# Patient Record
Sex: Male | Born: 1945
Health system: Southern US, Community
[De-identification: ages and names within clinical notes are randomized; demographics above are authoritative.]

## PROBLEM LIST (undated history)

## (undated) DIAGNOSIS — R918 Other nonspecific abnormal finding of lung field: Secondary | ICD-10-CM

## (undated) DIAGNOSIS — F0150 Vascular dementia without behavioral disturbance: Secondary | ICD-10-CM

## (undated) DIAGNOSIS — F3164 Bipolar disorder, current episode mixed, severe, with psychotic features: Secondary | ICD-10-CM

## (undated) DIAGNOSIS — R4189 Other symptoms and signs involving cognitive functions and awareness: Secondary | ICD-10-CM

## (undated) DIAGNOSIS — G20A1 Parkinson's disease without dyskinesia, without mention of fluctuations: Secondary | ICD-10-CM

## (undated) DIAGNOSIS — I10 Essential (primary) hypertension: Secondary | ICD-10-CM

## (undated) DIAGNOSIS — F419 Anxiety disorder, unspecified: Secondary | ICD-10-CM

## (undated) DIAGNOSIS — L570 Actinic keratosis: Secondary | ICD-10-CM

## (undated) DIAGNOSIS — F329 Major depressive disorder, single episode, unspecified: Secondary | ICD-10-CM

## (undated) DIAGNOSIS — J309 Allergic rhinitis, unspecified: Secondary | ICD-10-CM

## (undated) DIAGNOSIS — Z77098 Contact with and (suspected) exposure to other hazardous, chiefly nonmedicinal, chemicals: Secondary | ICD-10-CM

## (undated) DIAGNOSIS — G969 Disorder of central nervous system, unspecified: Secondary | ICD-10-CM

## (undated) DIAGNOSIS — K591 Functional diarrhea: Secondary | ICD-10-CM

## (undated) DIAGNOSIS — G2581 Restless legs syndrome: Secondary | ICD-10-CM

## (undated) DIAGNOSIS — R300 Dysuria: Secondary | ICD-10-CM

## (undated) DIAGNOSIS — G4700 Insomnia, unspecified: Secondary | ICD-10-CM

## (undated) DIAGNOSIS — Z7729 Contact with and (suspected ) exposure to other hazardous substances: Secondary | ICD-10-CM

## (undated) DIAGNOSIS — N400 Enlarged prostate without lower urinary tract symptoms: Secondary | ICD-10-CM

## (undated) DIAGNOSIS — G4730 Sleep apnea, unspecified: Secondary | ICD-10-CM

## (undated) DIAGNOSIS — E785 Hyperlipidemia, unspecified: Secondary | ICD-10-CM

## (undated) DIAGNOSIS — G2571 Drug induced akathisia: Secondary | ICD-10-CM

## (undated) DIAGNOSIS — F32A Depression, unspecified: Secondary | ICD-10-CM

## (undated) HISTORY — PX: TONSILLECTOMY AND ADENOIDECTOMY: SUR1326

## (undated) HISTORY — PX: OTHER SURGICAL HISTORY: SHX169

## (undated) HISTORY — PX: CATARACT EXTRACTION: SUR2

---

## 2011-03-25 ENCOUNTER — Emergency Department (HOSPITAL_COMMUNITY): Payer: Medicare Other

## 2011-03-25 ENCOUNTER — Observation Stay (HOSPITAL_COMMUNITY)
Admission: EM | Admit: 2011-03-25 | Discharge: 2011-03-27 | Disposition: A | Discharge status: Home / Self Care | Payer: Medicare Other | Attending: Internal Medicine | Admitting: Internal Medicine

## 2011-03-25 ENCOUNTER — Encounter: Payer: Self-pay | Admitting: Emergency Medicine

## 2011-03-25 DIAGNOSIS — J4489 Other specified chronic obstructive pulmonary disease: Secondary | ICD-10-CM | POA: Insufficient documentation

## 2011-03-25 DIAGNOSIS — R55 Syncope and collapse: Principal | ICD-10-CM | POA: Diagnosis present

## 2011-03-25 DIAGNOSIS — E785 Hyperlipidemia, unspecified: Secondary | ICD-10-CM | POA: Insufficient documentation

## 2011-03-25 DIAGNOSIS — I10 Essential (primary) hypertension: Secondary | ICD-10-CM | POA: Diagnosis present

## 2011-03-25 DIAGNOSIS — E876 Hypokalemia: Secondary | ICD-10-CM | POA: Diagnosis present

## 2011-03-25 DIAGNOSIS — M5430 Sciatica, unspecified side: Secondary | ICD-10-CM

## 2011-03-25 DIAGNOSIS — J45909 Unspecified asthma, uncomplicated: Secondary | ICD-10-CM | POA: Diagnosis present

## 2011-03-25 DIAGNOSIS — R0602 Shortness of breath: Secondary | ICD-10-CM | POA: Diagnosis present

## 2011-03-25 DIAGNOSIS — J449 Chronic obstructive pulmonary disease, unspecified: Secondary | ICD-10-CM | POA: Insufficient documentation

## 2011-03-25 DIAGNOSIS — R51 Headache: Secondary | ICD-10-CM | POA: Insufficient documentation

## 2011-03-25 DIAGNOSIS — F411 Generalized anxiety disorder: Secondary | ICD-10-CM | POA: Insufficient documentation

## 2011-03-25 DIAGNOSIS — M545 Low back pain, unspecified: Secondary | ICD-10-CM | POA: Insufficient documentation

## 2011-03-25 HISTORY — DX: Hyperlipidemia, unspecified: E78.5

## 2011-03-25 HISTORY — DX: Anxiety disorder, unspecified: F41.9

## 2011-03-25 HISTORY — DX: Essential (primary) hypertension: I10

## 2011-03-25 LAB — CARDIAC PANEL(CRET KIN+CKTOT+MB+TROPI)
CK, MB: 5.9 ng/mL — ABNORMAL HIGH (ref 0.3–4.0)
Relative Index: 2.5 (ref 0.0–2.5)
Relative Index: 2.5 (ref 0.0–2.5)
Total CK: 251 U/L — ABNORMAL HIGH (ref 7–232)
Troponin I: <0.3 ng/mL (ref <0.30)

## 2011-03-25 LAB — DIFFERENTIAL
Eosinophils Absolute: 0.3 10*3/uL (ref 0.0–0.7)
Lymphocytes Relative: 21 % (ref 12–46)
Lymphs Abs: 1.3 10*3/uL (ref 0.7–4.0)
Monocytes Relative: 10 % (ref 3–12)
Neutrophils Relative %: 64 % (ref 43–77)

## 2011-03-25 LAB — CBC
Hemoglobin: 13 g/dL (ref 13.0–17.0)
MCH: 32.2 pg (ref 26.0–34.0)
MCV: 91.3 fL (ref 78.0–100.0)
RBC: 4.04 MIL/uL — ABNORMAL LOW (ref 4.22–5.81)
WBC: 6.2 10*3/uL (ref 4.0–10.5)

## 2011-03-25 LAB — COMPREHENSIVE METABOLIC PANEL
ALT: 20 U/L (ref 0–53)
Alkaline Phosphatase: 62 U/L (ref 39–117)
BUN: 15 mg/dL (ref 6–23)
CO2: 28 mEq/L (ref 19–32)
GFR calc Af Amer: >90 mL/min (ref >90)
GFR calc non Af Amer: 90 mL/min — ABNORMAL LOW (ref >90)
Glucose, Bld: 100 mg/dL — ABNORMAL HIGH (ref 70–99)
Potassium: 3.4 mEq/L — ABNORMAL LOW (ref 3.5–5.1)
Sodium: 137 mEq/L (ref 135–145)
Total Bilirubin: 0.2 mg/dL — ABNORMAL LOW (ref 0.3–1.2)
Total Protein: 6.2 g/dL (ref 6.0–8.3)

## 2011-03-25 LAB — URINALYSIS, ROUTINE W REFLEX MICROSCOPIC
Bilirubin Urine: NEGATIVE
Glucose, UA: NEGATIVE mg/dL
Hgb urine dipstick: NEGATIVE
Specific Gravity, Urine: 1.016 (ref 1.005–1.030)
Urobilinogen, UA: 1 mg/dL (ref 0.0–1.0)
pH: 6.5 (ref 5.0–8.0)

## 2011-03-25 LAB — D-DIMER, QUANTITATIVE: D-Dimer, Quant: 0.26 ug/mL-FEU (ref 0.00–0.48)

## 2011-03-25 LAB — SEDIMENTATION RATE: Sed Rate: 5 mm/hr (ref 0–16)

## 2011-03-25 LAB — POCT I-STAT TROPONIN I: Troponin i, poc: 0 ng/mL (ref 0.00–0.08)

## 2011-03-25 LAB — MAGNESIUM: Magnesium: 2.1 mg/dL (ref 1.5–2.5)

## 2011-03-25 LAB — CALCIUM: Calcium: 9.1 mg/dL (ref 8.4–10.5)

## 2011-03-25 MED ORDER — SODIUM CHLORIDE 0.9 % IV BOLUS (SEPSIS)
1000.0000 mL | Freq: Once | INTRAVENOUS | Status: AC
  Administered 2011-03-25: 1000 mL via INTRAVENOUS

## 2011-03-25 MED ORDER — ALBUTEROL SULFATE (5 MG/ML) 0.5% IN NEBU: 2.5000 mg | INHALATION_SOLUTION | Freq: Four times a day (QID) | RESPIRATORY_TRACT | Status: DC

## 2011-03-25 MED ORDER — ENOXAPARIN SODIUM 40 MG/0.4ML ~~LOC~~ SOLN
40.0000 mg | SUBCUTANEOUS | Status: DC
  Administered 2011-03-25 – 2011-03-26 (×2): 40 mg via SUBCUTANEOUS
  Filled 2011-03-25 (×3): qty 0.4

## 2011-03-25 MED ORDER — ALBUTEROL SULFATE (5 MG/ML) 0.5% IN NEBU: 2.5000 mg | INHALATION_SOLUTION | Freq: Four times a day (QID) | RESPIRATORY_TRACT | Status: DC | PRN

## 2011-03-25 NOTE — ED Notes (Signed)
Per [facility], pt from [facility] for multiple near syncopal episodes over period of 10 days; last one was yesterday.  States has episodes strange feeling in chest ("cessation of heartbeat") and feels faint - lasting 10-12 seconds.  Also has sob with same episodes.

## 2011-03-25 NOTE — ED Notes (Signed)
Lab called with critical CKMB 6.4  MD notified

## 2011-03-25 NOTE — ED Provider Notes (Signed)
History     CSN: [id number] Arrival date & time: 03/25/2011  6:14 PM   First MD Initiated Contact with Patient 03/25/11 1815      Chief Complaint  Patient presents with  . Near Syncope  . Shortness of Breath    (Consider location/radiation/quality/duration/timing/severity/associated sxs/prior treatment) HPI Comments: Patient presents from urgent care for evaluation of her single episodes. He's had 2 episodes over the course of the past 10 days last one last night while sitting watching a movie. He describes a sensation of a strange feeling in his chest feel like his heart is stopping for a few seconds and feeling lightheaded and almost passing out. There is no associated chest pain there is mild shortness of breath these episodes. He denies any diaphoresis, nausea, vomiting, blurry vision, clammy hands. There is no program for the symptoms they come on without warning. The other episode occurred 10 days ago while he was driving. He states he did have a syncopal episode about 4 years ago and was discharged from the emergency room. He follows at the [place] in [place] has a history of hypertension, hyperlipidemia anxiety.  Patient did not actually pass out or hit his head. The episodes last for a few seconds and then he is back to baseline.  The history is provided by the patient, the EMS personnel and the spouse.    Past Medical History  Diagnosis Date  . Hypertension   . Hyperlipemia   . Anxiety     No past surgical history on file.  No family history on file.  History  Substance Use Topics  . Smoking status: Not on file  . Smokeless tobacco: Not on file  . Alcohol Use:       Review of Systems  Constitutional: Positive for fatigue. Negative for fever, activity change and appetite change.  HENT: Negative for congestion and rhinorrhea.   Eyes: Negative for visual disturbance.  Respiratory: Positive for shortness of breath. Negative for chest tightness.   Cardiovascular:  Positive for palpitations. Negative for chest pain.  Gastrointestinal: Negative for nausea and abdominal pain.  Genitourinary: Negative for dysuria and hematuria.  Musculoskeletal: Negative for back pain.  Neurological: Positive for dizziness, syncope and light-headedness. Negative for numbness and headaches.    Allergies  Review of patient's allergies indicates no known allergies.  Home Medications   No current outpatient prescriptions on file.  BP 126/66  Pulse 72  Temp(Src) 98 F (36.7 C) (Oral)  Resp 16  Ht 5\' 10"  (1.778 m)  Wt 157 lb (71.215 kg)  BMI 22.53 kg/m2  SpO2 97%  Physical Exam  Constitutional: He is oriented to person, place, and time. He appears well-developed and well-nourished. No distress.  HENT:  Head: Normocephalic and atraumatic.  Mouth/Throat: Oropharynx is clear and moist. No oropharyngeal exudate.  Eyes: Conjunctivae are normal. Pupils are equal, round, and reactive to light.  Neck: Normal range of motion.  Cardiovascular: Normal rate, regular rhythm and normal heart sounds.   No murmur heard. Pulmonary/Chest: Effort normal and breath sounds normal. No respiratory distress.  Abdominal: Soft. There is no tenderness. There is no rebound and no guarding.  Musculoskeletal: Normal range of motion. He exhibits no edema and no tenderness.  Neurological: He is alert and oriented to person, place, and time. No cranial nerve deficit.  Skin: Skin is warm.    ED Course  Procedures (including critical care time)  Labs Reviewed  CBC - Abnormal; Notable for the following:    RBC 4.04 (*)  HCT 36.9 (*)    All other components within normal limits  COMPREHENSIVE METABOLIC PANEL - Abnormal; Notable for the following:    Potassium 3.4 (*)    Glucose, Bld 100 (*)    Total Bilirubin 0.2 (*)    GFR calc non Af Amer 90 (*)    All other components within normal limits  CARDIAC PANEL(CRET KIN+CKTOT+MB+TROPI) - Abnormal; Notable for the following:    Total CK  251 (*)    CK, MB 6.4 (*)    All other components within normal limits  CARDIAC PANEL(CRET KIN+CKTOT+MB+TROPI) - Abnormal; Notable for the following:    Total CK 234 (*)    CK, MB 5.9 (*)    All other components within normal limits  DIFFERENTIAL  D-DIMER, QUANTITATIVE  URINALYSIS, ROUTINE W REFLEX MICROSCOPIC  POCT I-STAT TROPONIN I  CALCIUM  MAGNESIUM  D-DIMER, QUANTITATIVE  SEDIMENTATION RATE  I-STAT TROPONIN I  CARDIAC PANEL(CRET KIN+CKTOT+MB+TROPI)  CARDIAC PANEL(CRET KIN+CKTOT+MB+TROPI)  TSH  BASIC METABOLIC PANEL   Dg Chest 2 View  03/25/2011  *RADIOLOGY REPORT*  Clinical Data: Syncope an irregular heart beat.  Shortness of breath.  CHEST - 2 VIEW  Comparison: None.  Findings: Slight flattening of the hemidiaphragms.  The lungs are clear.  No airspace disease effusion, or pneumothorax.  The heart, mediastinal, and hilar contours are within normal limits.  IMPRESSION: Mild hyperinflation. This could be related to asthma or COPD. Otherwise, no acute findings.  Original Report Authenticated By: Britta Mccreedy, M.D.   Ct Head Wo Contrast  03/25/2011  *RADIOLOGY REPORT*  Clinical Data:  Near-syncope  CT HEAD WITHOUT CONTRAST  Technique:  Contiguous axial images were obtained from the base of the skull through the vertex without contrast  Comparison:  None.  Findings:  The brain has a normal appearance without evidence for hemorrhage, acute infarction, hydrocephalus, or mass lesion. There is mild age related atrophy.  There is no extra axial fluid collection.  The skull is normal.  There is mild chronic ethmoid paranasal sinus disease.  No mastoid fluid is seen although chronic mastoiditis is suspected on the right.  IMPRESSION:       Unremarkable CT head without contrast.  No acute intracranial findings.  Original Report Authenticated By: Elsie Stain, M.D.     1. Near syncope       MDM  Near syncopal episodes with position of heart skipping beat without prodrome symptoms.  Patient is in no acute distress at this time with stable vitals. His description of the symptoms is concerning for cardiogenic syncope.  We'll initiate workup with labs, EKG, orthostatic vitals.    Date: 03/25/2011  Rate: 67  Rhythm: normal sinus rhythm  QRS Axis: normal  Intervals: normal  ST/T Wave abnormalities: normal  Conduction Disutrbances:none  Narrative Interpretation:   Old EKG Reviewed: none available   First set of cardiac enzymes negative, d-dimer negative. Patient has remained stable without arrhythmias.  Will need admission to evaluate for cardiac syncope with echocardiogram, EEG and likely event monitor.    Glynn Octave, MD 03/25/11 [phone]

## 2011-03-25 NOTE — H&P (Signed)
Matthew Patrick MRN: [id number] DOB/AGE: [birth year] 65 y.o. Primary Care Physician:No primary provider on file. Admit date: 03/25/2011  Primary care provider [facility], he is also trying to establish with [facility], Dr. Vassie Loll, but has not been seen by them. Chief Complaint: Syncope HPI: 65 year-old male with a history of asthma, hypertension, who presents to the ER, with 2 separate episodes of syncope and near-syncope. The syncopal episode was 10 days ago, when the patient was at a stoplight, and he passed out. The near-syncopal episode was yesterday, when the patient was in a movie theater. He describes them "As if his heart has stopped for 10-15 seconds," during which he has difficulty catching her breath. He does not have any associated chest pain, but has experienced shortness of breath. He has been short of breath, on and off for the last several months. He also states that he had a TIA-like episode for which she was hospitalized recently. He denies any orthopnea, paroxysmal nocturnal dyspnea, dependent edema. He denies any recent episodes of fever chills or rigors, he denies any history of vertigo.  His syncopal episode is preceded with lightheadedness. He denies been started on any new medications. Denies any history of seizure disorder. Denies any recent history of travel, but did travel to [place] a month ago. He states that he has had some stressors in his life, over the course of the last few months. He has had some global headache, and pain behind his left eye,  For the last several months.  Past Medical History  Diagnosis Date  . Hypertension   . Hyperlipemia   . Anxiety    chronic low back pain Asthma History of TIA   No past surgical history on file.  Prior to Admission medications   Medication Sig Start Date End Date Taking? Authorizing Provider  aspirin 81 MG chewable tablet Chew 81 mg by mouth daily.     Yes Historical Provider, MD  cetirizine (ZYRTEC) 10 MG tablet Take 10 mg by  mouth daily.     Yes Historical Provider, MD  hydrochlorothiazide (HYDRODIURIL) 12.5 MG tablet Take 12.5 mg by mouth daily.     Yes Historical Provider, MD  ibuprofen (ADVIL,MOTRIN) 600 MG tablet Take 600 mg by mouth every 6 (six) hours as needed. For headache    Yes Historical Provider, MD  lisinopril-hydrochlorothiazide (PRINZIDE,ZESTORETIC) 10-12.5 MG per tablet Take 1 tablet by mouth daily.     Yes Historical Provider, MD  LORazepam (ATIVAN) 0.5 MG tablet Take 0.5 mg by mouth every 8 (eight) hours. For anxiety    Yes Historical Provider, MD  mirtazapine (REMERON) 7.5 MG tablet Take 7.5 mg by mouth at bedtime. Unknown dose per pt   Yes Historical Provider, MD  rosuvastatin (CRESTOR) 5 MG tablet Take 5 mg by mouth daily. Unknown dose per pt    Yes Historical Provider, MD  sertraline (ZOLOFT) 25 MG tablet Take 25 mg by mouth daily. Unknown dose per pt    Yes Historical Provider, MD    Allergies: No Known Allergies  No family history on file.  Social History:  He smoked up until the age of 65. He drinks alcohol on a regular basis, one to 2 glasses of wine per night.    ROS: A complete 14 point review of systems was done, as documented in history of present illness.  PHYSICAL EXAM: Blood pressure 141/76, pulse 70, temperature 98.3 F (36.8 C), temperature source Oral, resp. rate 16, SpO2 98.00%.  No results found for this  or any previous visit (from the past 240 hour(s)).  Head: Normocephalic and atraumatic.  Right Ear: External ear normal.  Left Ear: External ear normal.  Nose: Nose normal.  Oropharynx clear. Mucous membranes dry.  Eyes: EOM are normal. Pupils are equal, round, and reactive to light.  Neck: Normal range of motion. Neck supple.  Cardiovascular: Normal rate, regular rhythm and normal heart sounds.  No murmur heard.  Pulmonary/Chest: Effort normal and breath sounds normal. No respiratory distress. He has no wheezes. He exhibits no tenderness.  Abdominal: Soft. Bowel  sounds are normal.  . He exhibits no mass. There is no tenderness. There is no rebound and no guarding.  Genitourinary: Rectal exam shows no tenderness and anal tone normal.   Musculoskeletal: Normal range of motion. He exhibits no edema and no tenderness.  Lymphadenopathy:  He has no cervical adenopathy.  Neurological: He is alert and oriented to person, place, and time. No cranial nerve deficit. Coordination normal.  Skin: Skin is warm and dry. No rash noted.   Results for orders placed during the hospital encounter of 03/25/11 (from the past 48 hour(s))  CBC     Status: Abnormal   Collection Time   03/25/11  6:30 PM      Component Value Range Comment   WBC 6.2  4.0 - 10.5 (K/uL)    RBC 4.04 (*) 4.22 - 5.81 (MIL/uL)    Hemoglobin 13.0  13.0 - 17.0 (g/dL)    HCT 16.1 (*) 09.6 - 52.0 (%)    MCV 91.3  78.0 - 100.0 (fL)    MCH 32.2  26.0 - 34.0 (pg)    MCHC 35.2  30.0 - 36.0 (g/dL)    RDW 04.5  40.9 - 81.1 (%)    Platelets 206  150 - 400 (K/uL)   DIFFERENTIAL     Status: Normal   Collection Time   03/25/11  6:30 PM      Component Value Range Comment   Neutrophils Relative 64  43 - 77 (%)    Neutro Abs 4.0  1.7 - 7.7 (K/uL)    Lymphocytes Relative 21  12 - 46 (%)    Lymphs Abs 1.3  0.7 - 4.0 (K/uL)    Monocytes Relative 10  3 - 12 (%)    Monocytes Absolute 0.6  0.1 - 1.0 (K/uL)    Eosinophils Relative 5  0 - 5 (%)    Eosinophils Absolute 0.3  0.0 - 0.7 (K/uL)    Basophils Relative 1  0 - 1 (%)    Basophils Absolute 0.0  0.0 - 0.1 (K/uL)   COMPREHENSIVE METABOLIC PANEL     Status: Abnormal   Collection Time   03/25/11  6:30 PM      Component Value Range Comment   Sodium 137  135 - 145 (mEq/L)    Potassium 3.4 (*) 3.5 - 5.1 (mEq/L)    Chloride 102  96 - 112 (mEq/L)    CO2 28  19 - 32 (mEq/L)    Glucose, Bld 100 (*) 70 - 99 (mg/dL)    BUN 15  6 - 23 (mg/dL)    Creatinine, Ser 9.14  0.50 - 1.35 (mg/dL)    Calcium 9.0  8.4 - 10.5 (mg/dL)    Total Protein 6.2  6.0 - 8.3 (g/dL)     Albumin 3.7  3.5 - 5.2 (g/dL)    AST 19  0 - 37 (U/L)    ALT 20  0 - 53 (U/L)  Alkaline Phosphatase 62  39 - 117 (U/L)    Total Bilirubin 0.2 (*) 0.3 - 1.2 (mg/dL)    GFR calc non Af Amer 90 (*) >90 (mL/min)    GFR calc Af Amer >90  >90 (mL/min)   CARDIAC PANEL(CRET KIN+CKTOT+MB+TROPI)     Status: Abnormal   Collection Time   03/25/11  6:30 PM      Component Value Range Comment   Total CK 251 (*) 7 - 232 (U/L)    CK, MB 6.4 (*) 0.3 - 4.0 (ng/mL)    Troponin I <0.30  <0.30 (ng/mL)    Relative Index 2.5  0.0 - 2.5    D-DIMER, QUANTITATIVE     Status: Normal   Collection Time   03/25/11  6:30 PM      Component Value Range Comment   D-Dimer, Quant 0.34  0.00 - 0.48 (ug/mL-FEU)   POCT I-STAT TROPONIN I     Status: Normal   Collection Time   03/25/11  6:59 PM      Component Value Range Comment   Troponin i, poc 0.00  0.00 - 0.08 (ng/mL)    Comment 3            URINALYSIS, ROUTINE W REFLEX MICROSCOPIC     Status: Normal   Collection Time   03/25/11  7:33 PM      Component Value Range Comment   Color, Urine YELLOW  YELLOW     APPearance CLEAR  CLEAR     Specific Gravity, Urine 1.016  1.005 - 1.030     pH 6.5  5.0 - 8.0     Glucose, UA NEGATIVE  NEGATIVE (mg/dL)    Hgb urine dipstick NEGATIVE  NEGATIVE     Bilirubin Urine NEGATIVE  NEGATIVE     Ketones, ur NEGATIVE  NEGATIVE (mg/dL)    Protein, ur NEGATIVE  NEGATIVE (mg/dL)    Urobilinogen, UA 1.0  0.0 - 1.0 (mg/dL)    Nitrite NEGATIVE  NEGATIVE     Leukocytes, UA NEGATIVE  NEGATIVE  MICROSCOPIC NOT DONE ON URINES WITH NEGATIVE PROTEIN, BLOOD, LEUKOCYTES, NITRITE, OR GLUCOSE <1000 mg/dL.    Ct Head Wo Contrast  03/25/2011   IMPRESSION:       Unremarkable CT head without contrast.  No acute intracranial findings.  Original Report Authenticated By: Elsie Stain, M.D.    Impression:     #1 Syncope the patient will be admitted to telemetry, his symptoms are concerning for cardiac etiology. I doubt that he's had a TIA. He will be  evaluated by cardiology during this admission, possibly would need an event monitor, if no arrhythmias are noted. We will also do a 2-D echo carotid Doppler and an EEG.  #2 Shortness of breath and check a d-dimer to rule out a PE in the setting of a syncopal episode  #3 Hypertension today with his lisinopril and HCTZ   #4 Hypokalemia likely secondary to HCTZ of a replete  #5 Asthma Will use when necessary nebulizers.  #6 Sciatica currently stable  #7 headache we'll check an ESR. Evaluate for temporal arteritis. The CT of the head without contrast is negative.        Jaylinn Hellenbrand 03/25/2011, 9:13 PM

## 2011-03-26 ENCOUNTER — Encounter (HOSPITAL_COMMUNITY): Payer: Self-pay | Admitting: *Deleted

## 2011-03-26 DIAGNOSIS — R55 Syncope and collapse: Secondary | ICD-10-CM

## 2011-03-26 DIAGNOSIS — I6350 Cerebral infarction due to unspecified occlusion or stenosis of unspecified cerebral artery: Secondary | ICD-10-CM

## 2011-03-26 LAB — BASIC METABOLIC PANEL
BUN: 13 mg/dL (ref 6–23)
Chloride: 104 mEq/L (ref 96–112)
Creatinine, Ser: 0.88 mg/dL (ref 0.50–1.35)
GFR calc Af Amer: >90 mL/min (ref >90)
GFR calc non Af Amer: 88 mL/min — ABNORMAL LOW (ref >90)

## 2011-03-26 LAB — LIPID PANEL
Cholesterol: 124 mg/dL (ref 0–200)
HDL: 45 mg/dL (ref >39)
LDL Cholesterol: 43 mg/dL (ref 0–99)
LDL Cholesterol: 46 mg/dL (ref 0–99)
Total CHOL/HDL Ratio: 2.9 RATIO
Triglycerides: 181 mg/dL — ABNORMAL HIGH (ref <150)

## 2011-03-26 LAB — CARDIAC PANEL(CRET KIN+CKTOT+MB+TROPI)
CK, MB: 5.6 ng/mL — ABNORMAL HIGH (ref 0.3–4.0)
Troponin I: <0.3 ng/mL (ref <0.30)
Troponin I: <0.3 ng/mL (ref <0.30)

## 2011-03-26 LAB — HEMOGLOBIN A1C: Hgb A1c MFr Bld: 5.7 % — ABNORMAL HIGH (ref <5.7)

## 2011-03-26 LAB — TSH: TSH: 1.071 u[IU]/mL (ref 0.350–4.500)

## 2011-03-26 MED ORDER — HYDROCHLOROTHIAZIDE 25 MG PO TABS: 12.5000 mg | ORAL_TABLET | Freq: Every day | ORAL | Status: DC

## 2011-03-26 MED ORDER — LISINOPRIL 10 MG PO TABS
10.0000 mg | ORAL_TABLET | Freq: Every day | ORAL | Status: DC
  Administered 2011-03-26 – 2011-03-27 (×2): 10 mg via ORAL
  Filled 2011-03-26 (×2): qty 1

## 2011-03-26 MED ORDER — POTASSIUM CHLORIDE CRYS ER 20 MEQ PO TBCR
20.0000 meq | EXTENDED_RELEASE_TABLET | Freq: Two times a day (BID) | ORAL | Status: AC
  Administered 2011-03-26 (×2): 20 meq via ORAL
  Filled 2011-03-26 (×2): qty 1

## 2011-03-26 MED ORDER — ONDANSETRON HCL 4 MG/2ML IJ SOLN: 4.0000 mg | Freq: Four times a day (QID) | INTRAMUSCULAR | Status: DC | PRN

## 2011-03-26 MED ORDER — ENOXAPARIN SODIUM 40 MG/0.4ML ~~LOC~~ SOLN: 40.0000 mg | Freq: Every day | SUBCUTANEOUS | Status: DC

## 2011-03-26 MED ORDER — MIRTAZAPINE 7.5 MG PO TABS
7.5000 mg | ORAL_TABLET | Freq: Every day | ORAL | Status: DC
  Administered 2011-03-26: 7.5 mg via ORAL
  Filled 2011-03-26 (×3): qty 1

## 2011-03-26 MED ORDER — LORATADINE 10 MG PO TABS
10.0000 mg | ORAL_TABLET | Freq: Every day | ORAL | Status: DC
  Administered 2011-03-26 – 2011-03-27 (×2): 10 mg via ORAL
  Filled 2011-03-26 (×2): qty 1

## 2011-03-26 MED ORDER — ASPIRIN 81 MG PO CHEW
81.0000 mg | CHEWABLE_TABLET | Freq: Every day | ORAL | Status: DC
  Administered 2011-03-26 – 2011-03-27 (×2): 81 mg via ORAL
  Filled 2011-03-26 (×2): qty 1

## 2011-03-26 MED ORDER — HYDROCHLOROTHIAZIDE 25 MG PO TABS
25.0000 mg | ORAL_TABLET | Freq: Every day | ORAL | Status: DC
  Administered 2011-03-26 – 2011-03-27 (×2): 25 mg via ORAL
  Filled 2011-03-26 (×2): qty 1

## 2011-03-26 MED ORDER — LORAZEPAM 0.5 MG PO TABS
0.5000 mg | ORAL_TABLET | Freq: Three times a day (TID) | ORAL | Status: DC
  Administered 2011-03-26 – 2011-03-27 (×5): 0.5 mg via ORAL
  Filled 2011-03-26 (×5): qty 1

## 2011-03-26 MED ORDER — ACETAMINOPHEN 650 MG RE SUPP: 650.0000 mg | RECTAL | Status: DC | PRN

## 2011-03-26 MED ORDER — LISINOPRIL-HYDROCHLOROTHIAZIDE 10-12.5 MG PO TABS: 1.0000 | ORAL_TABLET | Freq: Every day | ORAL | Status: DC

## 2011-03-26 MED ORDER — SODIUM CHLORIDE 0.9 % IV SOLN
INTRAVENOUS | Status: DC
  Administered 2011-03-26: 03:00:00 via INTRAVENOUS

## 2011-03-26 MED ORDER — SENNOSIDES-DOCUSATE SODIUM 8.6-50 MG PO TABS
1.0000 | ORAL_TABLET | Freq: Every evening | ORAL | Status: DC | PRN
  Filled 2011-03-26: qty 1

## 2011-03-26 MED ORDER — ENOXAPARIN SODIUM 40 MG/0.4ML ~~LOC~~ SOLN: 40.0000 mg | SUBCUTANEOUS | Status: DC

## 2011-03-26 MED ORDER — HYDROCHLOROTHIAZIDE 12.5 MG PO CAPS
12.5000 mg | ORAL_CAPSULE | Freq: Every day | ORAL | Status: DC
  Filled 2011-03-26: qty 1

## 2011-03-26 MED ORDER — SERTRALINE HCL 25 MG PO TABS
25.0000 mg | ORAL_TABLET | Freq: Every day | ORAL | Status: DC
  Administered 2011-03-26 – 2011-03-27 (×2): 25 mg via ORAL
  Filled 2011-03-26 (×2): qty 1

## 2011-03-26 MED ORDER — ROSUVASTATIN CALCIUM 5 MG PO TABS
5.0000 mg | ORAL_TABLET | Freq: Every day | ORAL | Status: DC
  Administered 2011-03-26 – 2011-03-27 (×2): 5 mg via ORAL
  Filled 2011-03-26 (×2): qty 1

## 2011-03-26 MED ORDER — ACETAMINOPHEN 325 MG PO TABS: 650.0000 mg | ORAL_TABLET | ORAL | Status: DC | PRN
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
## 2011-03-26 NOTE — Progress Notes (Signed)
Subjective: Feeling better today.  No specific concerns.  Objective: Vital signs in last 24 hours: Filed Vitals:   03/26/11 0200 03/26/11 0400 03/26/11 0600 03/26/11 1000  BP: 104/57 99/58 104/66 111/65  Pulse: 69 66 62 63  Temp:   97.6 F (36.4 C) 97.6 F (36.4 C)  TempSrc:   Oral Oral  Resp:   16 18  Height:      Weight:      SpO2:   96% 96%   Weight change:   Intake/Output Summary (Last 24 hours) at 03/26/11 1257 Last data filed at 03/26/11 0830  Gross per 24 hour  Intake    390 ml  Output    600 ml  Net   -210 ml   Physical Exam: General: Awake, Oriented, No acute distress. HEENT: EOMI. Neck: Supple CV: S1 and S2 Lungs: Clear to ascultation bilaterally Abdomen: Soft, Nontender, Nondistended, +bowel sounds. Ext: Good pulses. Trace edema.   Lab Results:  [facility] 03/26/11 0500 03/25/11 2104 03/25/11 1830  NA 135 -- 137  K 3.7 -- 3.4*  CL 104 -- 102  CO2 25 -- 28  GLUCOSE 83 -- 100*  BUN 13 -- 15  CREATININE 0.88 -- 0.84  CALCIUM 8.7 9.1 --  MG -- 2.1 --  PHOS -- -- --    Basename 03/25/11 1830  AST 19  ALT 20  ALKPHOS 62  BILITOT 0.2*  PROT 6.2  ALBUMIN 3.7   No results found for this basename: LIPASE:2,AMYLASE:2 in the last 72 hours  Basename 03/25/11 1830  WBC 6.2  NEUTROABS 4.0  HGB 13.0  HCT 36.9*  MCV 91.3  PLT 206    Basename 03/26/11 0530 03/25/11 2328 03/25/11 2057  CKTOTAL 208 226 234*  CKMB 5.5* 5.6* 5.9*  CKMBINDEX -- -- --  TROPONINI <0.30 <0.30 <0.30   No results found for this basename: POCBNP:3 in the last 72 hours  Basename 03/25/11 2058 03/25/11 1830  DDIMER 0.26 0.34    Basename 03/26/11 0500  HGBA1C 5.7*    Basename 03/26/11 0500  CHOL 124126  HDL 4544  LDLCALC 4346  TRIG 181*178*  CHOLHDL 2.82.9  LDLDIRECT --    Basename 03/25/11 2104  TSH 1.071  T4TOTAL --  T3FREE --  THYROIDAB --   No results found for this basename: VITAMINB12:2,FOLATE:2,FERRITIN:2,TIBC:2,IRON:2,RETICCTPCT:2 in the last  72 hours  Micro Results: No results found for this or any previous visit (from the past 240 hour(s)).  Studies/Results: Dg Chest 2 View  03/25/2011  *RADIOLOGY REPORT*  Clinical Data: Syncope an irregular heart beat.  Shortness of breath.  CHEST - 2 VIEW  Comparison: None.  Findings: Slight flattening of the hemidiaphragms.  The lungs are clear.  No airspace disease effusion, or pneumothorax.  The heart, mediastinal, and hilar contours are within normal limits.  IMPRESSION: Mild hyperinflation. This could be related to asthma or COPD. Otherwise, no acute findings.  Original Report Authenticated By: Britta Mccreedy, M.D.   Ct Head Wo Contrast  03/25/2011  *RADIOLOGY REPORT*  Clinical Data:  Near-syncope  CT HEAD WITHOUT CONTRAST  Technique:  Contiguous axial images were obtained from the base of the skull through the vertex without contrast  Comparison:  None.  Findings:  The brain has a normal appearance without evidence for hemorrhage, acute infarction, hydrocephalus, or mass lesion. There is mild age related atrophy.  There is no extra axial fluid collection.  The skull is normal.  There is mild chronic ethmoid paranasal sinus disease.  No mastoid fluid  is seen although chronic mastoiditis is suspected on the right.  IMPRESSION:       Unremarkable CT head without contrast.  No acute intracranial findings.  Original Report Authenticated By: Elsie Stain, M.D.    Medications: I have reviewed the patient's current medications. Scheduled Meds:   . aspirin  81 mg Oral Daily  . enoxaparin  40 mg Subcutaneous Q24H  . hydrochlorothiazide  25 mg Oral Daily  . lisinopril  10 mg Oral Daily  . loratadine  10 mg Oral Daily  . LORazepam  0.5 mg Oral Q8H  . mirtazapine  7.5 mg Oral QHS  . potassium chloride  20 mEq Oral BID  . rosuvastatin  5 mg Oral Daily  . sertraline  25 mg Oral Daily  . sodium chloride  1,000 mL Intravenous Once  . DISCONTD: albuterol  2.5 mg Nebulization Q6H  . DISCONTD:  enoxaparin  40 mg Subcutaneous Q24H  . DISCONTD: enoxaparin (LOVENOX) injection  40 mg Subcutaneous Daily  . DISCONTD: hydrochlorothiazide  12.5 mg Oral Daily  . DISCONTD: hydrochlorothiazide  12.5 mg Oral Daily  . DISCONTD: lisinopril-hydrochlorothiazide  1 tablet Oral Daily   Continuous Infusions:   . DISCONTD: sodium chloride 75 mL/hr at 03/26/11 0300   PRN Meds:.acetaminophen, acetaminophen, albuterol, ondansetron (ZOFRAN) IV, senna-docusate  Assessment/Plan: 1. Syncope.  Etiology unclear.  Patient had a head CT with results as indicated above.  D-dimer normal, indicating low likely hood for thromboembolic event.  2-D echocardiogram ordered and pending.  Bilateral carotid Dopplers showed no significant ICA stenosis.  Cardiology will perhaps consult electrophysiology for further rhythm evaluation.  EEG canceled.  Patient had orthostatics checked and was not orthostatic, patient may have received fluids in the ER.  2.  Shortness of breath.  Resolved.  D-dimer normal.  3.  Hypertension.  Stable continue hydrochlorothiazide and lisinopril.  4.  Hypokalemia.  Resolved with replacement.  5.  History of asthma.  Stable.  6.  History of sciatica.  Stable.  7.  Headache.  Resolved.  ESR normal.  Head CT negative.  8.  Prophylaxis.  Lovenox for DVT prophylaxis.  9.  Disposition.  Pending 2-D echocardiogram and cardiac evaluation.   LOS: 1 day  Indya Oliveria A, MD 03/26/2011, 12:57 PM
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
## 2011-03-26 NOTE — Progress Notes (Signed)
Bilateral carotid artery duplex completed at 09:00.  Preliminary report is no evidence of significant ICA stenosis. Smiley Houseman 03/26/2011, 9:25 AM

## 2011-03-27 ENCOUNTER — Ambulatory Visit (HOSPITAL_COMMUNITY): Payer: Medicare Other

## 2011-03-27 DIAGNOSIS — R55 Syncope and collapse: Secondary | ICD-10-CM

## 2011-03-27 HISTORY — PX: OTHER SURGICAL HISTORY: SHX169
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
## 2011-03-27 NOTE — Progress Notes (Signed)
I discharged Mr. Matthew Patrick. I gave him discharge instructions on current medications, and new medications for administration. I also gave him information on his follow-up appointment and consult for cardiac monitor.  I gave him information on community resources, as well as stroke protocol. I removed his intravenous catheter. Discharge was successful.

## 2011-03-27 NOTE — Discharge Summary (Signed)
Discharge Summary  Handy Mcloud MR#: [id number]  DOB:1946/02/08  Date of Admission: 03/25/2011 Date of Discharge: 03/27/2011  Patient's PCP: Reports it is the [place].  Attending Physician:Yariana Hoaglund A  Consults: Dr. Ladona Ridgel - [facility]  Discharge Diagnoses: Principal Problem:  *Syncope Active Problems:  Hypertension  Hypokalemia  Asthma  Sciatica  SOB (shortness of breath)  Brief Admitting History and Physical 65 year old male with history of asthma, hypertension, syncope and near syncope who presented on December 1 of 2012 with syncope.  Discharge Medications Current Discharge Medication List    CONTINUE these medications which have NOT CHANGED   Details  aspirin 81 MG chewable tablet Chew 81 mg by mouth daily.      cetirizine (ZYRTEC) 10 MG tablet Take 10 mg by mouth daily.      hydrochlorothiazide (HYDRODIURIL) 12.5 MG tablet Take 12.5 mg by mouth daily.      ibuprofen (ADVIL,MOTRIN) 600 MG tablet Take 600 mg by mouth every 6 (six) hours as needed. For headache     lisinopril-hydrochlorothiazide (PRINZIDE,ZESTORETIC) 10-12.5 MG per tablet Take 1 tablet by mouth daily.      LORazepam (ATIVAN) 0.5 MG tablet Take 0.5 mg by mouth every 8 (eight) hours. For anxiety     mirtazapine (REMERON) 7.5 MG tablet Take 7.5 mg by mouth at bedtime. Unknown dose per pt    rosuvastatin (CRESTOR) 5 MG tablet Take 5 mg by mouth daily. Unknown dose per pt     sertraline (ZOLOFT) 25 MG tablet Take 25 mg by mouth daily. Unknown dose per pt         Hospital Course: 1. Syncope. Etiology unclear. Patient had a head CT with results as indicated below. D-dimer normal, indicating low likely hood for thromboembolic event. 2-D echocardiogram as indicated below. Bilateral carotid Dopplers showed no significant ICA stenosis. Cardiology recommended event monitor as outpatient. EEG canceled patient's symptoms were thought to be cardiac by history rather than neurologic. Patient had orthostatics checked  and was not orthostatic.   2. Shortness of breath. Resolved. D-dimer normal, indicating low likelihood for thromboembolic event.   3. Hypertension. Stable continue hydrochlorothiazide and lisinopril.   4. Hypokalemia. Resolved with replacement.   5. History of asthma. Stable.   6. History of sciatica. Stable.   7. Headache. Resolved. ESR normal, it was checked to determine the patient had temporal arteritis. Head CT negative.   Day of Discharge BP 131/99  Pulse 108  Temp(Src) 97.6 F (36.4 C) (Oral)  Resp 16  Ht 5\' 10"  (1.778 m)  Wt 71.215 kg (157 lb)  BMI 22.53 kg/m2  SpO2 99%  Results for orders placed during the hospital encounter of 03/25/11 (from the past 48 hour(s))  CBC     Status: Abnormal   Collection Time   03/25/11  6:30 PM      Component Value Range Comment   WBC 6.2  4.0 - 10.5 (K/uL)    RBC 4.04 (*) 4.22 - 5.81 (MIL/uL)    Hemoglobin 13.0  13.0 - 17.0 (g/dL)    HCT 47.8 (*) 29.5 - 52.0 (%)    MCV 91.3  78.0 - 100.0 (fL)    MCH 32.2  26.0 - 34.0 (pg)    MCHC 35.2  30.0 - 36.0 (g/dL)    RDW 62.1  30.8 - 65.7 (%)    Platelets 206  150 - 400 (K/uL)   DIFFERENTIAL     Status: Normal   Collection Time   03/25/11  6:30 PM  Component Value Range Comment   Neutrophils Relative 64  43 - 77 (%)    Neutro Abs 4.0  1.7 - 7.7 (K/uL)    Lymphocytes Relative 21  12 - 46 (%)    Lymphs Abs 1.3  0.7 - 4.0 (K/uL)    Monocytes Relative 10  3 - 12 (%)    Monocytes Absolute 0.6  0.1 - 1.0 (K/uL)    Eosinophils Relative 5  0 - 5 (%)    Eosinophils Absolute 0.3  0.0 - 0.7 (K/uL)    Basophils Relative 1  0 - 1 (%)    Basophils Absolute 0.0  0.0 - 0.1 (K/uL)   COMPREHENSIVE METABOLIC PANEL     Status: Abnormal   Collection Time   03/25/11  6:30 PM      Component Value Range Comment   Sodium 137  135 - 145 (mEq/L)    Potassium 3.4 (*) 3.5 - 5.1 (mEq/L)    Chloride 102  96 - 112 (mEq/L)    CO2 28  19 - 32 (mEq/L)    Glucose, Bld 100 (*) 70 - 99 (mg/dL)    BUN 15  6 -  23 (mg/dL)    Creatinine, Ser 1.61  0.50 - 1.35 (mg/dL)    Calcium 9.0  8.4 - 10.5 (mg/dL)    Total Protein 6.2  6.0 - 8.3 (g/dL)    Albumin 3.7  3.5 - 5.2 (g/dL)    AST 19  0 - 37 (U/L)    ALT 20  0 - 53 (U/L)    Alkaline Phosphatase 62  39 - 117 (U/L)    Total Bilirubin 0.2 (*) 0.3 - 1.2 (mg/dL)    GFR calc non Af Amer 90 (*) >90 (mL/min)    GFR calc Af Amer >90  >90 (mL/min)   CARDIAC PANEL(CRET KIN+CKTOT+MB+TROPI)     Status: Abnormal   Collection Time   03/25/11  6:30 PM      Component Value Range Comment   Total CK 251 (*) 7 - 232 (U/L)    CK, MB 6.4 (*) 0.3 - 4.0 (ng/mL)    Troponin I <0.30  <0.30 (ng/mL)    Relative Index 2.5  0.0 - 2.5    D-DIMER, QUANTITATIVE     Status: Normal   Collection Time   03/25/11  6:30 PM      Component Value Range Comment   D-Dimer, Quant 0.34  0.00 - 0.48 (ug/mL-FEU)   POCT I-STAT TROPONIN I     Status: Normal   Collection Time   03/25/11  6:59 PM      Component Value Range Comment   Troponin i, poc 0.00  0.00 - 0.08 (ng/mL)    Comment 3            URINALYSIS, ROUTINE W REFLEX MICROSCOPIC     Status: Normal   Collection Time   03/25/11  7:33 PM      Component Value Range Comment   Color, Urine YELLOW  YELLOW     APPearance CLEAR  CLEAR     Specific Gravity, Urine 1.016  1.005 - 1.030     pH 6.5  5.0 - 8.0     Glucose, UA NEGATIVE  NEGATIVE (mg/dL)    Hgb urine dipstick NEGATIVE  NEGATIVE     Bilirubin Urine NEGATIVE  NEGATIVE     Ketones, ur NEGATIVE  NEGATIVE (mg/dL)    Protein, ur NEGATIVE  NEGATIVE (mg/dL)    Urobilinogen, UA 1.0  0.0 - 1.0 (mg/dL)    Nitrite NEGATIVE  NEGATIVE     Leukocytes, UA NEGATIVE  NEGATIVE  MICROSCOPIC NOT DONE ON URINES WITH NEGATIVE PROTEIN, BLOOD, LEUKOCYTES, NITRITE, OR GLUCOSE <1000 mg/dL.  CARDIAC PANEL(CRET KIN+CKTOT+MB+TROPI)     Status: Abnormal   Collection Time   03/25/11  8:57 PM      Component Value Range Comment   Total CK 234 (*) 7 - 232 (U/L)    CK, MB 5.9 (*) 0.3 - 4.0 (ng/mL)     Troponin I <0.30  <0.30 (ng/mL)    Relative Index 2.5  0.0 - 2.5    D-DIMER, QUANTITATIVE     Status: Normal   Collection Time   03/25/11  8:58 PM      Component Value Range Comment   D-Dimer, Quant 0.26  0.00 - 0.48 (ug/mL-FEU)   SEDIMENTATION RATE     Status: Normal   Collection Time   03/25/11  8:58 PM      Component Value Range Comment   Sed Rate 5  0 - 16 (mm/hr)   TSH     Status: Normal   Collection Time   03/25/11  9:04 PM      Component Value Range Comment   TSH 1.071  0.350 - 4.500 (uIU/mL)   CALCIUM     Status: Normal   Collection Time   03/25/11  9:04 PM      Component Value Range Comment   Calcium 9.1  8.4 - 10.5 (mg/dL)   MAGNESIUM     Status: Normal   Collection Time   03/25/11  9:04 PM      Component Value Range Comment   Magnesium 2.1  1.5 - 2.5 (mg/dL)   CARDIAC PANEL(CRET KIN+CKTOT+MB+TROPI)     Status: Abnormal   Collection Time   03/25/11 11:28 PM      Component Value Range Comment   Total CK 226  7 - 232 (U/L)    CK, MB 5.6 (*) 0.3 - 4.0 (ng/mL)    Troponin I <0.30  <0.30 (ng/mL)    Relative Index 2.5  0.0 - 2.5    BASIC METABOLIC PANEL     Status: Abnormal   Collection Time   03/26/11  5:00 AM      Component Value Range Comment   Sodium 135  135 - 145 (mEq/L)    Potassium 3.7  3.5 - 5.1 (mEq/L)    Chloride 104  96 - 112 (mEq/L)    CO2 25  19 - 32 (mEq/L)    Glucose, Bld 83  70 - 99 (mg/dL)    BUN 13  6 - 23 (mg/dL)    Creatinine, Ser 4.78  0.50 - 1.35 (mg/dL)    Calcium 8.7  8.4 - 10.5 (mg/dL)    GFR calc non Af Amer 88 (*) >90 (mL/min)    GFR calc Af Amer >90  >90 (mL/min)   HEMOGLOBIN A1C     Status: Abnormal   Collection Time   03/26/11  5:00 AM      Component Value Range Comment   Hemoglobin A1C 5.7 (*) <5.7 (%)    Mean Plasma Glucose 117 (*) <117 (mg/dL)   LIPID PANEL     Status: Abnormal   Collection Time   03/26/11  5:00 AM      Component Value Range Comment   Cholesterol 124  0 - 200 (mg/dL)    Triglycerides 295 (*) <150 (mg/dL)    HDL  45  >  39 (mg/dL)    Total CHOL/HDL Ratio 2.8      VLDL 36  0 - 40 (mg/dL)    LDL Cholesterol 43  0 - 99 (mg/dL)   LIPID PANEL     Status: Abnormal   Collection Time   03/26/11  5:00 AM      Component Value Range Comment   Cholesterol 126  0 - 200 (mg/dL)    Triglycerides 161 (*) <150 (mg/dL)    HDL 44  >09 (mg/dL)    Total CHOL/HDL Ratio 2.9      VLDL 36  0 - 40 (mg/dL)    LDL Cholesterol 46  0 - 99 (mg/dL)   CARDIAC PANEL(CRET KIN+CKTOT+MB+TROPI)     Status: Abnormal   Collection Time   03/26/11  5:30 AM      Component Value Range Comment   Total CK 208  7 - 232 (U/L)    CK, MB 5.5 (*) 0.3 - 4.0 (ng/mL)    Troponin I <0.30  <0.30 (ng/mL)    Relative Index 2.6 (*) 0.0 - 2.5      Dg Chest 2 View  03/25/2011  *RADIOLOGY REPORT*  Clinical Data: Syncope an irregular heart beat.  Shortness of breath.  CHEST - 2 VIEW  Comparison: None.  Findings: Slight flattening of the hemidiaphragms.  The lungs are clear.  No airspace disease effusion, or pneumothorax.  The heart, mediastinal, and hilar contours are within normal limits.  IMPRESSION: Mild hyperinflation. This could be related to asthma or COPD. Otherwise, no acute findings.  Original Report Authenticated By: Britta Mccreedy, M.D.   Ct Head Wo Contrast  03/25/2011  *RADIOLOGY REPORT*  Clinical Data:  Near-syncope  CT HEAD WITHOUT CONTRAST  Technique:  Contiguous axial images were obtained from the base of the skull through the vertex without contrast  Comparison:  None.  Findings:  The brain has a normal appearance without evidence for hemorrhage, acute infarction, hydrocephalus, or mass lesion. There is mild age related atrophy.  There is no extra axial fluid collection.  The skull is normal.  There is mild chronic ethmoid paranasal sinus disease.  No mastoid fluid is seen although chronic mastoiditis is suspected on the right.  IMPRESSION:       Unremarkable CT head without contrast.  No acute intracranial findings.  Original Report Authenticated  By: Elsie Stain, M.D.   2-D echocardiogram on 03/27/2011  Study Conclusions - Left ventricle: The cavity size was normal. Wall thickness was increased in a pattern of mild LVH. There was mild concentric hypertrophy. Systolic function was normal. The estimated ejection fraction was in the range of 55% to 60%. Wall motion was normal; there were no regional wall motion abnormalities. Doppler parameters are consistent with abnormal left ventricular relaxation (grade 1 diastolic dysfunction). - Aortic valve: Trivial regurgitation.  Disposition: Home, with the event monitor to be arranged by [facility] cardiology.  Diet: Heart healthy diet  Activity: Resume as tolerated   Follow-up Appts:  Discharge Orders    Future Appointments: Provider: Department: Dept Phone: Center:   03/28/2011 9:15 AM [facility]  None     Future Orders Please Complete By Expires   Diet - low sodium heart healthy      Increase activity slowly      Discharge instructions      Comments:   Followup with Dr. Ladona Ridgel (cardiology) in 2-3 week. Followup with [facility]ology in 1-2 days for an event monitor. Followup with PCP in 1 week.  TESTS THAT NEED FOLLOW-UP None  Time spent on discharge, talking to the patient, and coordinating care: 25 mins.   Signed: Cristal Ford, MD 03/27/2011, 5:11 PM

## 2011-03-27 NOTE — Progress Notes (Signed)
Physical Therapy Evaluation Patient Details Name: Matthew Patrick MRN: [id number] DOB: [birth year] Today's Date: 03/27/2011  Problem List:  Patient Active Problem List  Diagnoses  . Syncope  . Hypertension  . Hypokalemia  . Asthma  . Sciatica  . SOB (shortness of breath)    Past Medical History:  Past Medical History  Diagnosis Date  . Hypertension   . Hyperlipemia   . Anxiety    Past Surgical History:  Past Surgical History  Procedure Date  . Neck skin tag biopsy     PT Assessment/Plan/Recommendation PT Assessment Clinical Impression Statement: Pt is I with mobility and does not have a skilled PT need. Pt is safe to d/c home when medically stable and will not need any follow-up therapy or equipment for d/c home.  PT Recommendation/Assessment: Patent does not need any further PT services No Skilled PT: All education completed;Patient is independent with all acitivity/mobility PT Recommendation Follow Up Recommendations: None Equipment Recommended: None recommended by PT PT Goals     PT Evaluation Precautions/Restrictions    Prior Functioning  Home Living Lives With: Significant other Type of Home: House Home Layout: Two level Alternate Level Stairs-Rails: Right Alternate Level Stairs-Number of Steps: 12 Home Access: Stairs to enter Entrance Stairs-Rails: None Entrance Stairs-Number of Steps: 2 Home Adaptive Equipment: None Prior Function Level of Independence: Independent with homemaking with ambulation Driving: Yes Vocation: Retired Financial risk analyst Arousal/Alertness: Awake/alert Overall Cognitive Status: Appears within functional limits for tasks assessed Orientation Level: Oriented X4 Sensation/Coordination Sensation Light Touch: Appears Intact Coordination Gross Motor Movements are Fluid and Coordinated: Yes Extremity Assessment RLE Assessment RLE Assessment: Within Functional Limits LLE Assessment LLE Assessment: Within Functional  Limits Mobility (including Balance) Bed Mobility Bed Mobility: Yes Supine to Sit: 7: Independent Transfers Transfers: Yes Sit to Stand: 7: Independent Stand to Sit: 7: Independent Ambulation/Gait Ambulation/Gait: Yes Ambulation/Gait Assistance: 7: Independent Ambulation Distance (Feet): 300 Feet Assistive device: None Gait Pattern: Within Functional Limits Gait velocity: normal Stairs: No  Posture/Postural Control Posture/Postural Control: No significant limitations Balance Balance Assessed: Yes Dynamic Standing Balance Dynamic Standing - Balance Support: No upper extremity supported Dynamic Standing - Level of Assistance: 7: Independent Dynamic Standing - Balance Activities: Forward lean/weight shifting;Reaching for objects;Lateral lean/weight shifting;Reaching across midline;Other (comment) (picking up object off floor) Exercise    End of Session PT - End of Session Activity Tolerance: Patient tolerated treatment well Patient left: in bed;with call bell in reach Nurse Communication: Mobility status for ambulation;Other (comment) (pt is I with mobility, safe to walk alone on unit) General Behavior During Session: [facility] for tasks performed Cognition: [facility] for tasks performed  Greggory Stallion 03/27/2011, 8:23 AM

## 2011-03-27 NOTE — Progress Notes (Signed)
*  PRELIMINARY RESULTS* Echocardiogram 2D Echocardiogram has been performed.  Sherren Kerns Renee 03/27/2011, 11:54 AM

## 2011-03-27 NOTE — Progress Notes (Signed)
Subjective: Denies any pain.  No specific complaints.  Wondering when he can be discharged.  Objective: Vital signs in last 24 hours: Filed Vitals:   03/26/11 2100 03/27/11 0500 03/27/11 1400 03/27/11 1520  BP: 123/68 128/74 99/60 131/99  Pulse: 60 61 77 108  Temp: 98 F (36.7 C) 97.4 F (36.3 C) 97.9 F (36.6 C) 97.6 F (36.4 C)  TempSrc: Oral Oral    Resp: 18 18 16 16   Height:      Weight:      SpO2: 96% 96% 94% 99%   Weight change:   Intake/Output Summary (Last 24 hours) at 03/27/11 1711 Last data filed at 03/27/11 1300  Gross per 24 hour  Intake    720 ml  Output      0 ml  Net    720 ml   Physical Exam: General: Awake, Oriented, No acute distress. HEENT: EOMI. Neck: Supple CV: S1 and S2 Lungs: Clear to ascultation bilaterally Abdomen: Soft, Nontender, Nondistended, +bowel sounds. Ext: Good pulses. Trace edema.  Lab Results:  [facility] 03/26/11 0500 03/25/11 2104 03/25/11 1830  NA 135 -- 137  K 3.7 -- 3.4*  CL 104 -- 102  CO2 25 -- 28  GLUCOSE 83 -- 100*  BUN 13 -- 15  CREATININE 0.88 -- 0.84  CALCIUM 8.7 9.1 --  MG -- 2.1 --  PHOS -- -- --    Basename 03/25/11 1830  AST 19  ALT 20  ALKPHOS 62  BILITOT 0.2*  PROT 6.2  ALBUMIN 3.7   No results found for this basename: LIPASE:2,AMYLASE:2 in the last 72 hours  Basename 03/25/11 1830  WBC 6.2  NEUTROABS 4.0  HGB 13.0  HCT 36.9*  MCV 91.3  PLT 206    Basename 03/26/11 0530 03/25/11 2328 03/25/11 2057  CKTOTAL 208 226 234*  CKMB 5.5* 5.6* 5.9*  CKMBINDEX -- -- --  TROPONINI <0.30 <0.30 <0.30   No results found for this basename: POCBNP:3 in the last 72 hours  Basename 03/25/11 2058 03/25/11 1830  DDIMER 0.26 0.34    Basename 03/26/11 0500  HGBA1C 5.7*    Basename 03/26/11 0500  CHOL 124126  HDL 4544  LDLCALC 4346  TRIG 181*178*  CHOLHDL 2.82.9  LDLDIRECT --    Basename 03/25/11 2104  TSH 1.071  T4TOTAL --  T3FREE --  THYROIDAB --   No results found for this  basename: VITAMINB12:2,FOLATE:2,FERRITIN:2,TIBC:2,IRON:2,RETICCTPCT:2 in the last 72 hours  Micro Results: No results found for this or any previous visit (from the past 240 hour(s)).  Studies/Results: Dg Chest 2 View  03/25/2011  *RADIOLOGY REPORT*  Clinical Data: Syncope an irregular heart beat.  Shortness of breath.  CHEST - 2 VIEW  Comparison: None.  Findings: Slight flattening of the hemidiaphragms.  The lungs are clear.  No airspace disease effusion, or pneumothorax.  The heart, mediastinal, and hilar contours are within normal limits.  IMPRESSION: Mild hyperinflation. This could be related to asthma or COPD. Otherwise, no acute findings.  Original Report Authenticated By: Britta Mccreedy, M.D.   Ct Head Wo Contrast  03/25/2011  *RADIOLOGY REPORT*  Clinical Data:  Near-syncope  CT HEAD WITHOUT CONTRAST  Technique:  Contiguous axial images were obtained from the base of the skull through the vertex without contrast  Comparison:  None.  Findings:  The brain has a normal appearance without evidence for hemorrhage, acute infarction, hydrocephalus, or mass lesion. There is mild age related atrophy.  There is no extra axial fluid collection.  The skull  is normal.  There is mild chronic ethmoid paranasal sinus disease.  No mastoid fluid is seen although chronic mastoiditis is suspected on the right.  IMPRESSION:       Unremarkable CT head without contrast.  No acute intracranial findings.  Original Report Authenticated By: Elsie Stain, M.D.   2-D echocardiogram on 03/27/2011 Study Conclusions - Left ventricle: The cavity size was normal. Wall thickness was increased in a pattern of mild LVH. There was mild concentric hypertrophy. Systolic function was normal. The estimated ejection fraction was in the range of 55% to 60%. Wall motion was normal; there were no regional wall motion abnormalities. Doppler parameters are consistent with abnormal left ventricular relaxation (grade 1 diastolic  dysfunction). - Aortic valve: Trivial regurgitation.  Medications: I have reviewed the patient's current medications. Scheduled Meds:   . aspirin  81 mg Oral Daily  . enoxaparin  40 mg Subcutaneous Q24H  . hydrochlorothiazide  25 mg Oral Daily  . lisinopril  10 mg Oral Daily  . loratadine  10 mg Oral Daily  . LORazepam  0.5 mg Oral Q8H  . mirtazapine  7.5 mg Oral QHS  . rosuvastatin  5 mg Oral Daily  . sertraline  25 mg Oral Daily   Continuous Infusions:  PRN Meds:.acetaminophen, acetaminophen, albuterol, ondansetron (ZOFRAN) IV, senna-docusate  Assessment/Plan: 1. Syncope. Etiology unclear. Patient had a head CT with results as indicated above. D-dimer normal, indicating low likely hood for thromboembolic event. 2-D echocardiogram as indicated. Bilateral carotid Dopplers showed no significant ICA stenosis. Cardiology recommended event monitor as outpatient. EEG canceled. Patient had orthostatics checked and was not orthostatic, patient may have received fluids in the ER.   2. Shortness of breath. Resolved. D-dimer normal.   3. Hypertension. Stable continue hydrochlorothiazide and lisinopril.   4. Hypokalemia. Resolved with replacement.   5. History of asthma. Stable.   6. History of sciatica. Stable.   7. Headache. Resolved. ESR normal. Head CT negative.   8. Prophylaxis. Lovenox for DVT prophylaxis.   9. Disposition.  Discharge patient home today.   LOS: 2 days  Tylik Treese A, MD 03/27/2011, 5:11 PM

## 2011-03-28 ENCOUNTER — Ambulatory Visit (HOSPITAL_COMMUNITY): Payer: Medicare Other

## 2011-03-28 ENCOUNTER — Telehealth: Payer: Self-pay

## 2011-03-28 ENCOUNTER — Telehealth: Payer: Self-pay | Admitting: Internal Medicine

## 2011-03-28 ENCOUNTER — Encounter: Payer: Self-pay | Admitting: *Deleted

## 2011-03-28 DIAGNOSIS — R55 Syncope and collapse: Secondary | ICD-10-CM

## 2011-03-28 NOTE — Progress Notes (Signed)
Retro ur ins review. 

## 2011-03-28 NOTE — Telephone Encounter (Signed)
Will schedule patient for an event monitor and follow up in 3 weeks  Melissa to call patient and schedule

## 2011-03-28 NOTE — Telephone Encounter (Signed)
New Msg: Pt recently d/c from hospital 12/3 and pt was told last night by Dr. Ladona Ridgel to call and come over today and get a heart monitor to wear for one month to recorded when heart stops. Pt also was told in d/c instructions that he needed to see Dr. Ladona Ridgel in 2-3 weeks however Dr. Ladona Ridgel doesn't have any available appts in 2-3 weeks. Please return pt call to discuss further.

## 2011-03-29 ENCOUNTER — Ambulatory Visit (HOSPITAL_COMMUNITY): Payer: Medicare Other

## 2011-03-30 ENCOUNTER — Encounter (INDEPENDENT_AMBULATORY_CARE_PROVIDER_SITE_OTHER): Payer: Medicare Other

## 2011-03-30 DIAGNOSIS — R55 Syncope and collapse: Secondary | ICD-10-CM

## 2011-03-30 NOTE — Telephone Encounter (Signed)
Patient has appt for 03/30/2011

## 2011-04-06 ENCOUNTER — Encounter: Payer: Self-pay | Admitting: Internal Medicine

## 2011-04-06 ENCOUNTER — Ambulatory Visit (INDEPENDENT_AMBULATORY_CARE_PROVIDER_SITE_OTHER): Payer: Medicare Other | Admitting: Internal Medicine

## 2011-04-06 DIAGNOSIS — R55 Syncope and collapse: Secondary | ICD-10-CM

## 2011-04-06 DIAGNOSIS — I10 Essential (primary) hypertension: Secondary | ICD-10-CM

## 2011-04-06 NOTE — Assessment & Plan Note (Signed)
The etiology is unclear. He will continue his cardiac monitor. If no arrhythmia is demonstrated, a period of watchful waiting will ensue.

## 2011-04-06 NOTE — Patient Instructions (Signed)
Your physician recommends that you schedule a follow-up appointment as needed  

## 2011-04-06 NOTE — Progress Notes (Signed)
HPI Matthew Patrick returns today for followup. He is a 65 year old man who I initially saw several weeks ago in consultation for syncope. The patient has a history of hypertension and dyslipidemia. He notes that he has been under increasing stress going through divorce proceedings. The patient was admitted to the hospital after a syncopal episode. There is no obvious etiology. He is currently wearing a cardiac monitor. He has had no recurrent symptoms. He denies chest pain, shortness of breath, or peripheral edema. No Known Allergies   Current Outpatient Prescriptions  Medication Sig Dispense Refill  . aspirin 81 MG chewable tablet Chew 81 mg by mouth daily.        . cetirizine (ZYRTEC) 10 MG tablet Take 10 mg by mouth daily.        . hydrochlorothiazide (HYDRODIURIL) 12.5 MG tablet Take 12.5 mg by mouth daily.        Marland Kitchen ibuprofen (ADVIL,MOTRIN) 600 MG tablet Take 600 mg by mouth every 6 (six) hours as needed. For headache       . lisinopril-hydrochlorothiazide (PRINZIDE,ZESTORETIC) 10-12.5 MG per tablet Take 1 tablet by mouth daily.        Marland Kitchen LORazepam (ATIVAN) 0.5 MG tablet Take 0.5 mg by mouth every 8 (eight) hours. For anxiety       . mirtazapine (REMERON) 7.5 MG tablet Take 7.5 mg by mouth at bedtime. Unknown dose per pt      . rosuvastatin (CRESTOR) 5 MG tablet Take 5 mg by mouth daily. Unknown dose per pt       . sertraline (ZOLOFT) 25 MG tablet Take 25 mg by mouth daily. Unknown dose per pt          Past Medical History  Diagnosis Date  . Hypertension   . Hyperlipemia   . Anxiety   . Asthma   . History of TIAs     ROS:   All systems reviewed and negative except as noted in the HPI.   Past Surgical History  Procedure Date  . Neck skin tag biopsy   . 2d echocardiogram 03/27/11     History reviewed. No pertinent family history.   History   Social History  . Marital Status: Significant Other    Spouse Name: N/A    Number of Children: N/A  . Years of Education: N/A    Occupational History  . Not on file.   Social History Main Topics  . Smoking status: Former Games developer  . Smokeless tobacco: Not on file  . Alcohol Use: Not on file     2 glasses of wine per night  . Drug Use: Not on file  . Sexually Active: Not on file   Other Topics Concern  . Not on file   Social History Narrative  . No narrative on file     BP 110/62  Pulse 85  Ht 5\' 10"  (1.778 m)  Wt 72.938 kg (160 lb 12.8 oz)  BMI 23.07 kg/m2  SpO2 98%  Physical Exam:  Well appearing middle-aged man, NAD HEENT: Unremarkable Neck:  No JVD, no thyromegally Lymphatics:  No adenopathy Back:  No CVA tenderness Lungs:  Clear with no wheezes, rales, or rhonchi. HEART:  Regular rate rhythm, no murmurs, no rubs, no clicks Abd:  soft, positive bowel sounds, no organomegally, no rebound, no guarding Ext:  2 plus pulses, no edema, no cyanosis, no clubbing Skin:  No rashes no nodules Neuro:  CN II through XII intact, motor grossly intact  DEVICE  Normal device  function.  See PaceArt for details.   Assess/Plan:

## 2011-04-06 NOTE — Assessment & Plan Note (Signed)
His blood pressure is very well controlled. If he has any orthostatic symptoms, and we would plan to discontinue his blood pressure medications.

## 2013-04-14 ENCOUNTER — Encounter (INDEPENDENT_AMBULATORY_CARE_PROVIDER_SITE_OTHER): Payer: Self-pay

## 2013-04-14 ENCOUNTER — Ambulatory Visit (INDEPENDENT_AMBULATORY_CARE_PROVIDER_SITE_OTHER): Payer: Medicare Other | Admitting: Internal Medicine

## 2013-04-14 ENCOUNTER — Encounter: Payer: Self-pay | Admitting: Internal Medicine

## 2013-04-14 ENCOUNTER — Other Ambulatory Visit: Payer: Medicare Other

## 2013-04-14 VITALS — BP 120/78 | HR 81 | Ht 70.0 in | Wt 165.2 lb

## 2013-04-14 DIAGNOSIS — J449 Chronic obstructive pulmonary disease, unspecified: Secondary | ICD-10-CM

## 2013-04-14 DIAGNOSIS — R059 Cough, unspecified: Secondary | ICD-10-CM

## 2013-04-14 DIAGNOSIS — R0602 Shortness of breath: Secondary | ICD-10-CM

## 2013-04-14 DIAGNOSIS — R55 Syncope and collapse: Secondary | ICD-10-CM

## 2013-04-14 DIAGNOSIS — R05 Cough: Secondary | ICD-10-CM

## 2013-04-14 DIAGNOSIS — J4489 Other specified chronic obstructive pulmonary disease: Secondary | ICD-10-CM

## 2013-04-14 NOTE — Progress Notes (Signed)
Subjective:    Patient ID: Matthew Patrick, male    DOB: July 05, 1945, 67 y.o.   MRN: [id number] PCP Hoyle Sauer, MD   HPI  Chief Complaint  Patient presents with  . Pulmonary Consult    Pt c/o having a dry cough and SOB since 02-24-13. Pt states he has been on 3 rounds of abx and prednisone since 02-24-13.      REferred for dyspnea and cough  Dyspnea: Insidious onset x 6 to 8 years. Worse past few months. Moderate in severity. Mostly exertional. Relieved by rest. Activitis likes flight or two of stairs or bending bring it on. A year ago same activities noticed it only to a mild extent. No associated chest pain or pnd or edema or weight loss. Walking desaturatin test: 185 feet x 3 laps:PEak HR 98% and lowest pulse ox 92%  Cough: there is associatd cough. STarted few months ago when dyspnea got worse.  Mostly dry but can cough out some mucus. Cough worse when he lies down. Associated clearing of throat +.    Above has resulted in several rounds of zpak and even prednisone but no respnse. Uses asmamnex, nasonex but feels this makes it worse. Also tried spiriva but made things worse. He is puzzled tjhat all Rx make symptoms worse  Dyspnea and Cough releavant hx  BP  - was on ace inhibitors but quit it long time ago (?Years ago)  Sinus  - does have sinus drainage. On nasal steroids; not helping  GI  - has occ GERD. On PPI but not helping  Lungs  -  reports that he quit smoking about 46 years ago. His smoking use included Cigarettes. He has a 5 pack-year smoking history. He does not have any smokeless tobacco history on file.   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 Official report 03/18/13 - LL atlectasis. CXR in our ssystem 03/25/11 - HYPERINFLATED   - spiroemtry today shows obstructive lung disease - fev1 1.8L/51%, RAtio 58      Review of Systems  Constitutional:  Negative for fever and unexpected weight change.  HENT: Positive for congestion, sore throat and trouble swallowing. Negative for dental problem, ear pain, nosebleeds, postnasal drip, rhinorrhea, sinus pressure and sneezing.   Eyes: Negative for redness and itching.  Respiratory: Positive for cough and shortness of breath. Negative for chest tightness and wheezing.   Cardiovascular: Negative for palpitations and leg swelling.  Gastrointestinal: Negative for nausea and vomiting.  Genitourinary: Negative for dysuria.  Musculoskeletal: Negative for joint swelling.  Skin: Negative for rash.  Neurological: Negative for headaches.  Hematological: Does not bruise/bleed easily.  Psychiatric/Behavioral: Negative for dysphoric mood. The patient is nervous/anxious.        Objective:   Physical Exam  Nursing note and vitals reviewed. Constitutional: He is oriented to person, place, and time. He appears well-developed and well-nourished. No distress.  Clears throat  HENT:  Head: Normocephalic and atraumatic.  Right Ear: External ear normal.  Left Ear: External ear normal.  Mouth/Throat: Oropharynx is clear and moist. No oropharyngeal exudate.  Eyes: Conjunctivae and EOM are normal. Pupils are equal, round, and reactive to light. Right eye exhibits no discharge. Left eye exhibits no discharge. No scleral icterus.  Neck: Normal range of motion. Neck supple. No JVD present. No tracheal deviation present. No thyromegaly present.  Cardiovascular: Normal rate, regular rhythm and  intact distal pulses.  Exam reveals no gallop and no friction rub.   No murmur heard. Pulmonary/Chest: Effort normal and breath sounds normal. No respiratory distress. He has no wheezes. He has no rales. He exhibits no tenderness.  Quiet air entry +  Abdominal: Soft. Bowel sounds are normal. He exhibits no distension and no mass. There is no tenderness. There is no rebound and no guarding.  Musculoskeletal: Normal range of  motion. He exhibits no edema and no tenderness.  Lymphadenopathy:    He has no cervical adenopathy.  Neurological: He is alert and oriented to person, place, and time. He has normal reflexes. No cranial nerve deficit. Coordination normal.  Skin: Skin is warm and dry. No rash noted. He is not diaphoretic. No erythema. No pallor.  Psychiatric: He has a normal mood and affect. His behavior is normal. Judgment and thought content normal.          Assessment & Plan:

## 2013-04-14 NOTE — Patient Instructions (Signed)
Unclear cause of obstructive lung disease Do full PFT test at any location - today or tomorrow Do alpha 1 blood test [facility] us as soon as test is complete so I can review test result with you My CMA will get records from Hoyle Sauer, MD office

## 2013-04-15 ENCOUNTER — Ambulatory Visit (HOSPITAL_COMMUNITY)
Admission: RE | Admit: 2013-04-15 | Discharge: 2013-04-15 | Disposition: A | Discharge status: Home / Self Care | Payer: Medicare Other | Source: Ambulatory Visit | Attending: Internal Medicine | Admitting: Internal Medicine

## 2013-04-15 ENCOUNTER — Telehealth: Payer: Self-pay | Admitting: Internal Medicine

## 2013-04-15 DIAGNOSIS — J449 Chronic obstructive pulmonary disease, unspecified: Secondary | ICD-10-CM | POA: Insufficient documentation

## 2013-04-15 DIAGNOSIS — J4489 Other specified chronic obstructive pulmonary disease: Secondary | ICD-10-CM | POA: Insufficient documentation

## 2013-04-15 DIAGNOSIS — R0600 Dyspnea, unspecified: Secondary | ICD-10-CM

## 2013-04-15 DIAGNOSIS — Z87891 Personal history of nicotine dependence: Secondary | ICD-10-CM | POA: Insufficient documentation

## 2013-04-15 LAB — PULMONARY FUNCTION TEST
DLCO unc: 19.59 ml/min/mmHg
FEF2575-%Change-Post: 65 %
FEV1-%Change-Post: 17 %
FEV1-%Pred-Post: 75 %
FEV1-%Pred-Pre: 64 %
FEV1-Post: 2.52 L
FEV1FVC-%Change-Post: 2 %
FEV6-%Change-Post: 15 %
FEV6-%Pred-Pre: 81 %
FEV6-Pre: 3.46 L
FEV6FVC-%Pred-Post: 104 %
FEV6FVC-%Pred-Pre: 103 %
FVC-%Pred-Post: 90 %
FVC-Pre: 3.56 L
Post FEV1/FVC ratio: 62 %
Post FEV6/FVC ratio: 98 %
Pre FEV6/FVC Ratio: 97 %
RV % pred: 130 %
RV: 3.14 L
TLC % pred: 97 %
TLC: 6.84 L

## 2013-04-15 MED ORDER — ALBUTEROL SULFATE (5 MG/ML) 0.5% IN NEBU
2.5000 mg | INHALATION_SOLUTION | Freq: Once | RESPIRATORY_TRACT | Status: AC
  Administered 2013-04-15: 2.5 mg via RESPIRATORY_TRACT

## 2013-04-15 NOTE — Telephone Encounter (Signed)
  PFT shows obstructive lung disesase iwh bronchdolilator response - as in asthma or emphysema just like spiro yesterday but not as bad. Not sure why he is not responding to steroids and inhalers that DR Avva tried.   Please have him do CT scan chest wo contrast as follows. YOu have to order as  [facility] CT chest and in instuctions write ILD protocol without contrast on ILD protocol. Only  Dr Leanna Battles or Dr. Trudie Reed to read. Done only at [facility]. Indication: is dyspnea, rule out ILD   Also set up ONO   REturn to see me early Jan 2015 after above  Dr. Kalman Shan, M.D., [facility].C.P Pulmonary and Critical Care Medicine Staff Physician [facility] [place] Pager: [phone], If no answer or between  15:00h - 7:00h: call [phone]  04/15/2013 5:11 PM

## 2013-04-15 NOTE — Telephone Encounter (Signed)
MR, please advise on PFT done today at [facility]  Thanks!

## 2013-04-15 NOTE — Telephone Encounter (Signed)
Spoke with pt and notified of results per Dr. Marchelle Gearing. Pt verbalized understanding and denied any questions. Orders were sent to [facility] for CT Chest and for ONO

## 2013-04-16 LAB — ALPHA-1-ANTITRYPSIN: A-1 Antitrypsin, Ser: 142 mg/dL (ref 90–200)

## 2013-04-17 DIAGNOSIS — R05 Cough: Secondary | ICD-10-CM | POA: Insufficient documentation

## 2013-04-17 DIAGNOSIS — J449 Chronic obstructive pulmonary disease, unspecified: Secondary | ICD-10-CM | POA: Insufficient documentation

## 2013-04-17 NOTE — Assessment & Plan Note (Signed)
He seems to clear his throat a lot and this suggests he might have multifactorial cough. Wil evluate this after we sort out his obstructive lung diesase

## 2013-04-17 NOTE — Assessment & Plan Note (Signed)
Unclear cause of obstructive lung disease Unvclear why conventional MDI and prednisone not helping him  PLAN Do full PFT test at any location - today or tomorrow Do alpha 1 blood test [facility] us as soon as test is complete so I can review test result with you My CMA will get records from Hoyle Sauer, MD office

## 2013-04-22 ENCOUNTER — Ambulatory Visit (INDEPENDENT_AMBULATORY_CARE_PROVIDER_SITE_OTHER)
Admission: RE | Admit: 2013-04-22 | Discharge: 2013-04-22 | Disposition: A | Discharge status: Home / Self Care | Payer: Medicare Other | Source: Ambulatory Visit | Attending: Internal Medicine | Admitting: Internal Medicine

## 2013-04-22 DIAGNOSIS — R0609 Other forms of dyspnea: Secondary | ICD-10-CM

## 2013-04-22 DIAGNOSIS — R0600 Dyspnea, unspecified: Secondary | ICD-10-CM

## 2013-04-22 DIAGNOSIS — R0989 Other specified symptoms and signs involving the circulatory and respiratory systems: Secondary | ICD-10-CM

## 2013-04-25 ENCOUNTER — Telehealth: Payer: Self-pay | Admitting: Internal Medicine

## 2013-04-25 DIAGNOSIS — G4734 Idiopathic sleep related nonobstructive alveolar hypoventilation: Secondary | ICD-10-CM

## 2013-04-25 NOTE — Telephone Encounter (Signed)
Spoke with pt  He is calling for ct chest results  Please advise thanks

## 2013-04-28 ENCOUNTER — Telehealth: Payer: Self-pay | Admitting: Internal Medicine

## 2013-04-28 NOTE — Telephone Encounter (Signed)
Spoke with pt. He has a new insurance carrier now. Now with [employer]. He will bring updated insurance card information at that time. Nothing further needed

## 2013-04-28 NOTE — Telephone Encounter (Signed)
CT 04/22/13  does not show any lung cancer or ILD. He does have some scarring lung base proabably related to pripor infection perhaps and can explain loss of lung function. Righter upper lobe nodule 78mm . Otherwise clear lung fields. BEst I explain all this in context when I see him 05/08/13  ONO shows 3.5h of desat at noight < 88%. Will benefit from 2L O2 at night   Dr. Brand Males, M.D., [facility].C.P Pulmonary and Critical Care Medicine Staff Physician [place] [facility] Pager: [phone], If no answer or between  15:00h - 7:00h: call [phone]  04/28/2013 9:12 AM

## 2013-04-28 NOTE — Telephone Encounter (Signed)
Pt is aware of ONO results. Advised MR will go over CT at [place]. Nothing further needed

## 2013-05-08 ENCOUNTER — Encounter: Payer: Self-pay | Admitting: Internal Medicine

## 2013-05-08 ENCOUNTER — Ambulatory Visit (INDEPENDENT_AMBULATORY_CARE_PROVIDER_SITE_OTHER): Payer: Medicare Other | Admitting: Internal Medicine

## 2013-05-08 VITALS — BP 136/84 | HR 72 | Ht 70.0 in | Wt 167.6 lb

## 2013-05-08 DIAGNOSIS — R053 Chronic cough: Secondary | ICD-10-CM

## 2013-05-08 DIAGNOSIS — G4734 Idiopathic sleep related nonobstructive alveolar hypoventilation: Secondary | ICD-10-CM

## 2013-05-08 DIAGNOSIS — R911 Solitary pulmonary nodule: Secondary | ICD-10-CM

## 2013-05-08 DIAGNOSIS — J449 Chronic obstructive pulmonary disease, unspecified: Secondary | ICD-10-CM

## 2013-05-08 DIAGNOSIS — R059 Cough, unspecified: Secondary | ICD-10-CM

## 2013-05-08 DIAGNOSIS — R0902 Hypoxemia: Secondary | ICD-10-CM

## 2013-05-08 DIAGNOSIS — R05 Cough: Secondary | ICD-10-CM

## 2013-05-08 MED ORDER — GABAPENTIN 300 MG PO CAPS: ORAL_CAPSULE | ORAL | Status: DC

## 2013-05-08 NOTE — Patient Instructions (Addendum)
#  obstructive lung disease and dyspnea - try ANORO and Pulmicort - refer pulmonary rehab - if unimproved cconsider cardiac eval   #Overnight hypoxemia  - continue o2 at night with hypertonic saline spray   - at some point we can look at repeat sleep eval in our office #Chronic cough  - Take gabapentin 300mg  once daily x 3 days, then 300mg  twice daily x 3 days, then 300mg  three times daily to continue. If this makes you too sleepy or drowsy call us and we will cut your medication dosing down - refer speech therapy with Mr Garald Balding  -  take generic fluticasone inhaler 2 squirts each nostril daily - hypertonic saline nasal spray s squirts each nostril at night - continue prilosec (Dr Collene Mares will consider endoscopy later on depending on response to above)  #76mmRUL Llung nodule Dec 2014  - repeat CT chest wo contrast Jan 2016  #FOllowup  6 weeks  cough score and copd cat score at followup Spirometry at followup

## 2013-05-08 NOTE — Progress Notes (Signed)
Subjective:    Patient ID: Matthew Patrick, male    DOB: 30-Dec-1945, 68 y.o.   MRN: [id number]  HPI  PCP Matthew Ringer, MD   IOV 05/08/2013   Chief Complaint  Patient presents with  . Pulmonary Consult    Pt c/o having a dry cough and SOB since 02-24-13. Pt states he has been on 3 rounds of abx and prednisone since 02-24-13.      REferred for dyspnea and cough  Dyspnea: Insidious onset x 6 to 8 years. Worse past few months. Moderate in severity. Mostly exertional. Relieved by rest. Activitis likes flight or two of stairs or bending bring it on. A year ago same activities noticed it only to a mild extent. No associated chest pain or pnd or edema or weight loss. Walking desaturatin test: 185 feet x 3 laps:PEak HR 98% and lowest pulse ox 92%  Cough: there is associatd cough. STarted few months ago when dyspnea got worse.  Mostly dry but can cough out some mucus. Cough worse when he lies down. Associated clearing of throat +.    Above has resulted in several rounds of zpak and even prednisone but no respnse. Uses asmamnex, nasonex but feels this makes it worse. Also tried spiriva but made things worse. He is puzzled tjhat all Rx make symptoms worse  Dyspnea and Cough releavant hx  BP  - was on ace inhibitors but quit it long time ago (?Years ago)  Sinus  - does have sinus drainage. On nasal steroids; not helping  GI  - has occ GERD. On PPI but not helping  Lungs  -  reports that he quit smoking about 46 years ago. His smoking use included Cigarettes. He has a 5 pack-year smoking history. He does not have any smokeless tobacco history on file.   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 Official report 03/18/13 - LL atlectasis. CXR in our ssystem 03/25/11 - HYPERINFLATED   - spiroemtry today shows obstructive lung disease - fev1 1.8L/51%, RAtio 58  Cards  - normal echo 2012  and doppler carotid at time of syncope  OV 05/08/2013  Here for result review  PFT 04/15/14: PFT shows obstructive lung disesase iwh bronchdolilator response - as in asthma or emphysema just like spiro yesterday but not as bad. Not sure why he is not responding to steroids and inhalers that DR Avva tried.   ONO s12/29/14:  hows 3.5h of desat at noight < 88%. Started on 2L o2 which he says helps with quality of life   CT chest 04/22/13  -  IMPRESSION:  1. The appearance of the lung parenchyma is not suggestive of an  underlying interstitial lung disease at this time.  2. However, there is extensive volume loss throughout the lower  aspects of the lungs bilaterally with what appears to be at  multifocal areas of mild scarring, presumably related to prior  episodes of infection/inflammation.  3. 4 mm right upper lobe nodule is highly nonspecific. If the  patient is at high risk for bronchogenic carcinoma, follow-up chest  CT at 1year is recommended. If the patient is at low risk, no  follow-up is needed. This recommendation follows the consensus  statement: Guidelines for Management of Small Pulmonary Nodules  Detected on CT Scans: A Statement from the [place] as  published in Radiology 2005; [id number]:395-400.  Electronically Signed  By: Vinnie Langton M.D.  On: 04/22/2013 16:57   sAw GI Dr Collene Mares 05/11/13 - strted on PPI. She wants to hold off on scope till resp issues are optimized  Since starting noctureal o2 and ppi:  he explaiend that nocturana gasps are imprved =with nocurnal o2 and quality of sleep better. He did have bloating. He wondered about UGI scope.  Patient continues to clear his throat a lot. HE feels he might have OSA but apparently tests few years ago ruled this out; wife does not believe test to be accurate.   HE has sinus drainge and not on regular Rx.   Review of Systems  Constitutional: Negative for fever and unexpected weight change.  HENT: Negative for  congestion, dental problem, ear pain, nosebleeds, postnasal drip, rhinorrhea, sinus pressure, sneezing, sore throat and trouble swallowing.   Eyes: Negative for redness and itching.  Respiratory: Positive for cough and shortness of breath. Negative for chest tightness and wheezing.   Cardiovascular: Negative for palpitations and leg swelling.  Gastrointestinal: Negative for nausea and vomiting.  Genitourinary: Negative for dysuria.  Musculoskeletal: Negative for joint swelling.  Skin: Negative for rash.  Neurological: Negative for headaches.  Hematological: Does not bruise/bleed easily.  Psychiatric/Behavioral: Negative for dysphoric mood. The patient is not nervous/anxious.        Objective:   Physical Exam    Filed Vitals:   05/08/13 1622  BP: 136/84  Pulse: 72  Height: 5\' 10"  (5.701 m)  Weight: 167 lb 9.6 oz (76.023 kg)  SpO2: 96%   Discussion onlyh visit    Assessment & Plan:  #obstructive lung disease and dyspnea - try ANORO and Pulmicort - refer pulmonary rehab - if unimproved cconsider cardiac eval   #Overnight hypoxemia  - continue o2 at night with hypertonic saline spray   - at some point we can look at repeat sleep eval in our office #Chronic cough  - Take gabapentin 300mg  once daily x 3 days, then 300mg  twice daily x 3 days, then 300mg  three times daily to continue. If this makes you too sleepy or drowsy call us and we will cut your medication dosing down - refer speech therapy with Mr Garald Balding  -  take generic fluticasone inhaler 2 squirts each nostril daily - hypertonic saline nasal spray s squirts each nostril at night - continue prilosec (Dr Collene Mares will consider endoscopy later on depending on response to above)  #80mmRUL Llung nodule Dec 2014  - repeat CT chest wo contrast Jan 2016  #FOllowup  6 weeks  cough score and copd cat score at followup Spirometry at followup   > 50% of this > 25 min visit spent in face to face counseling (15 min visit  converted to 25 min)

## 2013-05-11 DIAGNOSIS — R911 Solitary pulmonary nodule: Secondary | ICD-10-CM | POA: Insufficient documentation

## 2013-05-11 DIAGNOSIS — G4734 Idiopathic sleep related nonobstructive alveolar hypoventilation: Secondary | ICD-10-CM | POA: Insufficient documentation

## 2013-05-11 NOTE — Assessment & Plan Note (Signed)
#  Overnight hypoxemia  - continue o2 at night with hypertonic saline spray due to mild epistaxis   - at some point we can look at repeat sleep eval in our office

## 2013-05-11 NOTE — Assessment & Plan Note (Signed)
#  obstructive lung disease and dyspnea - try ANORO and Pulmicort - refer pulmonary rehab - if unimproved cconsider cardiac eval #FOllowup  6 weeks  cough score and copd cat score at followup Spirometry at followup

## 2013-05-11 NOTE — Assessment & Plan Note (Signed)
#  65mmRUL Llung nodule Dec 2014  - repeat CT chest wo contrast Jan 2016

## 2013-05-11 NOTE — Assessment & Plan Note (Signed)
#  Chronic cough  - Take gabapentin 300mg  once daily x 3 days, then 300mg  twice daily x 3 days, then 300mg  three times daily to continue. If this makes you too sleepy or drowsy call us and we will cut your medication dosing down - refer speech therapy with Mr Garald Balding  -  take generic fluticasone inhaler 2 squirts each nostril daily - hypertonic saline nasal spray s squirts each nostril at night - continue prilosec (Dr Collene Mares will consider endoscopy later on depending on response to above)    #FOllowup  6 weeks  cough score and copd cat score at followup Spirometry at followup

## 2013-05-13 ENCOUNTER — Encounter: Payer: Self-pay | Admitting: Internal Medicine

## 2013-05-14 ENCOUNTER — Ambulatory Visit: Payer: Medicare Other

## 2013-05-19 ENCOUNTER — Encounter: Payer: Self-pay | Admitting: Internal Medicine

## 2013-05-19 DIAGNOSIS — J449 Chronic obstructive pulmonary disease, unspecified: Secondary | ICD-10-CM

## 2013-05-19 NOTE — Telephone Encounter (Signed)
Pt is using oxygen at night and is c/o having nasal congestion and bloody nose. He is using vaseline and saline spray. He is asking for an order be placed to add humidity to oxygen. Order placed. [place], CMA

## 2013-05-21 ENCOUNTER — Ambulatory Visit: Payer: Medicare Other

## 2013-05-23 ENCOUNTER — Ambulatory Visit: Payer: Medicare Other | Attending: Internal Medicine

## 2013-05-23 DIAGNOSIS — IMO0001 Reserved for inherently not codable concepts without codable children: Secondary | ICD-10-CM | POA: Insufficient documentation

## 2013-05-23 DIAGNOSIS — R498 Other voice and resonance disorders: Secondary | ICD-10-CM | POA: Insufficient documentation

## 2013-06-03 ENCOUNTER — Ambulatory Visit: Payer: Medicare Other | Admitting: Speech Pathology

## 2013-06-05 ENCOUNTER — Ambulatory Visit: Payer: Medicare Other | Admitting: Speech Pathology

## 2013-06-10 ENCOUNTER — Encounter: Payer: Medicare Other | Admitting: Speech Pathology

## 2013-06-12 ENCOUNTER — Encounter: Payer: Medicare Other | Admitting: Speech Pathology

## 2013-06-17 ENCOUNTER — Encounter: Payer: Medicare Other | Admitting: Speech Pathology

## 2013-06-19 ENCOUNTER — Ambulatory Visit: Payer: Medicare Other | Admitting: Internal Medicine

## 2013-07-09 ENCOUNTER — Encounter: Payer: Self-pay | Admitting: Internal Medicine

## 2013-07-09 ENCOUNTER — Ambulatory Visit (INDEPENDENT_AMBULATORY_CARE_PROVIDER_SITE_OTHER): Payer: Medicare Other | Admitting: Internal Medicine

## 2013-07-09 VITALS — BP 132/82 | HR 75 | Ht 70.0 in | Wt 165.6 lb

## 2013-07-09 DIAGNOSIS — R911 Solitary pulmonary nodule: Secondary | ICD-10-CM

## 2013-07-09 DIAGNOSIS — R05 Cough: Secondary | ICD-10-CM

## 2013-07-09 DIAGNOSIS — G4734 Idiopathic sleep related nonobstructive alveolar hypoventilation: Secondary | ICD-10-CM

## 2013-07-09 DIAGNOSIS — R053 Chronic cough: Secondary | ICD-10-CM

## 2013-07-09 DIAGNOSIS — R0902 Hypoxemia: Secondary | ICD-10-CM

## 2013-07-09 DIAGNOSIS — R059 Cough, unspecified: Secondary | ICD-10-CM

## 2013-07-09 DIAGNOSIS — J449 Chronic obstructive pulmonary disease, unspecified: Secondary | ICD-10-CM

## 2013-07-09 NOTE — Patient Instructions (Addendum)
#  obstructive lung disease and dyspnea -monitor clinically for now without inhaler therapy (as per your wish) - if still a problem, can consider cardiac eval as discussed last visit  #Overnight hypoxemia and Restless Leg - dc oxygen at night due to lack of help - refer sleep doc in our office   #Chronic cough - glad resolved  - REduce gabapentin 300mg  twice dialy (further reductions after seeing sleep doc because this medications seems to be helping your restless leg syndrome) - continue  fluticasone inhaler 2 squirts each nostril daily - hypertonic saline nasal spray s squirts each nostril at night - continue prilosec (Dr Collene Mares will consider endoscopy later on depending on response to above)  #48mmRUL Llung nodule Dec 2014  - repeat CT chest wo contrast Jan 2016 (ordered last visit)  #FOllowup 3 months  copd cat score at followup Spirometry at followup

## 2013-07-09 NOTE — Progress Notes (Signed)
Subjective:    Patient ID: Matthew Patrick, male    DOB: Oct 20, 1945, 68 y.o.   MRN: [id number]  HPI  PCP Tivis Ringer, MD   IOV 05/08/2013   Chief Complaint  Patient presents with  . Pulmonary Consult    Pt c/o having a dry cough and SOB since 02-24-13. Pt states he has been on 3 rounds of abx and prednisone since 02-24-13.      REferred for dyspnea and cough  Dyspnea: Insidious onset x 6 to 8 years. Worse past few months. Moderate in severity. Mostly exertional. Relieved by rest. Activitis likes flight or two of stairs or bending bring it on. A year ago same activities noticed it only to a mild extent. No associated chest pain or pnd or edema or weight loss. Walking desaturatin test: 185 feet x 3 laps:PEak HR 98% and lowest pulse ox 92%  Cough: there is associatd cough. STarted few months ago when dyspnea got worse.  Mostly dry but can cough out some mucus. Cough worse when he lies down. Associated clearing of throat +.    Above has resulted in several rounds of zpak and even prednisone but no respnse. Uses asmamnex, nasonex but feels this makes it worse. Also tried spiriva but made things worse. He is puzzled tjhat all Rx make symptoms worse  Dyspnea and Cough releavant hx  BP  - was on ace inhibitors but quit it long time ago (?Years ago)  Sinus  - does have sinus drainage. On nasal steroids; not helping  GI  - has occ GERD. On PPI but not helping  Lungs  -  reports that he quit smoking about 46 years ago. His smoking use included Cigarettes. He has a 5 pack-year smoking history. He does not have any smokeless tobacco history on file.   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 Official report 03/18/13 - LL atlectasis. CXR in our ssystem 03/25/11 - HYPERINFLATED   - spiroemtry today shows obstructive lung disease - fev1 1.8L/51%, RAtio 58  Cards  - normal echo 2012  and doppler carotid at time of syncope  OV 05/08/2013  Here for result review  PFT 04/15/14: PFT shows obstructive lung disesase iwh bronchdolilator response - as in asthma or emphysema just like spiro yesterday but not as bad. Not sure why he is not responding to steroids and inhalers that DR Avva tried.   ONO s12/29/14:  hows 3.5h of desat at noight < 88%. Started on 2L o2 which he says helps with quality of life   CT chest 04/22/13  -  IMPRESSION:  1. The appearance of the lung parenchyma is not suggestive of an  underlying interstitial lung disease at this time.  2. However, there is extensive volume loss throughout the lower  aspects of the lungs bilaterally with what appears to be at  multifocal areas of mild scarring, presumably related to prior  episodes of infection/inflammation.  3. 4 mm right upper lobe nodule is highly nonspecific. If the  patient is at high risk for bronchogenic carcinoma, follow-up chest  CT at 1year is recommended. If the patient is at low risk, no  follow-up is needed. This recommendation follows the consensus  statement: Guidelines for Management of Small Pulmonary Nodules  Detected on CT Scans: A Statement from the [place] as  published in Radiology 2005; 237:395-400.  Electronically Signed  By: Vinnie Langton M.D.  On: 04/22/2013 16:57   sAw GI Dr Collene Mares 05/11/13 - strted on PPI. She wants to hold off on scope till resp issues are optimized  Since starting noctureal o2 and ppi:  he explaiend that nocturana gasps are imprved =with nocurnal o2 and quality of sleep better. He did have bloating. He wondered about UGI scope.  Patient continues to clear his throat a lot. HE feels he might have OSA but apparently tests few years ago ruled this out; wife does not believe test to be accurate.   HE has sinus drainge and not on regular Rx.   REC #obstructive lung disease and dyspnea - try ANORO and Pulmicort - refer pulmonary rehab - if  unimproved cconsider cardiac eval   #Overnight hypoxemia  - continue o2 at night with hypertonic saline spray   - at some point we can look at repeat sleep eval in our office  #Chronic cough  - Take gabapentin 300mg  once daily x 3 days, then 300mg  twice daily x 3 days, then 300mg  three times daily to continue. If this makes you too sleepy or drowsy call us and we will cut your medication dosing down - refer speech therapy with Mr Garald Balding  -  take generic fluticasone inhaler 2 squirts each nostril daily - hypertonic saline nasal spray s squirts each nostril at night - continue prilosec (Dr Collene Mares will consider endoscopy later on depending on response to above)  #57mmRUL Llung nodule Dec 2014  - repeat CT chest wo contrast Jan 2016  #FOllowup  6 weeks  cough score and copd cat score at followup Spirometry at followup   OV 07/09/2013  Chief Complaint  Patient presents with  . Follow-up    Pt states cough is completely gone. Pt does not feel like oxygen at bedtime is heping. he states it causes hime to have nasal congestion and causes him to breathe from his mouth. pt is interestred in a CPAP.     FU chronic cough + Obstructive lung disease that has no subjective improvement with MDI  His cough is completely resolved. He attributes this to Flonase and nasal saline wash. Is not using his Zyrtec anymore. He did not feel any improvement with inhalers for the long and therefore quit using it. He is using his acid reflux treatment. He continues with Neurontin for chronic cough which also has helped. Coincidently the Neurontin resolved as restless leg syndrome which is severe. Wife and he do feel that he needs a sleep reevaluation because of restless leg syndrome and witnessed apneic spells at home which prevent him from using his oxygen. He does not want to use oxygen for overnight hypoxemia because he does not feel subjective help with this as well.   CAT COPD Symptom & Quality of Life  Score (GSK trademark) 0 is no burden. 5 is highest burden 07/09/2013   Never Cough -> Cough all the time 0  No phlegm in chest -> Chest is full of phlegm 0  No chest tightness -> Chest feels very tight 2  No dyspnea for 1 flight stairs/hill -> Very dyspneic for 1 flight of stairs 1  No limitations for ADL at home -> Very limited with ADL at home 0  Confident leaving home -> Not at all confident leaving home 0  Sleep soundly -> Do not sleep soundly because of lung condition 4  Lots of Energy -> No energy at all 0  TOTAL Score (max 40)  7  Review of Systems  Constitutional: Negative for fever and unexpected weight change.  HENT: Negative for congestion, dental problem, ear pain, nosebleeds, postnasal drip, rhinorrhea, sinus pressure, sneezing, sore throat and trouble swallowing.   Eyes: Negative for redness and itching.  Respiratory: Negative for cough, chest tightness, shortness of breath and wheezing.   Cardiovascular: Negative for palpitations and leg swelling.  Gastrointestinal: Negative for nausea and vomiting.  Genitourinary: Negative for dysuria.  Musculoskeletal: Negative for joint swelling.  Skin: Negative for rash.  Neurological: Negative for headaches.  Hematological: Does not bruise/bleed easily.  Psychiatric/Behavioral: Negative for dysphoric mood. The patient is not nervous/anxious.        Objective:   Physical Exam  Nursing note and vitals reviewed. Constitutional: He is oriented to person, place, and time. He appears well-developed and well-nourished. No distress.  HENT:  Head: Normocephalic and atraumatic.  Right Ear: External ear normal.  Left Ear: External ear normal.  Mouth/Throat: Oropharynx is clear and moist. No oropharyngeal exudate.  Improved sinus dreainage  Eyes: Conjunctivae and EOM are normal. Pupils are equal, round, and reactive to light. Right eye exhibits no discharge. Left eye exhibits no discharge. No scleral icterus.  Neck: Normal  range of motion. Neck supple. No JVD present. No tracheal deviation present. No thyromegaly present.  Cardiovascular: Normal rate, regular rhythm and intact distal pulses.  Exam reveals no gallop and no friction rub.   No murmur heard. Pulmonary/Chest: Effort normal and breath sounds normal. No respiratory distress. He has no wheezes. He has no rales. He exhibits no tenderness.  Abdominal: Soft. Bowel sounds are normal. He exhibits no distension and no mass. There is no tenderness. There is no rebound and no guarding.  Musculoskeletal: Normal range of motion. He exhibits no edema and no tenderness.  Lymphadenopathy:    He has no cervical adenopathy.  Neurological: He is alert and oriented to person, place, and time. He has normal reflexes. No cranial nerve deficit. Coordination normal.  Skin: Skin is warm and dry. No rash noted. He is not diaphoretic. No erythema. No pallor.  Psychiatric: He has a normal mood and affect. His behavior is normal. Judgment and thought content normal.          Assessment & Plan:

## 2013-07-10 NOTE — Assessment & Plan Note (Signed)
#  Overnight hypoxemia and Restless Leg - dc oxygen at night due to lack of help - refer sleep doc in our office

## 2013-07-10 NOTE — Assessment & Plan Note (Signed)
#  48mmRUL Llung nodule Dec 2014  - repeat CT chest wo contrast Jan 2016 (ordered last visit)

## 2013-07-10 NOTE — Assessment & Plan Note (Signed)
 #  Chronic cough - glad resolved  - REduce gabapentin 300mg  twice dialy (further reductions after seeing sleep doc because this medications seems to be helping your restless leg syndrome) - continue  fluticasone inhaler 2 squirts each nostril daily - hypertonic saline nasal spray s squirts each nostril at night - continue prilosec (Dr Collene Mares will consider endoscopy later on depending on response to above) #FOllowup 3 months  copd cat score at followup Spirometry at followup

## 2013-07-10 NOTE — Assessment & Plan Note (Signed)
#  obstructive lung disease and dyspnea -monitor clinically for now without inhaler therapy (as per your wish) - if still a problem, can consider cardiac eval as discussed last visit

## 2013-07-11 ENCOUNTER — Ambulatory Visit (INDEPENDENT_AMBULATORY_CARE_PROVIDER_SITE_OTHER): Payer: Medicare Other | Admitting: Pulmonary Disease

## 2013-07-11 ENCOUNTER — Encounter: Payer: Self-pay | Admitting: Pulmonary Disease

## 2013-07-11 VITALS — BP 130/78 | HR 79 | Temp 97.5°F | Ht 70.0 in | Wt 165.6 lb

## 2013-07-11 DIAGNOSIS — G4733 Obstructive sleep apnea (adult) (pediatric): Secondary | ICD-10-CM

## 2013-07-11 NOTE — Progress Notes (Signed)
Subjective:    Patient ID: Matthew Patrick, male    DOB: 05/28/1945, 68 y.o.   MRN: [id number]  HPI  68 year old referred for evaluation of obstructive sleep apnea. He underwent a polysomnogram in 11/2011 when he presented with excessive daytime somnolence, snoring and witnessed apneas and in inability to keep his legs still. Weight was 157 pounds.Total sleep time was 379 minutes, with 33% supine sleep and 19% REM sleep. AHI was 4.1 per hour with RDI of 8.9 events per hour, supine AHI was 12 events per hour with the lowest desaturation of 79%. PLM index was 59 events per hour with PLM arousal index of 8.5 events per hour. His leg movements have improved substantially with Neurontin. He reports deviated septum and mouth breathing, he uses Flonase daily. Wife has witnessed apneas even on his side. Snoring and apneas are worse when he consumes wine close to bedtime. He takes Cymbalta and Ativan twice daily for anxiety and seroquel at bedtime. Nocturnal oximetry in 03/2013 showed desaturations for about 3 hours the lowest of about 77%.  Epworth sleepiness score is 11/24 Bedtime is around 11 PM, sleep latency is about 10 minutes, he sleeps on his side with one pillow on an elevated bed due to his wife's issues, has 3-4 awakenings including 1 bathroom visit and is out of bed by 8 AM with dryness of mouth and tiredness. He has gained 8 pounds since the sleep study.  Past Medical History  Diagnosis Date  . Hypertension   . Hyperlipemia   . Anxiety   . Asthma     Past Surgical History  Procedure Laterality Date  . Neck skin tag biopsy    . 2d echocardiogram  03/27/11  . Tonsillectomy and adenoidectomy    . Cataract extraction      No Known Allergies  History   Social History  . Marital Status: Significant Other    Spouse Name: N/A    Number of Children: N/A  . Years of Education: N/A   Occupational History  . retired    Social History Main Topics  . Smoking status: Former Smoker -- 1.00  packs/day for 5 years    Types: Cigarettes    Quit date: 04/25/1967  . Smokeless tobacco: Not on file  . Alcohol Use: 4.2 oz/week    7 Glasses of wine per week     Comment: 2-4 glasses of wine per night  . Drug Use: No  . Sexual Activity: Not on file   Other Topics Concern  . Not on file   Social History Narrative  . No narrative on file    Family History  Problem Relation Age of Onset  . Emphysema Mother   . Allergies Mother   . Heart disease Father        Review of Systems  Constitutional: Negative for fever and unexpected weight change.  HENT: Negative for congestion, dental problem, ear pain, nosebleeds, postnasal drip, rhinorrhea, sinus pressure, sneezing, sore throat and trouble swallowing.   Eyes: Negative for redness and itching.  Respiratory: Positive for shortness of breath. Negative for cough, chest tightness and wheezing.   Cardiovascular: Negative for palpitations and leg swelling.  Gastrointestinal: Negative for nausea and vomiting.  Genitourinary: Negative for dysuria.  Musculoskeletal: Negative for joint swelling.  Skin: Negative for rash.  Neurological: Negative for headaches.  Hematological: Does not bruise/bleed easily.  Psychiatric/Behavioral: Negative for dysphoric mood. The patient is not nervous/anxious.        Objective:   Physical  Exam  Gen. Pleasant, well-nourished, in no distress, normal affect ENT - no lesions, no post nasal drip, class 2 a irway Neck: No JVD, no thyromegaly, no carotid bruits Lungs: no use of accessory muscles, no dullness to percussion, clear without rales or rhonchi  Cardiovascular: Rhythm regular, heart sounds  normal, no murmurs or gallops, no peripheral edema Abdomen: soft and non-tender, no hepatosplenomegaly, BS normal. Musculoskeletal: No deformities, no cyanosis or clubbing Neuro:  alert, non focal        Assessment & Plan:

## 2013-07-11 NOTE — Assessment & Plan Note (Signed)
Home sleep study We will set up CPAP study after this Given severe hypoxia, would prefer CPAP titration in lab  Given excessive daytime somnolence, narrow pharyngeal exam, witnessed apneas & loud snoring, obstructive sleep apnea is very likely & an overnight polysomnogram will be scheduled as a home study. The pathophysiology of obstructive sleep apnea , it's cardiovascular consequences & modes of treatment including CPAP were discused with the patient in detail & they evidenced understanding.

## 2013-07-11 NOTE — Patient Instructions (Signed)
Home sleep study We will set up CPAP study after this

## 2013-08-22 ENCOUNTER — Other Ambulatory Visit: Payer: Self-pay | Admitting: Internal Medicine

## 2013-08-26 ENCOUNTER — Telehealth: Payer: Self-pay | Admitting: Internal Medicine

## 2013-08-26 DIAGNOSIS — G4733 Obstructive sleep apnea (adult) (pediatric): Secondary | ICD-10-CM

## 2013-08-26 DIAGNOSIS — J449 Chronic obstructive pulmonary disease, unspecified: Secondary | ICD-10-CM

## 2013-08-26 NOTE — Telephone Encounter (Signed)
Per 3.18.15 ov w/ MR: Patient Instructions     #obstructive lung disease and dyspnea  -monitor clinically for now without inhaler therapy (as per your wish)  - if still a problem, can consider cardiac eval as discussed last visit  #Overnight hypoxemia and Restless Leg  - dc oxygen at night due to lack of help  - refer sleep doc in our office  #Chronic cough  - glad resolved  - REduce gabapentin 300mg  twice dialy (further reductions after seeing sleep doc because this medications seems to be helping your restless leg syndrome)  - continue fluticasone inhaler 2 squirts each nostril daily  - hypertonic saline nasal spray s squirts each nostril at night  - continue prilosec (Dr Collene Mares will consider endoscopy later on depending on response to above)  #17mmRUL Llung nodule Dec 2014  - repeat CT chest wo contrast Jan 2016 (ordered last visit)  #FOllowup  3 months  copd cat score at followup  Spirometry at followup   Per pt's chart, it does not appear that the order was sent to [facility] to d/c pt's home oxygen.  Called [facility] and spoke with Anitra who verified that an order was never received.  Order sent to [facility] to d/c his O2 at bedtime as stated by MR above with note that this was to have been done at the 3.18.15 ov.  Called spoke with patient, apologized for the inconvenience.  Pt was very understanding.  Pt is aware that the order has been sent to [facility] with a note that this was originally to have been ordered at the 3.18.15 ov.  Advised pt that if he does not hear from [facility] by the end of the week, to please call the office so that we may follow up.  Pt verbalized his understanding.    Nothing further needed at this time; will sign off.

## 2013-08-27 ENCOUNTER — Telehealth: Payer: Self-pay | Admitting: Pulmonary Disease

## 2013-08-27 DIAGNOSIS — G4710 Hypersomnia, unspecified: Secondary | ICD-10-CM

## 2013-08-27 DIAGNOSIS — G4733 Obstructive sleep apnea (adult) (pediatric): Secondary | ICD-10-CM

## 2013-08-27 DIAGNOSIS — G4730 Sleep apnea, unspecified: Secondary | ICD-10-CM

## 2013-08-27 NOTE — Telephone Encounter (Signed)
HST showed OSA  -stopped breathing 10/h Proceed with CPAP titration study, if agreeable

## 2013-08-28 ENCOUNTER — Encounter: Payer: Self-pay | Admitting: Pulmonary Disease

## 2013-08-28 NOTE — Telephone Encounter (Signed)
I spoke with patient about results and he verbalized understanding and had no questions Order placed 

## 2013-09-17 ENCOUNTER — Ambulatory Visit (HOSPITAL_BASED_OUTPATIENT_CLINIC_OR_DEPARTMENT_OTHER): Payer: Medicare Other | Attending: Pulmonary Disease | Admitting: Radiology

## 2013-09-17 VITALS — Ht 70.0 in | Wt 162.0 lb

## 2013-09-17 DIAGNOSIS — G4733 Obstructive sleep apnea (adult) (pediatric): Secondary | ICD-10-CM

## 2013-09-17 DIAGNOSIS — G4761 Periodic limb movement disorder: Secondary | ICD-10-CM | POA: Insufficient documentation

## 2013-09-29 ENCOUNTER — Telehealth: Payer: Self-pay | Admitting: Pulmonary Disease

## 2013-09-29 DIAGNOSIS — J449 Chronic obstructive pulmonary disease, unspecified: Secondary | ICD-10-CM

## 2013-09-29 DIAGNOSIS — G4730 Sleep apnea, unspecified: Secondary | ICD-10-CM

## 2013-09-29 DIAGNOSIS — G4710 Hypersomnia, unspecified: Secondary | ICD-10-CM

## 2013-09-29 NOTE — Telephone Encounter (Signed)
CPAP 12 cm, med FF mask, download in 23month Rx sent

## 2013-09-29 NOTE — Sleep Study (Signed)
[place]  NAME: Matthew Patrick  DATE OF BIRTH: 1946-02-06  MEDICAL RECORD NUMBER [id number]  LOCATION: [facility]  PHYSICIAN: Rigoberto Noel MD DATE OF STUDY: 09/17/13   SLEEP STUDY TYPE: CPAP titration study               REFERRING PHYSICIAN: Rigoberto Noel, MD  INDICATION FOR STUDY: 68 year old referred for evaluation of obstructive sleep apnea.  He underwent a polysomnogram in 11/2011 when he presented with excessive daytime somnolence, snoring and witnessed apneas and in inability to keep his legs still. Weight was 157 pounds.Total sleep time was 379 minutes, with 33% supine sleep and 19% REM sleep. AHI was 4.1 per hour with RDI of 8.9 events per hour, supine AHI was 12 events per hour with the lowest desaturation of 79%. PLM index was 59 events per hour with PLM arousal index of 8.5 events per hour. HST 08/27/2013 showed AHI 10/h  At the time of this study ,they weighed 165 pounds with a height of  5 ft 10 inches and the BMI of 24, neck size of 15 inches. Epworth sleepiness score was 12   This CPAP titration polysomnogram was performed with a sleep technologist in attendance. EEG, EOG,EMG and respiratory parameters recorded. Sleep stages, arousals, limb movements and respiratory data was scored according to criteria laid out by the American Academy of sleep medicine.  SLEEP ARCHITECTURE: Lights out was at 2142 PM and lights on was at 506 AM. Total sleep time was 317 minutes with sleep period time of 38 minutes and sleep efficiency of 71 .Sleep latency was 56 minutes with latency to REM sleep of 337 minutes and wake after sleep onset of 72 minutes.  Sleep stages as a percentage of total sleep time was N1 -5 %,N2- 94.5 % and REM sleep 0.6 % ( 2 minutes) . The longest period of REM sleep was around 4 AM.   AROUSAL DATA : There were 139 arousals with an arousal index of 26 events per hour. Of these 34 were spontaneous, and 26 were associated with  respiratory events and 77 were associated periodic limb movements  RESPIRATORY DATA: CPAP was initiated at 6 centimeters and titrated to a final level of 12 centimeters due to respiratory events and snoring, especially during supine sleep. At the final level of 12 centimeters for 24 minutes, there were 0 obstructive apneas, 0 central apneas, 0 mixed apneas and 2 hypopneas with apnea -hypopnea index of 4.9 events per hour.  He went seem to emerge during supine sleep. Titration was optimal.  MOVEMENT/PARASOMNIA: There were 340 PLMS with a PLM index of 64 events per hour. The PLM arousal index was 14 events per hour.  OXYGEN DATA: The lowest desaturation was 86 % during non-REM sleep and the desaturation index was 7 per hour. The saturations stayed below 88% for 1.8 minutes.  CARDIAC DATA: The low heart rate was 50 beats per minute. The high heart rate recorded was an artifact. No arrhythmias were noted   IMPRESSION :  1. mild obstructive sleep apnea with hypopneas causing sleep fragmentation and mild oxygen desaturation. 2. This was corrected by CPAP of 12 centimeters with a medium fullface mask. Titration was optimal. 3. No evidence of cardiac arrhythmias or behavioral disturbance during sleep. 4. Periodic limb movements were noted and were associated with arousals.  RECOMMENDATION:    1. The treatment options for this degree of sleep disordered breathing includes weight loss and/or CPAP therapy. CPAP can  be initiated at 12 centimeters with a medium fullface mask and compliance monitored at this level. 2. Patient should be cautioned against driving when sleepy 3. They should be asked to avoid medications with sedative side effects 4. consider evaluation and treatment for periodic limb movements during sleep  Rigoberto Noel  MD Diplomate, American Board of Sleep Medicine  ELECTRONICALLY SIGNED ON:  09/29/2013  [facility] PH: [phone]   FX: [phone]  [phone] [place]

## 2013-09-30 NOTE — Telephone Encounter (Signed)
Order was sent to [facility] 09/29/13. lmomtcb x1 for pt Will also need OV in 1 month

## 2013-09-30 NOTE — Telephone Encounter (Signed)
I spoke with patient about results and he verbalized understanding and had no questions appt scheduled 11/12/13

## 2013-09-30 NOTE — Telephone Encounter (Signed)
[phone] calling back

## 2013-10-06 ENCOUNTER — Telehealth: Payer: Self-pay | Admitting: Pulmonary Disease

## 2013-10-06 NOTE — Telephone Encounter (Signed)
LMOM x 1 

## 2013-10-07 NOTE — Telephone Encounter (Signed)
Spoke with Iris at [facility] and she states that they were waiting on an authorization from [facility] and they just heard back from them 30 minutes ago with this.  They are going to call pt to get this set up.  Spoke with pt and advised they would be calling him to get CPAP set up.

## 2013-10-07 NOTE — Telephone Encounter (Signed)
Called [facility] to see what is going on [phone] i had to leave message with answering service. Called pt to make aware we are working on this Will await [facility] call back

## 2013-10-07 NOTE — Telephone Encounter (Signed)
Please call [phone]

## 2013-10-13 ENCOUNTER — Encounter (HOSPITAL_BASED_OUTPATIENT_CLINIC_OR_DEPARTMENT_OTHER): Payer: Medicare Other

## 2013-11-12 ENCOUNTER — Ambulatory Visit (INDEPENDENT_AMBULATORY_CARE_PROVIDER_SITE_OTHER): Payer: Medicare Other | Admitting: Pulmonary Disease

## 2013-11-12 ENCOUNTER — Encounter: Payer: Self-pay | Admitting: Pulmonary Disease

## 2013-11-12 VITALS — BP 128/70 | HR 78 | Temp 97.8°F | Ht 70.0 in | Wt 165.4 lb

## 2013-11-12 DIAGNOSIS — G4733 Obstructive sleep apnea (adult) (pediatric): Secondary | ICD-10-CM

## 2013-11-12 NOTE — Assessment & Plan Note (Addendum)
2 month download & revisit Check iron levels- iron/TIBC given restless legs Will persist with current level of CPAP since he has significant symptomatic improvement even though events persist Weight loss encouraged, compliance with goal of at least 4-6 hrs every night is the expectation. Advised against medications with sedative side effects Cautioned against driving when sleepy - understanding that sleepiness will vary on a day to day basis

## 2013-11-12 NOTE — Patient Instructions (Signed)
2 month download & revisit Check iron levels- iron/TIBC

## 2013-11-12 NOTE — Progress Notes (Signed)
   Subjective:    Patient ID: Matthew Patrick, male    DOB: 1945-08-25, 68 y.o.   MRN: [id number]  HPI 68 year old for FUof obstructive sleep apnea.  He underwent a polysomnogram in 11/2011 when he presented with excessive daytime somnolence, snoring and witnessed apneas and in inability to keep his legs still. Weight was 157 pounds.Total sleep time was 379 minutes, with 33% supine sleep and 19% REM sleep. AHI was 4.1 per hour with RDI of 8.9 events per hour, supine AHI was 12 events per hour with the lowest desaturation of 79%. PLM index was 59 events per hour with PLM arousal index of 8.5 events per hour.  He takes Cymbalta and Ativan twice daily for anxiety and seroquel at bedtime. Nocturnal oximetry in 03/2013 showed desaturations for about 3 hours the lowest of about 77%.   HST showed OSA -stopped breathing 10/h CPAP 12 cm, med FF mask, download in 28month PLMs persisted although he reports His leg movements have improved substantially with Neurontin.  Chief Complaint  Patient presents with  . Follow-up    download recved. Pt reports mask is very uncomfortable and leaks at times. Pt wears CPAP nightly. Pt has tried severla masks. currently has FF.    1 mnth download >> on 12 cm, leak ++, AHI 18/h, excellent usage     Review of Systems neg for any significant sore throat, dysphagia, itching, sneezing, nasal congestion or excess/ purulent secretions, fever, chills, sweats, unintended wt loss, pleuritic or exertional cp, hempoptysis, orthopnea pnd or change in chronic leg swelling. Also denies presyncope, palpitations, heartburn, abdominal pain, nausea, vomiting, diarrhea or change in bowel or urinary habits, dysuria,hematuria, rash, arthralgias, visual complaints, headache, numbness weakness or ataxia.     Objective:   Physical Exam   Gen. Pleasant, well-nourished, in no distress ENT - no lesions, no post nasal drip Neck: No JVD, no thyromegaly, no carotid bruits Lungs: no use of  accessory muscles, no dullness to percussion, clear without rales or rhonchi  Cardiovascular: Rhythm regular, heart sounds  normal, no murmurs or gallops, no peripheral edema Musculoskeletal: No deformities, no cyanosis or clubbing         Assessment & Plan:

## 2013-12-15 ENCOUNTER — Encounter: Payer: Self-pay | Admitting: Pulmonary Disease

## 2013-12-15 DIAGNOSIS — G4733 Obstructive sleep apnea (adult) (pediatric): Secondary | ICD-10-CM

## 2013-12-15 NOTE — Telephone Encounter (Signed)
RA, Please advise on patients questions/concerns about his CPAP mask. He did provide a link to one he found on-line. Thanks.

## 2013-12-16 NOTE — Telephone Encounter (Signed)
Per pt send an order. Order placed. [place] Bing, CMA

## 2013-12-16 NOTE — Telephone Encounter (Signed)
OK for him to try this mask Send Rx if needed -he can order online. If that does not work, can set up mask fitting sessiona t our sleep lab

## 2013-12-16 NOTE — Addendum Note (Signed)
Addended by: Lilli Few on: 12/16/2013 05:22 PM   Modules accepted: Orders

## 2014-01-01 ENCOUNTER — Other Ambulatory Visit: Payer: Self-pay | Admitting: Internal Medicine

## 2014-01-13 ENCOUNTER — Ambulatory Visit (INDEPENDENT_AMBULATORY_CARE_PROVIDER_SITE_OTHER): Payer: Medicare Other | Admitting: Pulmonary Disease

## 2014-01-13 ENCOUNTER — Encounter: Payer: Self-pay | Admitting: Pulmonary Disease

## 2014-01-13 VITALS — BP 120/80 | HR 78 | Temp 98.0°F | Ht 70.0 in | Wt 169.0 lb

## 2014-01-13 DIAGNOSIS — G4733 Obstructive sleep apnea (adult) (pediatric): Secondary | ICD-10-CM

## 2014-01-13 NOTE — Patient Instructions (Addendum)
Call sleep lab [phone] for mask fitting appt - You can even try a nasal mask with chin strap CPAP pressure will be increased & download rechecked in 1 month

## 2014-01-13 NOTE — Progress Notes (Signed)
   Subjective:    Patient ID: Matthew Patrick, male    DOB: 02-04-1946, 68 y.o.   MRN: [id number]  HPI  68 year old for FUof obstructive sleep apnea.  He underwent a polysomnogram in 11/2011 when he presented with excessive daytime somnolence, snoring and witnessed apneas and in inability to keep his legs still. Weight was 157 pounds.Total sleep time was 379 minutes, with 33% supine sleep and 19% REM sleep. AHI was 4.1 per hour with RDI of 8.9 events per hour, supine AHI was 12 events per hour with the lowest desaturation of 79%. PLM index was 59 events per hour with PLM arousal index of 8.5 events per hour.  He takes Cymbalta and Ativan twice daily for anxiety and seroquel at bedtime. Nocturnal oximetry in 03/2013 showed desaturations for about 3 hours the lowest of about 77%.  HST showed OSA -stopped breathing 10/h  CPAP 12 cm, med FF mask, download in 31month  PLMs persisted although he reports His leg movements have improved substantially with Neurontin.   1 mnth download >> on 12 cm, leak ++, AHI 18/h, excellent usage   Chief Complaint  Patient presents with  . Follow-up    Wears CPAP nightly. Pt reports that he is not tolerating the mask very well, would like a different mask. Pressure seems to be okay at this time. Pt states that when he uses humidifier on CPAP he notices having increased congestion. Pt would like to know if this is normal and if machine can be used without humidiifer.   Continues to have leak with FF mask - note beard, this wakes him up sometimes Overall doing well, less sleepiness ,PLMs have improved on neurontin Wonders if humidity is causing nasal drainage  Download 68 month 12/2012  -residual AHI 20/h on 12 cm, large leak +, good usage     Review of Systems neg for any significant sore throat, dysphagia, itching, sneezing, nasal congestion or excess/ purulent secretions, fever, chills, sweats, unintended wt loss, pleuritic or exertional cp, hempoptysis, orthopnea pnd  or change in chronic leg swelling. Also denies presyncope, palpitations, heartburn, abdominal pain, nausea, vomiting, diarrhea or change in bowel or urinary habits, dysuria,hematuria, rash, arthralgias, visual complaints, headache, numbness weakness or ataxia.      Objective:   Physical Exam  Gen. Pleasant, well-nourished, in no distress ENT - no lesions, no post nasal drip Neck: No JVD, no thyromegaly, no carotid bruits Lungs: no use of accessory muscles, no dullness to percussion, clear without rales or rhonchi  Cardiovascular: Rhythm regular, heart sounds  normal, no murmurs or gallops, no peripheral edema Musculoskeletal: No deformities, no cyanosis or clubbing        Assessment & Plan:

## 2014-01-13 NOTE — Assessment & Plan Note (Signed)
Call sleep lab [phone] for mask fitting appt - You can even try a nasal mask with chin strap Unclear why still has residuals  - CPAP pressure will be increased to auto 10-15 & download rechecked in 1 month OK to leave out humidifier  Weight loss encouraged, compliance with goal of at least 4-6 hrs every night is the expectation. Advised against medications with sedative side effects Cautioned against driving when sleepy - understanding that sleepiness will vary on a day to day basis

## 2014-01-21 ENCOUNTER — Ambulatory Visit (HOSPITAL_BASED_OUTPATIENT_CLINIC_OR_DEPARTMENT_OTHER): Payer: Medicare Other | Attending: Pulmonary Disease | Admitting: Radiology

## 2014-01-21 DIAGNOSIS — G4733 Obstructive sleep apnea (adult) (pediatric): Secondary | ICD-10-CM

## 2014-01-21 DIAGNOSIS — Z9989 Dependence on other enabling machines and devices: Principal | ICD-10-CM

## 2014-03-24 ENCOUNTER — Telehealth: Payer: Self-pay | Admitting: Pulmonary Disease

## 2014-03-24 NOTE — Telephone Encounter (Signed)
Patient notified.  Patient states that he is doing much better using the nasal cannula instead of the mask.  No complaints.  Nothing further needed.

## 2014-03-24 NOTE — Telephone Encounter (Signed)
On auto 10-15cm Average for 12 cm Good usage on currently settings.

## 2014-04-06 ENCOUNTER — Other Ambulatory Visit: Payer: Self-pay | Admitting: Internal Medicine

## 2014-04-11 ENCOUNTER — Other Ambulatory Visit: Payer: Self-pay | Admitting: Internal Medicine

## 2014-05-11 ENCOUNTER — Inpatient Hospital Stay: Admission: RE | Admit: 2014-05-11 | Payer: Medicare Other | Source: Ambulatory Visit

## 2014-05-13 ENCOUNTER — Ambulatory Visit (INDEPENDENT_AMBULATORY_CARE_PROVIDER_SITE_OTHER)
Admission: RE | Admit: 2014-05-13 | Discharge: 2014-05-13 | Disposition: A | Discharge status: Home / Self Care | Payer: Medicare Other | Source: Ambulatory Visit | Attending: Internal Medicine | Admitting: Internal Medicine

## 2014-05-13 DIAGNOSIS — R911 Solitary pulmonary nodule: Secondary | ICD-10-CM

## 2014-05-22 ENCOUNTER — Encounter: Payer: Self-pay | Admitting: Internal Medicine

## 2014-05-22 ENCOUNTER — Ambulatory Visit (INDEPENDENT_AMBULATORY_CARE_PROVIDER_SITE_OTHER): Payer: Medicare Other | Admitting: Internal Medicine

## 2014-05-22 ENCOUNTER — Telehealth: Payer: Self-pay | Admitting: Internal Medicine

## 2014-05-22 VITALS — BP 154/82 | HR 77 | Ht 70.0 in | Wt 173.0 lb

## 2014-05-22 DIAGNOSIS — J449 Chronic obstructive pulmonary disease, unspecified: Secondary | ICD-10-CM

## 2014-05-22 DIAGNOSIS — IMO0001 Reserved for inherently not codable concepts without codable children: Secondary | ICD-10-CM | POA: Insufficient documentation

## 2014-05-22 DIAGNOSIS — R053 Chronic cough: Secondary | ICD-10-CM

## 2014-05-22 DIAGNOSIS — R05 Cough: Secondary | ICD-10-CM

## 2014-05-22 DIAGNOSIS — R911 Solitary pulmonary nodule: Secondary | ICD-10-CM | POA: Insufficient documentation

## 2014-05-22 MED ORDER — TIOTROPIUM BROMIDE MONOHYDRATE 18 MCG IN CAPS: 18.0000 ug | ORAL_CAPSULE | Freq: Every day | RESPIRATORY_TRACT | Status: DC

## 2014-05-22 MED ORDER — ALBUTEROL SULFATE HFA 108 (90 BASE) MCG/ACT IN AERS: 2.0000 | INHALATION_SPRAY | Freq: Four times a day (QID) | RESPIRATORY_TRACT | Status: DC | PRN

## 2014-05-22 NOTE — Addendum Note (Signed)
Addended by: Maurice March on: 05/22/2014 11:57 AM   Modules accepted: Orders

## 2014-05-22 NOTE — Telephone Encounter (Signed)
Let Matthew Patrick know that Dr Elsworth Soho advised to stay on neurontin for now. THEre are other alternatives for restless legs and he can talk to this about Dr Elsworth Soho during followup

## 2014-05-22 NOTE — Progress Notes (Signed)
Subjective:    Patient ID: Matthew Patrick, male    DOB: April 25, 1945, 69 y.o.   MRN: [id number]  HPI   PROB LIST  - obstructive lung disease nos with low dlco (paint fumes + remote smoking)  - never had subjective improvementt iwht MDI - chronic multifactorial cough  - sinus  - gerd: PPI  0- irritable larynx - started gabapentin early 2015/late 2014 with good rsult, s/P: speech Rx - Lung nodule with hx of remote smoking  -  40mm nodule RUL - no change dec 2014 through Jan 2016 - no fu for this . OSA with restless legs  - sees [place]. STarted cpap 2015 mid  - resltess legs improved with gabapentin for cough   OV 05/22/2014  Chief Complaint  Patient presents with  . Follow-up    Pt stated his breathing is much improved since starting on CPAP. Pt denies cough, SOB and CP/tightness.     FU for all of above issues  1. Obstructive lung disease: Given lack of subjective improvement with maintenance bronchodilators and steroids. He has stopped using this completely. He only uses albuterol as needed. Every few weeks he does have some chest tightness for which she uses albuterol as rescue. He does not feel the need to take any further inhalers on a scheduled basis. Since I last saw him in March 2015 he has had a diagnosis of sleep apnea made and he is on CPAP which she feels better significant impact in his symptoms and he attributes most of the improvement due to diagnosis and management of sleep apnea. Spirometry today shows continued obstruction at moderate severity he did FEV1 2.3 L/65%, FVC 3.3 L/73%, ratio 69 (  Similar to dec 2014). Of note, did have a cold and took him long time to recover per wife  2. Lung nodule 4 mm right upper lobe: 1 you CT scan of the chest done January 2060 shows this is stable but patient has new  5 mm pulmonary nodule in the inferior aspect of the lingula - Jan  2016    3. Chronic cough: This continues to be in remission with the help of gabapentin and Flonase and  acid reflux treatment. He has now been on gabapentin for for at least a year. We discussed stopping this gradually which he is open to but his wife does not want him to stop his gabapentin ago she worries that his restless leg will come back and rt he will start kicking her in  bed  Smoking:  reports that he quit smoking about 47 years ago. His smoking use included Cigarettes. He has a 5 pack-year smoking history. He does not have any smokeless tobacco history on file.   Past, Family, Social reviewed: no change since last visit  Review of Systems  Constitutional: Negative for fever and unexpected weight change.  HENT: Negative for congestion, dental problem, ear pain, nosebleeds, postnasal drip, rhinorrhea, sinus pressure, sneezing, sore throat and trouble swallowing.   Eyes: Negative for redness and itching.  Respiratory: Negative for cough, chest tightness, shortness of breath and wheezing.   Cardiovascular: Positive for palpitations. Negative for leg swelling.  Gastrointestinal: Negative for nausea and vomiting.  Genitourinary: Negative for dysuria.  Musculoskeletal: Negative for joint swelling.  Skin: Negative for rash.  Neurological: Negative for headaches.  Hematological: Does not bruise/bleed easily.  Psychiatric/Behavioral: Negative for dysphoric mood. The patient is not nervous/anxious.     Current outpatient prescriptions:  .  albuterol (PROVENTIL HFA;VENTOLIN HFA)  108 (90 BASE) MCG/ACT inhaler, Inhale 2 puffs into the lungs every 6 (six) hours as needed for wheezing or shortness of breath., Disp: , Rfl:  .  aspirin 81 MG chewable tablet, Chew 81 mg by mouth daily.  , Disp: , Rfl:  .  DULoxetine (CYMBALTA) 60 MG capsule, Take 60 mg by mouth daily., Disp: , Rfl:  .  fluticasone (FLONASE) 50 MCG/ACT nasal spray, Place 2 sprays into both nostrils daily., Disp: , Rfl:  .  gabapentin (NEURONTIN) 300 MG capsule, TAKE ONE CAPSULE BY MOUTH TWICE A DAY, Disp: 60 capsule, Rfl: 2 .   ibuprofen (ADVIL,MOTRIN) 600 MG tablet, Take 600 mg by mouth every 6 (six) hours as needed. For headache , Disp: , Rfl:  .  LORazepam (ATIVAN) 1 MG tablet, Take 1 mg by mouth 2 (two) times daily. , Disp: , Rfl:  .  mirtazapine (REMERON) 15 MG tablet, Take 22 mg by mouth at bedtime., Disp: , Rfl:  .  QUEtiapine (SEROQUEL) 25 MG tablet, Take 25 mg by mouth at bedtime., Disp: , Rfl:       Objective:   Physical Exam  Constitutional: He is oriented to person, place, and time. He appears well-developed and well-nourished. No distress.  HENT:  Head: Normocephalic and atraumatic.  Right Ear: External ear normal.  Left Ear: External ear normal.  Mouth/Throat: Oropharynx is clear and moist. No oropharyngeal exudate.  Mild post nasal drainge +  Eyes: Conjunctivae and EOM are normal. Pupils are equal, round, and reactive to light. Right eye exhibits no discharge. Left eye exhibits no discharge. No scleral icterus.  Neck: Normal range of motion. Neck supple. No JVD present. No tracheal deviation present. No thyromegaly present.  Cardiovascular: Normal rate, regular rhythm and intact distal pulses.  Exam reveals no gallop and no friction rub.   No murmur heard. Pulmonary/Chest: Effort normal and breath sounds normal. No respiratory distress. He has no wheezes. He has no rales. He exhibits no tenderness.  Abdominal: Soft. Bowel sounds are normal. He exhibits no distension and no mass. There is no tenderness. There is no rebound and no guarding.  Musculoskeletal: Normal range of motion. He exhibits no edema or tenderness.  Lymphadenopathy:    He has no cervical adenopathy.  Neurological: He is alert and oriented to person, place, and time. He has normal reflexes. No cranial nerve deficit. Coordination normal.  Skin: Skin is warm and dry. No rash noted. He is not diaphoretic. No erythema. No pallor.  Psychiatric: He has a normal mood and affect. His behavior is normal. Judgment and thought content normal.    Nursing note and vitals reviewed.   Filed Vitals:   05/22/14 1104  BP: 154/82  Pulse: 77  Height: 5\' 10"  (1.778 m)  Weight: 173 lb (78.472 kg)  SpO2: 98%           ICD-9-CM ICD-10-CM   1. Chronic cough 786.2 R05   2. Chronic obstructive pulmonary disease, unspecified COPD, unspecified chronic bronchitis type 496 J44.9 Spirometry with Graph  3. Lung nodule < 6cm on CT 793.11 R91.1    Assessment & Plan:    #obstructive lung disease - stable disease - even if inhalers do not make you feel better you definitely need maintenance inhaler for downside protection  - Please start spiriva 1 puff daily - take sample, script and show technique   #Chronic cough - glad resolved  -  continue  fluticasone inhaler 2 squirts each nostril daily -  saline nasal spray  s squirts each nostril at night - will d.w Dr Elsworth Soho about gradually stopping gabapentin  #58mmRUL Llung nodule Dec 2014  #new 41mm Lingula nodule - left side - JAn 2016  - repeat CT chest wo contrast in 8 months (might have prior order)   #FOllowup 8 months after ct chest Spirometry at followup   Dr. Brand Males, M.D., [facility].C.P Pulmonary and Critical Care Medicine Staff Physician [place] [facility] Pager: [phone], If no answer or between  15:00h - 7:00h: call [phone]  05/22/2014 11:49 AM

## 2014-05-22 NOTE — Telephone Encounter (Signed)
He did have significant leg movements during sleep - consistent with his diagnosis of restless legs. Neurontin does help this- in fact, there is a long-acting formulation called horizant I would leave him on this Of course there are other medications, such as Requip

## 2014-05-22 NOTE — Patient Instructions (Addendum)
#  obstructive lung disease - stable disease - even if inhalers do not make you feel better you definitely need maintenance inhaler for downside protection  - Please start spiriva 1 puff daily - take sample, script and show technique   #Chronic cough - glad resolved  -  continue  fluticasone inhaler 2 squirts each nostril daily -  saline nasal spray s squirts each nostril at night - will d.w Dr Elsworth Soho about gradually stopping gabapentin  #14mmRUL Llung nodule Dec 2014  #new 58mm Lingula nodule - left side - JAn 2016  - repeat CT chest wo contrast in 8 months (might have prior order)   #FOllowup 8 months after ct chest Spirometry at followup

## 2014-05-22 NOTE — Telephone Encounter (Signed)
Matthew Patrick  I started gabapentin >  1 year ago for chronic cough with good result. Time to taper off but wife says it helped his restless legs even before cpap was started and she does not want him to stop it. What is your take?  THanks  Dr. Brand Males, M.D., [facility].C.P Pulmonary and Critical Care Medicine Staff Physician [place] Pager: [phone], If no answer or between  15:00h - 7:00h: call [phone]  05/22/2014 11:25 AM

## 2014-05-22 NOTE — Addendum Note (Signed)
Addended by: Maurice March on: 05/22/2014 11:55 AM   Modules accepted: Orders

## 2014-05-25 NOTE — Telephone Encounter (Signed)
Called and spoke to pt. Informed pt of the recs per RA. Pt verbalized understanding and denied any further questions or concerns at this time.   

## 2014-07-08 ENCOUNTER — Other Ambulatory Visit: Payer: Self-pay | Admitting: Internal Medicine

## 2014-08-01 ENCOUNTER — Other Ambulatory Visit: Payer: Self-pay | Admitting: Internal Medicine

## 2014-09-07 ENCOUNTER — Telehealth: Payer: Self-pay | Admitting: Pulmonary Disease

## 2014-09-07 DIAGNOSIS — G4733 Obstructive sleep apnea (adult) (pediatric): Secondary | ICD-10-CM

## 2014-09-07 NOTE — Telephone Encounter (Signed)
LMTCB x 1 

## 2014-09-08 NOTE — Telephone Encounter (Signed)
Called and spoke to pt. Pt requesting CPAP supplies (nasal pillow and tubing). Order placed. Pt has OV recall for 12/2014. Pt verbalized understanding and denied any further questions or concerns at this time.

## 2014-10-03 ENCOUNTER — Other Ambulatory Visit: Payer: Self-pay | Admitting: Internal Medicine

## 2014-12-31 ENCOUNTER — Ambulatory Visit (INDEPENDENT_AMBULATORY_CARE_PROVIDER_SITE_OTHER)
Admission: RE | Admit: 2014-12-31 | Discharge: 2014-12-31 | Disposition: A | Discharge status: Home / Self Care | Payer: Medicare Other | Source: Ambulatory Visit | Attending: Internal Medicine | Admitting: Internal Medicine

## 2014-12-31 DIAGNOSIS — R911 Solitary pulmonary nodule: Secondary | ICD-10-CM

## 2014-12-31 DIAGNOSIS — IMO0001 Reserved for inherently not codable concepts without codable children: Secondary | ICD-10-CM

## 2014-12-31 DIAGNOSIS — J449 Chronic obstructive pulmonary disease, unspecified: Secondary | ICD-10-CM | POA: Diagnosis not present

## 2015-01-19 ENCOUNTER — Encounter: Payer: Self-pay | Admitting: Internal Medicine

## 2015-01-19 ENCOUNTER — Ambulatory Visit (INDEPENDENT_AMBULATORY_CARE_PROVIDER_SITE_OTHER): Payer: Medicare Other | Admitting: Internal Medicine

## 2015-01-19 VITALS — BP 158/64 | HR 87 | Temp 98.1°F | Ht 70.0 in | Wt 172.0 lb

## 2015-01-19 DIAGNOSIS — R05 Cough: Secondary | ICD-10-CM

## 2015-01-19 DIAGNOSIS — G4733 Obstructive sleep apnea (adult) (pediatric): Secondary | ICD-10-CM | POA: Diagnosis not present

## 2015-01-19 DIAGNOSIS — J449 Chronic obstructive pulmonary disease, unspecified: Secondary | ICD-10-CM

## 2015-01-19 DIAGNOSIS — Z23 Encounter for immunization: Secondary | ICD-10-CM

## 2015-01-19 DIAGNOSIS — R059 Cough, unspecified: Secondary | ICD-10-CM | POA: Insufficient documentation

## 2015-01-19 DIAGNOSIS — R911 Solitary pulmonary nodule: Secondary | ICD-10-CM

## 2015-01-19 DIAGNOSIS — G2581 Restless legs syndrome: Secondary | ICD-10-CM

## 2015-01-19 DIAGNOSIS — IMO0001 Reserved for inherently not codable concepts without codable children: Secondary | ICD-10-CM

## 2015-01-19 MED ORDER — GABAPENTIN 100 MG PO CAPS: 200.0000 mg | ORAL_CAPSULE | Freq: Every day | ORAL | Status: DC

## 2015-01-19 NOTE — Patient Instructions (Addendum)
ICD-9-CM ICD-10-CM   1. Chronic obstructive pulmonary disease, unspecified COPD, unspecified chronic bronchitis type 496 J44.9   2. Cough 786.2 R05 Spirometry with graph  3. OSA (obstructive sleep apnea) 327.23 G47.33   4. Restless leg 333.94 G25.81   5. Lung nodule < 6cm on CT 793.11 R91.1     #obst lung disease  - Lung function is stable/slightly improved - Continue Spiriva daily - Flu shot today 01/19/2015  #Cough From the standpoint he did not need gabapentin anymore because this is resolved  #Obstructive sleep apnea with restless leg - Cut down her gabapentin to 200 mg once daily at night; take new prescription from office assistant - Recommend he make an appointment with Dr. Elsworth Soho - Continue CPAP  #Lung nodule(s)  - Repeat CT scan of the chest without contrast in 1 year  #Follow-up - CT scan of the chest in 1 year - Follow-up to see me in one year

## 2015-01-19 NOTE — Progress Notes (Signed)
Subjective:    Patient ID: Matthew Patrick, male    DOB: 17-Dec-1945, 69 y.o.   MRN: [id number]  HPI   PROB LIST  - obstructive lung disease nos with low dlco (paint fumes + remote smoking)  - never had subjective improvementt iwht MDI - chronic multifactorial cough  - sinus  - gerd: PPI  0- irritable larynx - started gabapentin early 2015/late 2014 with good rsult, s/P: speech Rx - Lung nodule with hx of remote smoking  -  6mm nodule RUL - no change dec 2014 through Jan 2016 - no fu for this . OSA with restless legs  - sees Victoria. STarted cpap 2015 mid  - resltess legs improved with gabapentin for cough  - smoking hx  -  reports that he quit smoking about 47 years ago. His smoking use included Cigarettes. He has a 5 pack-year smoking history. He does not have any smokeless tobacco history on file.    OV 05/22/2014  Chief Complaint  Patient presents with  . Follow-up    Pt stated his breathing is much improved since starting on CPAP. Pt denies cough, SOB and CP/tightness.     FU for all of above issues  1. Obstructive lung disease: Given lack of subjective improvement with maintenance bronchodilators and steroids. He has stopped using this completely. He only uses albuterol as needed. Every few weeks he does have some chest tightness for which she uses albuterol as rescue. He does not feel the need to take any further inhalers on a scheduled basis. Since I last saw him in March 2015 he has had a diagnosis of sleep apnea made and he is on CPAP which she feels better significant impact in his symptoms and he attributes most of the improvement due to diagnosis and management of sleep apnea. Spirometry today shows continued obstruction at moderate severity he did FEV1 2.3 L/65%, FVC 3.3 L/73%, ratio 69 (  Similar to dec 2014). Of note, did have a cold and took him long time to recover per wife  2. Lung nodule 4 mm right upper lobe: 1 you CT scan of the chest done January 2060 shows this is  stable but patient has new  5 mm pulmonary nodule in the inferior aspect of the lingula - Jan  2016    3. Chronic cough: This continues to be in remission with the help of gabapentin and Flonase and acid reflux treatment. He has now been on gabapentin for for at least a year. We discussed stopping this gradually which he is open to but his wife does not want him to stop his gabapentin ago she worries that his restless leg will come back and rt he will start kicking her in  bed  Smoking:  reports that he quit smoking about 47 years ago. His smoking use included Cigarettes. He has a 5 pack-year smoking history. He does not have any smokeless tobacco history on file.    OV 01/19/2015  Chief Complaint  Patient presents with  . Follow-up    Had CT scan done 2 wks. ago.Feeling better with coughing since on CPAP.Doing well with CPAP.Occass Sob when lying down,nasal congestion when lays down.PND,no sorethroat.    Follow-up  # Obstructive lung disease/like a COPD given exposure history: Overall stable. He is on Spiriva daily. He and his wife [name] that ever since this started using CPAP for sleep apnea overall quality of life, dyspnea, energy levels and restless legs all have improved. Today spirometry shows  FEV1 70%. There are no new issues no emergency room visits no exacerbations  #Cough this is no longer an issue but he still takes the gabapentin for his restless legs which is helping. He is taking 300 mg once daily at night for 2 years almost. Wife does not want him to stop this because they feel restless legs will be worse and he will start kicking her.  #Sleep apnea: This is associated with restless legs. He has not seen Dr. Elsworth Soho in over a year. He is taking gabapentin for restless legs. Wonder if he could go down on gabapentin  #Lung nodule  - Personally visualized the CT image. It is below. He is upset that I did not call him with the CT result from a few weeks ago even though today's  visit is to review these results. In the future he wants me to call him about the CT results even before the visit   MPRESSION: 1. Lingular 5 mm pulmonary nodule, stable for 7 months. A final follow-up chest CT is advised in 12 months. This recommendation follows the consensus statement: Guidelines for Management of Small Pulmonary Nodules Detected on CT Scans: A Statement from the [employer] as published in Radiology 2005;237:395-400. 2. Five additional scattered pulmonary nodules, largest 5 mm in the left lower lobe, for which 20 month stability has been demonstrated, in keeping with benign nodules for which no further follow-up is required. 3. Stable subsegmental bibasilar lung scarring.   Electronically Signed  By: Ilona Sorrel M.D.  On: 12/31/2014 13:52   Current outpatient prescriptions:  .  albuterol (PROVENTIL HFA;VENTOLIN HFA) 108 (90 BASE) MCG/ACT inhaler, Inhale 2 puffs into the lungs every 6 (six) hours as needed for wheezing or shortness of breath., Disp: 1 Inhaler, Rfl: 5 .  ARIPiprazole (ABILIFY) 2 MG tablet, Take 2 mg by mouth daily., Disp: , Rfl:  .  aspirin 81 MG tablet, Take 81 mg by mouth daily., Disp: , Rfl:  .  DULoxetine (CYMBALTA) 60 MG capsule, Take 60 mg by mouth daily., Disp: , Rfl:  .  fluticasone (FLONASE) 50 MCG/ACT nasal spray, Place 2 sprays into both nostrils daily., Disp: , Rfl:  .  gabapentin (NEURONTIN) 300 MG capsule, TAKE ONE CAPSULE BY MOUTH TWICE A DAY, Disp: 60 capsule, Rfl: 1 .  ibuprofen (ADVIL,MOTRIN) 600 MG tablet, Take 600 mg by mouth every 6 (six) hours as needed. For headache , Disp: , Rfl:  .  LORazepam (ATIVAN) 1 MG tablet, Take 1 mg by mouth 2 (two) times daily. , Disp: , Rfl:  .  mirtazapine (REMERON) 15 MG tablet, Take 15 mg by mouth at bedtime. , Disp: , Rfl:  .  phenylephrine (SUDAFED PE) 10 MG TABS tablet, Take 10 mg by mouth every 4 (four) hours as needed., Disp: , Rfl:  .  tiotropium (SPIRIVA) 18 MCG inhalation  capsule, Place 1 capsule (18 mcg total) into inhaler and inhale daily., Disp: 30 capsule, Rfl: 6  Immunization History  Administered Date(s) Administered  . Influenza Split 02/22/2013, 01/22/2014  . Pneumococcal Polysaccharide-23 04/24/2010     Review of Systems     per history of present illness  Objective:   Physical Exam  Constitutional: He is oriented to person, place, and time. He appears well-developed and well-nourished. No distress.  HENT:  Head: Normocephalic and atraumatic.  Right Ear: External ear normal.  Left Ear: External ear normal.  Mouth/Throat: Oropharynx is clear and moist. No oropharyngeal exudate.  Eyes: Conjunctivae and EOM  are normal. Pupils are equal, round, and reactive to light. Right eye exhibits no discharge. Left eye exhibits no discharge. No scleral icterus.  Neck: Normal range of motion. Neck supple. No JVD present. No tracheal deviation present. No thyromegaly present.  Cardiovascular: Normal rate, regular rhythm and intact distal pulses.  Exam reveals no gallop and no friction rub.   No murmur heard. Pulmonary/Chest: Effort normal and breath sounds normal. No respiratory distress. He has no wheezes. He has no rales. He exhibits no tenderness.  Abdominal: Soft. Bowel sounds are normal. He exhibits no distension and no mass. There is no tenderness. There is no rebound and no guarding.  Musculoskeletal: Normal range of motion. He exhibits no edema or tenderness.  Lymphadenopathy:    He has no cervical adenopathy.  Neurological: He is alert and oriented to person, place, and time. He has normal reflexes. No cranial nerve deficit. Coordination normal.  Skin: Skin is warm and dry. No rash noted. He is not diaphoretic. No erythema. No pallor.  Psychiatric: He has a normal mood and affect. His behavior is normal. Judgment and thought content normal.  Nursing note and vitals reviewed.   Filed Vitals:   01/19/15 1020  BP: 158/64  Pulse: 87  Temp: 98.1 F  (36.7 C)  TempSrc: Oral  Height: 5\' 10"  (1.778 m)  Weight: 172 lb (78.019 kg)  SpO2: 94%          Assessment & Plan:     ICD-9-CM ICD-10-CM   1. Lung nodule < 6cm on CT 793.11 R91.1 CT Chest Wo Contrast  2. Cough 786.2 R05 Spirometry with graph     CT Chest Wo Contrast  3. OSA (obstructive sleep apnea) 327.23 G47.33 CT Chest Wo Contrast  4. Restless leg 333.94 G25.81 CT Chest Wo Contrast  5. Chronic obstructive pulmonary disease, unspecified COPD, unspecified chronic bronchitis type 496 J44.9 CT Chest Wo Contrast    #obst lung disease  - Lung function is stable/slightly improved - Continue Spiriva daily - Flu shot today 01/19/2015  #Cough From the standpoint he did not need gabapentin anymore because this is resolved  #Obstructive sleep apnea with restless leg - Cut down her gabapentin to 200 mg once daily at night; take new prescription from office assistant - Recommend he make an appointment with Dr. Elsworth Soho - Continue CPAP  #Lung nodule(s)  - Repeat CT scan of the chest without contrast in 1 year  #Follow-up - CT scan of the chest in 1 year - Follow-up to see me in one year   Dr. Brand Males, M.D., [facility].C.P Pulmonary and Critical Care Medicine Staff Physician [place] [facility] Pager: [phone], If no answer or between  15:00h - 7:00h: call [phone]  01/19/2015 11:00 AM

## 2015-01-19 NOTE — Addendum Note (Signed)
Addended by: Parke Poisson E on: 01/19/2015 11:14 AM   Modules accepted: Orders

## 2015-05-23 ENCOUNTER — Other Ambulatory Visit: Payer: Self-pay | Admitting: Internal Medicine

## 2015-06-21 ENCOUNTER — Encounter: Payer: Self-pay | Admitting: Pulmonary Disease

## 2015-06-21 ENCOUNTER — Ambulatory Visit (INDEPENDENT_AMBULATORY_CARE_PROVIDER_SITE_OTHER): Payer: Medicare Other | Admitting: Pulmonary Disease

## 2015-06-21 VITALS — BP 144/82 | HR 76 | Ht 70.0 in | Wt 177.0 lb

## 2015-06-21 DIAGNOSIS — G4733 Obstructive sleep apnea (adult) (pediatric): Secondary | ICD-10-CM | POA: Diagnosis not present

## 2015-06-21 DIAGNOSIS — J449 Chronic obstructive pulmonary disease, unspecified: Secondary | ICD-10-CM | POA: Diagnosis not present

## 2015-06-21 NOTE — Assessment & Plan Note (Signed)
Ct spiriva  

## 2015-06-21 NOTE — Patient Instructions (Signed)
You are on autoCPAP settings 10-15 CPAP supplies will be renewed x  1year

## 2015-06-21 NOTE — Addendum Note (Signed)
Addended by: Mathis Dad on: 06/21/2015 04:58 PM   Modules accepted: Orders

## 2015-06-21 NOTE — Progress Notes (Signed)
   Subjective:    Patient ID: Matthew Patrick, male    DOB: May 19, 1945, 70 y.o.   MRN: [id number]  HPI  70 year old for FU of obstructive sleep apnea.   11/2011 PSG >>wt 157 pounds.Total sleep time was 379 minutes, with 33% supine sleep and 19% REM sleep. AHI was 4.1 per hour with RDI of 8.9 events per hour, supine AHI was 12 events per hour with the lowest desaturation of 79%. PLM index was 59 events per hour with PLM arousal index of 8.5 events per hour.   He takes Cymbalta and Ativan twice daily for anxiety and seroquel at bedtime.   Nocturnal oximetry in 03/2013 showed desaturations for about 3 hours the lowest of about 77%.  HST showed OSA -stopped breathing 10/h  CPAP 12 cm, med FF mask, PLMs persisted   1 mnth download >> on 12 cm, leak ++, AHI 18/h, excellent usage   06/21/2015  Chief Complaint  Patient presents with  . Follow-up    pt. states he wears CPAP 8 hr. everynight. pressure is good. supplies needed (hose, mask, tubing & nose pillows) DME:[facility]   Annual FU His leg movements have improved substantially with Neurontin. Neds new supplies  Mask ok, pr ok, no dryness Continues to have leak with FF mask - ? beard   Download 1 month  05/2015 >> no reisduals, leak ++, on auto 10-15, avg 11 cm  Review of Systems neg for any significant sore throat, dysphagia, itching, sneezing, nasal congestion or excess/ purulent secretions, fever, chills, sweats, unintended wt loss, pleuritic or exertional cp, hempoptysis, orthopnea pnd or change in chronic leg swelling. Also denies presyncope, palpitations, heartburn, abdominal pain, nausea, vomiting, diarrhea or change in bowel or urinary habits, dysuria,hematuria, rash, arthralgias, visual complaints, headache, numbness weakness or ataxia.     Objective:   Physical Exam  Gen. Pleasant, well-nourished, in no distress ENT - no lesions, no post nasal drip Neck: No JVD, no thyromegaly, no carotid bruits Lungs: no use of accessory muscles, no  dullness to percussion, clear without rales or rhonchi  Cardiovascular: Rhythm regular, heart sounds  normal, no murmurs or gallops, no peripheral edema Musculoskeletal: No deformities, no cyanosis or clubbing        Assessment & Plan:

## 2015-06-21 NOTE — Assessment & Plan Note (Signed)
Ct auto 10-15 Renew supplies x 1 yr  Weight loss encouraged, compliance with goal of at least 4-6 hrs every night is the expectation. Advised against medications with sedative side effects Cautioned against driving when sleepy - understanding that sleepiness will vary on a day to day basis

## 2015-06-22 DIAGNOSIS — M5136 Other intervertebral disc degeneration, lumbar region: Secondary | ICD-10-CM | POA: Diagnosis not present

## 2015-06-22 DIAGNOSIS — M5416 Radiculopathy, lumbar region: Secondary | ICD-10-CM | POA: Diagnosis not present

## 2015-06-22 DIAGNOSIS — M4726 Other spondylosis with radiculopathy, lumbar region: Secondary | ICD-10-CM | POA: Diagnosis not present

## 2015-06-22 DIAGNOSIS — M4806 Spinal stenosis, lumbar region: Secondary | ICD-10-CM | POA: Diagnosis not present

## 2015-06-22 DIAGNOSIS — Z6825 Body mass index (BMI) 25.0-25.9, adult: Secondary | ICD-10-CM | POA: Diagnosis not present

## 2015-06-24 DIAGNOSIS — I10 Essential (primary) hypertension: Secondary | ICD-10-CM | POA: Diagnosis not present

## 2015-06-24 DIAGNOSIS — Z125 Encounter for screening for malignant neoplasm of prostate: Secondary | ICD-10-CM | POA: Diagnosis not present

## 2015-06-24 DIAGNOSIS — E784 Other hyperlipidemia: Secondary | ICD-10-CM | POA: Diagnosis not present

## 2015-07-01 DIAGNOSIS — K219 Gastro-esophageal reflux disease without esophagitis: Secondary | ICD-10-CM | POA: Diagnosis not present

## 2015-07-01 DIAGNOSIS — Z1389 Encounter for screening for other disorder: Secondary | ICD-10-CM | POA: Diagnosis not present

## 2015-07-01 DIAGNOSIS — G5601 Carpal tunnel syndrome, right upper limb: Secondary | ICD-10-CM | POA: Diagnosis not present

## 2015-07-01 DIAGNOSIS — J449 Chronic obstructive pulmonary disease, unspecified: Secondary | ICD-10-CM | POA: Diagnosis not present

## 2015-07-01 DIAGNOSIS — J302 Other seasonal allergic rhinitis: Secondary | ICD-10-CM | POA: Diagnosis not present

## 2015-07-01 DIAGNOSIS — Z0000 Encounter for general adult medical examination without abnormal findings: Secondary | ICD-10-CM | POA: Diagnosis not present

## 2015-07-01 DIAGNOSIS — M4806 Spinal stenosis, lumbar region: Secondary | ICD-10-CM | POA: Diagnosis not present

## 2015-07-01 DIAGNOSIS — Z6825 Body mass index (BMI) 25.0-25.9, adult: Secondary | ICD-10-CM | POA: Diagnosis not present

## 2015-07-01 DIAGNOSIS — G4733 Obstructive sleep apnea (adult) (pediatric): Secondary | ICD-10-CM | POA: Diagnosis not present

## 2015-07-01 DIAGNOSIS — K589 Irritable bowel syndrome without diarrhea: Secondary | ICD-10-CM | POA: Diagnosis not present

## 2015-07-01 DIAGNOSIS — G2581 Restless legs syndrome: Secondary | ICD-10-CM | POA: Diagnosis not present

## 2015-07-01 DIAGNOSIS — E784 Other hyperlipidemia: Secondary | ICD-10-CM | POA: Diagnosis not present

## 2015-07-14 ENCOUNTER — Encounter: Payer: Self-pay | Admitting: Pulmonary Disease

## 2015-07-16 ENCOUNTER — Telehealth: Payer: Self-pay | Admitting: Pulmonary Disease

## 2015-07-16 NOTE — Telephone Encounter (Signed)
Per pt's chart, order was indeed sent to [facility] at the last visit on 2.27.17 Called [facility] - they rolled their phones at [facility] spoke with pt, let him know of the above and he voiced his understanding We will call [facility] again on Monday

## 2015-07-20 NOTE — Telephone Encounter (Signed)
Called [facility] and spoke with Jeani Hawking.  She said that her Resp team has tried to contact patient, but has been unsuccessful.  Confirmed correct phone number for patient. She said that they will try to call him back.  Nothing further needed.

## 2015-07-24 ENCOUNTER — Other Ambulatory Visit: Payer: Self-pay | Admitting: Internal Medicine

## 2015-08-02 ENCOUNTER — Other Ambulatory Visit: Payer: Self-pay | Admitting: Internal Medicine

## 2015-08-02 DIAGNOSIS — R05 Cough: Secondary | ICD-10-CM | POA: Diagnosis not present

## 2015-08-02 DIAGNOSIS — K219 Gastro-esophageal reflux disease without esophagitis: Secondary | ICD-10-CM | POA: Diagnosis not present

## 2015-08-02 DIAGNOSIS — F329 Major depressive disorder, single episode, unspecified: Secondary | ICD-10-CM | POA: Diagnosis not present

## 2015-08-02 DIAGNOSIS — Z6825 Body mass index (BMI) 25.0-25.9, adult: Secondary | ICD-10-CM | POA: Diagnosis not present

## 2015-08-02 DIAGNOSIS — F419 Anxiety disorder, unspecified: Secondary | ICD-10-CM | POA: Diagnosis not present

## 2015-08-02 DIAGNOSIS — J449 Chronic obstructive pulmonary disease, unspecified: Secondary | ICD-10-CM | POA: Diagnosis not present

## 2015-08-12 ENCOUNTER — Telehealth: Payer: Self-pay | Admitting: Pulmonary Disease

## 2015-08-12 NOTE — Telephone Encounter (Signed)
Called spoke with pt. He states that he has contacted [facility] several times and has been unable to get any CPAP supplies since his visit on 06/21/15. I explained to him that I would contact [facility] to discuss the situation and would return his call once it was resolved. He voiced understanding and had no further questions.  Called spoke with [place] at [facility]. She states that she sees the order that was placed on 06/21/15 for the renewal of CPAP supplies x 1 year and is unsure why he has not received his supplies. She states she will send a message to the local store and have someone return our call. Will await call back.

## 2015-08-13 NOTE — Telephone Encounter (Signed)
Matthew Patrick - have you received a CMN for this patient? Please advise.

## 2015-08-13 NOTE — Telephone Encounter (Signed)
I just called and spoke with Jeani Hawking at [facility] and she stated they have not sent out a CMN for this patient I explained to her that the patient has not gotten any CPAP supplies since the order was places 06/15/2015. She asked me to call the patient and ask him to give her a call. As soon as I hung up the phone I called the patient and explained what Jeani Hawking told me and gave him her number. I also ask if he didn't get supplies this time to please call back and we could go over her head to get his supplies

## 2015-08-16 ENCOUNTER — Ambulatory Visit (INDEPENDENT_AMBULATORY_CARE_PROVIDER_SITE_OTHER): Payer: Medicare Other | Admitting: Licensed Clinical Social Worker

## 2015-08-16 DIAGNOSIS — F418 Other specified anxiety disorders: Secondary | ICD-10-CM

## 2015-09-07 ENCOUNTER — Ambulatory Visit (INDEPENDENT_AMBULATORY_CARE_PROVIDER_SITE_OTHER): Payer: Medicare Other | Admitting: Licensed Clinical Social Worker

## 2015-09-07 DIAGNOSIS — F418 Other specified anxiety disorders: Secondary | ICD-10-CM | POA: Diagnosis not present

## 2015-09-12 DIAGNOSIS — F341 Dysthymic disorder: Secondary | ICD-10-CM | POA: Diagnosis not present

## 2015-09-14 ENCOUNTER — Ambulatory Visit (HOSPITAL_COMMUNITY)
Admission: RE | Admit: 2015-09-14 | Discharge: 2015-09-14 | Disposition: A | Discharge status: Home / Self Care | Payer: Medicare Other | Attending: Psychiatry | Admitting: Psychiatry

## 2015-09-14 NOTE — BH Assessment (Signed)
Assessment Note  Matthew Patrick is an 70 y.o. male, Caucasian, Married who presents to Matthew Patrick as a walk-in with wife present during assessment. Patient primary concern is depression and anxiety. Patient states that he has been having worsening symptoms of depression and anxiety with inability to enjoy or pursue leisure activities. Per wife, she is concerned with pt. Due to recent change of routine/ patterns which pt. States was due to anxiety increase and depression/ restlessness. Patient states he has been sleeping up to 6 hours per night. patient was recently prescribed Seroquel and Lamictal by psychiatrist and has been taking it for less than x 1 week. Patient is a [place] Veteran who resides with his wife.   Patient denies current or past hx. Of SI/HI. patient denies current or past hx. Of AVH. Patient denies being seen for inpatient psychiatric treatment. Patient is currently seen Patrick via psychiatrist and therapist for medication management and bipolar depression with anxiety. Patent denies current or past hx. Of substance abuse.  Patient is dressed in layered clothing,  and is alert and oriented x4. Patient speech was within normal limits and motor behavior appeared normal. Patient thought process is coherent. Patient does not appear to be responding to internal stimuli. Patient was cooperative throughout the assessment and states that he is not agreeable to inpatient psychiatric treatment.    Diagnosis: 296.53 [F31.4] Bipolar I Disorder, most recent episode depressed , severe  Past Medical History:  Past Medical History  Diagnosis Date  . Hypertension   . Hyperlipemia   . Anxiety   . Asthma     Past Surgical History  Procedure Laterality Date  . Neck skin tag biopsy    . 2d echocardiogram  03/27/11  . Tonsillectomy and adenoidectomy    . Cataract extraction      Family History:  Family History  Problem Relation Age of Onset  . Emphysema Mother   . Allergies Mother   . Heart  disease Father     Social History:  reports that he quit smoking about 48 years ago. His smoking use included Cigarettes. He has a 5 pack-year smoking history. He does not have any smokeless tobacco history on file. He reports that he drinks about 4.2 oz of alcohol per week. He reports that he does not use illicit drugs.  Additional Social History:  Alcohol / Drug Use Pain Medications: Matthew Patrick Prescriptions: Matthew Patrick Over the Counter: Matthew Patrick History of alcohol / drug use?: No history of alcohol / drug abuse Longest period of sobriety (when/how long): Matthew Patrick  CIWA:   COWS:    Allergies: No Known Allergies  Home Medications:  (Not in a Patrick admission)  OB/GYN Status:  No LMP for male patient.  General Assessment Data Location of Assessment: Matthew Patrick Assessment Services TTS Assessment: In system Is this a Tele or Face-to-Face Assessment?: Face-to-Face Is this an Initial Assessment or a Re-assessment for this encounter?: Initial Assessment Marital status: Married Matthew Patrick name:  (Matthew Patrick) Is patient pregnant?: No Pregnancy Status: No Living Arrangements: Spouse/significant other Can pt return to current living arrangement?: Yes Admission Status: Voluntary Is patient capable of signing voluntary admission?: Yes Referral Source: Self/Family/Friend Insurance type:  (Medicare)  Medical Screening Exam (Matthew Patrick) Medical Exam completed: No Reason for MSE not completed:  (pt refused)  Crisis Patrick Plan Living Arrangements: Spouse/significant other Name of Psychiatrist:  (Matthew Patrick) Name of Therapist:  Caprice Kluver [facility])  Education Status Is patient currently in school?: No Current Grade: Matthew Patrick Highest  grade of school patient has completed: Matthew Patrick Name of school: Matthew Patrick Contact person:  Matthew Patrick)  Risk to self with the past 6 months Suicidal Ideation: No Suicidal Intent: No Is patient at risk for suicide?: No (pt verbal contract for safety, wife feels safe with pt.  home) Suicidal Plan?: No Has patient had any suicidal plan within the past 6 months prior to admission? : No Access to Means: No What has been your use of drugs/alcohol within the last 12 months?: Matthew Patrick Previous Attempts/Gestures: No How many times?: 0 Other Self Harm Risks: n Triggers for Past Attempts: None known Intentional Self Injurious Behavior: None Family Suicide History: No Recent stressful life event(s):  (loss of interest in leisure activities) Persecutory voices/beliefs?: No Depression: Yes Depression Symptoms: Despondent, Insomnia, Tearfulness, Isolating, Fatigue, Guilt, Loss of interest in usual pleasures, Feeling worthless/self pity Substance abuse history and/or treatment for substance abuse?: No Suicide prevention information given to non-admitted patients: Not applicable  Risk to Others within the past 6 months Homicidal Ideation: No Does patient have any lifetime risk of violence toward others beyond the six months prior to admission? : No Thoughts of Harm to Others: No Current Homicidal Intent: No Current Homicidal Plan: No Access to Homicidal Means: No Identified Victim: Matthew Patrick History of harm to others?: No Assessment of Violence: None Noted Violent Behavior Description: Matthew Patrick Does patient have access to weapons?: No Criminal Charges Pending?: No Does patient have a court date: No Is patient on probation?: No  Psychosis Hallucinations: None noted Delusions: None noted  Mental Status Report Appearance/Hygiene: Layered clothes Eye Contact: Poor Motor Activity: Unremarkable Speech: Soft, Slow Level of Consciousness: Alert Mood: Depressed Affect: Apprehensive, Depressed, Frightened Anxiety Level: Moderate Thought Processes: Flight of Ideas Judgement: Partial Orientation: Person, Place, Time, Situation, Appropriate for developmental age Obsessive Compulsive Thoughts/Behaviors: Severe  Cognitive Functioning Concentration: Decreased Memory: Recent Intact,  Remote Intact IQ: Average Insight: Fair Impulse Control: Poor Appetite: Fair Weight Loss: 10 Weight Gain: 0 Sleep: Decreased Total Hours of Sleep: 5 Vegetative Symptoms: None  ADLScreening [facility] Assessment Services) Patient's cognitive ability adequate to safely complete daily activities?: Yes Patient able to express need for assistance with ADLs?: Yes Independently performs ADLs?: Yes (appropriate for developmental age)  Prior Inpatient Therapy Prior Inpatient Therapy: No Prior Therapy Dates: Matthew Patrick Prior Therapy Facilty/Provider(s): Matthew Patrick Reason for Treatment: Matthew Patrick  Prior Patrick Therapy Prior Patrick Therapy: Yes Prior Therapy Dates: current Prior Therapy Facilty/Provider(s): [facility]lu[facility] Reason for Treatment: depression, anxiety Does patient have an ACCT team?: No Does patient have Intensive In-House Services?  : No Does patient have [facility] services? : No Does patient have P4CC services?: No  ADL Screening (condition at time of admission) Patient's cognitive ability adequate to safely complete daily activities?: Yes Is the patient deaf or have difficulty hearing?: No Does the patient have difficulty seeing, even when wearing glasses/contacts?: No Does the patient have difficulty concentrating, remembering, or making decisions?: No Patient able to express need for assistance with ADLs?: Yes Does the patient have difficulty dressing or bathing?: No Independently performs ADLs?: Yes (appropriate for developmental age) Does the patient have difficulty walking or climbing stairs?: No Weakness of Legs: None Weakness of Arms/Hands: None  Home Assistive Devices/Equipment Home Assistive Devices/Equipment: None    Abuse/Neglect Assessment (Assessment to be complete while patient is alone) Physical Abuse: Denies Verbal Abuse: Denies Sexual Abuse: Denies Exploitation of patient/patient's resources: Denies Self-Neglect: Denies Values / Beliefs Cultural Requests  During Hospitalization: None Spiritual Requests During Hospitalization: None  Advance Directives (For Healthcare) Does patient have an advance directive?: No Would patient like information on creating an advanced directive?: No - patient declined information    Additional Information 1:1 In Past 12 Months?: No CIRT Risk: No Does patient have medical clearance?: No (pt not wish to be seen for medical)     Disposition: Per Ludger Nutting, NP does not meet criteria for inpatient.   Recommended follow up with current providers, follow up with intensive Patrick program.  Disposition Initial Assessment Completed for this Encounter: Yes Disposition of Patient: Patrick treatment Type of Patrick treatment: Adult, Psych Intensive Patrick (recommended, pt. acknowledges will try intensive Patrick.)  On Site Evaluation by:   Reviewed with Physician:    Kristeen Mans 09/14/2015 2:37 PM

## 2015-09-17 ENCOUNTER — Encounter: Payer: Self-pay | Admitting: Emergency Medicine

## 2015-09-17 ENCOUNTER — Emergency Department (EMERGENCY_DEPARTMENT_HOSPITAL)
Admission: EM | Admit: 2015-09-17 | Discharge: 2015-09-17 | Disposition: A | Discharge status: Other Acute Inpatient Hospital | Payer: Medicare Other | Source: Home / Self Care | Attending: Emergency Medicine | Admitting: Emergency Medicine

## 2015-09-17 ENCOUNTER — Inpatient Hospital Stay
Admission: RE | Admit: 2015-09-17 | Discharge: 2015-09-20 | DRG: 885 | Disposition: A | Discharge status: Home / Self Care | Payer: Medicare Other | Source: Intra-hospital | Attending: Internal Medicine | Admitting: Internal Medicine

## 2015-09-17 ENCOUNTER — Emergency Department: Payer: Medicare Other

## 2015-09-17 ENCOUNTER — Ambulatory Visit (INDEPENDENT_AMBULATORY_CARE_PROVIDER_SITE_OTHER): Payer: Medicare Other | Admitting: Licensed Clinical Social Worker

## 2015-09-17 DIAGNOSIS — J449 Chronic obstructive pulmonary disease, unspecified: Secondary | ICD-10-CM

## 2015-09-17 DIAGNOSIS — Z87891 Personal history of nicotine dependence: Secondary | ICD-10-CM

## 2015-09-17 DIAGNOSIS — R200 Anesthesia of skin: Secondary | ICD-10-CM

## 2015-09-17 DIAGNOSIS — Z01810 Encounter for preprocedural cardiovascular examination: Secondary | ICD-10-CM | POA: Diagnosis not present

## 2015-09-17 DIAGNOSIS — G4733 Obstructive sleep apnea (adult) (pediatric): Secondary | ICD-10-CM | POA: Diagnosis present

## 2015-09-17 DIAGNOSIS — F32A Depression, unspecified: Secondary | ICD-10-CM

## 2015-09-17 DIAGNOSIS — F333 Major depressive disorder, recurrent, severe with psychotic symptoms: Secondary | ICD-10-CM | POA: Diagnosis not present

## 2015-09-17 DIAGNOSIS — E871 Hypo-osmolality and hyponatremia: Secondary | ICD-10-CM | POA: Diagnosis present

## 2015-09-17 DIAGNOSIS — Z825 Family history of asthma and other chronic lower respiratory diseases: Secondary | ICD-10-CM

## 2015-09-17 DIAGNOSIS — Z7982 Long term (current) use of aspirin: Secondary | ICD-10-CM

## 2015-09-17 DIAGNOSIS — R45851 Suicidal ideations: Secondary | ICD-10-CM

## 2015-09-17 DIAGNOSIS — E785 Hyperlipidemia, unspecified: Secondary | ICD-10-CM | POA: Diagnosis present

## 2015-09-17 DIAGNOSIS — F419 Anxiety disorder, unspecified: Secondary | ICD-10-CM | POA: Diagnosis present

## 2015-09-17 DIAGNOSIS — E222 Syndrome of inappropriate secretion of antidiuretic hormone: Secondary | ICD-10-CM | POA: Diagnosis not present

## 2015-09-17 DIAGNOSIS — I10 Essential (primary) hypertension: Secondary | ICD-10-CM

## 2015-09-17 DIAGNOSIS — F339 Major depressive disorder, recurrent, unspecified: Secondary | ICD-10-CM | POA: Diagnosis not present

## 2015-09-17 DIAGNOSIS — F332 Major depressive disorder, recurrent severe without psychotic features: Secondary | ICD-10-CM

## 2015-09-17 DIAGNOSIS — J45909 Unspecified asthma, uncomplicated: Secondary | ICD-10-CM | POA: Diagnosis present

## 2015-09-17 DIAGNOSIS — F329 Major depressive disorder, single episode, unspecified: Secondary | ICD-10-CM

## 2015-09-17 DIAGNOSIS — Z79899 Other long term (current) drug therapy: Secondary | ICD-10-CM

## 2015-09-17 DIAGNOSIS — F411 Generalized anxiety disorder: Secondary | ICD-10-CM | POA: Diagnosis not present

## 2015-09-17 DIAGNOSIS — Z8249 Family history of ischemic heart disease and other diseases of the circulatory system: Secondary | ICD-10-CM | POA: Diagnosis not present

## 2015-09-17 DIAGNOSIS — J90 Pleural effusion, not elsewhere classified: Secondary | ICD-10-CM | POA: Diagnosis not present

## 2015-09-17 DIAGNOSIS — R42 Dizziness and giddiness: Secondary | ICD-10-CM | POA: Diagnosis not present

## 2015-09-17 DIAGNOSIS — K5900 Constipation, unspecified: Secondary | ICD-10-CM | POA: Diagnosis present

## 2015-09-17 DIAGNOSIS — D649 Anemia, unspecified: Secondary | ICD-10-CM | POA: Diagnosis not present

## 2015-09-17 LAB — CBC
HEMATOCRIT: 38.5 % — AB (ref 40.0–52.0)
HEMOGLOBIN: 13.7 g/dL (ref 13.0–18.0)
MCH: 32.1 pg (ref 26.0–34.0)
MCHC: 35.7 g/dL (ref 32.0–36.0)
MCV: 90 fL (ref 80.0–100.0)
Platelets: 264 10*3/uL (ref 150–440)
RBC: 4.28 MIL/uL — AB (ref 4.40–5.90)
RDW: 11.8 % (ref 11.5–14.5)
WBC: 10.4 10*3/uL (ref 3.8–10.6)

## 2015-09-17 LAB — COMPREHENSIVE METABOLIC PANEL
ALBUMIN: 4.3 g/dL (ref 3.5–5.0)
ALK PHOS: 71 U/L (ref 38–126)
ALT: 25 U/L (ref 17–63)
AST: 26 U/L (ref 15–41)
Anion gap: 8 (ref 5–15)
BUN: 9 mg/dL (ref 6–20)
CHLORIDE: 93 mmol/L — AB (ref 101–111)
CO2: 25 mmol/L (ref 22–32)
CREATININE: 0.92 mg/dL (ref 0.61–1.24)
Calcium: 9.4 mg/dL (ref 8.9–10.3)
GFR calc non Af Amer: >60 mL/min (ref >60)
GLUCOSE: 101 mg/dL — AB (ref 65–99)
Potassium: 4.1 mmol/L (ref 3.5–5.1)
SODIUM: 126 mmol/L — AB (ref 135–145)
Total Bilirubin: 0.5 mg/dL (ref 0.3–1.2)
Total Protein: 6.9 g/dL (ref 6.5–8.1)

## 2015-09-17 LAB — URINE DRUG SCREEN, QUALITATIVE (ARMC ONLY)
Amphetamines, Ur Screen: NOT DETECTED
Barbiturates, Ur Screen: NOT DETECTED
Benzodiazepine, Ur Scrn: POSITIVE — AB
COCAINE METABOLITE, UR ~~LOC~~: NOT DETECTED
Cannabinoid 50 Ng, Ur ~~LOC~~: NOT DETECTED
MDMA (ECSTASY) UR SCREEN: NOT DETECTED
METHADONE SCREEN, URINE: NOT DETECTED
OPIATE, UR SCREEN: NOT DETECTED
Phencyclidine (PCP) Ur S: NOT DETECTED
Tricyclic, Ur Screen: NOT DETECTED

## 2015-09-17 LAB — SALICYLATE LEVEL

## 2015-09-17 LAB — ACETAMINOPHEN LEVEL

## 2015-09-17 LAB — ETHANOL: Alcohol, Ethyl (B): <5 mg/dL (ref <5)

## 2015-09-17 MED ORDER — LORAZEPAM 0.5 MG PO TABS
0.5000 mg | ORAL_TABLET | Freq: Four times a day (QID) | ORAL | Status: DC
  Administered 2015-09-18 – 2015-09-20 (×9): 0.5 mg via ORAL
  Filled 2015-09-17 (×9): qty 1

## 2015-09-17 MED ORDER — LORAZEPAM 0.5 MG PO TABS
ORAL_TABLET | ORAL | Status: AC
  Filled 2015-09-17: qty 1

## 2015-09-17 MED ORDER — TIOTROPIUM BROMIDE MONOHYDRATE 18 MCG IN CAPS
18.0000 ug | ORAL_CAPSULE | Freq: Every day | RESPIRATORY_TRACT | Status: DC
  Administered 2015-09-19 – 2015-09-20 (×2): 18 ug via RESPIRATORY_TRACT
  Filled 2015-09-17: qty 5

## 2015-09-17 MED ORDER — ALBUTEROL SULFATE HFA 108 (90 BASE) MCG/ACT IN AERS
2.0000 | INHALATION_SPRAY | RESPIRATORY_TRACT | Status: DC | PRN
  Filled 2015-09-17: qty 6.7

## 2015-09-17 MED ORDER — LORAZEPAM 0.5 MG PO TABS
0.5000 mg | ORAL_TABLET | Freq: Four times a day (QID) | ORAL | Status: DC
  Administered 2015-09-17: 0.5 mg via ORAL

## 2015-09-17 MED ORDER — FLUVOXAMINE MALEATE 50 MG PO TABS
50.0000 mg | ORAL_TABLET | Freq: Two times a day (BID) | ORAL | Status: DC
  Administered 2015-09-18 – 2015-09-20 (×5): 50 mg via ORAL
  Filled 2015-09-17 (×5): qty 1

## 2015-09-17 MED ORDER — TIOTROPIUM BROMIDE MONOHYDRATE 18 MCG IN CAPS
18.0000 ug | ORAL_CAPSULE | Freq: Every day | RESPIRATORY_TRACT | Status: DC
  Filled 2015-09-17: qty 5

## 2015-09-17 MED ORDER — QUETIAPINE FUMARATE 25 MG PO TABS: 100.0000 mg | ORAL_TABLET | Freq: Every day | ORAL | Status: DC

## 2015-09-17 MED ORDER — ALUM & MAG HYDROXIDE-SIMETH 200-200-20 MG/5ML PO SUSP: 30.0000 mL | ORAL | Status: DC | PRN

## 2015-09-17 MED ORDER — QUETIAPINE FUMARATE 100 MG PO TABS: 100.0000 mg | ORAL_TABLET | Freq: Every day | ORAL | Status: DC

## 2015-09-17 MED ORDER — ACETAMINOPHEN 325 MG PO TABS: 650.0000 mg | ORAL_TABLET | Freq: Four times a day (QID) | ORAL | Status: DC | PRN

## 2015-09-17 MED ORDER — FLUVOXAMINE MALEATE 50 MG PO TABS
50.0000 mg | ORAL_TABLET | Freq: Two times a day (BID) | ORAL | Status: DC
  Filled 2015-09-17: qty 1

## 2015-09-17 MED ORDER — MAGNESIUM HYDROXIDE 400 MG/5ML PO SUSP
30.0000 mL | Freq: Every day | ORAL | Status: DC | PRN
  Administered 2015-09-18 – 2015-09-19 (×5): 30 mL via ORAL
  Filled 2015-09-17 (×4): qty 30

## 2015-09-17 MED ORDER — ALBUTEROL SULFATE (2.5 MG/3ML) 0.083% IN NEBU: 3.0000 mL | INHALATION_SOLUTION | RESPIRATORY_TRACT | Status: DC | PRN

## 2015-09-17 NOTE — ED Notes (Signed)
Pt. Refused to eat snack tray because it was a [place] Sandwich. Pt. Verbalized "I don't like [place] sandwiches, what does it look like? I just ate not long ago".  I explained to the patient that this would be his only meal until breakfast and only water would be provided. Pt verbalized "Thats fine, Im just ready to go downstairs".

## 2015-09-17 NOTE — ED Provider Notes (Addendum)
[facility] Emergency Department Provider Note   ____________________________________________  Time seen: Approximately 3:20 PM  I have reviewed the triage vital signs and the nursing notes.   HISTORY  Chief Complaint Depression   HPI Matthew Patrick is a 70 y.o. male with a history of hypertension and anxiety who is presenting to the emergency department today because of increased depression. He was seen by his psychiatrist today [facility] and sent to the emergency department see Dr. Weber Cooks for possible ECT treatment. He says that he has had increased fatigue over the past several weeks and says that he just doesn't want to wake up in the mornings. He says that he has not attempted to kill himself over the past and has no specific plan. However, he says that if the opportunity presented itself male act on the impulse to kill himself. He says that he has particularly depressed because of the death of his mother this past 09-11-2022 as well as "a leak in his townhouse."   Past Medical History  Diagnosis Date  . Hypertension   . Hyperlipemia   . Anxiety   . Asthma     Patient Active Problem List   Diagnosis Date Noted  . Cough 01/19/2015  . Restless leg 01/19/2015  . COLD (chronic obstructive lung disease) ([place]) 05/22/2014  . Lung nodule < 6cm on CT 05/22/2014  . OSA (obstructive sleep apnea) 07/11/2013  . Lung nodule 05/11/2013  . Nocturnal hypoxemia 05/11/2013  . Obstructive lung disease ([place]) 04/17/2013  . Chronic cough 04/17/2013  . Syncope 03/25/2011  . Hypertension 03/25/2011  . Hypokalemia 03/25/2011  . Asthma 03/25/2011  . Sciatica 03/25/2011  . SOB (shortness of breath) 03/25/2011    Past Surgical History  Procedure Laterality Date  . Neck skin tag biopsy    . 2d echocardiogram  03/27/11  . Tonsillectomy and adenoidectomy    . Cataract extraction      Current Outpatient Rx  Name  Route  Sig  Dispense  Refill  . ARIPiprazole (ABILIFY)  2 MG tablet   Oral   Take 2 mg by mouth daily.         Marland Kitchen aspirin 81 MG tablet   Oral   Take 81 mg by mouth daily.         . DULoxetine (CYMBALTA) 60 MG capsule   Oral   Take 60 mg by mouth daily.         . fluticasone (FLONASE) 50 MCG/ACT nasal spray   Each Nare   Place 2 sprays into both nostrils daily.         Marland Kitchen gabapentin (NEURONTIN) 100 MG capsule      TAKE 2 CAPSULES AT BEDTIME   60 capsule   5   . ibuprofen (ADVIL,MOTRIN) 600 MG tablet   Oral   Take 600 mg by mouth every 6 (six) hours as needed. For headache          . LORazepam (ATIVAN) 1 MG tablet   Oral   Take 1 mg by mouth 2 (two) times daily.          . mirtazapine (REMERON) 15 MG tablet   Oral   Take 15 mg by mouth at bedtime.          . phenylephrine (SUDAFED PE) 10 MG TABS tablet   Oral   Take 10 mg by mouth every 4 (four) hours as needed.         Marland Kitchen PROAIR HFA 108 (90  Base) MCG/ACT inhaler      INHALE 2 PUFFS INTO THE LUNGS EVERY 6 HOURS AS NEEDED FOR WHEEZING OR SHORTNESS OF BREATH   8.5 Inhaler   3   . SPIRIVA HANDIHALER 18 MCG inhalation capsule      PLACE 1 CAPSULE (18 MCG TOTAL) INTO INHALER AND INHALE DAILY.   30 capsule   11     Allergies Review of patient's allergies indicates no known allergies.  Family History  Problem Relation Age of Onset  . Emphysema Mother   . Allergies Mother   . Heart disease Father     Social History Social History  Substance Use Topics  . Smoking status: Former Smoker -- 1.00 packs/day for 5 years    Types: Cigarettes    Quit date: 04/25/1967  . Smokeless tobacco: None  . Alcohol Use: 4.2 oz/week    7 Glasses of wine per week     Comment: 2-4 glasses of wine per night    Review of Systems Constitutional: No fever/chills Eyes: No visual changes. ENT: No sore throat. Cardiovascular: Denies chest pain. Respiratory: Denies shortness of breath. Gastrointestinal: No abdominal pain.  No nausea, no vomiting.  No diarrhea.     Genitourinary: Negative for dysuria. Musculoskeletal: Negative for back pain. Skin: Negative for rash. Neurological: Negative for headaches, focal weakness or numbness.  10-point ROS otherwise negative.  ____________________________________________   PHYSICAL EXAM:  VITAL SIGNS: ED Triage Vitals  Enc Vitals Group     BP 09/17/15 1424 115/66 mmHg     Pulse Rate 09/17/15 1424 77     Resp 09/17/15 1424 18     Temp 09/17/15 1424 98 F (36.7 C)     Temp Source 09/17/15 1424 Oral     SpO2 09/17/15 1424 96 %     Weight 09/17/15 1424 160 lb (72.576 kg)     Height 09/17/15 1424 5\' 10"  (1.778 m)     Head Cir --      Peak Flow --      Pain Score --      Pain Loc --      Pain Edu? --      Excl. in [place]? --     Constitutional: Alert and oriented. Well appearing and in no acute distress. Eyes: Conjunctivae are normal. PERRL. EOMI. Head: Atraumatic. Nose: No congestion/rhinnorhea. Mouth/Throat: Mucous membranes are moist.   Neck: No stridor.   Cardiovascular: Normal rate, regular rhythm. Grossly normal heart sounds. Respiratory: Normal respiratory effort.  No retractions. Lungs CTAB. Gastrointestinal: Soft and nontender. No distention.  Musculoskeletal: No lower extremity tenderness nor edema.  No joint effusions. Neurologic:  Normal speech and language. No gross focal neurologic deficits are appreciated. No gait instability. Skin:  Skin is warm, dry and intact. No rash noted. Psychiatric: Depressed mood with slow speech.  ____________________________________________   LABS (all labs ordered are listed, but only abnormal results are displayed)  Labs Reviewed  COMPREHENSIVE METABOLIC PANEL - Abnormal; Notable for the following:    Sodium 126 (*)    Chloride 93 (*)    Glucose, Bld 101 (*)    All other components within normal limits  ACETAMINOPHEN LEVEL - Abnormal; Notable for the following:    Acetaminophen (Tylenol), Serum <10 (*)    All other components within normal  limits  CBC - Abnormal; Notable for the following:    RBC 4.28 (*)    HCT 38.5 (*)    All other components within normal limits  ETHANOL  SALICYLATE  LEVEL  URINE DRUG SCREEN, QUALITATIVE ([facility] ONLY)   ____________________________________________  EKG   ____________________________________________  RADIOLOGY   ____________________________________________   PROCEDURES  ____________________________________________   INITIAL IMPRESSION / ASSESSMENT AND PLAN / ED COURSE  Pertinent labs & imaging results that were available during my care of the patient were reviewed by me and considered in my medical decision making (see chart for details).  Patient with concerning story for severe depression including thoughts of suicide. Also with a highly depressed mood. We will involuntarily commit the patient. He is aware of this and that Dr. Weber Cooks will be consult to see him before he can leave the emergency department. He understands the plan and is willing to comply. ____________________________________________   FINAL CLINICAL IMPRESSION(S) / ED DIAGNOSES  Depression. Suicidal thoughts.    NEW MEDICATIONS STARTED DURING THIS VISIT:  New Prescriptions   No medications on file     Note:  This document was prepared using Dragon voice recognition software and may include unintentional dictation errors.    Orbie Pyo, MD 09/17/15 1537  Patient sodium found to be 126. Patient says that he does not drink in excess amount of water. Says that he drinks about 4 medium sized glasses of water per day.  Orbie Pyo, MD 09/17/15 [phone]

## 2015-09-17 NOTE — ED Notes (Signed)
[facility] checked patient's home CPAP machine and patient able to use personal CPAP machine while in the hospital.

## 2015-09-17 NOTE — ED Notes (Signed)
Day shift tech asked night shift tech to preform 1830 EKG at 1915.EKG was given to ER Dr.Shavietz. RN notified of EKG completion

## 2015-09-17 NOTE — BH Assessment (Signed)
Assessment Note  Matthew Patrick is an 70 y.o. male who presents to the ER after being referred by his psychiatrist. Patient states his depression and worsened over the course or two months. "I'm so depressed that I can do nothing. I can't function. 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 "I was very active and creative. Now I can't even find the strength to watch a movie. I don't have the motivation to do anything."  Per ER notes, the patient's outpatient psychiatrist, Dr. Toy Care, told the patient to come to the ER and asked for Dr. Weber Cooks and request ECT.  Patient denies the use of illegal drugs. He was drinking two to three glasses of wine. He stop drinking three weeks ago, due to his recent medication changes.  He is having SI with no plans. He is scared because the thoughts are increasing.  Diagnosis: Depression  Past Medical History:  Past Medical History  Diagnosis Date  . Hypertension   . Hyperlipemia   . Anxiety   . Asthma     Past Surgical History  Procedure Laterality Date  . Neck skin tag biopsy    . 2d echocardiogram  03/27/11  . Tonsillectomy and adenoidectomy    . Cataract extraction      Family History:  Family History  Problem Relation Age of Onset  . Emphysema Mother   . Allergies Mother   . Heart disease Father     Social History:  reports that he quit smoking about 48 years ago. His smoking use included Cigarettes. He has a 5 pack-year smoking history. He does not have any smokeless tobacco history on file. He reports that he drinks about 4.2 oz of alcohol per week. He reports that he does not use illicit drugs.  Additional Social History:  Alcohol / Drug Use Pain Medications: See PTA Prescriptions: See PTA Over the Counter: See PTA History of alcohol / drug use?: Yes Longest period of sobriety (when/how long): 3 weeks Negative Consequences of Use: Personal relationships (Reports of none) Withdrawal  Symptoms:  (Reports of none) Substance #1 Name of Substance 1: Alcohol (Red Wine) 1 - Age of First Use: 16 1 - Amount (size/oz): "Two to three glasses of red wine, with my dinner" 1 - Frequency: Daily 1 - Duration: "Oh, for years" 1 - Last Use / Amount: "Three weeks ago (08/2015)"  CIWA: CIWA-Ar BP: 115/66 mmHg Pulse Rate: 77 COWS:    Allergies: No Known Allergies  Home Medications:  (Not in a hospital admission)  OB/GYN Status:  No LMP for male patient.  General Assessment Data Location of Assessment: [facility] ED TTS Assessment: In system Is this a Tele or Face-to-Face Assessment?: Face-to-Face Is this an Initial Assessment or a Re-assessment for this encounter?: Initial Assessment Marital status: Married [place] name: n/a Is patient pregnant?: No Pregnancy Status: No Living Arrangements: Spouse/significant other Can pt return to current living arrangement?: Yes Admission Status: Voluntary Is patient capable of signing voluntary admission?: Yes Referral Source: Self/Family/Friend Insurance type: Medicare  Medical Screening Exam ([place]) Medical Exam completed: Yes  Crisis Care Plan Living Arrangements: Spouse/significant other Legal Guardian: Other: (None ) Name of Psychiatrist: Dr. Layla Barter Name of Therapist: San Jetty  Education Status Is patient currently in school?: No Current Grade: n/a Highest grade of school patient has completed: BA Degree Name of school: n/a Contact person: n/a  Risk to self with the past 6 months Suicidal Ideation: Yes-Currently  Present Has patient been a risk to self within the past 6 months prior to admission? : No Suicidal Intent: No Has patient had any suicidal intent within the past 6 months prior to admission? : No Is patient at risk for suicide?: No Suicidal Plan?: No Has patient had any suicidal plan within the past 6 months prior to admission? : No Access to Means: No What has been your use of drugs/alcohol within  the last 12 months?: Wine Previous Attempts/Gestures: No How many times?: 0 Other Self Harm Risks: Reports of none Triggers for Past Attempts: None known Intentional Self Injurious Behavior: None Family Suicide History: No Recent stressful life event(s): Other (Comment), Loss (Comment), Financial Problems (Mother passed 08/2015) Persecutory voices/beliefs?: No Depression: Yes Depression Symptoms: Feeling angry/irritable, Feeling worthless/self pity, Loss of interest in usual pleasures, Guilt, Fatigue, Isolating, Tearfulness Substance abuse history and/or treatment for substance abuse?: No Suicide prevention information given to non-admitted patients: Not applicable  Risk to Others within the past 6 months Homicidal Ideation: No Does patient have any lifetime risk of violence toward others beyond the six months prior to admission? : No Thoughts of Harm to Others: No Current Homicidal Intent: No Current Homicidal Plan: No Access to Homicidal Means: No Identified Victim: Reports of none History of harm to others?: No Assessment of Violence: None Noted Violent Behavior Description: Reports of none Does patient have access to weapons?: No Criminal Charges Pending?: No Does patient have a court date: No Is patient on probation?: No  Psychosis Hallucinations: None noted Delusions: None noted  Mental Status Report Appearance/Hygiene: Unremarkable, In scrubs, In hospital gown Eye Contact: Fair Motor Activity: Freedom of movement, Unremarkable Speech: Logical/coherent Level of Consciousness: Alert Mood: Depressed, Sad, Pleasant Affect: Appropriate to circumstance, Sad, Depressed Anxiety Level: Minimal Thought Processes: Coherent, Relevant Judgement: Unimpaired Orientation: Person, Place, Time, Situation, Appropriate for developmental age Obsessive Compulsive Thoughts/Behaviors: Minimal  Cognitive Functioning Concentration: Normal Memory: Recent Intact, Remote Intact IQ:  Average Insight: Fair Impulse Control: Poor Appetite: Fair Weight Loss: 10 (Within 6 weeks) Weight Gain: 0 Sleep: No Change Total Hours of Sleep: 6 ("On a good night") Vegetative Symptoms: None  ADLScreening [facility] Assessment Services) Patient's cognitive ability adequate to safely complete daily activities?: Yes Patient able to express need for assistance with ADLs?: Yes Independently performs ADLs?: Yes (appropriate for developmental age)  Prior Inpatient Therapy Prior Inpatient Therapy: No Prior Therapy Dates: Reports of none Prior Therapy Facilty/Provider(s): Reports of none Reason for Treatment: Reports of none  Prior Outpatient Therapy Prior Outpatient Therapy: Yes Prior Therapy Dates: current Prior Therapy Facilty/Provider(s): [facility] Caprice Kluver Reason for Treatment: depression, anxiety Does patient have an ACCT team?: No Does patient have Intensive In-House Services?  : No Does patient have [facility] services? : No Does patient have P4CC services?: No  ADL Screening (condition at time of admission) Patient's cognitive ability adequate to safely complete daily activities?: Yes Is the patient deaf or have difficulty hearing?: No Does the patient have difficulty seeing, even when wearing glasses/contacts?: No Does the patient have difficulty concentrating, remembering, or making decisions?: No Patient able to express need for assistance with ADLs?: Yes Does the patient have difficulty dressing or bathing?: No Independently performs ADLs?: Yes (appropriate for developmental age) Does the patient have difficulty walking or climbing stairs?: No Weakness of Legs: None Weakness of Arms/Hands: None  Home Assistive Devices/Equipment Home Assistive Devices/Equipment: None  Therapy Consults (therapy consults require a physician order) PT Evaluation Needed: No OT Evalulation Needed: No SLP  Evaluation Needed: No Abuse/Neglect Assessment (Assessment to be complete while  patient is alone) Physical Abuse: Denies Verbal Abuse: Yes, past (Comment) (Previous Technical brewer) Sexual Abuse: Denies Exploitation of patient/patient's resources: Denies Self-Neglect: Denies Values / Beliefs Cultural Requests During Hospitalization: None Spiritual Requests During Hospitalization: None Consults Spiritual Care Consult Needed: No Social Work Consult Needed: No Regulatory affairs officer (For Healthcare) Does patient have an advance directive?: No Would patient like information on creating an advanced directive?: No - patient declined information    Additional Information 1:1 In Past 12 Months?: No CIRT Risk: No Elopement Risk: No Does patient have medical clearance?: Yes  Child/Adolescent Assessment Running Away Risk: Denies (Patient is an adult)  Disposition:  Disposition Initial Assessment Completed for this Encounter: Yes Disposition of Patient: Other dispositions (ER ordered Psych Consult)  On Site Evaluation by:   Reviewed with Physician:    Gunnar Fusi MS, LCAS, [place], [place], CCSI Therapeutic Triage Specialist 09/17/2015 5:55 PM

## 2015-09-17 NOTE — Consult Note (Addendum)
[facility] Consult   Reason for Consult:  Consult for this 70 year old man with a history of depression who came to the emergency room specifically at the advice of his doctor to seek ECT Referring Physician:  Archie Balboa Patient Identification: Matthew Patrick MRN:  [id number] Principal Diagnosis: Severe recurrent major depression without psychotic features [facility]) Diagnosis:   Patient Active Problem List   Diagnosis Date Noted  . Severe recurrent major depression without psychotic features ([place]) [F33.2] 09/17/2015  . Suicidal ideation [R45.851] 09/17/2015  . Cough [R05] 01/19/2015  . Restless leg [G25.81] 01/19/2015  . COLD (chronic obstructive lung disease) ([place]) [J44.9] 05/22/2014  . Lung nodule < 6cm on CT [R91.1] 05/22/2014  . OSA (obstructive sleep apnea) [G47.33] 07/11/2013  . Lung nodule [R91.1] 05/11/2013  . Nocturnal hypoxemia [G47.34] 05/11/2013  . Obstructive lung disease ([place]) [J44.9] 04/17/2013  . Chronic cough [R05] 04/17/2013  . Syncope [R55] 03/25/2011  . Hypertension [I10] 03/25/2011  . Hypokalemia [E87.6] 03/25/2011  . Asthma [J45.909] 03/25/2011  . Sciatica [M54.30] 03/25/2011  . SOB (shortness of breath) [R06.02] 03/25/2011    Total Time spent with patient: 1 hour  Subjective:   Matthew Patrick is a 70 y.o. male patient admitted with "I am so depressed I can hardly stand it".  HPI:  Patient interviewed. Chart reviewed. Vitals and labs reviewed. Case discussed with TTS and ER physician. This 70 year old gentleman came voluntarily to the emergency room at the suggestion of his outpatient psychiatrist, Dr. Toy Care. Patient reports he has been severely depressed with this episode going on for several weeks. He does not recall a specific trigger that set it off but has multiple stresses in his life. Worries constantly about finances and about what he should do with himself. Obsesses quite a bit. Patient says he is not functioning well. He can't find the energy to do  anything during the day. He sleeps poorly at night despite medication. Appetite has been poor. Patient has had suicidal thoughts with feelings of hopelessness. He has not made any suicide attempts but reports that the thoughts of gotten to the point of making his wife and himself afraid for his safety. He has spoken to his outpatient psychiatrist and she has changed his medicine several times but is now suggesting to him that he consider ECT. His complaints of apparently gotten so insistent she recommended he come to the emergency room with the idea that that could get him ECT more quickly. He has asthma but no more specific or worse acute medical problems. Patient denies that he is abusing any drugs or alcohol currently. He says he is compliant with his current medicines which are lamotrigine 25 mg a day, Seroquel at an unknown dose at night and lorazepam 1 mg 4 times a day graft social history: Patient  Social history: Patient is married lives with his wife. He is a retired Research officer, [employer]. Sounds like at his baseline he has a pretty active social life but currently has little or no activity.  Medical history: History of high blood pressure history of recurrent low potassium, history of asthma, sleep apnea  Substance abuse history: Patient reports that he drinks relatively little. Denies a history of alcohol abuse. Denies a history of abuse of other drugs.  Past Psychiatric History: Patient reports he has had depressive problems essentially all of his life. He's been seeing his current psychiatrist for years. The only past medicines he can remember were trial of Prozac and Cymbalta. He thinks the highest Prozac dose he  was ever on was 40 mg a day. According to the medicine reconciliation looks like he was on Abilify at some point but he doesn't remember it. No history of suicide attempts. No prior psychiatric hospitalizations. No reported manic symptoms or history of psychosis.  Risk to Self: Suicidal  Ideation: Yes-Currently Present Suicidal Intent: No Is patient at risk for suicide?: No Suicidal Plan?: No Access to Means: No What has been your use of drugs/alcohol within the last 12 months?: Wine How many times?: 0 Other Self Harm Risks: Reports of none Triggers for Past Attempts: None known Intentional Self Injurious Behavior: None Risk to Others: Homicidal Ideation: No Thoughts of Harm to Others: No Current Homicidal Intent: No Current Homicidal Plan: No Access to Homicidal Means: No Identified Victim: Reports of none History of harm to others?: No Assessment of Violence: None Noted Violent Behavior Description: Reports of none Does patient have access to weapons?: No Criminal Charges Pending?: No Does patient have a court date: No Prior Inpatient Therapy: Prior Inpatient Therapy: No Prior Therapy Dates: Reports of none Prior Therapy Facilty/Provider(s): Reports of none Reason for Treatment: Reports of none Prior Outpatient Therapy: Prior Outpatient Therapy: Yes Prior Therapy Dates: current Prior Therapy Facilty/Provider(s): [facility] Caprice Kluver Reason for Treatment: depression, anxiety Does patient have an ACCT team?: No Does patient have Intensive In-House Services?  : No Does patient have [facility] services? : No Does patient have P4CC services?: No  Past Medical History:  Past Medical History  Diagnosis Date  . Hypertension   . Hyperlipemia   . Anxiety   . Asthma     Past Surgical History  Procedure Laterality Date  . Neck skin tag biopsy    . 2d echocardiogram  03/27/11  . Tonsillectomy and adenoidectomy    . Cataract extraction     Family History:  Family History  Problem Relation Age of Onset  . Emphysema Mother   . Allergies Mother   . Heart disease Father    Family Psychiatric  History: Patient thinks that it's possible that his father may have had mood instability but there is nothing any more specific than that. Social History:  History   Alcohol Use  . 4.2 oz/week  . 7 Glasses of wine per week    Comment: 2-4 glasses of wine per night     History  Drug Use No    Social History   Social History  . Marital Status: Significant Other    Spouse Name: N/A  . Number of Children: N/A  . Years of Education: N/A   Occupational History  . retired    Social History Main Topics  . Smoking status: Former Smoker -- 1.00 packs/day for 5 years    Types: Cigarettes    Quit date: 04/25/1967  . Smokeless tobacco: None  . Alcohol Use: 4.2 oz/week    7 Glasses of wine per week     Comment: 2-4 glasses of wine per night  . Drug Use: No  . Sexual Activity: Not Asked   Other Topics Concern  . None   Social History Narrative   Additional Social History:    Allergies:  No Known Allergies  Labs:  Results for orders placed or performed during the hospital encounter of 09/17/15 (from the past 48 hour(s))  Urine Drug Screen, Qualitative     Status: Abnormal   Collection Time: 09/17/15  2:43 PM  Result Value Ref Range   Tricyclic, Ur Screen NONE DETECTED NONE DETECTED  Amphetamines, Ur Screen NONE DETECTED NONE DETECTED   MDMA (Ecstasy)Ur Screen NONE DETECTED NONE DETECTED   Cocaine Metabolite,Ur [place] NONE DETECTED NONE DETECTED   Opiate, Ur Screen NONE DETECTED NONE DETECTED   Phencyclidine (PCP) Ur S NONE DETECTED NONE DETECTED   Cannabinoid 50 Ng, Ur [place] NONE DETECTED NONE DETECTED   Barbiturates, Ur Screen NONE DETECTED NONE DETECTED   Benzodiazepine, Ur Scrn POSITIVE (A) NONE DETECTED   Methadone Scn, Ur NONE DETECTED NONE DETECTED    Comment: (NOTE) 619  Tricyclics, urine               Cutoff 1000 ng/mL 200  Amphetamines, urine             Cutoff 1000 ng/mL 300  MDMA (Ecstasy), urine           Cutoff 500 ng/mL 400  Cocaine Metabolite, urine       Cutoff 300 ng/mL 500  Opiate, urine                   Cutoff 300 ng/mL 600  Phencyclidine (PCP), urine      Cutoff 25 ng/mL 700  Cannabinoid, urine              Cutoff 50  ng/mL 800  Barbiturates, urine             Cutoff 200 ng/mL 900  Benzodiazepine, urine           Cutoff 200 ng/mL 1000 Methadone, urine                Cutoff 300 ng/mL 1100 1200 The urine drug screen provides only a preliminary, unconfirmed 1300 analytical test result and should not be used for non-medical 1400 purposes. Clinical consideration and professional judgment should 1500 be applied to any positive drug screen result due to possible 1600 interfering substances. A more specific alternate chemical method 1700 must be used in order to obtain a confirmed analytical result.  1800 Gas chromato graphy / mass spectrometry (GC/MS) is the preferred 1900 confirmatory method.   Comprehensive metabolic panel     Status: Abnormal   Collection Time: 09/17/15  2:45 PM  Result Value Ref Range   Sodium 126 (L) 135 - 145 mmol/L   Potassium 4.1 3.5 - 5.1 mmol/L   Chloride 93 (L) 101 - 111 mmol/L   CO2 25 22 - 32 mmol/L   Glucose, Bld 101 (H) 65 - 99 mg/dL   BUN 9 6 - 20 mg/dL   Creatinine, Ser 0.92 0.61 - 1.24 mg/dL   Calcium 9.4 8.9 - 10.3 mg/dL   Total Protein 6.9 6.5 - 8.1 g/dL   Albumin 4.3 3.5 - 5.0 g/dL   AST 26 15 - 41 U/L   ALT 25 17 - 63 U/L   Alkaline Phosphatase 71 38 - 126 U/L   Total Bilirubin 0.5 0.3 - 1.2 mg/dL   GFR calc non Af Amer >60 >60 mL/min   GFR calc Af Amer >60 >60 mL/min    Comment: (NOTE) The eGFR has been calculated using the CKD EPI equation. This calculation has not been validated in all clinical situations. eGFR's persistently <60 mL/min signify possible Chronic Kidney Disease.    Anion gap 8 5 - 15  Ethanol     Status: None   Collection Time: 09/17/15  2:45 PM  Result Value Ref Range   Alcohol, Ethyl (B) <5 <5 mg/dL    Comment:        LOWEST  DETECTABLE LIMIT FOR SERUM ALCOHOL IS 5 mg/dL FOR MEDICAL PURPOSES ONLY   Salicylate level     Status: None   Collection Time: 09/17/15  2:45 PM  Result Value Ref Range   Salicylate Lvl <3.4 2.8 - 30.0  mg/dL  Acetaminophen level     Status: Abnormal   Collection Time: 09/17/15  2:45 PM  Result Value Ref Range   Acetaminophen (Tylenol), Serum <10 (L) 10 - 30 ug/mL    Comment:        THERAPEUTIC CONCENTRATIONS VARY SIGNIFICANTLY. A RANGE OF 10-30 ug/mL MAY BE AN EFFECTIVE CONCENTRATION FOR MANY PATIENTS. HOWEVER, SOME ARE BEST TREATED AT CONCENTRATIONS OUTSIDE THIS RANGE. ACETAMINOPHEN CONCENTRATIONS >150 ug/mL AT 4 HOURS AFTER INGESTION AND >50 ug/mL AT 12 HOURS AFTER INGESTION ARE OFTEN ASSOCIATED WITH TOXIC REACTIONS.   cbc     Status: Abnormal   Collection Time: 09/17/15  2:45 PM  Result Value Ref Range   WBC 10.4 3.8 - 10.6 K/uL   RBC 4.28 (L) 4.40 - 5.90 MIL/uL   Hemoglobin 13.7 13.0 - 18.0 g/dL   HCT 38.5 (L) 40.0 - 52.0 %   MCV 90.0 80.0 - 100.0 fL   MCH 32.1 26.0 - 34.0 pg   MCHC 35.7 32.0 - 36.0 g/dL   RDW 11.8 11.5 - 14.5 %   Platelets 264 150 - 440 K/uL    Current Facility-Administered Medications  Medication Dose Route Frequency Provider Last Rate Last Dose  . albuterol (PROVENTIL HFA;VENTOLIN HFA) 108 (90 Base) MCG/ACT inhaler 2 puff  2 puff Inhalation Q4H PRN Gonzella Lex, MD      . fluvoxaMINE (LUVOX) tablet 50 mg  50 mg Oral BID Gonzella Lex, MD      . LORazepam (ATIVAN) tablet 0.5 mg  0.5 mg Oral QID Gonzella Lex, MD      . QUEtiapine (SEROQUEL) tablet 100 mg  100 mg Oral QHS Gonzella Lex, MD      . tiotropium (SPIRIVA) inhalation capsule 18 mcg  18 mcg Inhalation Daily Gonzella Lex, MD       Current Outpatient Prescriptions  Medication Sig Dispense Refill  . ARIPiprazole (ABILIFY) 2 MG tablet Take 2 mg by mouth daily.    Marland Kitchen aspirin 81 MG tablet Take 81 mg by mouth daily.    . DULoxetine (CYMBALTA) 60 MG capsule Take 60 mg by mouth daily.    . fluticasone (FLONASE) 50 MCG/ACT nasal spray Place 2 sprays into both nostrils daily.    Marland Kitchen gabapentin (NEURONTIN) 100 MG capsule TAKE 2 CAPSULES AT BEDTIME 60 capsule 5  . ibuprofen (ADVIL,MOTRIN) 600  MG tablet Take 600 mg by mouth every 6 (six) hours as needed. For headache     . LORazepam (ATIVAN) 1 MG tablet Take 1 mg by mouth 2 (two) times daily.     . mirtazapine (REMERON) 15 MG tablet Take 15 mg by mouth at bedtime.     . phenylephrine (SUDAFED PE) 10 MG TABS tablet Take 10 mg by mouth every 4 (four) hours as needed.    Marland Kitchen PROAIR HFA 108 (90 Base) MCG/ACT inhaler INHALE 2 PUFFS INTO THE LUNGS EVERY 6 HOURS AS NEEDED FOR WHEEZING OR SHORTNESS OF BREATH 8.5 Inhaler 3  . SPIRIVA HANDIHALER 18 MCG inhalation capsule PLACE 1 CAPSULE (18 MCG TOTAL) INTO INHALER AND INHALE DAILY. 30 capsule 11    Musculoskeletal: Strength & Muscle Tone: within normal limits Gait & Station: normal Patient leans: N/A  Psychiatric Specialty Exam:  Physical Exam  Nursing note and vitals reviewed. Constitutional: He appears well-developed and well-nourished.  HENT:  Head: Normocephalic and atraumatic.  Eyes: Conjunctivae are normal. Pupils are equal, round, and reactive to light.  Neck: Normal range of motion.  Cardiovascular: Normal rate and normal heart sounds.   Respiratory: Effort normal. No respiratory distress.  GI: Soft.  Musculoskeletal: Normal range of motion.  Neurological: He is alert.  Skin: Skin is warm and dry.  Psychiatric: His mood appears anxious. His speech is delayed. He is agitated. He expresses impulsivity. He exhibits a depressed mood. He expresses suicidal ideation. He exhibits abnormal recent memory.    Review of Systems  Constitutional: Negative.   HENT: Negative.   Eyes: Negative.   Respiratory: Negative.   Cardiovascular: Negative.   Gastrointestinal: Negative.   Musculoskeletal: Negative.   Skin: Negative.   Neurological: Negative.   Psychiatric/Behavioral: Positive for depression, suicidal ideas and memory loss. Negative for hallucinations and substance abuse. The patient is nervous/anxious and has insomnia.     Blood pressure 115/66, pulse 77, temperature 98 F (36.7  C), temperature source Oral, resp. rate 18, height 5' 10" (1.778 m), weight 72.576 kg (160 lb), SpO2 96 %.Body mass index is 22.96 kg/(m^2).  General Appearance: Well Groomed  Eye Contact:  Fair  Speech:  Normal Rate  Volume:  Increased  Mood:  Anxious and Depressed  Affect:  Demonstratively anxious  Thought Process:  Goal Directed  Orientation:  Full (Time, Place, and Person)  Thought Content:  Rumination  Suicidal Thoughts:  Yes.  with intent/plan  Homicidal Thoughts:  No  Memory:  Immediate;   Good Recent;   Poor Remote;   Fair  Judgement:  Impaired  Insight:  Fair  Psychomotor Activity:  Decreased  Concentration:  Concentration: Poor  Recall:  [employer] of Knowledge:  Fair  Language:  Fair  Akathisia:  No  Handed:  Right  AIMS (if indicated):     Assets:  Communication Skills Desire for Improvement Financial Resources/Insurance Housing Resilience Social Support  ADL's:  Intact  Cognition:  Impaired,  Mild  Sleep:        Treatment Plan Summary: Daily contact with patient to assess and evaluate symptoms and progress in treatment, Medication management and Plan 70 year old man with recurrent major depression. Symptoms of been worsening recently and he appears to be in quite a bit of distress. He also emphasizes his anxiety and describes several times how he ruminates and obsesses on things. Patient was distressed enough to come into the emergency room in the first place. He talks about having worsening worries about hurting himself although he doesn't describe a specific plan. I think the safest thing to do especially now that he was put under IVC is to admit him to the hospital. I spent some time describing ECT with him and the patient is tentatively agreeable to treatment. I will go ahead and make sure we have an EKG and a chest x-ray ordered in addition to the usual labs. I have made it clear to him that the next ECT session will not be until next Wednesday. That means the  next time I will get to talk to him is Tuesday. In the interim I suggest making some changes to his medicine. Lamotrigine is contraindicated for ECT so we will discontinue that. I'm going to start Luvox 50 mg twice a day given the degree of obsessing he's having. Considered a nighttime Seroquel. I'm cutting his lorazepam dose in half. I explained  to him that that is also contraindicated with ECT and we will have to try to taper it off. Continue inhalers. Engage in groups and activities on the unit.   Patient also has a low sodium. I'm not sure what that's about. Not so low that he needs hospitalization for but we will recheck it in the morning. Disposition: Recommend psychiatric Inpatient admission when medically cleared. Supportive therapy provided about ongoing stressors.  Alethia Berthold, MD 09/17/2015 6:20 PM

## 2015-09-17 NOTE — ED Notes (Signed)
Pt referred by psychiatrists from [facility] to see Dr.Clapac, for possible ECT treatment , pt admits to being severely depressed, and not feeling safe to be at home. Pt has no specific plan to harm himself " I do not know what I would do or use"

## 2015-09-17 NOTE — ED Notes (Signed)
Patient states "I just want to go to sleep and not wake up"  "I feel so hopeless"  Reports being depressed for the past several months and that symptoms just seem to be worsening with medication.  Referred to [facility] today by Psychiatrist in [place] for possible ETC.

## 2015-09-18 DIAGNOSIS — F332 Major depressive disorder, recurrent severe without psychotic features: Secondary | ICD-10-CM

## 2015-09-18 LAB — LIPID PANEL
Cholesterol: 173 mg/dL (ref 0–200)
HDL: 58 mg/dL (ref >40)
LDL Cholesterol: 87 mg/dL (ref 0–99)
Total CHOL/HDL Ratio: 3 RATIO
Triglycerides: 140 mg/dL (ref <150)
VLDL: 28 mg/dL (ref 0–40)

## 2015-09-18 LAB — TSH: TSH: 1.019 u[IU]/mL (ref 0.350–4.500)

## 2015-09-18 LAB — BASIC METABOLIC PANEL
ANION GAP: 8 (ref 5–15)
BUN: 9 mg/dL (ref 6–20)
CHLORIDE: 96 mmol/L — AB (ref 101–111)
CO2: 23 mmol/L (ref 22–32)
Calcium: 9.4 mg/dL (ref 8.9–10.3)
Creatinine, Ser: 0.77 mg/dL (ref 0.61–1.24)
GFR calc non Af Amer: >60 mL/min (ref >60)
GLUCOSE: 100 mg/dL — AB (ref 65–99)
Potassium: 4 mmol/L (ref 3.5–5.1)
Sodium: 127 mmol/L — ABNORMAL LOW (ref 135–145)

## 2015-09-18 LAB — HEMOGLOBIN A1C: HEMOGLOBIN A1C: 5.7 % (ref 4.0–6.0)

## 2015-09-18 MED ORDER — POLYETHYLENE GLYCOL 3350 17 G PO PACK
17.0000 g | PACK | Freq: Two times a day (BID) | ORAL | Status: DC
  Administered 2015-09-18 – 2015-09-20 (×5): 17 g via ORAL
  Filled 2015-09-18 (×4): qty 1

## 2015-09-18 MED ORDER — ENSURE ENLIVE PO LIQD
237.0000 mL | Freq: Two times a day (BID) | ORAL | Status: DC
  Administered 2015-09-18 – 2015-09-20 (×5): 237 mL via ORAL

## 2015-09-18 MED ORDER — HALOPERIDOL LACTATE 5 MG/ML IJ SOLN: 5.0000 mg | Freq: Three times a day (TID) | INTRAMUSCULAR | Status: DC | PRN

## 2015-09-18 MED ORDER — QUETIAPINE FUMARATE 100 MG PO TABS
100.0000 mg | ORAL_TABLET | Freq: Once | ORAL | Status: AC
  Administered 2015-09-18: 100 mg via ORAL
  Filled 2015-09-18: qty 1

## 2015-09-18 MED ORDER — QUETIAPINE FUMARATE 25 MG PO TABS
150.0000 mg | ORAL_TABLET | Freq: Every day | ORAL | Status: DC
  Administered 2015-09-18 – 2015-09-19 (×2): 150 mg via ORAL
  Filled 2015-09-18 (×2): qty 2

## 2015-09-18 MED ORDER — BISACODYL 10 MG RE SUPP
10.0000 mg | Freq: Every day | RECTAL | Status: DC | PRN
  Filled 2015-09-18: qty 1

## 2015-09-18 MED ORDER — HALOPERIDOL 2 MG PO TABS
4.0000 mg | ORAL_TABLET | Freq: Three times a day (TID) | ORAL | Status: DC | PRN
  Administered 2015-09-19: 4 mg via ORAL
  Filled 2015-09-18: qty 2

## 2015-09-18 MED ORDER — LORAZEPAM 0.5 MG PO TABS
0.5000 mg | ORAL_TABLET | Freq: Once | ORAL | Status: AC
  Administered 2015-09-18: 0.5 mg via ORAL
  Filled 2015-09-18: qty 1

## 2015-09-18 MED ORDER — MIRTAZAPINE 15 MG PO TABS
7.5000 mg | ORAL_TABLET | Freq: Every day | ORAL | Status: DC
Start: 2015-09-18 — End: 2015-09-19
  Administered 2015-09-18: 7.5 mg via ORAL
  Filled 2015-09-18: qty 2

## 2015-09-18 NOTE — Progress Notes (Signed)
Patient with depressed affect, slightly anxious rt recent admission to hospital. No SI/HI/AVH at this time. Patient states he "does not belong here and would like to discharge". Orient patient to admission process and evaluation. Wife phones unit and speaks with Probation officer requesting overview of current plan of care. Patient c/o constipation, lactose intolerance and trouble sleeping at night  Patient verbalizes needs well and writer speaks with MD rt patient requests. No SI/HI at this time. Safety maintained. Wife may visit this evening.

## 2015-09-18 NOTE — Tx Team (Signed)
Initial Interdisciplinary Treatment Plan   PATIENT STRESSORS: Financial difficulties Health problems   PATIENT STRENGTHS: General fund of knowledge Motivation for treatment/growth Supportive family/friends   PROBLEM LIST: Problem List/Patient Goals Date to be addressed Date deferred Reason deferred Estimated date of resolution  Depression  09/17/15           Anxiety  09/17/15                                          DISCHARGE CRITERIA:  Improved stabilization in mood, thinking, and/or behavior Motivation to continue treatment in a less acute level of care Reduction of life-threatening or endangering symptoms to within safe limits  PRELIMINARY DISCHARGE PLAN: Outpatient therapy Participate in family therapy  PATIENT/FAMIILY INVOLVEMENT: This treatment plan has been presented to and reviewed with the patient, Sujal, [place] patient and family have been given the opportunity to ask questions and make suggestions.  Haly Feher Abisola Monti Jilek 09/18/2015, 4:36 AM

## 2015-09-18 NOTE — Progress Notes (Signed)
Admission Note:  70 yr male who presents IVC in distress for the treatment of Anxiety and Depression. Pt affect is  flat and depressed. Pt was anxious, restless, and worried throughout the admission process. Pt denies SI and contracts for safety upon admission. Pt denies AVH . Pt experienced SI/ anhedonia  towards himself in past weeks. Patient stated "some of his stressors are financial issues, health issues and obessession with himself" Pt has Past medical Hx of Asthma,  Major Depression, HTN, COLD, sleep apnea, hypokalemia and syncope. Encouragement and reassurance given to patient.    Food and fluids offered, and fluids accepted. Pt was taken to his room, 15 minutes checks maintained, will continue to closely monitor.

## 2015-09-18 NOTE — [facility] Group Notes (Signed)
[place] LCSW Group Therapy  09/18/2015 2:04 PM  Type of Therapy:  Group Therapy  Participation Level:  Minimal  Participation Quality:  Attentive  Affect:  Flat  Cognitive:  Alert  Insight:  Limited  Engagement in Therapy:  Limited  Modes of Intervention:  Discussion, Education, Socialization and Support  Summary of Progress/Problems: Todays topic: Grudges  Patients will be encouraged to discuss their thoughts, feelings, and behaviors as to why one holds on to grudges and reasons why people have grudges. Patients will process the impact of grudges on their daily lives and identify thoughts and feelings related to holding grudges. Patients will identify feelings and thoughts related to what life would look like without grudges. Pt attended group and stayed the entire time. Pt sat quietly and listened to other group members share.   [place] MSW, [place]  09/18/2015, 2:04 PM

## 2015-09-18 NOTE — Progress Notes (Signed)
Pt placed on home CPAP. CPAP plugged into red outlet. Clinical has already checked and labeled machine.

## 2015-09-18 NOTE — Plan of Care (Signed)
Problem: Education: Goal: Emotional status will improve Outcome: Progressing With support and encouragement , patient able to participate in plan of care.

## 2015-09-18 NOTE — H&P (Signed)
Psychiatric Admission Assessment Adult  Patient Identification: Matthew Patrick MRN:  [id number] Date of Evaluation:  09/18/2015 Chief Complaint:  Depression Principal Diagnosis: Severe recurrent major depression without psychotic features [facility]) Diagnosis:   Patient Active Problem List   Diagnosis Date Noted  . Severe recurrent major depression without psychotic features ([place]) [F33.2] 09/17/2015  . Suicidal ideation [R45.851] 09/17/2015  . Cough [R05] 01/19/2015  . Restless leg [G25.81] 01/19/2015  . COLD (chronic obstructive lung disease) ([place]) [J44.9] 05/22/2014  . Lung nodule < 6cm on CT [R91.1] 05/22/2014  . OSA (obstructive sleep apnea) [G47.33] 07/11/2013  . Lung nodule [R91.1] 05/11/2013  . Nocturnal hypoxemia [G47.34] 05/11/2013  . Obstructive lung disease ([place]) [J44.9] 04/17/2013  . Chronic cough [R05] 04/17/2013  . Syncope [R55] 03/25/2011  . Hypertension [I10] 03/25/2011  . Hypokalemia [E87.6] 03/25/2011  . Asthma [J45.909] 03/25/2011  . Sciatica [M54.30] 03/25/2011  . SOB (shortness of breath) [R06.02] 03/25/2011   History of Present Illness:  70 year old man with a history of depression who went to the emergency room to be treated with ECT.  Please see consult note for more information. Patient's chart, labs, vitals, reviewed. Today, patient is very upset and states that he does not need to be here. He spent most of the interview perseverating on not needing to be in the hospital and "feeling like I am in jail." The information that he provided was limited due to his perseveration.  He states that he has been depressed "for a long while." He is unable to focus while at home. He enjoys making jewelry with his wife but has not been able to do that lately. Has difficulty concentrating and considerable anxiety. Patient perseverated on being cold in his room, wanting to drink more water, and not being "stuck in the hospital for four days to get ECT." He says that his is "miserable  here" and something has to be done. Per consult note, patient is IVC'd.  Depressed. Unhappy to be here.  Can't function. I'm absolutely unable to read interact. Can't do crafts. We used to make jewelry but wife got to the point that she couldn't cope with the his depression  He was concerned about the doses of his Ativan and Seroquel being decreased and it was explained that this is done to prepare him for ECT. Towards the end of the exam the patient complained again that his medication doses were lower than at home and seemed to have no recollection of our discussion previously regarding this. He states that he is losing weight because he is not eating. He also is constipated but does not want a laxative that will hurt his stomach.   Prior to ending the interview the patient states that he "can't do this and this is more than I can handle." Patient encouraged to be patient with the process and use coping skills to manage his anxiety while preparing for ECT.  He would not answer questions regarding SI/HI/AVH but alluded to feeling hopeless and then steered the conversation back to needing to go home.    Per Consult note: Patient interviewed. Chart reviewed. Vitals and labs reviewed. Case discussed with TTS and ER physician. This 70 year old gentleman came voluntarily to the emergency room at the suggestion of his outpatient psychiatrist, Dr. Toy Care. Patient reports he has been severely depressed with this episode going on for several weeks. He does not recall a specific trigger that set it off but has multiple stresses in his life. Worries constantly about finances and about  what he should do with himself. Obsesses quite a bit. Patient says he is not functioning well. He can't find the energy to do anything during the day. He sleeps poorly at night despite medication. Appetite has been poor. Patient has had suicidal thoughts with feelings of hopelessness. He has not made any suicide attempts but reports that the  thoughts of gotten to the point of making his wife and himself afraid for his safety. He has spoken to his outpatient psychiatrist and she has changed his medicine several times but is now suggesting to him that he consider ECT. His complaints of apparently gotten so insistent she recommended he come to the emergency room with the idea that that could get him ECT more quickly. He has asthma but no more specific or worse acute medical problems. Patient denies that he is abusing any drugs or alcohol currently. He says he is compliant with his current medicines which are lamotrigine 25 mg a day, Seroquel at an unknown dose at night and lorazepam 1 mg 4 times a day graft   Associated Signs/Symptoms: Depression Symptoms:  depressed mood, insomnia, psychomotor agitation, difficulty concentrating, hopelessness, impaired memory, anxiety, weight loss, decreased appetite, (Hypo) Manic Symptoms:  Distractibility, Irritable Mood, Anxiety Symptoms:  Excessive Worry, Panic Symptoms, Psychotic Symptoms:  none PTSD Symptoms: NA Total Time spent with patient: 1 hour  Past Psychiatric History: depression -per consult note: Patient reports he has had depressive problems essentially all of his life. He's been seeing his current psychiatrist for years. The only past medicines he can remember were trial of Prozac and Cymbalta. He thinks the highest Prozac dose he was ever on was 40 mg a day. According to the medicine reconciliation looks like he was on Abilify at some point but he doesn't remember it. No history of suicide attempts. No prior psychiatric hospitalizations. No reported manic symptoms or history of psychosis.  Is the patient at risk to self? No.  Has the patient been a risk to self in the past 6 months? Yes.    Has the patient been a risk to self within the distant past? No.  Is the patient a risk to others? No.  Has the patient been a risk to others in the past 6 months? No.  Has the patient been  a risk to others within the distant past? No.   Prior Inpatient Therapy:   Prior Outpatient Therapy:    Alcohol Screening: 1. How often do you have a drink containing alcohol?: Monthly or less 2. How many drinks containing alcohol do you have on a typical day when you are drinking?: 1 or 2 3. How often do you have six or more drinks on one occasion?: Never Preliminary Score: 0 4. How often during the last year have you found that you were not able to stop drinking once you had started?: Never 5. How often during the last year have you failed to do what was normally expected from you becasue of drinking?: Never 6. How often during the last year have you needed a first drink in the morning to get yourself going after a heavy drinking session?: Never 7. How often during the last year have you had a feeling of guilt of remorse after drinking?: Never 8. How often during the last year have you been unable to remember what happened the night before because you had been drinking?: Never 9. Have you or someone else been injured as a result of your drinking?: No 10. Has a  relative or friend or a doctor or another health worker been concerned about your drinking or suggested you cut down?: No Alcohol Use Disorder Identification Test Final Score (AUDIT): 1 Brief Intervention: AUDIT score less than 7 or less-screening does not suggest unhealthy drinking-brief intervention not indicated Substance Abuse History in the last 12 months:  No. Consequences of Substance Abuse: Negative/none Previous Psychotropic Medications: seroquel/lamictal Psychological Evaluations: Yes  Past Medical History:  Past Medical History  Diagnosis Date  . Hypertension   . Hyperlipemia   . Anxiety   . Asthma     Past Surgical History  Procedure Laterality Date  . Neck skin tag biopsy    . 2d echocardiogram  03/27/11  . Tonsillectomy and adenoidectomy    . Cataract extraction     Family History:  Family History   Problem Relation Age of Onset  . Emphysema Mother   . Allergies Mother   . Heart disease Father    Family Psychiatric  History: denies. Possible mood issue with father Tobacco Screening: @FLOW ([phone])::1)@ Social History:  History  Alcohol Use  . 4.2 oz/week  . 7 Glasses of wine per week    Comment: 2-4 glasses of wine per night     History  Drug Use No    Additional Social History:             Social History Main Topics  . Smoking status: Former Smoker -- 1.00 packs/day for 5 years    Types: Cigarettes    Quit date: 04/25/1967  . Smokeless tobacco: None  . Alcohol Use: 4.2 oz/week    7 Glasses of wine per week     Comment: 2-4 glasses of wine per night  . Drug Use: No  . Sexual Activity: Not Asked                       Allergies:  No Known Allergies Lab Results:  Results for orders placed or performed during the hospital encounter of 09/17/15 (from the past 48 hour(s))  Lipid panel, fasting     Status: None   Collection Time: 09/17/15  2:45 PM  Result Value Ref Range   Cholesterol 173 0 - 200 mg/dL   Triglycerides 140 <150 mg/dL   HDL 58 >40 mg/dL   Total CHOL/HDL Ratio 3.0 RATIO   VLDL 28 0 - 40 mg/dL   LDL Cholesterol 87 0 - 99 mg/dL    Comment:        Total Cholesterol/HDL:CHD Risk Coronary Heart Disease Risk Table                     Men   Women  1/2 Average Risk   3.4   3.3  Average Risk       5.0   4.4  2 X Average Risk   9.6   7.1  3 X Average Risk  23.4   11.0        Use the calculated Patient Ratio above and the CHD Risk Table to determine the patient's CHD Risk.        ATP III CLASSIFICATION (LDL):  <100     mg/dL   Optimal  100-129  mg/dL   Near or Above                    Optimal  130-159  mg/dL   Borderline  160-189  mg/dL   High  >190     mg/dL  Very High   TSH     Status: None   Collection Time: 09/17/15  2:45 PM  Result Value Ref Range   TSH 1.019 0.350 - 4.500 uIU/mL  Basic  metabolic panel     Status: Abnormal   Collection Time: 09/18/15  7:26 AM  Result Value Ref Range   Sodium 127 (L) 135 - 145 mmol/L   Potassium 4.0 3.5 - 5.1 mmol/L   Chloride 96 (L) 101 - 111 mmol/L   CO2 23 22 - 32 mmol/L   Glucose, Bld 100 (H) 65 - 99 mg/dL   BUN 9 6 - 20 mg/dL   Creatinine, Ser 0.77 0.61 - 1.24 mg/dL   Calcium 9.4 8.9 - 10.3 mg/dL   GFR calc non Af Amer >60 >60 mL/min   GFR calc Af Amer >60 >60 mL/min    Comment: (NOTE) The eGFR has been calculated using the CKD EPI equation. This calculation has not been validated in all clinical situations. eGFR's persistently <60 mL/min signify possible Chronic Kidney Disease.    Anion gap 8 5 - 15    Blood Alcohol level:  Lab Results  Component Value Date   ETH <5 80/06/4915    Metabolic Disorder Labs:  Lab Results  Component Value Date   HGBA1C 5.7* 03/26/2011   MPG 117* 03/26/2011   No results found for: PROLACTIN Lab Results  Component Value Date   CHOL 173 09/17/2015   TRIG 140 09/17/2015   HDL 58 09/17/2015   CHOLHDL 3.0 09/17/2015   VLDL 28 09/17/2015   LDLCALC 87 09/17/2015   LDLCALC 43 03/26/2011   LDLCALC 46 03/26/2011    Current Medications: Current Facility-Administered Medications  Medication Dose Route Frequency Provider Last Rate Last Dose  . acetaminophen (TYLENOL) tablet 650 mg  650 mg Oral Q6H PRN Gonzella Lex, MD      . albuterol (PROVENTIL) (2.5 MG/3ML) 0.083% nebulizer solution 3 mL  3 mL Inhalation Q4H PRN Gonzella Lex, MD      . alum & mag hydroxide-simeth (MAALOX/MYLANTA) 200-200-20 MG/5ML suspension 30 mL  30 mL Oral Q4H PRN Gonzella Lex, MD      . bisacodyl (DULCOLAX) suppository 10 mg  10 mg Rectal Daily PRN Joanann Mies L Terrin Imparato, MD      . feeding supplement (ENSURE ENLIVE) (ENSURE ENLIVE) liquid 237 mL  237 mL Oral BID BM Gonzella Lex, MD   237 mL at 09/18/15 1400  . fluvoxaMINE (LUVOX) tablet 50 mg  50 mg Oral BID Gonzella Lex, MD   50 mg at 09/18/15 0912  . LORazepam  (ATIVAN) tablet 0.5 mg  0.5 mg Oral QID Gonzella Lex, MD   0.5 mg at 09/18/15 1307  . magnesium hydroxide (MILK OF MAGNESIA) suspension 30 mL  30 mL Oral Daily PRN Gonzella Lex, MD   30 mL at 09/18/15 0914  . mirtazapine (REMERON) tablet 7.5 mg  7.5 mg Oral QHS Cyd Hostler L Taydem Cavagnaro, MD      . polyethylene glycol (MIRALAX / GLYCOLAX) packet 17 g  17 g Oral BID Damarkus Balis L Yvonna Brun, MD      . QUEtiapine (SEROQUEL) tablet 100 mg  100 mg Oral QHS Gonzella Lex, MD      . Derrill Memo ON 09/19/2015] tiotropium (SPIRIVA) inhalation capsule 18 mcg  18 mcg Inhalation Daily Gonzella Lex, MD       PTA Medications: Prescriptions prior to admission  Medication Sig Dispense Refill Last Dose  . ARIPiprazole (ABILIFY)  2 MG tablet Take 2 mg by mouth daily.   09/17/2015 at Unknown time  . aspirin 81 MG tablet Take 81 mg by mouth daily.   09/17/2015 at Unknown time  . DULoxetine (CYMBALTA) 60 MG capsule Take 60 mg by mouth daily.   Past Week at Unknown time  . fluticasone (FLONASE) 50 MCG/ACT nasal spray Place 2 sprays into both nostrils daily.   Past Month at Unknown time  . gabapentin (NEURONTIN) 100 MG capsule TAKE 2 CAPSULES AT BEDTIME 60 capsule 5 09/17/2015 at Unknown time  . ibuprofen (ADVIL,MOTRIN) 600 MG tablet Take 600 mg by mouth every 6 (six) hours as needed. For headache    Past Month at Unknown time  . LORazepam (ATIVAN) 1 MG tablet Take 1 mg by mouth 2 (two) times daily.    09/17/2015 at Unknown time  . mirtazapine (REMERON) 15 MG tablet Take 15 mg by mouth at bedtime.    09/17/2015 at Unknown time  . SPIRIVA HANDIHALER 18 MCG inhalation capsule PLACE 1 CAPSULE (18 MCG TOTAL) INTO INHALER AND INHALE DAILY. 30 capsule 11 Past Month at Unknown time  . phenylephrine (SUDAFED PE) 10 MG TABS tablet Take 10 mg by mouth every 4 (four) hours as needed.   Taking  . PROAIR HFA 108 (90 Base) MCG/ACT inhaler INHALE 2 PUFFS INTO THE LUNGS EVERY 6 HOURS AS NEEDED FOR WHEEZING OR SHORTNESS OF BREATH 8.5 Inhaler 3      Musculoskeletal: Strength & Muscle Tone: within normal limits Gait & Station: normal Patient leans: N/A  Psychiatric Specialty Exam: Physical Exam Physical Exam  Nursing note and vitals reviewed. Concur with previous PE  Psychiatric: His mood appears anxious. His speech is delayed. He is agitated. He expresses impulsivity. He exhibits a depressed mood. He expresses suicidal ideation. He exhibits abnormal recent memory.   ROS Review of Systems  Constitutional: Negative.  HENT: Negative.  Eyes: Negative.  Respiratory: Negative.  Cardiovascular: Negative.  Gastrointestinal: Negative.  Musculoskeletal: Negative.  Skin: Negative.  Neurological: Negative.  Psychiatric/Behavioral: Positive for depression, vague suicidal ideas and memory loss. Negative for hallucinations and substance abuse. The patient is nervous/anxious and has insomnia.   Blood pressure 123/93, pulse 78, temperature 97.9 F (36.6 C), temperature source Oral, resp. rate 18, height 5' 10"  (1.778 m), weight 71.215 kg (157 lb), SpO2 98 %.Body mass index is 22.53 kg/(m^2).  General Appearance: Neat  Eye Contact:  Fair  Speech:  Normal Rate  Volume:  Increased  Mood:  Anxious, Depressed and Irritable  Affect:  Depressed and Labile  Thought Process:  Goal Directed  Orientation:  Full (Time, Place, and Person)  Thought Content:  Obsessions and Rumination  Suicidal Thoughts:  Yes.  without intent/plan  Homicidal Thoughts:  No  Memory:  Immediate;   Fair Recent;   Poor Remote;   Fair  Judgement:  Impaired  Insight:  Lacking  Psychomotor Activity:  Restlessness  Concentration:  Concentration: Poor  Recall:  Poor  Fund of Knowledge:  Fair  Language:  Fair  Akathisia:  No  Handed:  Right  AIMS (if indicated):     Assets:  Desire for Improvement Financial Resources/Insurance Housing Resilience Social Support  ADL's:  Intact  Cognition:  Impaired,  Mild  Sleep:  Number of Hours: 71     70 year  old male with depression and suicidal intent with no clear plan/intent present for ECT. Patient is aware that he will not get ECT until Wednesday. In the meanwhile he is having  his medications adjusted to prepare. He complained of poor sleep, approx 3 hours, poor appetite, anhedonia, hopelessness, and other depressive symptoms. He has had hyponatremia which will be monitored. Patient prescribed Seroquel and Luvox to address sleep and anxiety along with decreased dose of Lorazepam in preparation for ECT. He is not happy to be admitted at this time,. He was IVC'd but claims that he is here voluntarily but needs to leave immediately. Patient has very poor insight into his disorder and perseverates on the reasons why he does not want to be here. Will likely require frequent redirection.  Treatment Plan Summary: Daily contact with patient to assess and evaluate symptoms and progress in treatment and Medication management PLAN:  Observation Level/Precautions:  Elop[place] Continuous Observation 15 minute checks  Suicide precautions  Laboratory:  as ordered, NA was low and rechecked. Will continue to monitor and consult medicine if it remains low.  EKG CKR - in preparation for ECT   Psychotherapy:  Per milieu, groups  Medications:  Luvox 50 mg PO BID Lorazepam decreased (cut in half yesterday) Seroquel will 150 mg PO  QHS Bowel Regimen- milk of Mag, miralax, Dulcolax    Consultations:    Discharge Concerns:    Estimated LOS:  Other:     I certify that inpatient services furnished can reasonably be expected to improve the patient's condition.    Jim Like, MD 5/27/20172:13 PM

## 2015-09-18 NOTE — Plan of Care (Signed)
Problem: Coping: Goal: Ability to verbalize feelings will improve Outcome: Progressing Patient able to verbalize concerns to staff.

## 2015-09-18 NOTE — Progress Notes (Signed)
NUTRITION ASSESSMENT  Pt identified as at risk on the Malnutrition Screen Tool  INTERVENTION: 1. Monitor intake and cater to pt preferences  2. Ensure enlive has been ordered BID for additional 350 kcals and 20 gm of protein each  NUTRITION DIAGNOSIS: Unintentional weight loss related to sub-optimal intake as evidenced by pt report.   Goal: Pt to meet >/= 90% of their estimated nutrition needs.  Monitor:  PO intake  Assessment:    70 y.o. male admitted with major depression and considering ECT treatments  Past Medical History  Diagnosis Date  . Hypertension   . Hyperlipemia   . Anxiety   . Asthma    Ate 100% of breakfast and lunch today.   Height: Ht Readings from Last 1 Encounters:  09/17/15 5\' 10"  (1.778 m)    Weight: 11% wt loss in the last 3 months Wt Readings from Last 1 Encounters:  09/17/15 157 lb (71.215 kg)    Weight Hx: Wt Readings from Last 10 Encounters:  09/17/15 157 lb (71.215 kg)  09/17/15 160 lb (72.576 kg)  06/21/15 177 lb (80.287 kg)  01/19/15 172 lb (78.019 kg)  05/22/14 173 lb (78.472 kg)  01/13/14 169 lb (76.658 kg)  11/12/13 165 lb 6.4 oz (75.025 kg)  09/17/13 162 lb (73.483 kg)  07/11/13 165 lb 9.6 oz (75.116 kg)  07/09/13 165 lb 9.6 oz (75.116 kg)    BMI:  Body mass index is 22.53 kg/(m^2).   Estimated Nutritional Needs: Kcal: 25-30 kcal/kg Protein: > 1 gram protein/kg Fluid: 1 ml/kcal  Diet Order: Diet regular Room service appropriate?: Yes; Fluid consistency:: Thin Pt is also offered choice of unit snacks.  Pt is eating as desired.   Lab results and medications reviewed.   Macaulay Reicher B. Zenia Resides, [place], [place] (pager) Weekend/On-Call pager [phone])

## 2015-09-18 NOTE — [facility] Suicide Risk Assessment (Signed)
[facility] Admission Suicide Risk Assessment   Nursing information obtained from:  Patient Demographic factors:  Age 70 or older, Male, Caucasian, Low socioeconomic status, Access to firearms Current Mental Status:  NA Loss Factors:  Decline in physical health, Financial problems / change in socioeconomic status Historical Factors:  NA Risk Reduction Factors:  Religious beliefs about death, Positive social support  Total Time spent with patient: 1 hour Principal Problem: Severe recurrent major depression without psychotic features ([place]) Diagnosis:   Patient Active Problem List   Diagnosis Date Noted  . Severe recurrent major depression without psychotic features ([place]) [F33.2] 09/17/2015  . Suicidal ideation [R45.851] 09/17/2015  . Cough [R05] 01/19/2015  . Restless leg [G25.81] 01/19/2015  . COLD (chronic obstructive lung disease) ([place]) [J44.9] 05/22/2014  . Lung nodule < 6cm on CT [R91.1] 05/22/2014  . OSA (obstructive sleep apnea) [G47.33] 07/11/2013  . Lung nodule [R91.1] 05/11/2013  . Nocturnal hypoxemia [G47.34] 05/11/2013  . Obstructive lung disease ([place]) [J44.9] 04/17/2013  . Chronic cough [R05] 04/17/2013  . Syncope [R55] 03/25/2011  . Hypertension [I10] 03/25/2011  . Hypokalemia [E87.6] 03/25/2011  . Asthma [J45.909] 03/25/2011  . Sciatica [M54.30] 03/25/2011  . SOB (shortness of breath) [R06.02] 03/25/2011   Subjective Data: patient does not want to be admitted. Unable to process information when questions are answered. Asking same question repeatedly regarding being able to leave. Admits to feeling overwhelmed and depressed.   Continued Clinical Symptoms:  Alcohol Use Disorder Identification Test Final Score (AUDIT): 1 The "Alcohol Use Disorders Identification Test", Guidelines for Use in Primary Care, Second Edition.  World Pharmacologist [facility]). Score between 0-7:  no or low risk or alcohol related problems. Score between 8-15:  moderate risk of alcohol related  problems. Score between 16-19:  high risk of alcohol related problems. Score 20 or above:  warrants further diagnostic evaluation for alcohol dependence and treatment.   CLINICAL FACTORS:   Severe Anxiety and/or Agitation Depression:   Hopelessness Dysthymia   Musculoskeletal: Strength & Muscle Tone: within normal limits Gait & Station: normal Patient leans: N/A  Psychiatric Specialty Exam: Physical Exam per HP  ROS per HP Constitutional: Negative.  HENT: Negative.  Eyes: Negative.  Respiratory: Negative.  Cardiovascular: Negative.  Gastrointestinal: Negative.  Musculoskeletal: Negative.  Skin: Negative.  Neurological: Negative.  Psychiatric/Behavioral: Positive for depression, vague suicidal ideas and memory loss. Negative for hallucinations and substance abuse. The patient is nervous/anxious and has insomnia.   Blood pressure 123/93, pulse 78, temperature 97.9 F (36.6 C), temperature source Oral, resp. rate 18, height 5\' 10"  (1.778 m), weight 71.215 kg (157 lb), SpO2 98 %.Body mass index is 22.53 kg/(m^2).  General Appearance: Neat  Eye Contact:  Fair  Speech:  Normal Rate  Volume:  Increased  Mood:  Anxious, Depressed and Irritable  Affect:  Depressed and Labile  Thought Process:  Goal Directed  Orientation:  Full (Time, Place, and Person)  Thought Content:  Obsessions and Rumination  Suicidal Thoughts:  Yes.  without intent/plan  Homicidal Thoughts:  No  Memory:  Immediate;   Fair Recent;   Poor Remote;   Fair  Judgement:  Impaired  Insight:  Lacking and Shallow  Psychomotor Activity:  Restlessness  Concentration:  Concentration: Poor  Recall:  Poor  Fund of Knowledge:  Fair  Language:  Fair  Akathisia:  No  Handed:  Right  AIMS (if indicated):     Assets:  Desire for Improvement Financial Resources/Insurance Housing Resilience Social Support  ADL's:  Intact  Cognition:  Impaired,  Mild  Sleep:  Number of Hours: 3      COGNITIVE  FEATURES THAT CONTRIBUTE TO RISK:  Closed-mindedness, Polarized thinking and Thought constriction (tunnel vision)    SUICIDE RISK:   Moderate:  Frequent suicidal ideation with limited intensity, and duration, some specificity in terms of plans, no associated intent, good self-control, limited dysphoria/symptomatology, some risk factors present, and identifiable protective factors, including available and accessible social support.  PLAN OF CARE: Admit to IP BH. Monitor. Treat  I certify that inpatient services furnished can reasonably be expected to improve the patient's condition.   Jim Like, MD 09/18/2015, 10:18 PM

## 2015-09-18 NOTE — Progress Notes (Signed)
Patient ID: Matthew Patrick, male   DOB: 1946-03-05, 70 y.o.   MRN: [id number] Per State regulations 482.30 this chart was reviewed for medical necessity with respect to the patient's admission/duration of stay.    Next review date: 09/21/15  Debarah Crape, BSN, RN-BC  Case Manager

## 2015-09-19 DIAGNOSIS — F333 Major depressive disorder, recurrent, severe with psychotic symptoms: Secondary | ICD-10-CM

## 2015-09-19 DIAGNOSIS — F411 Generalized anxiety disorder: Secondary | ICD-10-CM

## 2015-09-19 LAB — BASIC METABOLIC PANEL
ANION GAP: 8 (ref 5–15)
BUN: 14 mg/dL (ref 6–20)
CALCIUM: 9.5 mg/dL (ref 8.9–10.3)
CO2: 27 mmol/L (ref 22–32)
CREATININE: 0.86 mg/dL (ref 0.61–1.24)
Chloride: 89 mmol/L — ABNORMAL LOW (ref 101–111)
GLUCOSE: 111 mg/dL — AB (ref 65–99)
Potassium: 4.3 mmol/L (ref 3.5–5.1)
Sodium: 124 mmol/L — ABNORMAL LOW (ref 135–145)

## 2015-09-19 LAB — PROLACTIN: PROLACTIN: 18.6 ng/mL — AB (ref 4.0–15.2)

## 2015-09-19 MED ORDER — HALOPERIDOL LACTATE 5 MG/ML IJ SOLN: 5.0000 mg | Freq: Three times a day (TID) | INTRAMUSCULAR | Status: DC | PRN

## 2015-09-19 MED ORDER — HALOPERIDOL 2 MG PO TABS: 2.0000 mg | ORAL_TABLET | Freq: Three times a day (TID) | ORAL | Status: DC | PRN

## 2015-09-19 MED ORDER — ZOLPIDEM TARTRATE 5 MG PO TABS
5.0000 mg | ORAL_TABLET | Freq: Every evening | ORAL | Status: DC | PRN
  Administered 2015-09-19: 5 mg via ORAL
  Filled 2015-09-19: qty 1

## 2015-09-19 NOTE — BHH Group Notes (Signed)
[facility] Group Notes:  (Nursing/MHT/Case Management/Adjunct)  Date:  09/19/2015  Time:  1:21 AM  Type of Therapy:  Group Therapy  Participation Level:  Minimal  Participation Quality:  Resistant  Affect:  Depressed and Flat  Cognitive:  Alert  Insight:  Limited  Engagement in Group:  Resistant  Modes of Intervention:  n/a  Summary of Progress/Problems:  Matthew Patrick 09/19/2015, 1:21 AM

## 2015-09-19 NOTE — Progress Notes (Addendum)
[facility] MD Progress Note  09/19/2015 8:48 AM Mattson Dayal  MRN:  [id number]   This is a follow up of Brenda Cowher on 09/19/2015    Subjective:   Mr. Ritson states that he is not doing well today. Wilburn Mylar was a "very long day." Patient continues to be very anxious with catastrophic thinking, hopelessness and poor insight. He seems to have some small decrease in rumination but remains negative about his prognosis. He went outside with groups, ate meals "but I forced the food down", and visited with his wife yesterday. He feels that his fatigue today is 8/10. He endorses some paranoia surrounding people intending to hurt him or coming in his room while he is sleeping. Time was spent discussing CBT techniques and ways to manage his anxiety while awaiting ECT.   Sleep was poor, appetite poor  Per nursing: D: Patient is alert and oriented on the unit this shift. Patient attended and actively participated in groups today. Patient denies suicidal ideation, homicidal ideation, auditory or visual hallucinations at the present time.  A: Scheduled medications are administered to patient as per MD orders. Emotional support and encouragement are provided. Patient is maintained on q.15 minute safety checks. Patient is informed to notify staff with questions or concerns. R: No adverse medication reactions are noted. Patient is cooperative with medication administration and treatment plan today. Patient is Anxious and cooperative on the unit at this time. Patient interacts well with others on the unit this shift. Patient contracts for safety at this time. Patient remains safe at this time.    Principal Problem: Severe recurrent major depression with psychotic features [facility]) Diagnosis:   Patient Active Problem List   Diagnosis Date Noted  . Severe recurrent major depression without psychotic features ([place]) [F33.2] 09/17/2015  . Suicidal ideation [R45.851] 09/17/2015  . Cough [R05] 01/19/2015  . Restless leg  [G25.81] 01/19/2015  . COLD (chronic obstructive lung disease) ([place]) [J44.9] 05/22/2014  . Lung nodule < 6cm on CT [R91.1] 05/22/2014  . OSA (obstructive sleep apnea) [G47.33] 07/11/2013  . Lung nodule [R91.1] 05/11/2013  . Nocturnal hypoxemia [G47.34] 05/11/2013  . Obstructive lung disease ([place]) [J44.9] 04/17/2013  . Chronic cough [R05] 04/17/2013  . Syncope [R55] 03/25/2011  . Hypertension [I10] 03/25/2011  . Hypokalemia [E87.6] 03/25/2011  . Asthma [J45.909] 03/25/2011  . Sciatica [M54.30] 03/25/2011  . SOB (shortness of breath) [R06.02] 03/25/2011   Total Time spent with patient: 45 minutes  Past Psychiatric History: Patient reports he has had depressive problems essentially all of his life. He's been seeing his current psychiatrist for years. The only past medicines he can remember were trial of Prozac and Cymbalta. He thinks the highest Prozac dose he was ever on was 40 mg a day. According to the medicine reconciliation looks like he was on Abilify at some point but he doesn't remember it. No history of suicide attempts. No prior psychiatric hospitalizations. No reported manic symptoms or history of psychosis.  Past Medical History:  Past Medical History  Diagnosis Date  . Hypertension   . Hyperlipemia   . Anxiety   . Asthma     Past Surgical History  Procedure Laterality Date  . Neck skin tag biopsy    . 2d echocardiogram  03/27/11  . Tonsillectomy and adenoidectomy    . Cataract extraction     Family History:  Family History  Problem Relation Age of Onset  . Emphysema Mother   . Allergies Mother   . Heart disease Father  Family Psychiatric  History: denies. possible mood issue with father.  Social History:  History  Alcohol Use  . 4.2 oz/week  . 7 Glasses of wine per week    Comment: 2-4 glasses of wine per night     History  Drug Use No    Social History   Social History  . Marital Status: Significant Other    Spouse Name: N/A  . Number of Children:  N/A  . Years of Education: N/A   Occupational History  . retired    Social History Main Topics  . Smoking status: Former Smoker -- 1.00 packs/day for 5 years    Types: Cigarettes    Quit date: 04/25/1967  . Smokeless tobacco: None  . Alcohol Use: 4.2 oz/week    7 Glasses of wine per week     Comment: 2-4 glasses of wine per night  . Drug Use: No  . Sexual Activity: No   Other Topics Concern  . None   Social History Narrative   Additional Social History:                         Sleep: Poor  Appetite:  Poor  Current Medications: Current Facility-Administered Medications  Medication Dose Route Frequency Provider Last Rate Last Dose  . acetaminophen (TYLENOL) tablet 650 mg  650 mg Oral Q6H PRN Gonzella Lex, MD      . albuterol (PROVENTIL) (2.5 MG/3ML) 0.083% nebulizer solution 3 mL  3 mL Inhalation Q4H PRN Gonzella Lex, MD      . alum & mag hydroxide-simeth (MAALOX/MYLANTA) 200-200-20 MG/5ML suspension 30 mL  30 mL Oral Q4H PRN Gonzella Lex, MD      . bisacodyl (DULCOLAX) suppository 10 mg  10 mg Rectal Daily PRN  L , MD      . feeding supplement (ENSURE ENLIVE) (ENSURE ENLIVE) liquid 237 mL  237 mL Oral BID BM Gonzella Lex, MD   237 mL at 09/18/15 1400  . fluvoxaMINE (LUVOX) tablet 50 mg  50 mg Oral BID Gonzella Lex, MD   50 mg at 09/18/15 2117  . haloperidol (HALDOL) tablet 4 mg  4 mg Oral Q8H PRN Jim Like, MD   4 mg at 09/19/15 5366   Or  . haloperidol lactate (HALDOL) injection 5 mg  5 mg Intramuscular Q8H PRN  L , MD      . LORazepam (ATIVAN) tablet 0.5 mg  0.5 mg Oral QID Gonzella Lex, MD   0.5 mg at 09/18/15 2117  . magnesium hydroxide (MILK OF MAGNESIA) suspension 30 mL  30 mL Oral Daily PRN Gonzella Lex, MD   30 mL at 09/19/15 0639  . mirtazapine (REMERON) tablet 7.5 mg  7.5 mg Oral QHS  L , MD   7.5 mg at 09/18/15 2118  . polyethylene glycol (MIRALAX / GLYCOLAX) packet 17 g  17 g Oral BID Jim Like,  MD   17 g at 09/18/15 2115  . QUEtiapine (SEROQUEL) tablet 150 mg  150 mg Oral QHS Jim Like, MD   150 mg at 09/18/15 2119  . tiotropium (SPIRIVA) inhalation capsule 18 mcg  18 mcg Inhalation Daily Gonzella Lex, MD   18 mcg at 09/19/15 4403    Lab Results:  Results for orders placed or performed during the hospital encounter of 09/17/15 (from the past 48 hour(s))  Hemoglobin A1c     Status: None   Collection  Time: 09/17/15  2:45 PM  Result Value Ref Range   Hgb A1c MFr Bld 5.7 4.0 - 6.0 %  Lipid panel, fasting     Status: None   Collection Time: 09/17/15  2:45 PM  Result Value Ref Range   Cholesterol 173 0 - 200 mg/dL   Triglycerides 140 <150 mg/dL   HDL 58 >40 mg/dL   Total CHOL/HDL Ratio 3.0 RATIO   VLDL 28 0 - 40 mg/dL   LDL Cholesterol 87 0 - 99 mg/dL    Comment:        Total Cholesterol/HDL:CHD Risk Coronary Heart Disease Risk Table                     Men   Women  1/2 Average Risk   3.4   3.3  Average Risk       5.0   4.4  2 X Average Risk   9.6   7.1  3 X Average Risk  23.4   11.0        Use the calculated Patient Ratio above and the CHD Risk Table to determine the patient's CHD Risk.        ATP III CLASSIFICATION (LDL):  <100     mg/dL   Optimal  100-129  mg/dL   Near or Above                    Optimal  130-159  mg/dL   Borderline  160-189  mg/dL   High  >190     mg/dL   Very High   Prolactin     Status: Abnormal   Collection Time: 09/17/15  2:45 PM  Result Value Ref Range   Prolactin 18.6 (H) 4.0 - 15.2 ng/mL    Comment: (NOTE) Performed At: [facility] [place], [place] [id number] Lindon Romp MD [id number]   TSH     Status: None   Collection Time: 09/17/15  2:45 PM  Result Value Ref Range   TSH 1.019 0.350 - 4.500 uIU/mL  Basic metabolic panel     Status: Abnormal   Collection Time: 09/18/15  7:26 AM  Result Value Ref Range   Sodium 127 (L) 135 - 145 mmol/L   Potassium 4.0 3.5 - 5.1 mmol/L   Chloride 96 (L)  101 - 111 mmol/L   CO2 23 22 - 32 mmol/L   Glucose, Bld 100 (H) 65 - 99 mg/dL   BUN 9 6 - 20 mg/dL   Creatinine, Ser 0.77 0.61 - 1.24 mg/dL   Calcium 9.4 8.9 - 10.3 mg/dL   GFR calc non Af Amer >60 >60 mL/min   GFR calc Af Amer >60 >60 mL/min    Comment: (NOTE) The eGFR has been calculated using the CKD EPI equation. This calculation has not been validated in all clinical situations. eGFR's persistently <60 mL/min signify possible Chronic Kidney Disease.    Anion gap 8 5 - 15    Blood Alcohol level:  Lab Results  Component Value Date   ETH <5 09/17/2015    Physical Findings: AIMS: Facial and Oral Movements Muscles of Facial Expression: None, normal Lips and Perioral Area: None, normal Jaw: None, normal Tongue: None, normal,Extremity Movements Upper (arms, wrists, hands, fingers): None, normal Lower (legs, knees, ankles, toes): None, normal, Trunk Movements Neck, shoulders, hips: None, normal, Overall Severity Severity of abnormal movements (highest score from questions above): None, normal Incapacitation due to abnormal movements: None, normal Patient's  awareness of abnormal movements (rate only patient's report): No Awareness, Dental Status Current problems with teeth and/or dentures?: No Does patient usually wear dentures?: No  CIWA:  CIWA-Ar Total: 3 COWS:  COWS Total Score: 1  Musculoskeletal: Strength & Muscle Tone: within normal limits Gait & Station: normal Patient leans: N/A  Psychiatric Specialty Exam: Physical Exam  ROS  Blood pressure 161/80, pulse 79, temperature 97.9 F (36.6 C), temperature source Oral, resp. rate 18, height 5' 10" (1.778 m), weight 71.215 kg (157 lb), SpO2 98 %.Body mass index is 22.53 kg/(m^2).  General Appearance: Neat  Eye Contact:  Fair  Speech:  Normal Rate  Volume:  Normal  Mood:  Anxious, Depressed, Dysphoric, Hopeless, Irritable and Worthless  Affect:  Depressed, Labile and Tearful  Thought Process:  Goal Directed   Orientation:  Full (Time, Place, and Person)  Thought Content:  Obsessions, Paranoid Ideation and Rumination  Suicidal Thoughts:  Yes.  without intent/plan  Homicidal Thoughts:  No  Memory:  Immediate;   Fair Recent;   Fair Remote;   Fair  Judgement:  Impaired  Insight:  Shallow  Psychomotor Activity:  Restlessness  Concentration:  Concentration: Fair  Recall:  Poor  Fund of Knowledge:  Fair  Language:  Fair  Akathisia:  No  Handed:  Right  AIMS (if indicated):     Assets:  Desire for Improvement Financial Resources/Insurance Housing Intimacy Leisure Time Physical Health Resilience  ADL's:  Intact  Cognition:  Impaired,  Mild  Sleep:  Number of Hours: 3      Treatment Plan Summary: Daily contact with patient to assess and evaluate symptoms and progress in treatment and Medication management   70 year old male with depression and suicidal intent with no clear plan/intent present for ECT. Patient is aware that he will not get ECT until Wednesday. In the meanwhile he is having his medications adjusted to prepare. He complained of poor sleep, approx 3 hours, poor appetite, anhedonia, hopelessness, and other depressive symptoms. He has had hyponatremia which will be monitored. Patient prescribed Seroquel and Luvox to address sleep and anxiety along with decreased dose of Lorazepam in preparation for ECT. He is not happy to be admitted at this time.  Patient has very poor insight into his disorder and perseverates on the reasons why he does not want to be here.    PLAN:  Observation Level/Precautions: Elopement Continuous Observation 15 minute checks  Suicide precautions  Laboratory: as ordered, NA was low and rechecked. Will continue to monitor and consult medicine if it remains low.  - consulted medicine 5/28 -reorderd BMP 5/28 EKG CKR - in preparation for ECT   Psychotherapy: Per milieu, groups  Medications: Luvox 50 mg PO BID Lorazepam decreased (cut in half  yesterday) Seroquel will be increased 150 mg PO QHS Ambien 5 mg QHS prn for sleep - 2 doses available Bowel Regimen- milk of Mag, miralax, Dulcolax   Consultations:   Discharge Concerns:   Estimated LOS:5-7  Other:           Jim Like, MD 09/19/2015, 8:48 AM

## 2015-09-19 NOTE — BHH Counselor (Signed)
Adult Comprehensive Assessment  Patient ID: Matthew Patrick, male   DOB: 29-Mar-1946, 70 y.o.   MRN: [id number]  Information Source: Information source: Patient  Current Stressors:  Educational / Learning stressors: None reported  Employment / Job issues: Retired  Family Relationships: Pt reports strained family relationships due to his depression  Museum/gallery curator / Lack of resources (include bankruptcy): Pt reports increased debt.  Housing / Lack of housing: None reported  Physical health (include injuries & life threatening diseases): None reported  Social relationships: None reported  Substance abuse: Denies use  Bereavement / Loss: Pt's mother died recently.   Living/Environment/Situation:  Living Arrangements: Spouse/significant other Living conditions (as described by patient or guardian): Pt lives with wife.  How long has patient lived in current situation?: 4 years What is atmosphere in current home: Supportive, Loving  Family History:  Marital status: Married Number of Years Married: 4 What types of issues is patient dealing with in the relationship?: "My negativity"  Are you sexually active?: Yes What is your sexual orientation?: Heterosexual  Has your sexual activity been affected by drugs, alcohol, medication, or emotional stress?: NA Does patient have children?: Yes How many children?: 2 How is patient's relationship with their children?: 2 daughters; strained due to his depression.   Childhood History:  By whom was/is the patient raised?: Both parents Description of patient's relationship with caregiver when they were a child: Good relatioship with mother, father was "absent" Patient's description of current relationship with people who raised him/her: Parents have passed away.  How were you disciplined when you got in trouble as a child/adolescent?: NA Did patient suffer any verbal/emotional/physical/sexual abuse as a child?: No Did patient suffer from severe childhood  neglect?: No Has patient ever been sexually abused/assaulted/raped as an adolescent or adult?: No Was the patient ever a victim of a crime or a disaster?: No Witnessed domestic violence?: No Has patient been effected by domestic violence as an adult?: No  Education:  Highest grade of school patient has completed: BA Degree Currently a student?: No Learning disability?: No  Employment/Work Situation:   Employment situation:  (Retired ) Patient's job has been impacted by current illness: No What is the longest time patient has a held a job?: 23 years  Where was the patient employed at that time?: [place] Has patient ever been in the [employer]?: No  Financial Resources:   Museum/gallery curator resources: [employer] (Retirement ) Does patient have a Programmer, applications or guardian?: No  Alcohol/Substance Abuse:   What has been your use of drugs/alcohol within the last 12 months?: None reported  Alcohol/Substance Abuse Treatment Hx: Denies past history Has alcohol/substance abuse ever caused legal problems?: No  Social Support System:   Heritage manager System: Manufacturing engineer System: family, outpatient provider  Type of faith/religion: Christainity  How does patient's faith help to cope with current illness?: Prayer   Leisure/Recreation:   Leisure and Hobbies: "I dont know anymore"   Strengths/Needs:   What things does the patient do well?: "I dont know"  In what areas does patient struggle / problems for patient: depression, anxiety, racing thoughts, excessive worrying.    Discharge Plan:   Does patient have access to transportation?: Yes Will patient be returning to same living situation after discharge?: Yes Currently receiving community mental health services: Yes (From Whom) (Dr Robina Ade, Marya Amsler ) Does patient have financial barriers related to discharge medications?: No  Summary/Recommendations:    Patient is a 70 year old male admitted  with a  diagnosis of Major Depression. Patient presented to the hospital with depression. Patient reports primary triggers for admission were loss of mother and financial stressors. Patient will benefit from crisis stabilization, medication evaluation, group therapy and psycho education in addition to case management for discharge. At discharge, it is recommended that patient remain compliant with established discharge plan and continued treatment.   [place]. MSW, [facility]  09/19/2015

## 2015-09-19 NOTE — Progress Notes (Signed)
D:  Per pt self inventory pt reports sleeping poor, appetite poor, energy level low, ability to pay attention poor, rates depression at a 10 out of 10, hopelessness at a 10 out of 10, anxiety at a 10 out of 10, denies SI/HI/AVH, goal today: "shower, attend sessions", flat depressed and anxious during interaction.     A:  Emotional support provided, Encouraged pt to continue with treatment plan and attend all group activities, q15 min checks maintained for safety.  R:  Pt is receptive, going to groups, pleasant and cooperative with staff and other patients on the unit.

## 2015-09-19 NOTE — BHH Group Notes (Signed)
[place] Group Notes:  (Nursing/MHT/Case Management/Adjunct)  Date:  09/19/2015  Time:  11:34 PM  Type of Therapy:  Group Therapy  Participation Level:  Active  Participation Quality:  Appropriate and Attentive  Affect:  Appropriate  Cognitive:  Alert and Appropriate  Insight:  Good  Engagement in Group:  Engaged  Modes of Intervention:  Discussion  Summary of Progress/Problems:  Matthew Patrick 09/19/2015, 11:34 PM

## 2015-09-19 NOTE — Plan of Care (Signed)
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

## 2015-09-19 NOTE — BHH Suicide Risk Assessment (Signed)
[place] INPATIENT:  Family/Significant Other Suicide Prevention Education  Suicide Prevention Education:  Education Completed;Lynn Rebolledo (wife) has been identified by the patient as the family member/significant other with whom the patient will be residing, and identified as the person(s) who will aid the patient in the event of a mental health crisis (suicidal ideations/suicide attempt).  With written consent from the patient, the family member/significant other has been provided the following suicide prevention education, prior to the and/or following the discharge of the patient.  The suicide prevention education provided includes the following:  Suicide risk factors  Suicide prevention and interventions  National Suicide Hotline telephone number  [facility] assessment telephone number  [facility] Emergency Assistance [place] and/or Residential Mobile Crisis Unit telephone number  Request made of family/significant other to:  Remove weapons (e.g., guns, rifles, knives), all items previously/currently identified as safety concern.    Remove drugs/medications (over-the-counter, prescriptions, illicit drugs), all items previously/currently identified as a safety concern.  The family member/significant other verbalizes understanding of the suicide prevention education information provided.  The family member/significant other agrees to remove the items of safety concern listed above.  [place] MSW, [place]  09/19/2015, 3:06 PM

## 2015-09-19 NOTE — BHH Group Notes (Signed)
[place] LCSW Group Therapy  09/19/2015 2:09 PM  Type of Therapy:  Group Therapy  Participation Level:  Minimal  Participation Quality:  Attentive  Affect:  Flat  Cognitive:  Alert  Insight:  Improving  Engagement in Therapy:  Improving  Modes of Intervention:  Education, Socialization and Support Activity  Summary of Progress/Problems: Pt will identify unhealthy thoughts and how they impact their emotions and behavior. Pt will be encouraged to discuss these thoughts, emotions and behaviors with the group.  Junior attended group and stayed the entire time. He sat quietly and listened to other group members share. He participated in the activity.   [place] MSW, [place]  09/19/2015, 2:09 PM

## 2015-09-19 NOTE — Progress Notes (Signed)
D: Patient is alert and oriented on the unit this shift. Patient attended and actively participated in groups today. Patient denies suicidal ideation, homicidal ideation, auditory or visual hallucinations at the present time.  A: Scheduled medications are administered to patient as per MD orders. Emotional support and encouragement are provided. Patient is maintained on q.15 minute safety checks. Patient is informed to notify staff with questions or concerns. R: No adverse medication reactions are noted. Patient is cooperative with medication administration and treatment plan today. Patient is Anxious and cooperative on the unit at this time. Patient interacts well with others on the unit this shift. Patient contracts for safety at this time. Patient remains safe at this time.

## 2015-09-20 ENCOUNTER — Inpatient Hospital Stay
Admission: AD | Admit: 2015-09-20 | Discharge: 2015-09-22 | DRG: 644 | Disposition: A | Discharge status: Other Acute Inpatient Hospital | Payer: Medicare Other | Source: Ambulatory Visit | Attending: Internal Medicine | Admitting: Internal Medicine

## 2015-09-20 DIAGNOSIS — F411 Generalized anxiety disorder: Secondary | ICD-10-CM | POA: Diagnosis present

## 2015-09-20 DIAGNOSIS — H269 Unspecified cataract: Secondary | ICD-10-CM | POA: Diagnosis present

## 2015-09-20 DIAGNOSIS — K5900 Constipation, unspecified: Secondary | ICD-10-CM | POA: Diagnosis present

## 2015-09-20 DIAGNOSIS — Z7982 Long term (current) use of aspirin: Secondary | ICD-10-CM

## 2015-09-20 DIAGNOSIS — E871 Hypo-osmolality and hyponatremia: Secondary | ICD-10-CM | POA: Diagnosis not present

## 2015-09-20 DIAGNOSIS — E222 Syndrome of inappropriate secretion of antidiuretic hormone: Secondary | ICD-10-CM | POA: Diagnosis present

## 2015-09-20 DIAGNOSIS — F32A Depression, unspecified: Secondary | ICD-10-CM

## 2015-09-20 DIAGNOSIS — I10 Essential (primary) hypertension: Secondary | ICD-10-CM | POA: Diagnosis present

## 2015-09-20 DIAGNOSIS — D649 Anemia, unspecified: Secondary | ICD-10-CM

## 2015-09-20 DIAGNOSIS — F339 Major depressive disorder, recurrent, unspecified: Secondary | ICD-10-CM | POA: Diagnosis present

## 2015-09-20 DIAGNOSIS — Z79899 Other long term (current) drug therapy: Secondary | ICD-10-CM | POA: Diagnosis not present

## 2015-09-20 DIAGNOSIS — R531 Weakness: Secondary | ICD-10-CM

## 2015-09-20 DIAGNOSIS — G4733 Obstructive sleep apnea (adult) (pediatric): Secondary | ICD-10-CM | POA: Diagnosis present

## 2015-09-20 DIAGNOSIS — E785 Hyperlipidemia, unspecified: Secondary | ICD-10-CM | POA: Diagnosis present

## 2015-09-20 DIAGNOSIS — F329 Major depressive disorder, single episode, unspecified: Secondary | ICD-10-CM

## 2015-09-20 DIAGNOSIS — J449 Chronic obstructive pulmonary disease, unspecified: Secondary | ICD-10-CM | POA: Diagnosis present

## 2015-09-20 HISTORY — DX: Depression, unspecified: F32.A

## 2015-09-20 HISTORY — DX: Major depressive disorder, single episode, unspecified: F32.9

## 2015-09-20 LAB — CREATININE, SERUM
Creatinine, Ser: 0.83 mg/dL (ref 0.61–1.24)
Creatinine, Ser: 0.76 mg/dL (ref 0.61–1.24)
GFR calc non Af Amer: >60 mL/min (ref >60)

## 2015-09-20 LAB — CBC
HEMATOCRIT: 38.5 % — AB (ref 40.0–52.0)
HEMATOCRIT: 39 % — AB (ref 40.0–52.0)
HEMOGLOBIN: 13.9 g/dL (ref 13.0–18.0)
Hemoglobin: 13.7 g/dL (ref 13.0–18.0)
MCH: 32.1 pg (ref 26.0–34.0)
MCH: 31.8 pg (ref 26.0–34.0)
MCHC: 35.5 g/dL (ref 32.0–36.0)
MCHC: 35.6 g/dL (ref 32.0–36.0)
MCV: 90.3 fL (ref 80.0–100.0)
MCV: 89.3 fL (ref 80.0–100.0)
Platelets: 272 10*3/uL (ref 150–440)
Platelets: 286 10*3/uL (ref 150–440)
RBC: 4.27 MIL/uL — AB (ref 4.40–5.90)
RBC: 4.36 MIL/uL — ABNORMAL LOW (ref 4.40–5.90)
RDW: 12 % (ref 11.5–14.5)
RDW: 12 % (ref 11.5–14.5)
WBC: 8.7 10*3/uL (ref 3.8–10.6)
WBC: 9.4 10*3/uL (ref 3.8–10.6)

## 2015-09-20 LAB — SODIUM
SODIUM: 123 mmol/L — AB (ref 135–145)
Sodium: 123 mmol/L — ABNORMAL LOW (ref 135–145)

## 2015-09-20 MED ORDER — TIOTROPIUM BROMIDE MONOHYDRATE 18 MCG IN CAPS
18.0000 ug | ORAL_CAPSULE | Freq: Every day | RESPIRATORY_TRACT | Status: DC
  Administered 2015-09-21 – 2015-09-22 (×2): 18 ug via RESPIRATORY_TRACT
  Filled 2015-09-20: qty 5

## 2015-09-20 MED ORDER — ENOXAPARIN SODIUM 40 MG/0.4ML ~~LOC~~ SOLN
40.0000 mg | SUBCUTANEOUS | Status: DC
  Administered 2015-09-20: 40 mg via SUBCUTANEOUS
  Filled 2015-09-20: qty 0.4

## 2015-09-20 MED ORDER — SENNOSIDES-DOCUSATE SODIUM 8.6-50 MG PO TABS: 1.0000 | ORAL_TABLET | Freq: Every evening | ORAL | Status: DC | PRN

## 2015-09-20 MED ORDER — HALOPERIDOL LACTATE 5 MG/ML IJ SOLN
5.0000 mg | Freq: Three times a day (TID) | INTRAMUSCULAR | Status: DC | PRN
  Filled 2015-09-20: qty 1

## 2015-09-20 MED ORDER — FLUVOXAMINE MALEATE 50 MG PO TABS
50.0000 mg | ORAL_TABLET | Freq: Two times a day (BID) | ORAL | Status: DC
  Administered 2015-09-20 – 2015-09-21 (×2): 50 mg via ORAL
  Filled 2015-09-20 (×4): qty 1

## 2015-09-20 MED ORDER — QUETIAPINE FUMARATE 25 MG PO TABS
150.0000 mg | ORAL_TABLET | Freq: Every day | ORAL | Status: DC
  Administered 2015-09-20 – 2015-09-21 (×2): 150 mg via ORAL
  Filled 2015-09-20 (×2): qty 2

## 2015-09-20 MED ORDER — LORAZEPAM 0.5 MG PO TABS: 0.2500 mg | ORAL_TABLET | Freq: Four times a day (QID) | ORAL | Status: DC

## 2015-09-20 MED ORDER — ACETAMINOPHEN 325 MG PO TABS: 650.0000 mg | ORAL_TABLET | Freq: Four times a day (QID) | ORAL | Status: DC | PRN

## 2015-09-20 MED ORDER — POLYETHYLENE GLYCOL 3350 17 G PO PACK: 17.0000 g | PACK | Freq: Every day | ORAL | Status: DC

## 2015-09-20 MED ORDER — ONDANSETRON HCL 4 MG/2ML IJ SOLN: 4.0000 mg | Freq: Four times a day (QID) | INTRAMUSCULAR | Status: DC | PRN

## 2015-09-20 MED ORDER — ACETAMINOPHEN 650 MG RE SUPP: 650.0000 mg | Freq: Four times a day (QID) | RECTAL | Status: DC | PRN

## 2015-09-20 MED ORDER — SODIUM CHLORIDE 0.9 % IV SOLN
INTRAVENOUS | Status: DC
  Administered 2015-09-20 – 2015-09-21 (×2): via INTRAVENOUS

## 2015-09-20 MED ORDER — DOCUSATE SODIUM 100 MG PO CAPS: 100.0000 mg | ORAL_CAPSULE | Freq: Two times a day (BID) | ORAL | Status: DC

## 2015-09-20 MED ORDER — HALOPERIDOL 2 MG PO TABS
2.0000 mg | ORAL_TABLET | Freq: Three times a day (TID) | ORAL | Status: DC | PRN
  Administered 2015-09-21: 2 mg via ORAL
  Filled 2015-09-20 (×3): qty 1

## 2015-09-20 MED ORDER — ONDANSETRON HCL 4 MG PO TABS: 4.0000 mg | ORAL_TABLET | Freq: Four times a day (QID) | ORAL | Status: DC | PRN

## 2015-09-20 MED ORDER — LORAZEPAM 0.5 MG PO TABS
0.2500 mg | ORAL_TABLET | Freq: Four times a day (QID) | ORAL | Status: DC
  Administered 2015-09-20 – 2015-09-22 (×8): 0.25 mg via ORAL
  Filled 2015-09-20 (×9): qty 1

## 2015-09-20 NOTE — Care Management Note (Signed)
Case Management Note  Patient Details  Name: Matthew Patrick MRN: [id number] Date of Birth: 1945/10/18  Subjective/Objective:      70yo Matthew Patrick was admitted for ECT treatments for severe depression. However, he is currently admitted to the medical floor per hyponatremia. After his hyponatremia is corrected, he will return to [facility] to begin ECT. Lives at home with his wife. Was admitted under IVC, uncertain as to whether the IVC has been rescended.  Today has been ambulating around the nurses station accompanied by his 1:1 sitter.  Case management will follow for any discharge needs.              Action/Plan:   Expected Discharge Date:                  Expected Discharge Plan:     In-House Referral:     Discharge planning Services     Post Acute Care Choice:    Choice offered to:     DME Arranged:    DME Agency:     HH Arranged:    [place] Agency:     Status of Service:     Medicare Important Message Given:    Date Medicare IM Given:    Medicare IM give by:    Date Additional Medicare IM Given:    Additional Medicare Important Message give by:     If discussed at [place] of Stay Meetings, dates discussed:    Additional Comments:  Matthew Ground A, RN 09/20/2015, 3:29 PM

## 2015-09-20 NOTE — Progress Notes (Signed)
Spoke with Dr Gloriann Loan (psyc) and confirmed medications pt was on in the behavioral unit.  Spoke with Dr Benjie Karvonen. Ok to order medications

## 2015-09-20 NOTE — H&P (Signed)
[facility] [place] NAME: Matthew Patrick    MR#:  [id number]  DATE OF BIRTH:  Dec 19, 1945  DATE OF ADMISSION:  09/20/2015  PRIMARY CARE PHYSICIAN: Tivis Ringer, MD   REQUESTING/REFERRING PHYSICIAN Dr bell from psychiatry unit  CHIEF COMPLAINT:   Hyponatremia HISTORY OF PRESENT ILLNESS:  Matthew Patrick  is a 70 y.o. male with a known history of Severe depression requiring ECT and anxiety who who has been in the psychiatry unit for the past few days. Sodium level has been decreasing and is 124 today so hospital service was consulted for further evaluation. Shortness severe depression and anxiety and reports that he has been weak over the past 2 days as well as constipated. He has dysphoric mood and decreased by mouth intake. He does not drink a lot of water. He denies dry mouth, weight gain or dry skin.  PAST MEDICAL HISTORY:   Past Medical History  Diagnosis Date  . Hypertension   . Hyperlipemia   . Anxiety   . Asthma     PAST SURGICAL HISTORY:   Past Surgical History  Procedure Laterality Date  . Neck skin tag biopsy    . 2d echocardiogram  03/27/11  . Tonsillectomy and adenoidectomy    . Cataract extraction      SOCIAL HISTORY:   Social History  Substance Use Topics  . Smoking status: Former Smoker -- 1.00 packs/day for 5 years    Types: Cigarettes    Quit date: 04/25/1967  . Smokeless tobacco: Not on file  . Alcohol Use: 4.2 oz/week    7 Glasses of wine per week     Comment: 2-4 glasses of wine per night    FAMILY HISTORY:   Family History  Problem Relation Age of Onset  . Emphysema Mother   . Allergies Mother   . Heart disease Father     DRUG ALLERGIES:  No Known Allergies  REVIEW OF SYSTEMS:   Review of Systems  Constitutional: Negative for fever, chills and malaise/fatigue.  HENT: Negative for ear discharge, ear pain, hearing loss, nosebleeds and sore throat.   Eyes: Negative for blurred vision and pain.   Respiratory: Negative for cough, hemoptysis, shortness of breath and wheezing.   Cardiovascular: Negative for chest pain, palpitations and leg swelling.  Gastrointestinal: Positive for constipation. Negative for nausea, vomiting, abdominal pain, diarrhea and blood in stool.  Genitourinary: Negative for dysuria.  Musculoskeletal: Negative for back pain.  Neurological: Positive for weakness. Negative for dizziness, tremors, speech change, focal weakness, seizures and headaches.  Endo/Heme/Allergies: Does not bruise/bleed easily.  Psychiatric/Behavioral: Positive for depression. Negative for suicidal ideas and hallucinations. The patient is nervous/anxious.     MEDICATIONS AT HOME:   Prior to Admission medications   Medication Sig Start Date End Date Taking? Authorizing Provider  ARIPiprazole (ABILIFY) 2 MG tablet Take 2 mg by mouth daily.   Yes Historical Provider, MD  aspirin 81 MG tablet Take 81 mg by mouth daily.   Yes Historical Provider, MD  DULoxetine (CYMBALTA) 60 MG capsule Take 60 mg by mouth daily.   Yes Historical Provider, MD  fluticasone (FLONASE) 50 MCG/ACT nasal spray Place 2 sprays into both nostrils daily.   Yes Historical Provider, MD  gabapentin (NEURONTIN) 100 MG capsule TAKE 2 CAPSULES AT BEDTIME 07/26/15  Yes Brand Males, MD  ibuprofen (ADVIL,MOTRIN) 600 MG tablet Take 600 mg by mouth every 6 (six) hours as needed. For headache    Yes Historical Provider,  MD  LORazepam (ATIVAN) 1 MG tablet Take 1 mg by mouth 2 (two) times daily.    Yes Historical Provider, MD  mirtazapine (REMERON) 15 MG tablet Take 15 mg by mouth at bedtime.    Yes Historical Provider, MD  SPIRIVA HANDIHALER 18 MCG inhalation capsule PLACE 1 CAPSULE (18 MCG TOTAL) INTO INHALER AND INHALE DAILY. 05/25/15  Yes Brand Males, MD  phenylephrine (SUDAFED PE) 10 MG TABS tablet Take 10 mg by mouth every 4 (four) hours as needed.    Historical Provider, MD  PROAIR HFA 108 (90 Base) MCG/ACT inhaler INHALE 2  PUFFS INTO THE LUNGS EVERY 6 HOURS AS NEEDED FOR WHEEZING OR SHORTNESS OF BREATH 08/02/15   Brand Males, MD      VITAL SIGNS:  There were no vitals taken for this visit.  PHYSICAL EXAMINATION:   Physical Exam  Constitutional: He is oriented to person, place, and time and well-developed, well-nourished, and in no distress. No distress.  HENT:  Head: Normocephalic.  Eyes: No scleral icterus.  Neck: Normal range of motion. Neck supple. No JVD present. No tracheal deviation present.  Cardiovascular: Normal rate, regular rhythm and normal heart sounds.  Exam reveals no gallop and no friction rub.   No murmur heard. Pulmonary/Chest: Effort normal and breath sounds normal. No respiratory distress. He has no wheezes. He has no rales. He exhibits no tenderness.  Abdominal: Soft. Bowel sounds are normal. He exhibits no distension and no mass. There is no tenderness. There is no rebound and no guarding.  Musculoskeletal: Normal range of motion. He exhibits no edema.  Neurological: He is alert and oriented to person, place, and time.  Skin: Skin is warm. No rash noted. No erythema.  Psychiatric: Affect and judgment normal.  Depressed affect      LABORATORY PANEL:   CBC  Recent Labs Lab 09/20/15 1131  WBC 8.7  HGB 13.7  HCT 38.5*  PLT 272   ------------------------------------------------------------------------------------------------------------------  Chemistries   Recent Labs Lab 09/17/15 1445  09/19/15 1620 09/20/15 1131  NA 126*  < > 124* 123*  K 4.1  < > 4.3  --   CL 93*  < > 89*  --   CO2 25  < > 27  --   GLUCOSE 101*  < > 111*  --   BUN 9  < > 14  --   CREATININE 0.92  < > 0.86 0.83  CALCIUM 9.4  < > 9.5  --   AST 26  --   --   --   ALT 25  --   --   --   ALKPHOS 71  --   --   --   BILITOT 0.5  --   --   --   < > = values in this interval not  displayed. ------------------------------------------------------------------------------------------------------------------  Cardiac Enzymes No results for input(s): TROPONINI in the last 168 hours. ------------------------------------------------------------------------------------------------------------------  RADIOLOGY:  No results found.  EKG:     IMPRESSION AND PLAN:   70 year old male admitted initially to behavioral health for depression and possible ECT who has hyponatremia.  1. Severe hyponatremia: Continue sodium checks every 6 hours. Nephrology consultation. Order serum osmolality, TSH, cortisol and uric acid level. Start IV fluids with repeat sodium level within 6 hours.  2. Depression and anxiety: Patient will continue to require psychiatry consultation and sitter at bedside. Continue Luvox and Seroquel 3. OSA: Patient has CPAP machine which he will continue to require.   4. Constipation: We'll provide stool  softeners and order KUB. All the records are reviewed and case discussed with ED provider. Management plans discussed with the patient and he in agreement  CODE STATUS: FULL  CRITICAL CARE TOTAL TIME TAKING CARE OF THIS PATIENT: 50 minutes.   Due to low sodium and  Decreasing  Rin Gorton M.D on 09/20/2015 at 2:47 PM  Between 7am to 6pm - Pager - [phone]  After 6pm go to [email or web address] - password [email or web address] [facility]  Office  [phone]  CC: Primary care physician; Tivis Ringer, MD

## 2015-09-20 NOTE — Progress Notes (Signed)
Per patient request, writer Attempted to contact pt's spouse and notify her of patient being admitted to inpatient on Medical floor, no answer, voicemail left for her to call back.

## 2015-09-20 NOTE — Progress Notes (Signed)
Pt transported to rm 154, report was given to [employer], pt's wife was notified that patient was moved.

## 2015-09-20 NOTE — Progress Notes (Signed)
Recreation Therapy Notes  Date: 05.29.17 Time: 11:00 am Location: Craft Room  Group Topic: Self-expression  Goal Area(s) Addresses:  Patient will identify one color per emotion listed on wheel. Patient will verbalize benefit of using art as a means of self-expression. Patient will verbalize one emotion experienced during session. Patient will be educated on other forms of self-expression.  Behavioral Response: Did not attend  Intervention: Emotion Wheel  Activity: Patients were given an Emotion Wheel worksheet and instructed to pick a color for each emotion listed.   Education: LRT educated patients on other forms of self-expression.  Education Outcome: Patient did not attend group.  Clinical Observations/Feedback: Patient did not attend group.  Leonette Monarch, LRT/CTRS 09/20/2015 11:52 AM

## 2015-09-20 NOTE — Discharge Summary (Signed)
Physician Discharge Summary Note  Patient:  Matthew Patrick is an 70 y.o., male MRN:  [id number] DOB:  1946/03/24 Patient phone:  [phone] (home)  Patient address:   [place] [address],  Total Time spent with patient: 45 minutes  Date of Admission:  09/17/2015 Date of Discharge: 09/20/15   Reason for Admission:  Depression, Anxiety, Admitted for ECT   Principal Problem: Severe recurrent major depression with psychotic features [facility]) Discharge Diagnoses: Patient Active Problem List   Diagnosis Date Noted  . Hyponatremia [E87.1] 09/20/2015  . Severe recurrent major depression with psychotic features ([place]) [F33.3] 09/19/2015  . Severe recurrent major depression without psychotic features ([place]) [F33.2] 09/17/2015  . Suicidal ideation [R45.851] 09/17/2015  . Cough [R05] 01/19/2015  . Restless leg [G25.81] 01/19/2015  . COLD (chronic obstructive lung disease) ([place]) [J44.9] 05/22/2014  . Lung nodule < 6cm on CT [R91.1] 05/22/2014  . OSA (obstructive sleep apnea) [G47.33] 07/11/2013  . Lung nodule [R91.1] 05/11/2013  . Nocturnal hypoxemia [G47.34] 05/11/2013  . Obstructive lung disease ([place]) [J44.9] 04/17/2013  . Chronic cough [R05] 04/17/2013  . Syncope [R55] 03/25/2011  . Hypertension [I10] 03/25/2011  . Hypokalemia [E87.6] 03/25/2011  . Asthma [J45.909] 03/25/2011  . Sciatica [M54.30] 03/25/2011  . SOB (shortness of breath) [R06.02] 03/25/2011    Past Psychiatric History: anxiety and depression  Past Medical History:  Past Medical History  Diagnosis Date  . Hypertension   . Hyperlipemia   . Anxiety   . Asthma   . Depression     Past Surgical History  Procedure Laterality Date  . Neck skin tag biopsy    . 2d echocardiogram  03/27/11  . Tonsillectomy and adenoidectomy    . Cataract extraction     Family History:  Family History  Problem Relation Age of Onset  . Emphysema Mother   . Allergies Mother   . Heart disease Father    Family  Psychiatric  History: denies Social History:  History  Alcohol Use  . 4.2 oz/week  . 7 Glasses of wine per week    Comment: 2-4 glasses of wine per night, none in 3 weeks     History  Drug Use No    Social History   Social History  . Marital Status: Significant Other    Spouse Name: N/A  . Number of Children: N/A  . Years of Education: N/A   Occupational History  . retired    Social History Main Topics  . Smoking status: Former Smoker -- 1.00 packs/day for 5 years    Types: Cigarettes    Quit date: 04/25/1967  . Smokeless tobacco: None  . Alcohol Use: 4.2 oz/week    7 Glasses of wine per week     Comment: 2-4 glasses of wine per night, none in 3 weeks  . Drug Use: No  . Sexual Activity: No   Other Topics Concern  . None   Social History Narrative    Hospital Course:  Patient remained anxious and depressed throughout his hospital stay.    He required frequent reassurance and redirection regarding his medical plan.  Patient continued to have hyponatremia and was transferred to medicine service when his sodium dropped to 124.  The patient denied any physical complaints including but not limited to changes in vision, dizziness, passing out.  He continued to ruminate on his illness and expressed that he was hopeless about his future. Patient seemed to have trouble recalling the medical plan even immediately  after discussing it. He had issues with constipation and was given milk of magnesia and Dulcolax but did not want "anything stronger" because he feared abdominal cramps.  Patient continues to desire to ECT and remains under involuntary commitment while transferred to medicine service.  Would suggest continued suicide sitter and suicide precautions though he denies any plan or intent on committing suicide. Patient is very hopeless and remains at risk.   Physical Findings: AIMS: Facial and Oral Movements Muscles of Facial Expression: None, normal Lips and Perioral Area: None,  normal Jaw: None, normal Tongue: None, normal,Extremity Movements Upper (arms, wrists, hands, fingers): None, normal Lower (legs, knees, ankles, toes): None, normal, Trunk Movements Neck, shoulders, hips: None, normal, Overall Severity Severity of abnormal movements (highest score from questions above): None, normal Incapacitation due to abnormal movements: None, normal Patient's awareness of abnormal movements (rate only patient's report): No Awareness, Dental Status Current problems with teeth and/or dentures?: No Does patient usually wear dentures?: No  CIWA:  CIWA-Ar Total: 3 COWS:  COWS Total Score: 1  Musculoskeletal: Strength & Muscle Tone: within normal limits Gait & Station: normal Patient leans: N/A  Psychiatric Specialty Exam: Physical Exam  ROS  Blood pressure 162/84, pulse 89, temperature 97.8 F (36.6 C), temperature source Oral, resp. rate 18, height 5\' 10"  (1.778 m), weight 71.215 kg (157 lb), SpO2 97 %.Body mass index is 22.53 kg/(m^2).  General Appearance: Casual  Eye Contact:  Fair  Speech:  Clear and Coherent  Volume:  Normal  Mood:  Anxious, Depressed, Dysphoric, Hopeless, Irritable and Worthless  Affect:  Depressed and anxious  Thought Process:  Goal Directed  Orientation:  Full (Time, Place, and Person)  Thought Content:  Obsessions, Paranoid Ideation and Rumination  Suicidal Thoughts:  No  Homicidal Thoughts:  No  Memory:  Immediate;   Fair Recent;   Fair Remote;   Fair  Judgement:  Poor  Insight:  Lacking  Psychomotor Activity:  Restlessness  Concentration:  Concentration: Poor and Attention Span: Poor  Recall:  Poor  Fund of Knowledge:  Fair  Language:  Fair  Akathisia:  No  Handed:    AIMS (if indicated):     Assets:  Financial Resources/Insurance Housing Intimacy Physical Health  ADL's:  Intact  Cognition:  Impaired,  Mild  Sleep:  Number of Hours: 6     Have you used any form of tobacco in the last 30 days? (Cigarettes, Smokeless  Tobacco, Cigars, and/or Pipes): No  Has this patient used any form of tobacco in the last 30 days? (Cigarettes, Smokeless Tobacco, Cigars, and/or Pipes) Yes, N/A  Blood Alcohol level:  Lab Results  Component Value Date   ETH <5 [id number]    Metabolic Disorder Labs:  Lab Results  Component Value Date   HGBA1C 5.7 09/17/2015   MPG 117* 03/26/2011   Lab Results  Component Value Date   PROLACTIN 18.6* 09/17/2015   Lab Results  Component Value Date   CHOL 173 09/17/2015   TRIG 140 09/17/2015   HDL 58 09/17/2015   CHOLHDL 3.0 09/17/2015   VLDL 28 09/17/2015   LDLCALC 87 09/17/2015   LDLCALC 43 03/26/2011   LDLCALC 46 03/26/2011    See Psychiatric Specialty Exam and Suicide Risk Assessment completed by Attending Physician prior to discharge.  Discharge destination:  Other:  Medical Floor  Is patient on multiple antipsychotic therapies at discharge:  No   Has Patient had three or more failed trials of antipsychotic monotherapy by history:  No  Recommended  Plan for Multiple Antipsychotic Therapies: NA     Medication List    STOP taking these medications        ABILIFY 2 MG tablet  Generic drug:  ARIPiprazole     aspirin 81 MG tablet     DULoxetine 60 MG capsule  Commonly known as:  CYMBALTA     fluticasone 50 MCG/ACT nasal spray  Commonly known as:  FLONASE     gabapentin 100 MG capsule  Commonly known as:  NEURONTIN     ibuprofen 600 MG tablet  Commonly known as:  ADVIL,MOTRIN     LORazepam 1 MG tablet  Commonly known as:  ATIVAN     mirtazapine 15 MG tablet  Commonly known as:  REMERON     phenylephrine 10 MG Tabs tablet  Commonly known as:  SUDAFED PE     PROAIR HFA 108 (90 Base) MCG/ACT inhaler  Generic drug:  albuterol     SPIRIVA HANDIHALER 18 MCG inhalation capsule  Generic drug:  tiotropium         Follow-up recommendations:  Activity:  Patient is IVC'd  Other:  ECT  Comments:  Patient should be readmitted to [facility] after being  medically cleared.  Signed: Jim Like, MD 09/20/2015, 5:11 PM

## 2015-09-20 NOTE — Progress Notes (Signed)
Patient states something is wrong with home cpap unit. So we started by adding some water to his humdifier. Once he turned unit on and applied his nasal pillows on, unit showed that there was a air leak. He reapplied pillows for good seal. Mouth was closed however leak was remained. It seemed as if air was noted around humidifier however i could only offer hospital unit as a replacement solution for the patient. I suggested he followup with home health company to have someone come over and check unit. i suggested hospital auto unit while here however he decline since cannot humidifiy and cannot use pillows. Informed RN of findings.

## 2015-09-20 NOTE — Progress Notes (Signed)
[facility] MD Progress Note  09/20/2015 10:28 AM [place] Serpe  MRN:  [id number]   This is a follow up of Rhiley Solem on 09/20/2015    Subjective:   Mr. Sweigert states that he is again not doing well today.  He complains of nausea and desires to have a bowel movement. Patient initially stated that he had not gone at all but then said "well I guess a few squirts." He continues with anxiety and depression 10/10 but denies SI/HI/AVH. He was walking to group at the beginning of our interview but then stated that he was unable to walk and couldn't get out of bed, though he was in fact out of bed at that time. Patient requires a great deal of motivation. He was able to walk to group while discussing his family and life after interview finished.   Sleep was poor, appetite poor  Per nursing:  D: Observed pt in dayroom interacting with peers. Patient alert and oriented x4. Patient denies SI/HI/AVH. Pt affect is anxious, apprehensive and blunted. Pt stated his day was "very, very hard." due to anxiety. Pt rated depression 10/10 and anxiety 10/10. Pt c/o that room was too cold, and thermostat did not seem to be functioning appropriately. Pt was moved to adjacent room. Pt stated multiple times while being moved "this is too hard.Marland KitchenMarland KitchenI can't do this... I don't do well with change." Pt c/o of racing thoughts and difficulty sleeping. Pt needy and anxious. A: Offered active listening and support. Provided therapeutic communication. Administered scheduled medications. Encouraged pt to use positive coping skills for anxiety. Provided support and help to move pt to other room.  R: Pt pleasant and cooperative. Pt medication compliant. Pt became settled in room once move was complete. Will continue Q15 min. checks. Safety maintained.         Principal Problem: Severe recurrent major depression with psychotic features [facility]) Diagnosis:   Patient Active Problem List   Diagnosis Date Noted  . Hyponatremia [E87.1] 09/20/2015  .  Severe recurrent major depression with psychotic features ([place]) [F33.3] 09/19/2015  . Severe recurrent major depression without psychotic features ([place]) [F33.2] 09/17/2015  . Suicidal ideation [R45.851] 09/17/2015  . Cough [R05] 01/19/2015  . Restless leg [G25.81] 01/19/2015  . COLD (chronic obstructive lung disease) ([place]) [J44.9] 05/22/2014  . Lung nodule < 6cm on CT [R91.1] 05/22/2014  . OSA (obstructive sleep apnea) [G47.33] 07/11/2013  . Lung nodule [R91.1] 05/11/2013  . Nocturnal hypoxemia [G47.34] 05/11/2013  . Obstructive lung disease ([place]) [J44.9] 04/17/2013  . Chronic cough [R05] 04/17/2013  . Syncope [R55] 03/25/2011  . Hypertension [I10] 03/25/2011  . Hypokalemia [E87.6] 03/25/2011  . Asthma [J45.909] 03/25/2011  . Sciatica [M54.30] 03/25/2011  . SOB (shortness of breath) [R06.02] 03/25/2011   Total Time spent with patient: 45 minutes  Past Psychiatric History: Patient reports he has had depressive problems essentially all of his life. He's been seeing his current psychiatrist for years. The only past medicines he can remember were trial of Prozac and Cymbalta. He thinks the highest Prozac dose he was ever on was 40 mg a day. According to the medicine reconciliation looks like he was on Abilify at some point but he doesn't remember it. No history of suicide attempts. No prior psychiatric hospitalizations. No reported manic symptoms or history of psychosis.  Past Medical History:  Past Medical History  Diagnosis Date  . Hypertension   . Hyperlipemia   . Anxiety   . Asthma     Past Surgical History  Procedure Laterality Date  . Neck skin tag biopsy    . 2d echocardiogram  03/27/11  . Tonsillectomy and adenoidectomy    . Cataract extraction     Family History:  Family History  Problem Relation Age of Onset  . Emphysema Mother   . Allergies Mother   . Heart disease Father    Family Psychiatric  History: denies. possible mood issue with father.  Social History:   History  Alcohol Use  . 4.2 oz/week  . 7 Glasses of wine per week    Comment: 2-4 glasses of wine per night     History  Drug Use No    Social History   Social History  . Marital Status: Significant Other    Spouse Name: N/A  . Number of Children: N/A  . Years of Education: N/A   Occupational History  . retired    Social History Main Topics  . Smoking status: Former Smoker -- 1.00 packs/day for 5 years    Types: Cigarettes    Quit date: 04/25/1967  . Smokeless tobacco: None  . Alcohol Use: 4.2 oz/week    7 Glasses of wine per week     Comment: 2-4 glasses of wine per night  . Drug Use: No  . Sexual Activity: No   Other Topics Concern  . None   Social History Narrative   Additional Social History:                         Sleep: Poor  Appetite:  Poor  Current Medications: Current Facility-Administered Medications  Medication Dose Route Frequency Provider Last Rate Last Dose  . acetaminophen (TYLENOL) tablet 650 mg  650 mg Oral Q6H PRN Gonzella Lex, MD      . albuterol (PROVENTIL) (2.5 MG/3ML) 0.083% nebulizer solution 3 mL  3 mL Inhalation Q4H PRN Gonzella Lex, MD      . alum & mag hydroxide-simeth (MAALOX/MYLANTA) 200-200-20 MG/5ML suspension 30 mL  30 mL Oral Q4H PRN Gonzella Lex, MD      . bisacodyl (DULCOLAX) suppository 10 mg  10 mg Rectal Daily PRN Lamarkus Nebel L Hydie Langan, MD      . feeding supplement (ENSURE ENLIVE) (ENSURE ENLIVE) liquid 237 mL  237 mL Oral BID BM Gonzella Lex, MD   237 mL at 09/20/15 0957  . fluvoxaMINE (LUVOX) tablet 50 mg  50 mg Oral BID Gonzella Lex, MD   50 mg at 09/20/15 4696  . haloperidol (HALDOL) tablet 2 mg  2 mg Oral Q8H PRN Fynn Adel L Giavana Rooke, MD       Or  . haloperidol lactate (HALDOL) injection 5 mg  5 mg Intramuscular Q8H PRN Neosha Switalski L Raquelle Pietro, MD      . LORazepam (ATIVAN) tablet 0.5 mg  0.5 mg Oral QID Gonzella Lex, MD   0.5 mg at 09/20/15 0909  . magnesium hydroxide (MILK OF MAGNESIA) suspension 30 mL  30 mL Oral  Daily PRN Gonzella Lex, MD   30 mL at 09/19/15 1803  . polyethylene glycol (MIRALAX / GLYCOLAX) packet 17 g  17 g Oral BID Jim Like, MD   17 g at 09/20/15 0909  . QUEtiapine (SEROQUEL) tablet 150 mg  150 mg Oral QHS Jim Like, MD   150 mg at 09/19/15 2147  . tiotropium (SPIRIVA) inhalation capsule 18 mcg  18 mcg Inhalation Daily Gonzella Lex, MD   18 mcg at 09/20/15 [phone]  Lab Results:  Results for orders placed or performed during the hospital encounter of 09/17/15 (from the past 48 hour(s))  Basic metabolic panel     Status: Abnormal   Collection Time: 09/19/15  4:20 PM  Result Value Ref Range   Sodium 124 (L) 135 - 145 mmol/L   Potassium 4.3 3.5 - 5.1 mmol/L   Chloride 89 (L) 101 - 111 mmol/L   CO2 27 22 - 32 mmol/L   Glucose, Bld 111 (H) 65 - 99 mg/dL   BUN 14 6 - 20 mg/dL   Creatinine, Ser 0.86 0.61 - 1.24 mg/dL   Calcium 9.5 8.9 - 10.3 mg/dL   GFR calc non Af Amer >60 >60 mL/min   GFR calc Af Amer >60 >60 mL/min    Comment: (NOTE) The eGFR has been calculated using the CKD EPI equation. This calculation has not been validated in all clinical situations. eGFR's persistently <60 mL/min signify possible Chronic Kidney Disease.    Anion gap 8 5 - 15    Blood Alcohol level:  Lab Results  Component Value Date   ETH <5 09/17/2015    Physical Findings: AIMS: Facial and Oral Movements Muscles of Facial Expression: None, normal Lips and Perioral Area: None, normal Jaw: None, normal Tongue: None, normal,Extremity Movements Upper (arms, wrists, hands, fingers): None, normal Lower (legs, knees, ankles, toes): None, normal, Trunk Movements Neck, shoulders, hips: None, normal, Overall Severity Severity of abnormal movements (highest score from questions above): None, normal Incapacitation due to abnormal movements: None, normal Patient's awareness of abnormal movements (rate only patient's report): No Awareness, Dental Status Current problems with teeth and/or  dentures?: No Does patient usually wear dentures?: No  CIWA:  CIWA-Ar Total: 3 COWS:  COWS Total Score: 1  Musculoskeletal: Strength & Muscle Tone: within normal limits Gait & Station: normal Patient leans: N/A  Psychiatric Specialty Exam: Physical Exam  ROS  Blood pressure 157/73, pulse 86, temperature 98.1 F (36.7 C), temperature source Oral, resp. rate 20, height '5\' 10"'$  (1.778 m), weight 71.215 kg (157 lb), SpO2 98 %.Body mass index is 22.53 kg/(m^2).  General Appearance: Neat  Eye Contact:  Fair  Speech:  Normal Rate and Slow  Volume:  Decreased  Mood:  Anxious, Depressed, Dysphoric, Hopeless, Irritable and Worthless  Affect:  Depressed, Labile and Tearful  Thought Process:  Goal Directed  Orientation:  Full (Time, Place, and Person)  Thought Content:  Obsessions, Paranoid Ideation and Rumination  Suicidal Thoughts:  No  Homicidal Thoughts:  No  Memory:  Immediate;   Fair Recent;   Fair Remote;   Fair  Judgement:  Impaired  Insight:  Shallow  Psychomotor Activity:  Restlessness  Concentration:  Concentration: Fair  Recall:  Poor  Fund of Knowledge:  Fair  Language:  Fair  Akathisia:  No  Handed:  Right  AIMS (if indicated):     Assets:  Desire for Improvement Financial Resources/Insurance Housing Intimacy Leisure Time Physical Health Resilience  ADL's:  Intact  Cognition:  Impaired,  Mild  Sleep:  Number of Hours: 6      Treatment Plan Summary: Daily contact with patient to assess and evaluate symptoms and progress in treatment and Medication management   70 year old male with depression and suicidal intent with no clear plan/intent present for ECT. Patient is aware that he will not get ECT until Wednesday. In the meanwhile he is having his medications adjusted to prepare.   He complained of poor sleep, approx 3 hours, poor appetite,  anhedonia, hopelessness, and other depressive symptoms.   Patient prescribed Seroquel and Luvox to address sleep and anxiety  along with decreased dose of Lorazepam in preparation for ECT. He is not happy to be admitted at this time.  Patient has very poor insight into his disorder and perseverates on the reasons why he does not want to be here.    Patient had hyponatremia upon admission which is now trending down. Hospitalist plans to admit him to the IM service and evaluate him further prior to his returning to [place] for ECT.    PLAN: - discharge to medicine service - continue IVC  -patient will likely need to be readmitted to [facility] after discharge from medicine service    Observation Level/Precautions: Elopement Continuous Observation 15 minute checks  Suicide precautions  Laboratory: as ordered, NA has further decreased. Plan to discharge and admit to medicine service 5/29.  - consulted medicine 5/28  EKG CKR - in preparation for ECT   Psychotherapy: Per milieu, groups  Medications: Luvox 50 mg PO BID Lorazepam decreased (cut in half yesterday) Seroquel will be increased 150 mg PO QHS   Consultations:   Discharge Concerns: Patient still considerably depressed/ anxious.    Estimated LOS:   Other:           Alexandr Yaworski,  Maria Gallicchio L, MD 09/20/2015, 10:28 AM

## 2015-09-20 NOTE — BHH Suicide Risk Assessment (Addendum)
[facility] Discharge Suicide Risk Assessment   Principal Problem: Severe recurrent major depression with psychotic features [facility]) Discharge Diagnoses:  Patient Active Problem List   Diagnosis Date Noted  . Hyponatremia [E87.1] 09/20/2015  . Severe recurrent major depression with psychotic features ([place]) [F33.3] 09/19/2015  . Severe recurrent major depression without psychotic features ([place]) [F33.2] 09/17/2015  . Suicidal ideation [R45.851] 09/17/2015  . Cough [R05] 01/19/2015  . Restless leg [G25.81] 01/19/2015  . COLD (chronic obstructive lung disease) ([place]) [J44.9] 05/22/2014  . Lung nodule < 6cm on CT [R91.1] 05/22/2014  . OSA (obstructive sleep apnea) [G47.33] 07/11/2013  . Lung nodule [R91.1] 05/11/2013  . Nocturnal hypoxemia [G47.34] 05/11/2013  . Obstructive lung disease ([place]) [J44.9] 04/17/2013  . Chronic cough [R05] 04/17/2013  . Syncope [R55] 03/25/2011  . Hypertension [I10] 03/25/2011  . Hypokalemia [E87.6] 03/25/2011  . Asthma [J45.909] 03/25/2011  . Sciatica [M54.30] 03/25/2011  . SOB (shortness of breath) [R06.02] 03/25/2011    Total Time spent with patient: 45 minutes  Musculoskeletal: Strength & Muscle Tone: within normal limits Gait & Station: normal Patient leans: N/A  Psychiatric Specialty Exam: ROS  Blood pressure 157/73, pulse 86, temperature 98.1 F (36.7 C), temperature source Oral, resp. rate 20, height 5\' 10"  (1.778 m), weight 71.215 kg (157 lb), SpO2 98 %.Body mass index is 22.53 kg/(m^2).  General Appearance: Casual  Eye Contact::  Good  Speech:  Clear and Coherent409  Volume:  Normal  Mood:  Anxious, Dysphoric, Hopeless, Irritable and Worthless  Affect:  Restricted and anxious  Thought Process:  Goal Directed  Orientation:  Full (Time, Place, and Person)  Thought Content:  Obsessions, Paranoid Ideation and Rumination  Suicidal Thoughts:  No  Homicidal Thoughts:  No  Memory:  Immediate;   Poor Recent;   Fair Remote;   Fair  Judgement:  Poor   Insight:  Lacking  Psychomotor Activity:  Restlessness  Concentration:  Poor  Recall:  Poor  Fund of Knowledge:Fair  Language: Fair  Akathisia:  No   Handed:    AIMS (if indicated):     Assets:  Desire for Improvement Financial Resources/Insurance Housing Intimacy Resilience  Sleep:  Number of Hours: 6  Cognition: Impaired,  Mild  ADL's:  Intact   Mental Status Per Nursing Assessment::   On Admission:  NA  Demographic Factors:  Male and Caucasian  Loss Factors: Decrease in vocational status  Historical Factors: NA  Risk Reduction Factors:   Sense of responsibility to family, Religious beliefs about death, Living with another person, especially a relative and Positive social support  Continued Clinical Symptoms:  Severe Anxiety and/or Agitation Depression:   Anhedonia Hopelessness Insomnia  Cognitive Features That Contribute To Risk:  Closed-mindedness, Loss of executive function, Polarized thinking and Thought constriction (tunnel vision)    Suicide Risk:  Mild:  Suicidal ideation of limited frequency, intensity, duration, and specificity.  There are no identifiable plans, no associated intent, mild dysphoria and related symptoms, good self-control (both objective and subjective assessment), few other risk factors, and identifiable protective factors, including available and accessible social support.    Plan Of Care/Follow-up recommendations:  Other:  Patient being discharged to medical floor to adress hyponatremia. He will remain IVC'd and likely still require inpatient ECT after hyponatremia is treated.  Patient is very anxious and depressed but denies SI/HI/AVH. Requires frequent redirection and reminders about plan. Patient aware that he is being transferred for treatment of low sodium.   Patient currently taking Luvox 50 mg BID Seroquel 150 mg  QHS Ativan 0.25 mg  QID prn (decreased due to preparation for ECT) Bowel Regimen as needed   Jim Like,  MD 09/20/2015, 12:20 PM

## 2015-09-20 NOTE — Progress Notes (Signed)
D: Pt woke up at around 2am c/o of "crazy thoughts...hallucinating about the CPAP tube." Pt felt that the Lorrin Mais was the cause of it. Pt also c/o of problems with the CPAP and difficulty getting back to sleep. A: Respiratory therapist called and came down to look at CPAP R: Respiratory therapist apprised writer of situation and wrote note. Pt had difficulty falling back to sleep, but eventually did.

## 2015-09-20 NOTE — H&P (Addendum)
[place] [facility] [place] NAME: Matthew Patrick    MR#:  [id number]  DATE OF BIRTH:  [birth year]  DATE OF ADMISSION:  09/17/2015  PRIMARY CARE PHYSICIAN: Tivis Ringer, MD   REQUESTING/REFERRING PHYSICIAN Dr bell from psychiatry unit  CHIEF COMPLAINT:   Hyponatremia HISTORY OF PRESENT ILLNESS:  Matthew Patrick  is a 70 y.o. male with a known history of Severe depression requiring ECT and anxiety who who has been in the psychiatry unit for the past few days. Sodium level has been decreasing and is 124 today so hospital service was consulted for further evaluation. Shortness severe depression and anxiety and reports that he has been weak over the past 2 days as well as constipated. He has dysphoric mood and decreased by mouth intake. He does not drink a lot of water. He denies dry mouth, weight gain or dry skin.  PAST MEDICAL HISTORY:   Past Medical History  Diagnosis Date  . Hypertension   . Hyperlipemia   . Anxiety   . Asthma     PAST SURGICAL HISTORY:   Past Surgical History  Procedure Laterality Date  . Neck skin tag biopsy    . 2d echocardiogram  03/27/11  . Tonsillectomy and adenoidectomy    . Cataract extraction      SOCIAL HISTORY:   Social History  Substance Use Topics  . Smoking status: Former Smoker -- 1.00 packs/day for 5 years    Types: Cigarettes    Quit date: 04/25/1967  . Smokeless tobacco: Not on file  . Alcohol Use: 4.2 oz/week    7 Glasses of wine per week     Comment: 2-4 glasses of wine per night    FAMILY HISTORY:   Family History  Problem Relation Age of Onset  . Emphysema Mother   . Allergies Mother   . Heart disease Father     DRUG ALLERGIES:  No Known Allergies  REVIEW OF SYSTEMS:   Review of Systems  Constitutional: Negative for fever, chills and malaise/fatigue.  HENT: Negative for ear discharge, ear pain, hearing loss, nosebleeds and sore throat.   Eyes: Negative for blurred vision and pain.   Respiratory: Negative for cough, hemoptysis, shortness of breath and wheezing.   Cardiovascular: Negative for chest pain, palpitations and leg swelling.  Gastrointestinal: Positive for constipation. Negative for nausea, vomiting, abdominal pain, diarrhea and blood in stool.  Genitourinary: Negative for dysuria.  Musculoskeletal: Negative for back pain.  Neurological: Positive for weakness. Negative for dizziness, tremors, speech change, focal weakness, seizures and headaches.  Endo/Heme/Allergies: Does not bruise/bleed easily.  Psychiatric/Behavioral: Positive for depression. Negative for suicidal ideas and hallucinations. The patient is nervous/anxious.     MEDICATIONS AT HOME:   Prior to Admission medications   Medication Sig Start Date End Date Taking? Authorizing Provider  ARIPiprazole (ABILIFY) 2 MG tablet Take 2 mg by mouth daily.   Yes Historical Provider, MD  aspirin 81 MG tablet Take 81 mg by mouth daily.   Yes Historical Provider, MD  DULoxetine (CYMBALTA) 60 MG capsule Take 60 mg by mouth daily.   Yes Historical Provider, MD  fluticasone (FLONASE) 50 MCG/ACT nasal spray Place 2 sprays into both nostrils daily.   Yes Historical Provider, MD  gabapentin (NEURONTIN) 100 MG capsule TAKE 2 CAPSULES AT BEDTIME 07/26/15  Yes Brand Males, MD  ibuprofen (ADVIL,MOTRIN) 600 MG tablet Take 600 mg by mouth every 6 (six) hours as needed. For headache    Yes Historical Provider,  MD  LORazepam (ATIVAN) 1 MG tablet Take 1 mg by mouth 2 (two) times daily.    Yes Historical Provider, MD  mirtazapine (REMERON) 15 MG tablet Take 15 mg by mouth at bedtime.    Yes Historical Provider, MD  SPIRIVA HANDIHALER 18 MCG inhalation capsule PLACE 1 CAPSULE (18 MCG TOTAL) INTO INHALER AND INHALE DAILY. 05/25/15  Yes Brand Males, MD  phenylephrine (SUDAFED PE) 10 MG TABS tablet Take 10 mg by mouth every 4 (four) hours as needed.    Historical Provider, MD  PROAIR HFA 108 (90 Base) MCG/ACT inhaler INHALE 2  PUFFS INTO THE LUNGS EVERY 6 HOURS AS NEEDED FOR WHEEZING OR SHORTNESS OF BREATH 08/02/15   Brand Males, MD      VITAL SIGNS:  Blood pressure 157/73, pulse 86, temperature 98.1 F (36.7 C), temperature source Oral, resp. rate 20, height 5\' 10"  (1.778 m), weight 71.215 kg (157 lb), SpO2 98 %.  PHYSICAL EXAMINATION:   Physical Exam  Constitutional: He is oriented to person, place, and time and well-developed, well-nourished, and in no distress. No distress.  HENT:  Head: Normocephalic.  Eyes: No scleral icterus.  Neck: Normal range of motion. Neck supple. No JVD present. No tracheal deviation present.  Cardiovascular: Normal rate, regular rhythm and normal heart sounds.  Exam reveals no gallop and no friction rub.   No murmur heard. Pulmonary/Chest: Effort normal and breath sounds normal. No respiratory distress. He has no wheezes. He has no rales. He exhibits no tenderness.  Abdominal: Soft. Bowel sounds are normal. He exhibits no distension and no mass. There is no tenderness. There is no rebound and no guarding.  Musculoskeletal: Normal range of motion. He exhibits no edema.  Neurological: He is alert and oriented to person, place, and time.  Skin: Skin is warm. No rash noted. No erythema.  Psychiatric: Affect and judgment normal.  Depressed affect      LABORATORY PANEL:   CBC  Recent Labs Lab 09/17/15 1445  WBC 10.4  HGB 13.7  HCT 38.5*  PLT 264   ------------------------------------------------------------------------------------------------------------------  Chemistries   Recent Labs Lab 09/17/15 1445  09/19/15 1620  NA 126*  < > 124*  K 4.1  < > 4.3  CL 93*  < > 89*  CO2 25  < > 27  GLUCOSE 101*  < > 111*  BUN 9  < > 14  CREATININE 0.92  < > 0.86  CALCIUM 9.4  < > 9.5  AST 26  --   --   ALT 25  --   --   ALKPHOS 71  --   --   BILITOT 0.5  --   --   < > = values in this interval not  displayed. ------------------------------------------------------------------------------------------------------------------  Cardiac Enzymes No results for input(s): TROPONINI in the last 168 hours. ------------------------------------------------------------------------------------------------------------------  RADIOLOGY:  No results found.  EKG:     IMPRESSION AND PLAN:   70 year old male admitted initially to behavioral health for depression and possible ECT who has hyponatremia.  1. Severe hyponatremia: Continue sodium checks every 6 hours. Nephrology consultation. Order serum osmolality, TSH, cortisol and uric acid level. Start IV fluids with repeat sodium level within 6 hours.  2. Depression and anxiety: Patient will continue to require psychiatry consultation and sitter at bedside. Continue Luvox and Seroquel 3. OSA: Patient has CPAP machine which he will continue to require.   4. Constipation: We'll provide stool softeners and order KUB. All the records are reviewed and case discussed with  ED provider. Management plans discussed with the patient and he in agreement  CODE STATUS: FULL  CRITICAL CARE TOTAL TIME TAKING CARE OF THIS PATIENT: 50 minutes.   Due to low sodium and  Decreasing  Matthew Patrick M.D on 09/20/2015 at 11:13 AM  Between 7am to 6pm - Pager - [phone]  After 6pm go to [email or web address] - password [email or web address] [place] [facility]  Office  [phone]  CC: Primary care physician; Tivis Ringer, MD

## 2015-09-20 NOTE — Plan of Care (Signed)
Problem: Self-Concept: Goal: Level of anxiety will decrease Outcome: Progressing Pt rated anxiety 9/10. Pt appears anxious.

## 2015-09-20 NOTE — Progress Notes (Signed)
D: Observed pt in dayroom interacting with peers. Patient alert and oriented x4. Patient denies SI/HI/AVH. Pt affect is anxious, apprehensive and blunted. Pt stated his day was "very, very hard." due to anxiety. Pt rated depression 10/10 and anxiety 10/10. Pt c/o that room was too cold, and thermostat did not seem to be functioning appropriately. Pt was moved to adjacent room. Pt stated multiple times while being moved "this is too hard.Marland KitchenMarland KitchenI can't do this... I don't do well with change." Pt c/o of racing thoughts and difficulty sleeping. Pt needy and anxious. A: Offered active listening and support. Provided therapeutic communication. Administered scheduled medications. Encouraged pt to use positive coping skills for anxiety. Provided support and help to move pt to other room.  R: Pt pleasant and cooperative. Pt medication compliant. Pt became settled in room once move was complete. Will continue Q15 min. checks. Safety maintained.

## 2015-09-21 ENCOUNTER — Ambulatory Visit: Payer: Medicare Other | Admitting: Licensed Clinical Social Worker

## 2015-09-21 DIAGNOSIS — E222 Syndrome of inappropriate secretion of antidiuretic hormone: Secondary | ICD-10-CM

## 2015-09-21 DIAGNOSIS — F332 Major depressive disorder, recurrent severe without psychotic features: Secondary | ICD-10-CM

## 2015-09-21 LAB — BASIC METABOLIC PANEL
Anion gap: 9 (ref 5–15)
BUN: 15 mg/dL (ref 6–20)
CO2: 23 mmol/L (ref 22–32)
CREATININE: 0.74 mg/dL (ref 0.61–1.24)
Calcium: 9 mg/dL (ref 8.9–10.3)
Chloride: 92 mmol/L — ABNORMAL LOW (ref 101–111)
Glucose, Bld: 101 mg/dL — ABNORMAL HIGH (ref 65–99)
Potassium: 4.2 mmol/L (ref 3.5–5.1)
Sodium: 124 mmol/L — ABNORMAL LOW (ref 135–145)

## 2015-09-21 LAB — SODIUM
SODIUM: 122 mmol/L — AB (ref 135–145)
SODIUM: 123 mmol/L — AB (ref 135–145)
SODIUM: 124 mmol/L — AB (ref 135–145)
SODIUM: 125 mmol/L — AB (ref 135–145)

## 2015-09-21 LAB — CBC
HCT: 36 % — ABNORMAL LOW (ref 40.0–52.0)
Hemoglobin: 12.7 g/dL — ABNORMAL LOW (ref 13.0–18.0)
MCH: 31.3 pg (ref 26.0–34.0)
MCHC: 35.2 g/dL (ref 32.0–36.0)
MCV: 88.9 fL (ref 80.0–100.0)
PLATELETS: 257 10*3/uL (ref 150–440)
RBC: 4.04 MIL/uL — AB (ref 4.40–5.90)
RDW: 12.1 % (ref 11.5–14.5)
WBC: 7.6 10*3/uL (ref 3.8–10.6)

## 2015-09-21 LAB — OSMOLALITY: Osmolality: 265 mOsm/kg — ABNORMAL LOW (ref 275–295)

## 2015-09-21 LAB — OSMOLALITY, URINE: OSMOLALITY UR: 552 mosm/kg (ref 300–900)

## 2015-09-21 MED ORDER — MAGNESIUM HYDROXIDE 400 MG/5ML PO SUSP
30.0000 mL | Freq: Two times a day (BID) | ORAL | Status: AC
  Administered 2015-09-21 (×2): 30 mL via ORAL
  Filled 2015-09-21 (×2): qty 30

## 2015-09-21 MED ORDER — CLONIDINE HCL 0.1 MG PO TABS: 0.1000 mg | ORAL_TABLET | Freq: Four times a day (QID) | ORAL | Status: DC | PRN

## 2015-09-21 MED ORDER — BOOST / RESOURCE BREEZE PO LIQD
1.0000 | Freq: Two times a day (BID) | ORAL | Status: DC
  Administered 2015-09-21 – 2015-09-22 (×3): 1 via ORAL

## 2015-09-21 NOTE — Progress Notes (Addendum)
[facility] NAME: Matthew Patrick    MR#:  [id number]  DATE OF BIRTH:  Aug 02, 1945  SUBJECTIVE:  CHIEF COMPLAINT:  No chief complaint on file. Patient is a 70 year old male with past medical history significant for history of severe depression requiring ECT who has been in psychiatric unit for past few days, was noted to have decreasing sodium levels, hospitalist services were consulted and patient was transferred to medicine. Patient complains of weakness, dizziness, lightheadedness, feeling constipated, he also admits of decreased oral intake, denies drinking lots of water.  Review of Systems  Constitutional: Positive for malaise/fatigue. Negative for fever, chills and weight loss.  HENT: Negative for congestion.   Eyes: Negative for blurred vision and double vision.  Respiratory: Negative for cough, sputum production, shortness of breath and wheezing.   Cardiovascular: Negative for chest pain, palpitations, orthopnea, leg swelling and PND.  Gastrointestinal: Negative for nausea, vomiting, abdominal pain, diarrhea, constipation and blood in stool.  Genitourinary: Negative for dysuria, urgency, frequency and hematuria.  Musculoskeletal: Negative for falls.  Neurological: Positive for dizziness, sensory change and weakness. Negative for tremors, focal weakness and headaches.  Endo/Heme/Allergies: Does not bruise/bleed easily.  Psychiatric/Behavioral: Negative for depression. The patient does not have insomnia.     VITAL SIGNS: Blood pressure 179/79, pulse 92, temperature 97.7 F (36.5 C), temperature source Oral, resp. rate 18, height 5\' 10"  (1.778 m), weight 71 kg (156 lb 8.4 oz), SpO2 96 %.  PHYSICAL EXAMINATION:   GENERAL:  70 y.o.-year-old patient lying in the bed with no acute distress. Tremulous, uncomfortable, somewhat unsteady on his feet, diaphoretic hands EYES: Pupils equal, round, reactive to light and accommodation. No  scleral icterus. Extraocular muscles intact.  HEENT: Head atraumatic, normocephalic. Oropharynx and nasopharynx clear.  NECK:  Supple, no jugular venous distention. No thyroid enlargement, no tenderness.  LUNGS: Normal breath sounds bilaterally, no wheezing, rales,rhonchi or crepitation. No use of accessory muscles of respiration.  CARDIOVASCULAR: S1, S2 normal. No murmurs, rubs, or gallops.  ABDOMEN: Soft, nontender, nondistended. Bowel sounds present. No organomegaly or mass.  EXTREMITIES: No pedal edema, cyanosis, or clubbing.  NEUROLOGIC: Cranial nerves II through XII are intact. Muscle strength 5/5 in all extremities. Sensation intact. Gait not checked.  PSYCHIATRIC: The patient is alert and oriented x 3.  SKIN: No obvious rash, lesion, or ulcer.   ORDERS/RESULTS REVIEWED:   CBC  Recent Labs Lab 09/17/15 1445 09/20/15 1131 09/20/15 1747 09/21/15 0207  WBC 10.4 8.7 9.4 7.6  HGB 13.7 13.7 13.9 12.7*  HCT 38.5* 38.5* 39.0* 36.0*  PLT 264 272 286 257  MCV 90.0 90.3 89.3 88.9  MCH 32.1 32.1 31.8 31.3  MCHC 35.7 35.5 35.6 35.2  RDW 11.8 12.0 12.0 12.1   ------------------------------------------------------------------------------------------------------------------  Chemistries   Recent Labs Lab 09/17/15 1445 09/18/15 0726 09/19/15 1620 09/20/15 1131 09/20/15 1747 09/20/15 2339 09/21/15 0207 09/21/15 0726 09/21/15 1257  NA 126* 127* 124* 123* 123* 122* 124* 124* 125*  K 4.1 4.0 4.3  --   --   --  4.2  --   --   CL 93* 96* 89*  --   --   --  92*  --   --   CO2 25 23 27   --   --   --  23  --   --   GLUCOSE 101* 100* 111*  --   --   --  101*  --   --   BUN 9  9 14  --   --   --  15  --   --   CREATININE 0.92 0.77 0.86 0.83 0.76  --  0.74  --   --   CALCIUM 9.4 9.4 9.5  --   --   --  9.0  --   --   AST 26  --   --   --   --   --   --   --   --   ALT 25  --   --   --   --   --   --   --   --   ALKPHOS 71  --   --   --   --   --   --   --   --   BILITOT 0.5  --   --    --   --   --   --   --   --    ------------------------------------------------------------------------------------------------------------------ estimated creatinine clearance is 86.3 mL/min (by C-G formula based on Cr of 0.74). ------------------------------------------------------------------------------------------------------------------ No results for input(s): TSH, T4TOTAL, T3FREE, THYROIDAB in the last 72 hours.  Invalid input(s): FREET3  Cardiac Enzymes No results for input(s): CKMB, TROPONINI, MYOGLOBIN in the last 168 hours.  Invalid input(s): CK ------------------------------------------------------------------------------------------------------------------ Invalid input(s): POCBNP ---------------------------------------------------------------------------------------------------------------  RADIOLOGY: No results found.  EKG:  Orders placed or performed during the hospital encounter of 09/17/15  . EKG 12-Lead  . EKG 12-Lead    ASSESSMENT AND PLAN:  Active Problems:   Hyponatremia   SIADH (syndrome of inappropriate ADH production) ([place]) #1. Hyponatremia due to SIADH?, urine osmolality was checked on NS solution, likely unreliable, suspected due to Luvox, discontinue it for now while following serial Na levels, get psychiatrist to change medications as needed #2, lightheadedness, dizziness, weakness, possibly related to underlying depression and hyponatremia, get physical therapist involvement #3 anemia, get Hemoccult #4. Depression, get psychiatrist involved for recommendations, adjust medications since Luvox may have caused SIADH/hyponatremia   Management plans discussed with the patient, family and they are in agreement.   DRUG ALLERGIES: No Known Allergies  CODE STATUS:     Code Status Orders        Start     Ordered   09/20/15 1306  Full code   Continuous     09/20/15 1307    Code Status History    Date Active Date Inactive Code Status Order ID  Comments User Context   09/17/2015 11:13 PM 09/20/2015  1:07 PM Full Code [id number]  Gonzella Lex, MD Inpatient      TOTAL TIME TAKING CARE OF THIS PATIENT: 40 minutes.  Discussed with Dr. Charlesetta Shanks M.D on 09/21/2015 at 5:41 PM  Between 7am to 6pm - Pager - [phone]  After 6pm go to [email or web address] - password [email or web address] [facility]  Office  [phone]  CC: Primary care physician; Tivis Ringer, MD

## 2015-09-21 NOTE — Consult Note (Signed)
[facility] Consult   Reason for Consult:  Follow-up consult for 70 year old man with a history of major depression. He had been referred to our service through the emergency room because of recurrent depression and concern about need for ECT. Over the weekend he was transferred to the medical service because of hyponatremia Referring Physician:  [place] Patient Identification: Matthew Patrick MRN:  [id number] Principal Diagnosis: Major depression severe recurrent without Diagnosis:   Patient Active Problem List   Diagnosis Date Noted  . SIADH (syndrome of inappropriate ADH production) ([place]) [E22.2] 09/21/2015  . Hyponatremia [E87.1] 09/20/2015  . Severe recurrent major depression with psychotic features ([place]) [F33.3] 09/19/2015  . Severe recurrent major depression without psychotic features ([place]) [F33.2] 09/17/2015  . Suicidal ideation [R45.851] 09/17/2015  . Cough [R05] 01/19/2015  . Restless leg [G25.81] 01/19/2015  . COLD (chronic obstructive lung disease) ([place]) [J44.9] 05/22/2014  . Lung nodule < 6cm on CT [R91.1] 05/22/2014  . OSA (obstructive sleep apnea) [G47.33] 07/11/2013  . Lung nodule [R91.1] 05/11/2013  . Nocturnal hypoxemia [G47.34] 05/11/2013  . Obstructive lung disease ([place]) [J44.9] 04/17/2013  . Chronic cough [R05] 04/17/2013  . Syncope [R55] 03/25/2011  . Hypertension [I10] 03/25/2011  . Hypokalemia [E87.6] 03/25/2011  . Asthma [J45.909] 03/25/2011  . Sciatica [M54.30] 03/25/2011  . SOB (shortness of breath) [R06.02] 03/25/2011    Total Time spent with patient: 30 minutes  Subjective:   Matthew Patrick is a 70 y.o. male patient admitted with "I'm still feeling depressed".  HPI:  See earlier intake note. This 70 year old man has a history of recurrent severe depression. He had been declining in his functioning and feeling more anxious despite multiple medicines. He presented to the emergency room on Friday and decision was made to admit him to psychiatry  pending possible ECT treatment this week. Patient developed hyponatremia and was getting worse and was transferred to medicine. On interview today he reports he is still feeling very depressed. Affect is down. Denies any suicidal thought intent or plan. Denies any psychotic symptoms. Sleeping poorly. Woke up in the middle the night last night and has not been able to sleep again since then.  Past Psychiatric History: Patient has a history of recurrent severe depression multiple medicines have been tried. No clear-cut history of mania  Risk to Self: Is patient at risk for suicide?: No Risk to Others:   Prior Inpatient Therapy:   Prior Outpatient Therapy:    Past Medical History:  Past Medical History  Diagnosis Date  . Hypertension   . Hyperlipemia   . Anxiety   . Asthma   . Depression     Past Surgical History  Procedure Laterality Date  . Neck skin tag biopsy    . 2d echocardiogram  03/27/11  . Tonsillectomy and adenoidectomy    . Cataract extraction     Family History:  Family History  Problem Relation Age of Onset  . Emphysema Mother   . Allergies Mother   . Heart disease Father    Family Psychiatric  History: Positive for anxiety and depression Social History:  History  Alcohol Use  . 4.2 oz/week  . 7 Glasses of wine per week    Comment: 2-4 glasses of wine per night, none in 3 weeks     History  Drug Use No    Social History   Social History  . Marital Status: Significant Other    Spouse Name: N/A  . Number of Children: N/A  . Years of Education:  N/A   Occupational History  . retired    Social History Main Topics  . Smoking status: Former Smoker -- 1.00 packs/day for 5 years    Types: Cigarettes    Quit date: 04/25/1967  . Smokeless tobacco: None  . Alcohol Use: 4.2 oz/week    7 Glasses of wine per week     Comment: 2-4 glasses of wine per night, none in 3 weeks  . Drug Use: No  . Sexual Activity: No   Other Topics Concern  . None   Social  History Narrative   Additional Social History:    Allergies:  No Known Allergies  Labs:  Results for orders placed or performed during the hospital encounter of 09/20/15 (from the past 48 hour(s))  CBC     Status: Abnormal   Collection Time: 09/20/15 11:31 AM  Result Value Ref Range   WBC 8.7 3.8 - 10.6 K/uL   RBC 4.27 (L) 4.40 - 5.90 MIL/uL   Hemoglobin 13.7 13.0 - 18.0 g/dL   HCT 38.5 (L) 40.0 - 52.0 %   MCV 90.3 80.0 - 100.0 fL   MCH 32.1 26.0 - 34.0 pg   MCHC 35.5 32.0 - 36.0 g/dL   RDW 12.0 11.5 - 14.5 %   Platelets 272 150 - 440 K/uL  Creatinine, serum     Status: None   Collection Time: 09/20/15 11:31 AM  Result Value Ref Range   Creatinine, Ser 0.83 0.61 - 1.24 mg/dL   GFR calc non Af Amer >60 >60 mL/min   GFR calc Af Amer >60 >60 mL/min    Comment: (NOTE) The eGFR has been calculated using the CKD EPI equation. This calculation has not been validated in all clinical situations. eGFR's persistently <60 mL/min signify possible Chronic Kidney Disease.   CBC     Status: Abnormal   Collection Time: 09/20/15  5:47 PM  Result Value Ref Range   WBC 9.4 3.8 - 10.6 K/uL   RBC 4.36 (L) 4.40 - 5.90 MIL/uL   Hemoglobin 13.9 13.0 - 18.0 g/dL   HCT 39.0 (L) 40.0 - 52.0 %   MCV 89.3 80.0 - 100.0 fL   MCH 31.8 26.0 - 34.0 pg   MCHC 35.6 32.0 - 36.0 g/dL   RDW 12.0 11.5 - 14.5 %   Platelets 286 150 - 440 K/uL  Creatinine, serum     Status: None   Collection Time: 09/20/15  5:47 PM  Result Value Ref Range   Creatinine, Ser 0.76 0.61 - 1.24 mg/dL   GFR calc non Af Amer >60 >60 mL/min   GFR calc Af Amer >60 >60 mL/min    Comment: (NOTE) The eGFR has been calculated using the CKD EPI equation. This calculation has not been validated in all clinical situations. eGFR's persistently <60 mL/min signify possible Chronic Kidney Disease.   Sodium     Status: Abnormal   Collection Time: 09/20/15  5:47 PM  Result Value Ref Range   Sodium 123 (L) 135 - 145 mmol/L  Sodium      Status: Abnormal   Collection Time: 09/20/15 11:39 PM  Result Value Ref Range   Sodium 122 (L) 135 - 145 mmol/L  Basic metabolic panel     Status: Abnormal   Collection Time: 09/21/15  2:07 AM  Result Value Ref Range   Sodium 124 (L) 135 - 145 mmol/L   Potassium 4.2 3.5 - 5.1 mmol/L   Chloride 92 (L) 101 - 111 mmol/L   CO2  23 22 - 32 mmol/L   Glucose, Bld 101 (H) 65 - 99 mg/dL   BUN 15 6 - 20 mg/dL   Creatinine, Ser 0.74 0.61 - 1.24 mg/dL   Calcium 9.0 8.9 - 10.3 mg/dL   GFR calc non Af Amer >60 >60 mL/min   GFR calc Af Amer >60 >60 mL/min    Comment: (NOTE) The eGFR has been calculated using the CKD EPI equation. This calculation has not been validated in all clinical situations. eGFR's persistently <60 mL/min signify possible Chronic Kidney Disease.    Anion gap 9 5 - 15  CBC     Status: Abnormal   Collection Time: 09/21/15  2:07 AM  Result Value Ref Range   WBC 7.6 3.8 - 10.6 K/uL   RBC 4.04 (L) 4.40 - 5.90 MIL/uL   Hemoglobin 12.7 (L) 13.0 - 18.0 g/dL   HCT 36.0 (L) 40.0 - 52.0 %   MCV 88.9 80.0 - 100.0 fL   MCH 31.3 26.0 - 34.0 pg   MCHC 35.2 32.0 - 36.0 g/dL   RDW 12.1 11.5 - 14.5 %   Platelets 257 150 - 440 K/uL  Sodium     Status: Abnormal   Collection Time: 09/21/15  7:26 AM  Result Value Ref Range   Sodium 124 (L) 135 - 145 mmol/L  Sodium     Status: Abnormal   Collection Time: 09/21/15 12:57 PM  Result Value Ref Range   Sodium 125 (L) 135 - 145 mmol/L  Osmolality     Status: Abnormal   Collection Time: 09/21/15 12:57 PM  Result Value Ref Range   Osmolality 265 (L) 275 - 295 mOsm/kg  Osmolality, urine     Status: None   Collection Time: 09/21/15  3:00 PM  Result Value Ref Range   Osmolality, Ur 552 300 - 900 mOsm/kg  Sodium     Status: Abnormal   Collection Time: 09/21/15  7:14 PM  Result Value Ref Range   Sodium 123 (L) 135 - 145 mmol/L    Current Facility-Administered Medications  Medication Dose Route Frequency Provider Last Rate Last Dose  .  acetaminophen (TYLENOL) tablet 650 mg  650 mg Oral Q6H PRN Bettey Costa, MD       Or  . acetaminophen (TYLENOL) suppository 650 mg  650 mg Rectal Q6H PRN Bettey Costa, MD      . cloNIDine (CATAPRES) tablet 0.1 mg  0.1 mg Oral Q6H PRN Debby Crosley, MD      . enoxaparin (LOVENOX) injection 40 mg  40 mg Subcutaneous Q24H Bettey Costa, MD   40 mg at 09/20/15 2158  . feeding supplement (BOOST / RESOURCE BREEZE) liquid 1 Container  1 Container Oral BID BM Theodoro Grist, MD   1 Container at 09/21/15 1838  . LORazepam (ATIVAN) tablet 0.25 mg  0.25 mg Oral QID Bettey Costa, MD   0.25 mg at 09/21/15 2111  . ondansetron (ZOFRAN) tablet 4 mg  4 mg Oral Q6H PRN Bettey Costa, MD       Or  . ondansetron (ZOFRAN) injection 4 mg  4 mg Intravenous Q6H PRN Bettey Costa, MD      . QUEtiapine (SEROQUEL) tablet 150 mg  150 mg Oral QHS Bettey Costa, MD   150 mg at 09/21/15 2111  . senna-docusate (Senokot-S) tablet 1 tablet  1 tablet Oral QHS PRN Bettey Costa, MD      . tiotropium (SPIRIVA) inhalation capsule 18 mcg  18 mcg Inhalation Daily Bettey Costa, MD  18 mcg at 09/21/15 7824    Musculoskeletal: Strength & Muscle Tone: within normal limits Gait & Station: normal Patient leans: N/A  Psychiatric Specialty Exam: Physical Exam  Nursing note and vitals reviewed. Constitutional: He appears well-developed and well-nourished.  HENT:  Head: Normocephalic and atraumatic.  Eyes: Conjunctivae are normal. Pupils are equal, round, and reactive to light.  Neck: Normal range of motion.  Cardiovascular: Regular rhythm and normal heart sounds.   Respiratory: Effort normal.  GI: Soft.  Musculoskeletal: Normal range of motion.  Neurological: He is alert.  Skin: Skin is warm and dry.  Psychiatric: His mood appears anxious. His affect is blunt. His speech is delayed. He is slowed. Cognition and memory are impaired. He exhibits a depressed mood. He expresses no suicidal ideation.    Review of Systems  Constitutional: Negative.   HENT:  Negative.   Eyes: Negative.   Respiratory: Negative.   Cardiovascular: Negative.   Gastrointestinal: Negative.   Musculoskeletal: Negative.   Skin: Negative.   Neurological: Negative.   Psychiatric/Behavioral: Positive for depression and memory loss. Negative for suicidal ideas, hallucinations and substance abuse. The patient is nervous/anxious and has insomnia.     Blood pressure 153/86, pulse 112, temperature 98.3 F (36.8 C), temperature source Oral, resp. rate 20, height _0  (1.778 m), weight 71 kg (156 lb 8.4 oz), SpO2 94 %.Body mass index is 22.46 kg/(m^2).  General Appearance: Casual  Eye Contact:  Good  Speech:  Clear and Coherent  Volume:  Decreased  Mood:  Depressed  Affect:  Depressed  Thought Process:  Goal Directed  Orientation:  Full (Time, Place, and Person)  Thought Content:  Logical  Suicidal Thoughts:  No  Homicidal Thoughts:  No  Memory:  Immediate;   Good Recent;   Fair Remote;   Fair  Judgement:  Fair  Insight:  Fair  Psychomotor Activity:  Decreased  Concentration:  Concentration: Fair  Recall:  [employer] of Knowledge:  Fair  Language:  Fair  Akathisia:  No  Handed:  Right  AIMS (if indicated):     Assets:  Desire for Improvement Financial Resources/Insurance Housing Resilience Social Support  ADL's:  Intact  Cognition:  WNL  Sleep:        Treatment Plan Summary: Daily contact with patient to assess and evaluate symptoms and progress in treatment, Medication management and Plan Patient with recurrent severe depression. Hyponatremia does not seem to be resolving very well so far. I agree with discontinuing any medicines that could be involved as appropriate. Haldol discontinued. Artie off antidepressants. We can leave the quetiapine that may help him sleep at night. Patient is not ready for ECT tomorrow. Will need further stabilization on medicine. Earliest possible date will be Friday. I will continue to monitor and follow-up as  needed.  Disposition: Recommend psychiatric Inpatient admission when medically cleared. Supportive therapy provided about ongoing stressors.  Alethia Berthold, MD 09/21/2015 10:11 PM

## 2015-09-21 NOTE — Progress Notes (Signed)
Dr.Crosley returned paged placing new orders.

## 2015-09-21 NOTE — Consult Note (Signed)
[facility]  CONSULT NOTE    Date: 09/21/2015                  Patient Name:  Matthew Patrick  MRN: [id number]  DOB: Sep 17, 1945  Age / Sex: 70 y.o., male         PCP: Tivis Ringer, MD                 Service Requesting Consult: Dr. Benjie Karvonen                 Reason for Consult: Hyponatremia            History of Present Illness: Matthew Patrick is a 70 y.o. white male with major depressive disorder, hypertension, hyperlipidemia, generalized anxiety disorder, asthma/COPD, cataracts who was admitted to [facility] on 09/20/2015 for hyponatremia  Admitted to behavioral health where patient states he was started on fluvoxamine on this admission. Admitted with sodium of 126. Baseline of 135.   Appetite is good. Denies polydipsia. Started on NS at 122mL/hr  Medications: Outpatient medications: Prescriptions prior to admission  Medication Sig Dispense Refill Last Dose  . aspirin 81 MG tablet Take 81 mg by mouth daily.     . fluticasone (FLONASE) 50 MCG/ACT nasal spray Place 2 sprays into both nostrils daily.     Marland Kitchen gabapentin (NEURONTIN) 300 MG capsule Take 300 mg by mouth at bedtime.     Marland Kitchen LORazepam (ATIVAN) 1 MG tablet Take by mouth 4 (four) times daily.     . QUEtiapine (SEROQUEL) 100 MG tablet Take 150 mg by mouth at bedtime.       Current medications: Current Facility-Administered Medications  Medication Dose Route Frequency Provider Last Rate Last Dose  . 0.9 %  sodium chloride infusion   Intravenous Continuous Bettey Costa, MD 100 mL/hr at 09/21/15 0346    . acetaminophen (TYLENOL) tablet 650 mg  650 mg Oral Q6H PRN Bettey Costa, MD       Or  . acetaminophen (TYLENOL) suppository 650 mg  650 mg Rectal Q6H PRN Sital Mody, MD      . enoxaparin (LOVENOX) injection 40 mg  40 mg Subcutaneous Q24H Bettey Costa, MD   40 mg at 09/20/15 2158  . fluvoxaMINE (LUVOX) tablet 50 mg  50 mg Oral BID Bettey Costa, MD   50 mg at 09/21/15 0915  . haloperidol (HALDOL) tablet 2 mg  2 mg Oral  Q8H PRN Bettey Costa, MD   2 mg at 09/21/15 0344  . haloperidol lactate (HALDOL) injection 5 mg  5 mg Intramuscular Q8H PRN Bettey Costa, MD      . LORazepam (ATIVAN) tablet 0.25 mg  0.25 mg Oral QID Bettey Costa, MD   0.25 mg at 09/21/15 0915  . magnesium hydroxide (MILK OF MAGNESIA) suspension 30 mL  30 mL Oral BID Theodoro Grist, MD      . ondansetron (ZOFRAN) tablet 4 mg  4 mg Oral Q6H PRN Bettey Costa, MD       Or  . ondansetron (ZOFRAN) injection 4 mg  4 mg Intravenous Q6H PRN Bettey Costa, MD      . QUEtiapine (SEROQUEL) tablet 150 mg  150 mg Oral QHS Bettey Costa, MD   150 mg at 09/20/15 2155  . senna-docusate (Senokot-S) tablet 1 tablet  1 tablet Oral QHS PRN Bettey Costa, MD      . tiotropium (SPIRIVA) inhalation capsule 18 mcg  18 mcg Inhalation Daily Bettey Costa, MD   18 mcg  at 09/21/15 0826      Allergies: No Known Allergies    Past Medical History: Past Medical History  Diagnosis Date  . Hypertension   . Hyperlipemia   . Anxiety   . Asthma   . Depression      Past Surgical History: Past Surgical History  Procedure Laterality Date  . Neck skin tag biopsy    . 2d echocardiogram  03/27/11  . Tonsillectomy and adenoidectomy    . Cataract extraction       Family History: Family History  Problem Relation Age of Onset  . Emphysema Mother   . Allergies Mother   . Heart disease Father      Social History: Social History   Social History  . Marital Status: Significant Other    Spouse Name: N/A  . Number of Children: N/A  . Years of Education: N/A   Occupational History  . retired    Social History Main Topics  . Smoking status: Former Smoker -- 1.00 packs/day for 5 years    Types: Cigarettes    Quit date: 04/25/1967  . Smokeless tobacco: Not on file  . Alcohol Use: 4.2 oz/week    7 Glasses of wine per week     Comment: 2-4 glasses of wine per night, none in 3 weeks  . Drug Use: No  . Sexual Activity: No   Other Topics Concern  . Not on file   Social  History Narrative     Review of Systems: Review of Systems  Constitutional: Negative.  Negative for fever, chills, weight loss, malaise/fatigue and diaphoresis.  HENT: Negative.  Negative for congestion, ear discharge, ear pain, hearing loss, nosebleeds, sore throat and tinnitus.   Eyes: Negative.  Negative for blurred vision, double vision, photophobia, pain, discharge and redness.  Respiratory: Negative.  Negative for cough, hemoptysis, sputum production, shortness of breath, wheezing and stridor.   Cardiovascular: Negative.  Negative for chest pain, palpitations, orthopnea, claudication, leg swelling and PND.  Gastrointestinal: Negative.  Negative for heartburn, nausea, vomiting, abdominal pain, diarrhea, constipation, blood in stool and melena.  Genitourinary: Negative.  Negative for dysuria, urgency, frequency, hematuria and flank pain.  Musculoskeletal: Negative.  Negative for myalgias, back pain, joint pain, falls and neck pain.  Skin: Negative.  Negative for itching and rash.  Neurological: Negative.  Negative for dizziness, tingling, tremors, sensory change, speech change, focal weakness, seizures, loss of consciousness, weakness and headaches.  Endo/Heme/Allergies: Negative.  Negative for environmental allergies and polydipsia. Does not bruise/bleed easily.  Psychiatric/Behavioral: Positive for depression, suicidal ideas and memory loss. Negative for hallucinations and substance abuse. The patient has insomnia. The patient is not nervous/anxious.     Vital Signs: Blood pressure 171/77, pulse 78, temperature 98.4 F (36.9 C), temperature source Oral, resp. rate 18, height 5\' 10"  (1.778 m), weight 71 kg (156 lb 8.4 oz), SpO2 96 %.  Weight trends: Filed Weights   09/20/15 1521  Weight: 71 kg (156 lb 8.4 oz)    Physical Exam: General: NAD, standing in room   Head: Normocephalic, atraumatic. Moist oral mucosal membranes  Eyes: Anicteric, PERRL  Neck: Supple, trachea midline   Lungs:  Clear to auscultation  Heart: Regular rate and rhythm  Abdomen:  Soft, nontender,   Extremities: no peripheral edema.  Neurologic: Nonfocal, moving all four extremities  Skin: No lesions        Lab results: Basic Metabolic Panel:  Recent Labs Lab 09/18/15 0726 09/19/15 1620 09/20/15 1131 09/20/15 1747 09/20/15 2339 09/21/15  0207 09/21/15 0726  NA 127* 124* 123* 123* 122* 124* 124*  K 4.0 4.3  --   --   --  4.2  --   CL 96* 89*  --   --   --  92*  --   CO2 23 27  --   --   --  23  --   GLUCOSE 100* 111*  --   --   --  101*  --   BUN 9 14  --   --   --  15  --   CREATININE 0.77 0.86 0.83 0.76  --  0.74  --   CALCIUM 9.4 9.5  --   --   --  9.0  --     Liver Function Tests:  Recent Labs Lab 09/17/15 1445  AST 26  ALT 25  ALKPHOS 71  BILITOT 0.5  PROT 6.9  ALBUMIN 4.3   No results for input(s): LIPASE, AMYLASE in the last 168 hours. No results for input(s): AMMONIA in the last 168 hours.  CBC:  Recent Labs Lab 09/17/15 1445 09/20/15 1131 09/20/15 1747 09/21/15 0207  WBC 10.4 8.7 9.4 7.6  HGB 13.7 13.7 13.9 12.7*  HCT 38.5* 38.5* 39.0* 36.0*  MCV 90.0 90.3 89.3 88.9  PLT 264 272 286 257    Cardiac Enzymes: No results for input(s): CKTOTAL, CKMB, CKMBINDEX, TROPONINI in the last 168 hours.  BNP: Invalid input(s): POCBNP  CBG: No results for input(s): GLUCAP in the last 168 hours.  Microbiology: No results found for this or any previous visit.  Coagulation Studies: No results for input(s): LABPROT, INR in the last 72 hours.  Urinalysis: No results for input(s): COLORURINE, LABSPEC, PHURINE, GLUCOSEU, HGBUR, BILIRUBINUR, KETONESUR, PROTEINUR, UROBILINOGEN, NITRITE, LEUKOCYTESUR in the last 72 hours.  Invalid input(s): APPERANCEUR    Imaging:  No results found.   Assessment & Plan: Matthew Patrick is a 70 y.o. white male with major depressive disorder, hypertension, hyperlipidemia, generalized anxiety disorder, asthma/COPD,  cataracts who was admitted to [facility] on 09/20/2015 for hyponatremia  1. Hyponatremia: no improvement with IV normal saline. Euvolemic on examination. No urine or serum osm available.  Could be due to SIADH from fluvoxamine. Will discontinue this.  - consult psych for further medication management of major depression disorder.      LOS: 1 Matthew Patrick 5/30/201710:26 AM

## 2015-09-21 NOTE — Progress Notes (Signed)
Patient BP elevated. Has a history of HTN.  No current medications ordered for HTN. MD paged awaiting response.

## 2015-09-21 NOTE — Progress Notes (Signed)
Pt up walking around in the room.  Pt states that he is constipated and needs something for it.  Pt was told that the Dr WILL BE notified and hopefully another for of laxative will be order for his relief.  Dr Ether Griffins was made aware and ordered MOM.  Will monitor for results

## 2015-09-21 NOTE — Progress Notes (Signed)
Initial Nutrition Assessment  DOCUMENTATION CODES:   Not applicable  INTERVENTION:   Cater to pt preferences on Regular Diet Order Recommend Boost Breeze po BID, each supplement provides 250 kcal and 9 grams of protein   NUTRITION DIAGNOSIS:   Inadequate oral intake related to poor appetite as evidenced by per patient/family report.  GOAL:   Patient will meet greater than or equal to 90% of their needs  MONITOR:   PO intake, Supplement acceptance, Diet advancement, Skin, Weight trends, Labs, I & O's  REASON FOR ASSESSMENT:   Malnutrition Screening Tool    ASSESSMENT:   Pt admitted for ECT treatments. Pt found to have hyponatremia and admittted to inpatient. Nephrology consulted.  Past Medical History  Diagnosis Date  . Hypertension   . Hyperlipemia   . Anxiety   . Asthma   . Depression     Diet Order:  Diet regular Room service appropriate?: Yes; Fluid consistency:: Thin   Pt reports eating lunch today or pork roast and breakfast of eggs and bacon this morning. Recorded po intake 100% of all meals since admission.  Pt reports poor appetite for the past couple of weeks. Pt reports trying to eat 3 meals per day but eating less than usual. Pt reports not drinking any supplements PTA.  Medications: reviewed Labs: Na 125, 124, 123  Gastrointestinal Profile: Last BM:  no BM documentation, +constipation   Nutrition-Focused Physical Exam Findings: Nutrition-Focused physical exam completed. Findings are WDL for fat depletion, muscle depletion, and edema.    Weight Change: Pt reports weight of 170lbs 6 months ago (8% weight loss in 6 months)   Skin:  Reviewed, no issues   Height:   Ht Readings from Last 1 Encounters:  09/20/15 5\' 10"  (1.778 m)    Weight:   Wt Readings from Last 1 Encounters:  09/20/15 156 lb 8.4 oz (71 kg)    BMI:  Body mass index is 22.46 kg/(m^2).  Estimated Nutritional Needs:   Kcal:  1775-2130kcals  Protein:  78-91g  protein  Fluid:  >/= 2L fluid  EDUCATION NEEDS:   Education needs no appropriate at this time  Dwyane Luo, RD, LDN Pager [phone] Weekend/On-Call Pager [phone]

## 2015-09-22 ENCOUNTER — Inpatient Hospital Stay (HOSPITAL_COMMUNITY)
Admission: EM | Admit: 2015-09-22 | Discharge: 2015-09-24 | Disposition: A | Discharge status: Home / Self Care | Payer: Medicare Other | Source: Intra-hospital | Attending: Psychiatry | Admitting: Psychiatry

## 2015-09-22 ENCOUNTER — Telehealth: Payer: Self-pay | Admitting: Pulmonary Disease

## 2015-09-22 DIAGNOSIS — I10 Essential (primary) hypertension: Secondary | ICD-10-CM

## 2015-09-22 DIAGNOSIS — G4733 Obstructive sleep apnea (adult) (pediatric): Secondary | ICD-10-CM

## 2015-09-22 DIAGNOSIS — Z818 Family history of other mental and behavioral disorders: Secondary | ICD-10-CM

## 2015-09-22 DIAGNOSIS — F429 Obsessive-compulsive disorder, unspecified: Secondary | ICD-10-CM | POA: Diagnosis present

## 2015-09-22 DIAGNOSIS — Z7982 Long term (current) use of aspirin: Secondary | ICD-10-CM

## 2015-09-22 DIAGNOSIS — E222 Syndrome of inappropriate secretion of antidiuretic hormone: Secondary | ICD-10-CM | POA: Diagnosis present

## 2015-09-22 DIAGNOSIS — Z9849 Cataract extraction status, unspecified eye: Secondary | ICD-10-CM

## 2015-09-22 DIAGNOSIS — K5900 Constipation, unspecified: Secondary | ICD-10-CM

## 2015-09-22 DIAGNOSIS — Z8249 Family history of ischemic heart disease and other diseases of the circulatory system: Secondary | ICD-10-CM

## 2015-09-22 DIAGNOSIS — Z79899 Other long term (current) drug therapy: Secondary | ICD-10-CM

## 2015-09-22 DIAGNOSIS — Z87891 Personal history of nicotine dependence: Secondary | ICD-10-CM

## 2015-09-22 DIAGNOSIS — R45851 Suicidal ideations: Secondary | ICD-10-CM | POA: Diagnosis present

## 2015-09-22 DIAGNOSIS — J449 Chronic obstructive pulmonary disease, unspecified: Secondary | ICD-10-CM

## 2015-09-22 DIAGNOSIS — Z825 Family history of asthma and other chronic lower respiratory diseases: Secondary | ICD-10-CM

## 2015-09-22 DIAGNOSIS — E871 Hypo-osmolality and hyponatremia: Secondary | ICD-10-CM | POA: Diagnosis present

## 2015-09-22 DIAGNOSIS — F332 Major depressive disorder, recurrent severe without psychotic features: Secondary | ICD-10-CM | POA: Diagnosis present

## 2015-09-22 DIAGNOSIS — Z9889 Other specified postprocedural states: Secondary | ICD-10-CM

## 2015-09-22 DIAGNOSIS — F411 Generalized anxiety disorder: Secondary | ICD-10-CM | POA: Diagnosis present

## 2015-09-22 DIAGNOSIS — R531 Weakness: Secondary | ICD-10-CM

## 2015-09-22 DIAGNOSIS — D649 Anemia, unspecified: Secondary | ICD-10-CM

## 2015-09-22 DIAGNOSIS — F32A Depression, unspecified: Secondary | ICD-10-CM

## 2015-09-22 DIAGNOSIS — Z7951 Long term (current) use of inhaled steroids: Secondary | ICD-10-CM

## 2015-09-22 DIAGNOSIS — F329 Major depressive disorder, single episode, unspecified: Secondary | ICD-10-CM

## 2015-09-22 LAB — BASIC METABOLIC PANEL
Anion gap: 9 (ref 5–15)
BUN: 23 mg/dL — AB (ref 6–20)
CO2: 26 mmol/L (ref 22–32)
CREATININE: 0.86 mg/dL (ref 0.61–1.24)
Calcium: 9.4 mg/dL (ref 8.9–10.3)
Chloride: 93 mmol/L — ABNORMAL LOW (ref 101–111)
GFR calc Af Amer: >60 mL/min (ref >60)
Glucose, Bld: 84 mg/dL (ref 65–99)
Potassium: 4 mmol/L (ref 3.5–5.1)
SODIUM: 128 mmol/L — AB (ref 135–145)

## 2015-09-22 LAB — SODIUM
Sodium: 128 mmol/L — ABNORMAL LOW (ref 135–145)
Sodium: 128 mmol/L — ABNORMAL LOW (ref 135–145)

## 2015-09-22 LAB — HEMOGLOBIN: HEMOGLOBIN: 13.6 g/dL (ref 13.0–18.0)

## 2015-09-22 MED ORDER — BOOST / RESOURCE BREEZE PO LIQD
1.0000 | Freq: Two times a day (BID) | ORAL | Status: DC
Start: 2015-09-23 — End: 2015-09-24
  Administered 2015-09-23 (×2): 1 via ORAL

## 2015-09-22 MED ORDER — LORAZEPAM 0.5 MG PO TABS
0.5000 mg | ORAL_TABLET | Freq: Once | ORAL | Status: AC
  Administered 2015-09-22: 0.5 mg via ORAL
  Filled 2015-09-22: qty 1

## 2015-09-22 MED ORDER — LORAZEPAM 0.5 MG PO TABS: 0.2500 mg | ORAL_TABLET | Freq: Four times a day (QID) | ORAL | Status: DC

## 2015-09-22 MED ORDER — MAGNESIUM HYDROXIDE 400 MG/5ML PO SUSP: 30.0000 mL | Freq: Every day | ORAL | Status: DC | PRN

## 2015-09-22 MED ORDER — QUETIAPINE FUMARATE 25 MG PO TABS
150.0000 mg | ORAL_TABLET | Freq: Every day | ORAL | Status: DC
  Administered 2015-09-22 – 2015-09-23 (×2): 150 mg via ORAL
  Filled 2015-09-22 (×2): qty 2

## 2015-09-22 MED ORDER — ONDANSETRON HCL 4 MG/2ML IJ SOLN: 4.0000 mg | Freq: Four times a day (QID) | INTRAMUSCULAR | Status: DC | PRN

## 2015-09-22 MED ORDER — LORAZEPAM 0.5 MG PO TABS
0.2500 mg | ORAL_TABLET | Freq: Four times a day (QID) | ORAL | Status: DC
  Administered 2015-09-22 – 2015-09-24 (×7): 0.25 mg via ORAL
  Filled 2015-09-22 (×7): qty 1

## 2015-09-22 MED ORDER — CLONIDINE HCL 0.1 MG PO TABS
0.1000 mg | ORAL_TABLET | Freq: Four times a day (QID) | ORAL | Status: DC | PRN
  Administered 2015-09-23: 0.1 mg via ORAL
  Filled 2015-09-22: qty 1

## 2015-09-22 MED ORDER — TIOTROPIUM BROMIDE MONOHYDRATE 18 MCG IN CAPS
18.0000 ug | ORAL_CAPSULE | Freq: Every day | RESPIRATORY_TRACT | Status: DC
  Administered 2015-09-23 – 2015-09-24 (×2): 18 ug via RESPIRATORY_TRACT
  Filled 2015-09-22: qty 5

## 2015-09-22 MED ORDER — SENNOSIDES-DOCUSATE SODIUM 8.6-50 MG PO TABS
1.0000 | ORAL_TABLET | Freq: Every evening | ORAL | Status: DC | PRN
  Filled 2015-09-22: qty 1

## 2015-09-22 MED ORDER — ACETAMINOPHEN 325 MG PO TABS: 650.0000 mg | ORAL_TABLET | Freq: Four times a day (QID) | ORAL | Status: DC | PRN

## 2015-09-22 MED ORDER — ENOXAPARIN SODIUM 40 MG/0.4ML ~~LOC~~ SOLN
40.0000 mg | SUBCUTANEOUS | Status: DC
  Administered 2015-09-22 – 2015-09-23 (×2): 40 mg via SUBCUTANEOUS
  Filled 2015-09-22 (×3): qty 0.4

## 2015-09-22 MED ORDER — ACETAMINOPHEN 650 MG RE SUPP
650.0000 mg | Freq: Four times a day (QID) | RECTAL | Status: DC | PRN
  Filled 2015-09-22: qty 1

## 2015-09-22 MED ORDER — BOOST / RESOURCE BREEZE PO LIQD: 1.0000 | Freq: Two times a day (BID) | ORAL | Status: DC

## 2015-09-22 MED ORDER — ONDANSETRON HCL 4 MG PO TABS: 4.0000 mg | ORAL_TABLET | Freq: Four times a day (QID) | ORAL | Status: DC | PRN

## 2015-09-22 MED ORDER — ALUM & MAG HYDROXIDE-SIMETH 200-200-20 MG/5ML PO SUSP: 30.0000 mL | ORAL | Status: DC | PRN

## 2015-09-22 NOTE — Progress Notes (Signed)
Visited patient's room to give the wife information on ECT. She was was not there. Present was the patient and a Actuary. When this nurse explained the nature of the visit the patient proceeded to ask questions about the procedure. Education was provided to the patient regarding the ECT procedure. The patient seemed satisfied with this nurses answers to his questions and verbalized understanding of the education. The booklet on the ECT procedure was left on the patient table and it was requested that the information would be given to the nurse.

## 2015-09-22 NOTE — Plan of Care (Signed)
Problem: Self-Concept: Goal: Ability to disclose and discuss suicidal ideas will improve Outcome: Not Progressing Denies SI but states that he is still very depressed.

## 2015-09-22 NOTE — BHH Suicide Risk Assessment (Signed)
[facility] Admission Suicide Risk Assessment   Nursing information obtained from:    Demographic factors:    Current Mental Status:    Loss Factors:    Historical Factors:    Risk Reduction Factors:     Total Time spent with patient: 1 hour Principal Problem: Severe recurrent major depression without psychotic features [facility]) Diagnosis:   Patient Active Problem List   Diagnosis Date Noted  . Generalized weakness [R53.1] 09/22/2015  . Depression [F32.9] 09/22/2015  . Anemia [D64.9] 09/22/2015  . Obsessive compulsive disorder [F42.9] 09/22/2015  . Generalized anxiety disorder [F41.1] 09/22/2015  . SIADH (syndrome of inappropriate ADH production) ([place]) [E22.2] 09/21/2015  . Hyponatremia [E87.1] 09/20/2015  . Severe recurrent major depression with psychotic features ([place]) [F33.3] 09/19/2015  . Severe recurrent major depression without psychotic features ([place]) [F33.2] 09/17/2015  . Suicidal ideation [R45.851] 09/17/2015  . Cough [R05] 01/19/2015  . Restless leg [G25.81] 01/19/2015  . COLD (chronic obstructive lung disease) ([place]) [J44.9] 05/22/2014  . Lung nodule < 6cm on CT [R91.1] 05/22/2014  . OSA (obstructive sleep apnea) [G47.33] 07/11/2013  . Lung nodule [R91.1] 05/11/2013  . Nocturnal hypoxemia [G47.34] 05/11/2013  . Obstructive lung disease ([place]) [J44.9] 04/17/2013  . Chronic cough [R05] 04/17/2013  . Syncope [R55] 03/25/2011  . Hypertension [I10] 03/25/2011  . Hypokalemia [E87.6] 03/25/2011  . Asthma [J45.909] 03/25/2011  . Sciatica [M54.30] 03/25/2011  . SOB (shortness of breath) [R06.02] 03/25/2011   Subjective Data: Patient is currently denying suicidal intent or plan although he still feels very nervous and anxious and has some worries about his own behavior. Very depressed and anxious. Currently not psychotic and not acting out in an agitated manner.  Continued Clinical Symptoms:  Alcohol Use Disorder Identification Test Final Score (AUDIT): 0 The "Alcohol Use Disorders  Identification Test", Guidelines for Use in Primary Care, Second Edition.  World Pharmacologist [facility]). Score between 0-7:  no or low risk or alcohol related problems. Score between 8-15:  moderate risk of alcohol related problems. Score between 16-19:  high risk of alcohol related problems. Score 20 or above:  warrants further diagnostic evaluation for alcohol dependence and treatment.   CLINICAL FACTORS:   Severe Anxiety and/or Agitation Depression:   Severe   Musculoskeletal: Strength & Muscle Tone: within normal limits Gait & Station: normal Patient leans: N/A  Psychiatric Specialty Exam: Physical Exam  Nursing note and vitals reviewed. Constitutional: He appears well-developed and well-nourished.  HENT:  Head: Normocephalic and atraumatic.  Eyes: Conjunctivae are normal. Pupils are equal, round, and reactive to light.  Neck: Normal range of motion.  Cardiovascular: Regular rhythm and normal heart sounds.   Respiratory: Effort normal. No respiratory distress.  GI: Soft.  Musculoskeletal: Normal range of motion.  Neurological: He is alert.  Skin: Skin is warm and dry.  Psychiatric: His mood appears anxious. His speech is delayed. He is slowed. He exhibits a depressed mood.  Patient is extremely anxious somewhat withdrawn but not evidently delusional. Denies acute suicidal intent or plan.    Review of Systems  Constitutional: Negative.   HENT: Negative.   Eyes: Negative.   Respiratory: Negative.   Cardiovascular: Negative.   Gastrointestinal: Negative.   Musculoskeletal: Negative.   Skin: Negative.   Neurological: Negative.   Psychiatric/Behavioral: Positive for depression. Negative for suicidal ideas, hallucinations, memory loss and substance abuse. The patient is nervous/anxious and has insomnia.     Blood pressure 157/73, pulse 75, temperature 98.3 F (36.8 C), temperature source Oral, resp. rate 18, height  5\' 10"  (1.778 m), weight 73.483 kg (162 lb), SpO2 100  %.Body mass index is 23.24 kg/(m^2).  General Appearance: Fairly Groomed  Eye Contact:  Fair  Speech:  Slow  Volume:  Decreased  Mood:  Anxious  Affect:  Blunt  Thought Process:  Goal Directed  Orientation:  Full (Time, Place, and Person)  Thought Content:  Logical  Suicidal Thoughts:  Yes.  without intent/plan  Homicidal Thoughts:  No  Memory:  Immediate;   Good Recent;   Fair Remote;   Fair  Judgement:  Fair  Insight:  Fair  Psychomotor Activity:  Decreased  Concentration:  Concentration: Fair  Recall:  [employer] of Knowledge:  Fair  Language:  Fair  Akathisia:  No  Handed:  Right  AIMS (if indicated):     Assets:  Communication Skills Desire for Improvement Financial Resources/Insurance Housing Physical Health Resilience  ADL's:  Intact  Cognition:  WNL  Sleep:         COGNITIVE FEATURES THAT CONTRIBUTE TO RISK:  Polarized thinking    SUICIDE RISK:   Mild:  Suicidal ideation of limited frequency, intensity, duration, and specificity.  There are no identifiable plans, no associated intent, mild dysphoria and related symptoms, good self-control (both objective and subjective assessment), few other risk factors, and identifiable protective factors, including available and accessible social support.  PLAN OF CARE: Patient is being readmitted to the psychiatry ward. 15 minute observation. Continue current medicine. ECT scheduled to begin on Friday. Patient is agreeable to the current plan. Daily assessment of suicidality and dangerousness.  I certify that inpatient services furnished can reasonably be expected to improve the patient's condition.   Alethia Berthold, MD 09/22/2015, 6:25 PM

## 2015-09-22 NOTE — Consult Note (Signed)
  Psychiatry: Patient who was admitted to psychiatry service on Friday but transferred to medicine because of hyponatremia. Hyponatremia improving. Spoke with hospitalist today. Patient will be transferred back down to the psychiatric ward. Full note to follow on admission later today. TTS aware.

## 2015-09-22 NOTE — BH Assessment (Signed)
Patient has been accepted to [facility].  Accepting physician is Dr. Weber Cooks.  Attending Physician will be Dr. Weber Cooks.  Patient has been assigned to room 304, by [place] Charge Nurse Jake Church.  Call report to [phone].   1A Orthopedic Staff Lambert Keto, RN) made aware of admission.  Patient bed will be available anytime after 4pm.

## 2015-09-22 NOTE — Progress Notes (Signed)
[facility] NAME: Matthew Patrick    MR#:  [id number]  DATE OF BIRTH:  May 09, 1945  SUBJECTIVE:  CHIEF COMPLAINT:  No chief complaint on file. Patient is a 70 year old male with past medical history significant for history of severe depression requiring ECT who has been in psychiatric unit for past few days, was noted to have decreasing sodium levels, hospitalist services were consulted and patient was transferred to medicine. Patient still complains of some weakness, dizziness, but most of his concern is his depression. His sodium level has improved to 128 today withholding Luvox and restricting fluid intake to suspected SIADH due to Luvox.  Review of Systems  Constitutional: Positive for malaise/fatigue. Negative for fever, chills and weight loss.  HENT: Negative for congestion.   Eyes: Negative for blurred vision and double vision.  Respiratory: Negative for cough, sputum production, shortness of breath and wheezing.   Cardiovascular: Negative for chest pain, palpitations, orthopnea, leg swelling and PND.  Gastrointestinal: Negative for nausea, vomiting, abdominal pain, diarrhea, constipation and blood in stool.  Genitourinary: Negative for dysuria, urgency, frequency and hematuria.  Musculoskeletal: Negative for falls.  Neurological: Positive for dizziness, sensory change and weakness. Negative for tremors, focal weakness and headaches.  Endo/Heme/Allergies: Does not bruise/bleed easily.  Psychiatric/Behavioral: Negative for depression. The patient does not have insomnia.     VITAL SIGNS: Blood pressure 140/72, pulse 82, temperature 98.2 F (36.8 C), temperature source Oral, resp. rate 16, height 5\' 10"  (1.778 m), weight 71 kg (156 lb 8.4 oz), SpO2 98 %.  PHYSICAL EXAMINATION:   GENERAL:  70 y.o.-year-old patient lying in the bed with no acute distress. Tremulous, uncomfortable, somewhat unsteady on his feet, diaphoretic  hands EYES: Pupils equal, round, reactive to light and accommodation. No scleral icterus. Extraocular muscles intact.  HEENT: Head atraumatic, normocephalic. Oropharynx and nasopharynx clear.  NECK:  Supple, no jugular venous distention. No thyroid enlargement, no tenderness.  LUNGS: Normal breath sounds bilaterally, no wheezing, rales,rhonchi or crepitation. No use of accessory muscles of respiration.  CARDIOVASCULAR: S1, S2 normal. No murmurs, rubs, or gallops.  ABDOMEN: Soft, nontender, nondistended. Bowel sounds present. No organomegaly or mass.  EXTREMITIES: No pedal edema, cyanosis, or clubbing.  NEUROLOGIC: Cranial nerves II through XII are intact. Muscle strength 5/5 in all extremities. Sensation intact. Gait not checked.  PSYCHIATRIC: The patient is alert and oriented x 3.  SKIN: No obvious rash, lesion, or ulcer.   ORDERS/RESULTS REVIEWED:   CBC  Recent Labs Lab 09/17/15 1445 09/20/15 1131 09/20/15 1747 09/21/15 0207 09/22/15 0152  WBC 10.4 8.7 9.4 7.6  --   HGB 13.7 13.7 13.9 12.7* 13.6  HCT 38.5* 38.5* 39.0* 36.0*  --   PLT 264 272 286 257  --   MCV 90.0 90.3 89.3 88.9  --   MCH 32.1 32.1 31.8 31.3  --   MCHC 35.7 35.5 35.6 35.2  --   RDW 11.8 12.0 12.0 12.1  --    ------------------------------------------------------------------------------------------------------------------  Chemistries   Recent Labs Lab 09/17/15 1445 09/18/15 0726 09/19/15 1620 09/20/15 1131 09/20/15 1747  09/21/15 0207 09/21/15 0726 09/21/15 1257 09/21/15 1914 09/22/15 0152 09/22/15 0813  NA 126* 127* 124* 123* 123*  < > 124* 124* 125* 123* 128* 128*  K 4.1 4.0 4.3  --   --   --  4.2  --   --   --  4.0  --   CL 93* 96* 89*  --   --   --  92*  --   --   --  93*  --   CO2 25 23 27   --   --   --  23  --   --   --  26  --   GLUCOSE 101* 100* 111*  --   --   --  101*  --   --   --  84  --   BUN 9 9 14   --   --   --  15  --   --   --  23*  --   CREATININE 0.92 0.77 0.86 0.83 0.76  --   0.74  --   --   --  0.86  --   CALCIUM 9.4 9.4 9.5  --   --   --  9.0  --   --   --  9.4  --   AST 26  --   --   --   --   --   --   --   --   --   --   --   ALT 25  --   --   --   --   --   --   --   --   --   --   --   ALKPHOS 71  --   --   --   --   --   --   --   --   --   --   --   BILITOT 0.5  --   --   --   --   --   --   --   --   --   --   --   < > = values in this interval not displayed. ------------------------------------------------------------------------------------------------------------------ estimated creatinine clearance is 80.3 mL/min (by C-G formula based on Cr of 0.86). ------------------------------------------------------------------------------------------------------------------ No results for input(s): TSH, T4TOTAL, T3FREE, THYROIDAB in the last 72 hours.  Invalid input(s): FREET3  Cardiac Enzymes No results for input(s): CKMB, TROPONINI, MYOGLOBIN in the last 168 hours.  Invalid input(s): CK ------------------------------------------------------------------------------------------------------------------ Invalid input(s): POCBNP ---------------------------------------------------------------------------------------------------------------  RADIOLOGY: No results found.  EKG:  Orders placed or performed during the hospital encounter of 09/17/15  . EKG 12-Lead  . EKG 12-Lead    ASSESSMENT AND PLAN:  Active Problems:   Hyponatremia   SIADH (syndrome of inappropriate ADH production) ([facility]) #1. Hyponatremia due to SIADH, due to Luvox, now discontinued,  following serial Na levels, transferred to psychiatrist care. Discussed with nephrologist, who both agree that patient can undergo ECT therapy with his sodium is above 130. We recommend to continue fluid restrictions. #2, lightheadedness, dizziness, weakness, possibly related to underlying depression and hyponatremia, awaiting for physical therapist input #3 anemia, getting Hemoccult as able, hemoglobin  level is stable and actually improved from yesterday. #4. Depression, psychiatrist will need to work with medications since Luvox , likely caused SIADH/hyponatremia   Management plans discussed with the patient, family and they are in agreement. Discussed with Dr. Weber Cooks  DRUG ALLERGIES: No Known Allergies  CODE STATUS:     Code Status Orders        Start     Ordered   09/20/15 1306  Full code   Continuous     09/20/15 1307    Code Status History    Date Active Date Inactive Code Status Order ID Comments User Context   09/17/2015 11:13 PM 09/20/2015  1:07 PM Full Code [id number]  Gonzella Lex, MD Inpatient  TOTAL TIME TAKING CARE OF THIS PATIENT: 40 minutes.  Discussed with Dr. Juleen China and Dr Merleen Milliner M.D on 09/22/2015 at 11:14 AM  Between 7am to 6pm - Pager - [phone]  After 6pm go to [email or web address] - password [email or web address] [facility]  Office  [phone]  CC: Primary care physician; Tivis Ringer, MD

## 2015-09-22 NOTE — Tx Team (Addendum)
Initial Interdisciplinary Treatment Plan   PATIENT STRESSORS: Medication change or noncompliance   PATIENT STRENGTHS: Supportive family/friends  Some insight   PROBLEM LIST: Problem List/Patient Goals Date to be addressed Date deferred Reason deferred Estimated date of resolution  Anxiety      Depression                                                 DISCHARGE CRITERIA:  Improved stabilization in mood, thinking, and/or behavior Verbal commitment to aftercare and medication compliance  PRELIMINARY DISCHARGE PLAN: Outpatient therapy Return to previous living arrangement  PATIENT/FAMIILY INVOLVEMENT: This treatment plan has been presented to and reviewed with the patient, Matthew Patrick.  The patient and family have been given the opportunity to ask questions and make suggestions.  Matthew Patrick W Aldwin Micalizzi 09/22/2015, 5:56 PM

## 2015-09-22 NOTE — Telephone Encounter (Signed)
LM for pt x 1  

## 2015-09-22 NOTE — Discharge Summary (Signed)
Matthew Patrick NAME: Matthew Patrick    MR#:  [id number]  DATE OF BIRTH:  1945-09-18  DATE OF ADMISSION:  09/20/2015 ADMITTING PHYSICIAN: Bettey Costa, MD  DATE OF DISCHARGE: No discharge date for patient encounter.  PRIMARY CARE PHYSICIAN: Tivis Ringer, MD     ADMISSION DIAGNOSIS:  hyponatremia  DISCHARGE DIAGNOSIS:  Principal Problem:   Hyponatremia Active Problems:   SIADH (syndrome of inappropriate ADH production) ([facility])   Depression   Generalized weakness   Anemia   SECONDARY DIAGNOSIS:   Past Medical History  Diagnosis Date  . Hypertension   . Hyperlipemia   . Anxiety   . Asthma   . Depression     .pro HOSPITAL COURSE:  Patient is a 70 year old male with past medical history significant for history of severe depression requiring ECT who has been in psychiatric unit for past few days, was noted to have decreasing sodium levels, hospitalist services were consulted and patient was transferred to medicine. Patient still complains of some weakness, dizziness, but most of his concern is his depression. His sodium level has improved to 128 today withholding Luvox and restricting fluid intake to suspected SIADH due to Luvox Discussion by problem #1. Hyponatremia due to SIADH, due to Luvox, now discontinued, following serial Na levels, transfer to psychiatrist care. Discussed with nephrologist, who both agree that patient can undergo ECT therapy when his sodium is above 130. We recommend to continue fluid restrictions at 1200-1500 cc in 24 hours. #2, lightheadedness, dizziness, weakness, possibly related to underlying depression and hyponatremia,  patient is able to ambulate and take care of himself #3 anemia, likely hydration related, getting Hemoccult as able, hemoglobin level is stable and actually improved from yesterday. #4. Depression, psychiatrist will need to work with medications since Luvox , likely caused  SIADH/hyponatremia   DISCHARGE CONDITIONS:   Stable  CONSULTS OBTAINED:  Treatment Team:  Anthonette Legato, MD Gonzella Lex, MD  DRUG ALLERGIES:  No Known Allergies  DISCHARGE MEDICATIONS:   Current Discharge Medication List    START taking these medications   Details  feeding supplement (BOOST / RESOURCE BREEZE) LIQD Take 1 Container by mouth 2 (two) times daily between meals. Qty: 60 Container, Refills: 5      CONTINUE these medications which have CHANGED   Details  LORazepam (ATIVAN) 0.5 MG tablet Take 0.5 tablets (0.25 mg total) by mouth 4 (four) times daily. Qty: 30 tablet, Refills: 0      CONTINUE these medications which have NOT CHANGED   Details  aspirin 81 MG tablet Take 81 mg by mouth daily.    fluticasone (FLONASE) 50 MCG/ACT nasal spray Place 2 sprays into both nostrils daily.    gabapentin (NEURONTIN) 300 MG capsule Take 300 mg by mouth at bedtime.    QUEtiapine (SEROQUEL) 100 MG tablet Take 150 mg by mouth at bedtime.         DISCHARGE INSTRUCTIONS:    Patient is to follow-up with primary care physician as outpatient  If you experience worsening of your admission symptoms, develop shortness of breath, life threatening emergency, suicidal or homicidal thoughts you must seek medical attention immediately by calling 911 or calling your MD immediately  if symptoms less severe.  You Must read complete instructions/literature along with all the possible adverse reactions/side effects for all the Medicines you take and that have been prescribed to you. Take any new Medicines after you have completely understood and accept all the  possible adverse reactions/side effects.   Please note  You were cared for by a hospitalist during your hospital stay. If you have any questions about your discharge medications or the care you received while you were in the hospital after you are discharged, you can call the unit and asked to speak with the hospitalist on call  if the hospitalist that took care of you is not available. Once you are discharged, your primary care physician will handle any further medical issues. Please note that NO REFILLS for any discharge medications will be authorized once you are discharged, as it is imperative that you return to your primary care physician (or establish a relationship with a primary care physician if you do not have one) for your aftercare needs so that they can reassess your need for medications and monitor your lab values.    Today   CHIEF COMPLAINT:  No chief complaint on file.   HISTORY OF PRESENT ILLNESS:  Matthew Patrick  is a 70 y.o. male with a known history of severe depression requiring ECT who has been in psychiatric unit for past few days, was noted to have decreasing sodium levels, hospitalist services were consulted and patient was transferred to medicine. Patient still complains of some weakness, dizziness, but most of his concern is his depression. His sodium level has improved to 128 today withholding Luvox and restricting fluid intake to suspected SIADH due to Luvox Discussion by problem #1. Hyponatremia due to SIADH, due to Luvox, now discontinued, following serial Na levels, transfer to psychiatrist care. Discussed with nephrologist, who both agree that patient can undergo ECT therapy when his sodium is above 130. We recommend to continue fluid restrictions at 1200-1500 cc in 24 hours. #2, lightheadedness, dizziness, weakness, possibly related to underlying depression and hyponatremia,  patient is able to ambulate and take care of himself #3 anemia, likely hydration related, getting Hemoccult as able, hemoglobin level is stable and actually improved from yesterday. #4. Depression, psychiatrist will need to work with medications since Luvox , likely caused SIADH/hyponatremia     VITAL SIGNS:  Blood pressure 140/72, pulse 82, temperature 98.2 F (36.8 C), temperature source Oral, resp. rate 16,  height 5\' 10"  (1.778 m), weight 71 kg (156 lb 8.4 oz), SpO2 98 %.  I/O:   Intake/Output Summary (Last 24 hours) at 09/22/15 1124 Last data filed at 09/22/15 1023  Gross per 24 hour  Intake   1628 ml  Output    350 ml  Net   1278 ml    PHYSICAL EXAMINATION:  GENERAL:  70 y.o.-year-old patient lying in the bed with no acute distress.  EYES: Pupils equal, round, reactive to light and accommodation. No scleral icterus. Extraocular muscles intact.  HEENT: Head atraumatic, normocephalic. Oropharynx and nasopharynx clear.  NECK:  Supple, no jugular venous distention. No thyroid enlargement, no tenderness.  LUNGS: Normal breath sounds bilaterally, no wheezing, rales,rhonchi or crepitation. No use of accessory muscles of respiration.  CARDIOVASCULAR: S1, S2 normal. No murmurs, rubs, or gallops.  ABDOMEN: Soft, non-tender, non-distended. Bowel sounds present. No organomegaly or mass.  EXTREMITIES: No pedal edema, cyanosis, or clubbing.  NEUROLOGIC: Cranial nerves II through XII are intact. Muscle strength 5/5 in all extremities. Sensation intact. Gait not checked.  PSYCHIATRIC: The patient is alert and oriented x 3.  SKIN: No obvious rash, lesion, or ulcer.   DATA REVIEW:   CBC  Recent Labs Lab 09/21/15 0207 09/22/15 0152  WBC 7.6  --   HGB 12.7* 13.6  HCT 36.0*  --   PLT 257  --     Chemistries   Recent Labs Lab 09/17/15 1445  09/22/15 0152 09/22/15 0813  NA 126*  < > 128* 128*  K 4.1  < > 4.0  --   CL 93*  < > 93*  --   CO2 25  < > 26  --   GLUCOSE 101*  < > 84  --   BUN 9  < > 23*  --   CREATININE 0.92  < > 0.86  --   CALCIUM 9.4  < > 9.4  --   AST 26  --   --   --   ALT 25  --   --   --   ALKPHOS 71  --   --   --   BILITOT 0.5  --   --   --   < > = values in this interval not displayed.  Cardiac Enzymes No results for input(s): TROPONINI in the last 168 hours.  Microbiology Results  No results found for this or any previous visit.  RADIOLOGY:  No results  found.  EKG:   Orders placed or performed during the hospital encounter of 09/17/15  . EKG 12-Lead  . EKG 12-Lead      Management plans discussed with the patient, family and they are in agreement.  CODE STATUS:     Code Status Orders        Start     Ordered   09/20/15 1306  Full code   Continuous     09/20/15 1307    Code Status History    Date Active Date Inactive Code Status Order ID Comments User Context   09/17/2015 11:13 PM 09/20/2015  1:07 PM Full Code [id number]  Gonzella Lex, MD Inpatient      TOTAL TIME TAKING CARE OF THIS PATIENT: 40 minutes.    Theodoro Grist M.D on 09/22/2015 at 11:24 AM  Between 7am to 6pm - Pager - [phone]  After 6pm go to [email or web address] - password [email or web address] [facility]  Office  [phone]  CC: Primary care physician; Tivis Ringer, MD

## 2015-09-22 NOTE — Progress Notes (Signed)
Pt has become very anxious with knowledge of being discharged to behavorial health. Dr Ether Griffins updated orders received medication given.

## 2015-09-22 NOTE — Progress Notes (Signed)
[facility]  ROUNDING NOTE   Subjective:   Wife at bedside. Sitter at bedside.   Na 128  Objective:  Vital signs in last 24 hours:  Temp:  [97.7 F (36.5 C)-98.3 F (36.8 C)] 98.2 F (36.8 C) (05/31 0755) Pulse Rate:  [74-112] 82 (05/31 0755) Resp:  [16-20] 16 (05/31 0755) BP: (140-181)/(71-86) 140/72 mmHg (05/31 0755) SpO2:  [94 %-98 %] 98 % (05/31 0755)  Weight change:  Filed Weights   09/20/15 1521  Weight: 71 kg (156 lb 8.4 oz)    Intake/Output: I/O last 3 completed shifts: In: 2292 [P.O.:1267; I.V.:1025] Out: 500 [Urine:500]   Intake/Output this shift:  Total I/O In: 851 [P.O.:851] Out: -   Physical Exam: General: NAD,   Head: Normocephalic, atraumatic. Moist oral mucosal membranes  Eyes: Anicteric, PERRL  Neck: Supple, trachea midline  Lungs:  Clear to auscultation  Heart: Regular rate and rhythm  Abdomen:  Soft, nontender,   Extremities: no peripheral edema.  Neurologic: Nonfocal, moving all four extremities  Skin: No lesions       Basic Metabolic Panel:  Recent Labs Lab 09/17/15 1445 09/18/15 0726 09/19/15 1620 09/20/15 1131 09/20/15 1747  09/21/15 0207 09/21/15 0726 09/21/15 1257 09/21/15 1914 09/22/15 0152 09/22/15 0813  NA 126* 127* 124* 123* 123*  < > 124* 124* 125* 123* 128* 128*  K 4.1 4.0 4.3  --   --   --  4.2  --   --   --  4.0  --   CL 93* 96* 89*  --   --   --  92*  --   --   --  93*  --   CO2 25 23 27   --   --   --  23  --   --   --  26  --   GLUCOSE 101* 100* 111*  --   --   --  101*  --   --   --  84  --   BUN 9 9 14   --   --   --  15  --   --   --  23*  --   CREATININE 0.92 0.77 0.86 0.83 0.76  --  0.74  --   --   --  0.86  --   CALCIUM 9.4 9.4 9.5  --   --   --  9.0  --   --   --  9.4  --   < > = values in this interval not displayed.  Liver Function Tests:  Recent Labs Lab 09/17/15 1445  AST 26  ALT 25  ALKPHOS 71  BILITOT 0.5  PROT 6.9  ALBUMIN 4.3   No results for input(s): LIPASE, AMYLASE  in the last 168 hours. No results for input(s): AMMONIA in the last 168 hours.  CBC:  Recent Labs Lab 09/17/15 1445 09/20/15 1131 09/20/15 1747 09/21/15 0207 09/22/15 0152  WBC 10.4 8.7 9.4 7.6  --   HGB 13.7 13.7 13.9 12.7* 13.6  HCT 38.5* 38.5* 39.0* 36.0*  --   MCV 90.0 90.3 89.3 88.9  --   PLT 264 272 286 257  --     Cardiac Enzymes: No results for input(s): CKTOTAL, CKMB, CKMBINDEX, TROPONINI in the last 168 hours.  BNP: Invalid input(s): POCBNP  CBG: No results for input(s): GLUCAP in the last 168 hours.  Microbiology: No results found for this or any previous visit.  Coagulation Studies: No results for input(s): LABPROT, INR in  the last 72 hours.  Urinalysis: No results for input(s): COLORURINE, LABSPEC, PHURINE, GLUCOSEU, HGBUR, BILIRUBINUR, KETONESUR, PROTEINUR, UROBILINOGEN, NITRITE, LEUKOCYTESUR in the last 72 hours.  Invalid input(s): APPERANCEUR    Imaging: No results found.   Medications:     . enoxaparin (LOVENOX) injection  40 mg Subcutaneous Q24H  . feeding supplement  1 Container Oral BID BM  . LORazepam  0.25 mg Oral QID  . QUEtiapine  150 mg Oral QHS  . tiotropium  18 mcg Inhalation Daily   acetaminophen **OR** acetaminophen, cloNIDine, ondansetron **OR** ondansetron (ZOFRAN) IV, senna-docusate  Assessment/ Plan:  Mr. Voss Kesselring is a 70 y.o. white male with major depressive disorder, hypertension, hyperlipidemia, generalized anxiety disorder, asthma/COPD, cataracts who was admitted to [facility] on 09/20/2015 for hyponatremia  1. Hyponatremia:  Could be due to SIADH from fluvoxamine. Now improving. Continue to monitor.    LOS: 2 Matthew Patrick 5/31/201711:32 AM

## 2015-09-22 NOTE — H&P (Signed)
Psychiatric Admission Assessment Adult  Patient Identification: Matthew Patrick MRN:  [id number] Date of Evaluation:  09/22/2015 Chief Complaint:  Depression Principal Diagnosis: Severe recurrent major depression without psychotic features [facility]) Diagnosis:   Patient Active Problem List   Diagnosis Date Noted  . Generalized weakness [R53.1] 09/22/2015  . Depression [F32.9] 09/22/2015  . Anemia [D64.9] 09/22/2015  . Obsessive compulsive disorder [F42.9] 09/22/2015  . Generalized anxiety disorder [F41.1] 09/22/2015  . SIADH (syndrome of inappropriate ADH production) ([place]) [E22.2] 09/21/2015  . Hyponatremia [E87.1] 09/20/2015  . Severe recurrent major depression with psychotic features ([place]) [F33.3] 09/19/2015  . Severe recurrent major depression without psychotic features ([place]) [F33.2] 09/17/2015  . Suicidal ideation [R45.851] 09/17/2015  . Cough [R05] 01/19/2015  . Restless leg [G25.81] 01/19/2015  . COLD (chronic obstructive lung disease) ([place]) [J44.9] 05/22/2014  . Lung nodule < 6cm on CT [R91.1] 05/22/2014  . OSA (obstructive sleep apnea) [G47.33] 07/11/2013  . Lung nodule [R91.1] 05/11/2013  . Nocturnal hypoxemia [G47.34] 05/11/2013  . Obstructive lung disease ([place]) [J44.9] 04/17/2013  . Chronic cough [R05] 04/17/2013  . Syncope [R55] 03/25/2011  . Hypertension [I10] 03/25/2011  . Hypokalemia [E87.6] 03/25/2011  . Asthma [J45.909] 03/25/2011  . Sciatica [M54.30] 03/25/2011  . SOB (shortness of breath) [R06.02] 03/25/2011   History of Present Illness: This is a 70 year old man with a history of recurrent severe depression. I first saw him last Friday in the emergency room. He came at the recommendation of his primary psychiatrist because of severe depression and anxiety that had not responded to medication. There was a suggestion that he consider ECT. Patient was at that time stating that he did not feel safe with himself going home and was admitted to psychiatry. Over the weekend his  sodium level which was already low decline further. He was transferred to the medical service. That has now improved and he is being transferred back to psychiatry. Patient continues to endorse depression and anxiety with wide ranging intense anxiety about almost everything. Lots of rumination. He denies any suicidal intent or plan. Denies any hallucinations. Associated Signs/Symptoms: Depression Symptoms:  depressed mood, anhedonia, fatigue, feelings of worthlessness/guilt, difficulty concentrating, hopelessness, impaired memory, recurrent thoughts of death, anxiety, (Hypo) Manic Symptoms:  Distractibility, Anxiety Symptoms:  Excessive Worry, Psychotic Symptoms:  None PTSD Symptoms: Negative Total Time spent with patient: 1 hour  Past Psychiatric History: Patient has a history of severe recurrent depression as well as OCD diagnosis. He's been on multiple medications in the past. In the past it sounds like there was a time when serotonin reuptake inhibitors alone more effective but gradually they lost their effectiveness. More recently he has tried changing to other antidepressants and had also recently tried changing to lamotrigine all without much satisfactory help. Patient does not have a history of suicide attempts or prior hospitalizations. No history of prior ECT  Is the patient at risk to self? Yes.    Has the patient been a risk to self in the past 6 months? No.  Has the patient been a risk to self within the distant past? No.  Is the patient a risk to others? No.  Has the patient been a risk to others in the past 6 months? No.  Has the patient been a risk to others within the distant past? No.   Prior Inpatient Therapy:   Prior Outpatient Therapy:    Alcohol Screening: 1. How often do you have a drink containing alcohol?: Never 2. How many drinks containing alcohol do you  have on a typical day when you are drinking?: 1 or 2 3. How often do you have six or more drinks on one  occasion?: Never Preliminary Score: 0 4. How often during the last year have you found that you were not able to stop drinking once you had started?: Never 5. How often during the last year have you failed to do what was normally expected from you becasue of drinking?: Never 6. How often during the last year have you needed a first drink in the morning to get yourself going after a heavy drinking session?: Never 7. How often during the last year have you had a feeling of guilt of remorse after drinking?: Never 8. How often during the last year have you been unable to remember what happened the night before because you had been drinking?: Never 9. Have you or someone else been injured as a result of your drinking?: No 10. Has a relative or friend or a doctor or another health worker been concerned about your drinking or suggested you cut down?: No Alcohol Use Disorder Identification Test Final Score (AUDIT): 0 Brief Intervention: AUDIT score less than 7 or less-screening does not suggest unhealthy drinking-brief intervention not indicated Substance Abuse History in the last 12 months:  No. Consequences of Substance Abuse: Negative Previous Psychotropic Medications: Yes  Psychological Evaluations: Yes  Past Medical History:  Past Medical History  Diagnosis Date  . Hypertension   . Hyperlipemia   . Anxiety   . Asthma   . Depression     Past Surgical History  Procedure Laterality Date  . Neck skin tag biopsy    . 2d echocardiogram  03/27/11  . Tonsillectomy and adenoidectomy    . Cataract extraction     Family History:  Family History  Problem Relation Age of Onset  . Emphysema Mother   . Allergies Mother   . Heart disease Father    Family Psychiatric  History: There is a family history of depression and anxiety Tobacco Screening: @FLOW ([phone])::1)@ Social History:  History  Alcohol Use  . 4.2 oz/week  . 7 Glasses of wine per week    Comment: 2-4 glasses of wine per  night, none in 3 weeks     History  Drug Use No    Additional Social History:                           Allergies:  No Known Allergies Lab Results:  Results for orders placed or performed during the hospital encounter of 09/20/15 (from the past 48 hour(s))  Sodium     Status: Abnormal   Collection Time: 09/20/15 11:39 PM  Result Value Ref Range   Sodium 122 (L) 135 - 145 mmol/L  Basic metabolic panel     Status: Abnormal   Collection Time: 09/21/15  2:07 AM  Result Value Ref Range   Sodium 124 (L) 135 - 145 mmol/L   Potassium 4.2 3.5 - 5.1 mmol/L   Chloride 92 (L) 101 - 111 mmol/L   CO2 23 22 - 32 mmol/L   Glucose, Bld 101 (H) 65 - 99 mg/dL   BUN 15 6 - 20 mg/dL   Creatinine, Ser 0.74 0.61 - 1.24 mg/dL   Calcium 9.0 8.9 - 10.3 mg/dL   GFR calc non Af Amer >60 >60 mL/min   GFR calc Af Amer >60 >60 mL/min    Comment: (NOTE) The eGFR has been calculated using the CKD  EPI equation. This calculation has not been validated in all clinical situations. eGFR's persistently <60 mL/min signify possible Chronic Kidney Disease.    Anion gap 9 5 - 15  CBC     Status: Abnormal   Collection Time: 09/21/15  2:07 AM  Result Value Ref Range   WBC 7.6 3.8 - 10.6 K/uL   RBC 4.04 (L) 4.40 - 5.90 MIL/uL   Hemoglobin 12.7 (L) 13.0 - 18.0 g/dL   HCT 36.0 (L) 40.0 - 52.0 %   MCV 88.9 80.0 - 100.0 fL   MCH 31.3 26.0 - 34.0 pg   MCHC 35.2 32.0 - 36.0 g/dL   RDW 12.1 11.5 - 14.5 %   Platelets 257 150 - 440 K/uL  Sodium     Status: Abnormal   Collection Time: 09/21/15  7:26 AM  Result Value Ref Range   Sodium 124 (L) 135 - 145 mmol/L  Sodium     Status: Abnormal   Collection Time: 09/21/15 12:57 PM  Result Value Ref Range   Sodium 125 (L) 135 - 145 mmol/L  Osmolality     Status: Abnormal   Collection Time: 09/21/15 12:57 PM  Result Value Ref Range   Osmolality 265 (L) 275 - 295 mOsm/kg  Osmolality, urine     Status: None   Collection Time: 09/21/15  3:00 PM  Result Value Ref  Range   Osmolality, Ur 552 300 - 900 mOsm/kg  Sodium     Status: Abnormal   Collection Time: 09/21/15  7:14 PM  Result Value Ref Range   Sodium 123 (L) 135 - 145 mmol/L  Basic metabolic panel     Status: Abnormal   Collection Time: 09/22/15  1:52 AM  Result Value Ref Range   Sodium 128 (L) 135 - 145 mmol/L   Potassium 4.0 3.5 - 5.1 mmol/L   Chloride 93 (L) 101 - 111 mmol/L   CO2 26 22 - 32 mmol/L   Glucose, Bld 84 65 - 99 mg/dL   BUN 23 (H) 6 - 20 mg/dL   Creatinine, Ser 0.86 0.61 - 1.24 mg/dL   Calcium 9.4 8.9 - 10.3 mg/dL   GFR calc non Af Amer >60 >60 mL/min   GFR calc Af Amer >60 >60 mL/min    Comment: (NOTE) The eGFR has been calculated using the CKD EPI equation. This calculation has not been validated in all clinical situations. eGFR's persistently <60 mL/min signify possible Chronic Kidney Disease.    Anion gap 9 5 - 15  Hemoglobin     Status: None   Collection Time: 09/22/15  1:52 AM  Result Value Ref Range   Hemoglobin 13.6 13.0 - 18.0 g/dL  Sodium     Status: Abnormal   Collection Time: 09/22/15  8:13 AM  Result Value Ref Range   Sodium 128 (L) 135 - 145 mmol/L  Sodium     Status: Abnormal   Collection Time: 09/22/15 12:41 PM  Result Value Ref Range   Sodium 128 (L) 135 - 145 mmol/L    Blood Alcohol level:  Lab Results  Component Value Date   ETH <5 01/60/1093    Metabolic Disorder Labs:  Lab Results  Component Value Date   HGBA1C 5.7 09/17/2015   MPG 117* 03/26/2011   Lab Results  Component Value Date   PROLACTIN 18.6* 09/17/2015   Lab Results  Component Value Date   CHOL 173 09/17/2015   TRIG 140 09/17/2015   HDL 58 09/17/2015   CHOLHDL 3.0 09/17/2015  VLDL 28 09/17/2015   LDLCALC 87 09/17/2015   LDLCALC 43 03/26/2011   LDLCALC 46 03/26/2011    Current Medications: Current Facility-Administered Medications  Medication Dose Route Frequency Provider Last Rate Last Dose  . acetaminophen (TYLENOL) tablet 650 mg  650 mg Oral Q6H PRN Gonzella Lex, MD       Or  . acetaminophen (TYLENOL) suppository 650 mg  650 mg Rectal Q6H PRN Gonzella Lex, MD      . acetaminophen (TYLENOL) tablet 650 mg  650 mg Oral Q6H PRN Gonzella Lex, MD      . alum & mag hydroxide-simeth (MAALOX/MYLANTA) 200-200-20 MG/5ML suspension 30 mL  30 mL Oral Q4H PRN Gonzella Lex, MD      . cloNIDine (CATAPRES) tablet 0.1 mg  0.1 mg Oral Q6H PRN Gonzella Lex, MD      . enoxaparin (LOVENOX) injection 40 mg  40 mg Subcutaneous Q24H Gonzella Lex, MD      . Derrill Memo ON 09/23/2015] feeding supplement (BOOST / RESOURCE BREEZE) liquid 1 Container  1 Container Oral BID BM [place] Wiseman T Carsen Machi, MD      . LORazepam (ATIVAN) tablet 0.25 mg  0.25 mg Oral QID Gonzella Lex, MD      . magnesium hydroxide (MILK OF MAGNESIA) suspension 30 mL  30 mL Oral Daily PRN Gonzella Lex, MD      . ondansetron (ZOFRAN) tablet 4 mg  4 mg Oral Q6H PRN Gonzella Lex, MD       Or  . ondansetron (ZOFRAN) injection 4 mg  4 mg Intravenous Q6H PRN Gonzella Lex, MD      . QUEtiapine (SEROQUEL) tablet 150 mg  150 mg Oral QHS Gonzella Lex, MD      . senna-docusate (Senokot-S) tablet 1 tablet  1 tablet Oral QHS PRN Gonzella Lex, MD      . Derrill Memo ON 09/23/2015] tiotropium (SPIRIVA) inhalation capsule 18 mcg  18 mcg Inhalation Daily Gonzella Lex, MD       PTA Medications: Prescriptions prior to admission  Medication Sig Dispense Refill Last Dose  . aspirin 81 MG tablet Take 81 mg by mouth daily.     . feeding supplement (BOOST / RESOURCE BREEZE) LIQD Take 1 Container by mouth 2 (two) times daily between meals. 60 Container 5   . fluticasone (FLONASE) 50 MCG/ACT nasal spray Place 2 sprays into both nostrils daily.     Marland Kitchen gabapentin (NEURONTIN) 300 MG capsule Take 300 mg by mouth at bedtime.     Marland Kitchen LORazepam (ATIVAN) 0.5 MG tablet Take 0.5 tablets (0.25 mg total) by mouth 4 (four) times daily. 30 tablet 0   . QUEtiapine (SEROQUEL) 100 MG tablet Take 150 mg by mouth at bedtime.        Musculoskeletal: Strength & Muscle Tone: within normal limits Gait & Station: normal Patient leans: N/A  Psychiatric Specialty Exam: Physical Exam  Nursing note and vitals reviewed. Constitutional: He appears well-developed and well-nourished.  HENT:  Head: Normocephalic and atraumatic.  Eyes: Conjunctivae are normal. Pupils are equal, round, and reactive to light.  Neck: Normal range of motion.  Cardiovascular: Regular rhythm and normal heart sounds.   Respiratory: Effort normal. No respiratory distress.  GI: Soft.  Musculoskeletal: Normal range of motion.  Neurological: He is alert.  Skin: Skin is warm and dry.  Psychiatric: Judgment and thought content normal. His mood appears anxious. His speech is delayed. He is slowed. Cognition  and memory are normal. He exhibits a depressed mood.    Review of Systems  Constitutional: Negative.   HENT: Negative.   Eyes: Negative.   Respiratory: Negative.   Cardiovascular: Negative.   Gastrointestinal: Negative.   Musculoskeletal: Negative.   Skin: Negative.   Neurological: Negative.   Psychiatric/Behavioral: Positive for depression. Negative for suicidal ideas, hallucinations, memory loss and substance abuse. The patient is nervous/anxious and has insomnia.     Blood pressure 157/73, pulse 75, temperature 98.3 F (36.8 C), temperature source Oral, resp. rate 18, height 5' 10"  (1.778 m), weight 73.483 kg (162 lb), SpO2 100 %.Body mass index is 23.24 kg/(m^2).  General Appearance: Casual  Eye Contact:  Fair  Speech:  Slow  Volume:  Decreased  Mood:  Anxious  Affect:  Constricted  Thought Process:  Goal Directed  Orientation:  Full (Time, Place, and Person)  Thought Content:  Rumination  Suicidal Thoughts:  Yes.  without intent/plan  Homicidal Thoughts:  No  Memory:  Immediate;   Good Recent;   Fair Remote;   Fair  Judgement:  Fair  Insight:  Fair  Psychomotor Activity:  Decreased  Concentration:  Concentration: Poor   Recall:  [employer] of Knowledge:  Fair  Language:  Fair  Akathisia:  No  Handed:  Right  AIMS (if indicated):     Assets:  Communication Skills Desire for Improvement Financial Resources/Insurance Housing Resilience Social Support  ADL's:  Intact  Cognition:  WNL  Sleep:          Treatment Plan Summary: Daily contact with patient to assess and evaluate symptoms and progress in treatment, Medication management and Plan Patient's sodium head down as low as 123 or 124 but has now come up. Nephrology was involved in the consensus on the internal medicine service is that Luvox was a likely cause of SIADH. The medicine has been discontinued. This is somewhat problematic especially if we generalize to the idea that all serotonin reuptake inhibitors could lead to this. At this point we will discontinue the Luvox and the Haldol. He has nighttime Seroquel. He has been taken off of antiseizure medicines. Patient can be scheduled for ECT on Friday. We will check his sodium again tomorrow. He is feeling very anxious about coming to the unit. If everything is okay as far as safety after treatment on Friday we will probably look at discharge.  Observation Level/Precautions:  15 minute checks  Laboratory:  CBC Chemistry Profile  Psychotherapy:  Daily individual and group psychotherapy support and education   Medications:  Currently Seroquel at night limiting other medications to avoid anything that may make his sodium worse   Consultations:  We will recheck labs and may asked to reconsult medicine depending on the outcome   Discharge Concerns:  Ongoing outpatient ECT if that is part of the treatment plan   Estimated LOS:2-3 days   Other:     I certify that inpatient services furnished can reasonably be expected to improve the patient's condition.    Alethia Berthold, MD 5/31/20176:17 PM

## 2015-09-23 ENCOUNTER — Other Ambulatory Visit: Payer: Self-pay | Admitting: Psychiatry

## 2015-09-23 LAB — URINALYSIS COMPLETE WITH MICROSCOPIC (ARMC ONLY)
BILIRUBIN URINE: NEGATIVE
Bacteria, UA: NONE SEEN
Glucose, UA: NEGATIVE mg/dL
Hgb urine dipstick: NEGATIVE
KETONES UR: NEGATIVE mg/dL
Leukocytes, UA: NEGATIVE
NITRITE: NEGATIVE
Protein, ur: NEGATIVE mg/dL
SPECIFIC GRAVITY, URINE: 1.019 (ref 1.005–1.030)
pH: 5 (ref 5.0–8.0)

## 2015-09-23 LAB — BASIC METABOLIC PANEL
Anion gap: 6 (ref 5–15)
BUN: 16 mg/dL (ref 6–20)
CHLORIDE: 98 mmol/L — AB (ref 101–111)
CO2: 27 mmol/L (ref 22–32)
CREATININE: 0.78 mg/dL (ref 0.61–1.24)
Calcium: 9.4 mg/dL (ref 8.9–10.3)
GFR calc Af Amer: >60 mL/min (ref >60)
GFR calc non Af Amer: >60 mL/min (ref >60)
Glucose, Bld: 102 mg/dL — ABNORMAL HIGH (ref 65–99)
POTASSIUM: 4.3 mmol/L (ref 3.5–5.1)
Sodium: 131 mmol/L — ABNORMAL LOW (ref 135–145)

## 2015-09-23 NOTE — Tx Team (Signed)
Interdisciplinary Treatment Plan Update (Adult)  Date:  09/23/2015 Time Reviewed:  12:13 PM  Progress in Treatment: Attending groups: Yes. Participating in groups:  Yes. Taking medication as prescribed:  Yes. Tolerating medication:  Yes. Family/Significant othe contact made:  Yes, individual(s) contacted:  wife Patient understands diagnosis:  Yes. Discussing patient identified problems/goals with staff:  Yes. Medical problems stabilized or resolved:  Yes. Denies suicidal/homicidal ideation: Yes. Issues/concerns per patient self-inventory:  Yes. Other:  New problem(s) identified: Yes, Describe:  NA  Discharge Plan or Barriers:Pt plans to return home and follow up with outpatient.    Reason for Continuation of Hospitalization: Depression Medication stabilization Suicidal ideation  Anxiety   Comments:This is a 70 year old man with a history of recurrent severe depression. I first saw him last Friday in the emergency room. He came at the recommendation of his primary psychiatrist because of severe depression and anxiety that had not responded to medication. There was a suggestion that he consider ECT. Patient was at that time stating that he did not feel safe with himself going home and was admitted to psychiatry. Over the weekend his sodium level which was already low decline further. He was transferred to the medical service. That has now improved and he is being transferred back to psychiatry. Patient continues to endorse depression and anxiety with wide ranging intense anxiety about almost everything. Lots of rumination. He denies any suicidal intent or plan. Denies any hallucinations.  Estimated length of stay: 1 day   New goal(s): NA  Review of initial/current patient goals per problem list:   1.  Goal(s): Patient will participate in aftercare plan * Met:  * Target date: at discharge * As evidenced by: Patient will participate within aftercare plan AEB aftercare provider and  housing plan at discharge being identified.   2.  Goal (s): Patient will exhibit decreased depressive symptoms and suicidal ideations. * Met:  *  Target date: at discharge * As evidenced by: Patient will utilize self rating of depression at 3 or below and demonstrate decreased signs of depression or be deemed stable for discharge by MD.   3.  Goal(s): Patient will demonstrate decreased signs and symptoms of anxiety. * Met:  * Target date: at discharge * As evidenced by: Patient will utilize self rating of anxiety at 3 or below and demonstrated decreased signs of anxiety, or be deemed stable for discharge by MD  Attendees: Patient:  Matthew Patrick 6/1/201712:13 PM  Family:   6/1/201712:13 PM  Physician:  Dr. Weber Cooks  6/1/201712:13 PM  Nursing:   Junita Push, RN  6/1/201712:13 PM  Case Manager:   6/1/201712:13 PM  Counselor:   6/1/201712:13 PM  Other:  Wray Kearns, LCSWA 6/1/201712:13 PM  Other:  Everitt Amber, [place] 6/1/201712:13 PM  Other:   6/1/201712:13 PM  Other:  6/1/201712:13 PM  Other:  6/1/201712:13 PM  Other:  6/1/201712:13 PM  Other:  6/1/201712:13 PM  Other:  6/1/201712:13 PM  Other:  6/1/201712:13 PM  Other:   6/1/201712:13 PM   Scribe for Treatment Team:   Wray Kearns, MSW, LCSWA  09/23/2015, 12:13 PM

## 2015-09-23 NOTE — BHH Counselor (Signed)
Adult Comprehensive Assessment  Patient ID: Matthew Patrick, male DOB: Jun 04, 1945, 70 y.o. MRN: [id number]  Information Source: Information source: Patient  Current Stressors:  Educational / Learning stressors: None reported  Employment / Job issues: Retired  Family Relationships: Pt reports strained family relationships due to his depression  Museum/gallery curator / Lack of resources (include bankruptcy): Pt reports increased debt.  Housing / Lack of housing: None reported  Physical health (include injuries & life threatening diseases): None reported  Social relationships: None reported  Substance abuse: Denies use  Bereavement / Loss: Pt's mother died recently.   Living/Environment/Situation:  Living Arrangements: Spouse/significant other Living conditions (as described by patient or guardian): Pt lives with wife.  How long has patient lived in current situation?: 4 years What is atmosphere in current home: Supportive, Loving  Family History:  Marital status: Married Number of Years Married: 4 What types of issues is patient dealing with in the relationship?: "My negativity"  Are you sexually active?: Yes What is your sexual orientation?: Heterosexual  Has your sexual activity been affected by drugs, alcohol, medication, or emotional stress?: NA Does patient have children?: Yes How many children?: 2 How is patient's relationship with their children?: 2 daughters; strained due to his depression.   Childhood History:  By whom was/is the patient raised?: Both parents Description of patient's relationship with caregiver when they were a child: Good relatioship with mother, father was "absent" Patient's description of current relationship with people who raised him/her: Parents have passed away.  How were you disciplined when you got in trouble as a child/adolescent?: NA Did patient suffer any verbal/emotional/physical/sexual abuse as a child?: No Did patient suffer from severe  childhood neglect?: No Has patient ever been sexually abused/assaulted/raped as an adolescent or adult?: No Was the patient ever a victim of a crime or a disaster?: No Witnessed domestic violence?: No Has patient been effected by domestic violence as an adult?: No  Education:  Highest grade of school patient has completed: BA Degree Currently a student?: No Learning disability?: No  Employment/Work Situation:  Employment situation: (Retired ) Patient's job has been impacted by current illness: No What is the longest time patient has a held a job?: 23 years  Where was the patient employed at that time?: [place] Has patient ever been in the [employer]?: No  Financial Resources:  Museum/gallery curator resources: [employer] (Retirement ) Does patient have a Programmer, applications or guardian?: No  Alcohol/Substance Abuse:  What has been your use of drugs/alcohol within the last 12 months?: None reported  Alcohol/Substance Abuse Treatment Hx: Denies past history Has alcohol/substance abuse ever caused legal problems?: No  Social Support System:  Heritage manager System: Manufacturing engineer System: family, outpatient provider  Type of faith/religion: Christainity  How does patient's faith help to cope with current illness?: Prayer   Leisure/Recreation:  Leisure and Hobbies: "I dont know anymore"   Strengths/Needs:  What things does the patient do well?: "I dont know"  In what areas does patient struggle / problems for patient: depression, anxiety, racing thoughts, excessive worrying.   Discharge Plan:  Does patient have access to transportation?: Yes Will patient be returning to same living situation after discharge?: Yes Currently receiving community mental health services: Yes (From Whom) (Dr Robina Ade, Marya Amsler ) Does patient have financial barriers related to discharge medications?: No  Summary/Recommendations:  Patient is a 70 year old  male admitted with a diagnosis of Major Depression. Patient presented to the hospital with depression. Patient  reports primary triggers for admission were loss of mother and financial stressors. Patient will benefit from crisis stabilization, medication evaluation, group therapy and psycho education in addition to case management for discharge. At discharge, it is recommended that patient remain compliant with established discharge plan and continued treatment.   [place] MSW, [place]  09/23/2015

## 2015-09-23 NOTE — BHH Group Notes (Signed)
[place] Group Notes:  (Nursing/MHT/Case Management/Adjunct)  Date:  09/23/2015  Time:  1:39 AM  Type of Therapy:  Group Therapy  Participation Level:  Active  Participation Quality:  Appropriate  Affect:  Appropriate  Cognitive:  Appropriate  Insight:  Appropriate  Engagement in Group:  Engaged  Modes of Intervention:  n/a  Summary of Progress/Problems:  Matthew Patrick 09/23/2015, 1:39 AM

## 2015-09-23 NOTE — Progress Notes (Signed)
Patient visible in the hallways and dayroom interacting briefly with other patients. Attended groups this morning. Stated that his goal for the day was to have a positive attitude and to remain focused. He also said that he was looking forward to his ECT treatment tomorrow. He however is worrying, anxious and depressed in mood/affect. Reassurance offered to patient as needed.

## 2015-09-23 NOTE — Progress Notes (Signed)
D: Pt denies SI/HI/AVH. Pt is pleasant and cooperative. Pt stated he was very depressed . Pt worried about ECT, but pt hopeful it will help his situation.   A: Pt was offered support and encouragement. Pt was given scheduled medications. Pt was encourage to attend groups. Q 15 minute checks were done for safety.   R:Pt attends groups and interacts well with peers and staff. Pt is taking medication. Pt has no complaints.Pt receptive to treatment and safety maintained on unit.

## 2015-09-23 NOTE — Progress Notes (Signed)
[facility] MD Progress Note  09/23/2015 6:12 PM Matthew Patrick  MRN:  [id number] Subjective:  This is a 70 year old man with a history of recurrent severe depression and obsessive-compulsive disorder. Initially admitted for ECT treatment he was transferred over the weekend to medicine because of hyponatremia. Sodium has improved and he is transferred back to psychiatry. Patient was seen today with treatment team. He reports that he is still depressed and very anxious but does not have any thought or intent of acting on it to kill himself. He seems confused at times and very anxious. Despite this he is able to take care of his ADLs. No obvious responding to internal stimuli. Patient is agreeable to beginning ECT treatment tomorrow. Principal Problem: Severe recurrent major depression without psychotic features ([place]) Diagnosis:   Patient Active Problem List   Diagnosis Date Noted  . Generalized weakness [R53.1] 09/22/2015  . Depression [F32.9] 09/22/2015  . Anemia [D64.9] 09/22/2015  . Obsessive compulsive disorder [F42.9] 09/22/2015  . Generalized anxiety disorder [F41.1] 09/22/2015  . SIADH (syndrome of inappropriate ADH production) ([place]) [E22.2] 09/21/2015  . Hyponatremia [E87.1] 09/20/2015  . Severe recurrent major depression with psychotic features ([place]) [F33.3] 09/19/2015  . Severe recurrent major depression without psychotic features ([place]) [F33.2] 09/17/2015  . Suicidal ideation [R45.851] 09/17/2015  . Cough [R05] 01/19/2015  . Restless leg [G25.81] 01/19/2015  . COLD (chronic obstructive lung disease) ([place]) [J44.9] 05/22/2014  . Lung nodule < 6cm on CT [R91.1] 05/22/2014  . OSA (obstructive sleep apnea) [G47.33] 07/11/2013  . Lung nodule [R91.1] 05/11/2013  . Nocturnal hypoxemia [G47.34] 05/11/2013  . Obstructive lung disease ([place]) [J44.9] 04/17/2013  . Chronic cough [R05] 04/17/2013  . Syncope [R55] 03/25/2011  . Hypertension [I10] 03/25/2011  . Hypokalemia [E87.6] 03/25/2011  . Asthma  [J45.909] 03/25/2011  . Sciatica [M54.30] 03/25/2011  . SOB (shortness of breath) [R06.02] 03/25/2011   Total Time spent with patient: 30 minutes  Past Psychiatric History: Long history of depression multiple medications of been tried. Also diagnosed with OCD. Entering the hospital now for ECT treatment. Patient has no past history of suicidal behavior  Past Medical History:  Past Medical History  Diagnosis Date  . Hypertension   . Hyperlipemia   . Anxiety   . Asthma   . Depression     Past Surgical History  Procedure Laterality Date  . Neck skin tag biopsy    . 2d echocardiogram  03/27/11  . Tonsillectomy and adenoidectomy    . Cataract extraction     Family History:  Family History  Problem Relation Age of Onset  . Emphysema Mother   . Allergies Mother   . Heart disease Father    Family Psychiatric  History: Positive for depression and anxiety Social History:  History  Alcohol Use  . 4.2 oz/week  . 7 Glasses of wine per week    Comment: 2-4 glasses of wine per night, none in 3 weeks     History  Drug Use No    Social History   Social History  . Marital Status: Significant Other    Spouse Name: N/A  . Number of Children: N/A  . Years of Education: N/A   Occupational History  . retired    Social History Main Topics  . Smoking status: Former Smoker -- 1.00 packs/day for 5 years    Types: Cigarettes    Quit date: 04/25/1967  . Smokeless tobacco: None  . Alcohol Use: 4.2 oz/week    7 Glasses of wine per week  Comment: 2-4 glasses of wine per night, none in 3 weeks  . Drug Use: No  . Sexual Activity: No   Other Topics Concern  . None   Social History Narrative   Additional Social History:                         Sleep: Fair  Appetite:  Fair  Current Medications: Current Facility-Administered Medications  Medication Dose Route Frequency Provider Last Rate Last Dose  . acetaminophen (TYLENOL) tablet 650 mg  650 mg Oral Q6H PRN Gonzella Lex, MD       Or  . acetaminophen (TYLENOL) suppository 650 mg  650 mg Rectal Q6H PRN Gonzella Lex, MD      . alum & mag hydroxide-simeth (MAALOX/MYLANTA) 200-200-20 MG/5ML suspension 30 mL  30 mL Oral Q4H PRN Gonzella Lex, MD      . cloNIDine (CATAPRES) tablet 0.1 mg  0.1 mg Oral Q6H PRN Gonzella Lex, MD      . enoxaparin (LOVENOX) injection 40 mg  40 mg Subcutaneous Q24H Gonzella Lex, MD   40 mg at 09/22/15 2134  . feeding supplement (BOOST / RESOURCE BREEZE) liquid 1 Container  1 Container Oral BID BM Gonzella Lex, MD   1 Container at 09/23/15 1427  . LORazepam (ATIVAN) tablet 0.25 mg  0.25 mg Oral QID Gonzella Lex, MD   0.25 mg at 09/23/15 1705  . magnesium hydroxide (MILK OF MAGNESIA) suspension 30 mL  30 mL Oral Daily PRN Gonzella Lex, MD      . ondansetron (ZOFRAN) tablet 4 mg  4 mg Oral Q6H PRN Gonzella Lex, MD       Or  . ondansetron (ZOFRAN) injection 4 mg  4 mg Intravenous Q6H PRN Gonzella Lex, MD      . QUEtiapine (SEROQUEL) tablet 150 mg  150 mg Oral QHS Gonzella Lex, MD   150 mg at 09/22/15 2133  . senna-docusate (Senokot-S) tablet 1 tablet  1 tablet Oral QHS PRN Gonzella Lex, MD      . tiotropium [facility]) inhalation capsule 18 mcg  18 mcg Inhalation Daily Gonzella Lex, MD   18 mcg at 09/23/15 1421    Lab Results:  Results for orders placed or performed during the hospital encounter of 09/22/15 (from the past 48 hour(s))  Basic metabolic panel     Status: Abnormal   Collection Time: 09/23/15  7:47 AM  Result Value Ref Range   Sodium 131 (L) 135 - 145 mmol/L   Potassium 4.3 3.5 - 5.1 mmol/L   Chloride 98 (L) 101 - 111 mmol/L   CO2 27 22 - 32 mmol/L   Glucose, Bld 102 (H) 65 - 99 mg/dL   BUN 16 6 - 20 mg/dL   Creatinine, Ser 0.78 0.61 - 1.24 mg/dL   Calcium 9.4 8.9 - 10.3 mg/dL   GFR calc non Af Amer >60 >60 mL/min   GFR calc Af Amer >60 >60 mL/min    Comment: (NOTE) The eGFR has been calculated using the CKD EPI equation. This calculation  has not been validated in all clinical situations. eGFR's persistently <60 mL/min signify possible Chronic Kidney Disease.    Anion gap 6 5 - 15    Blood Alcohol level:  Lab Results  Component Value Date   ETH <5 09/17/2015    Physical Findings: AIMS:  , ,  ,  ,  CIWA:    COWS:     Musculoskeletal: Strength & Muscle Tone: within normal limits Gait & Station: normal Patient leans: N/A  Psychiatric Specialty Exam: Physical Exam  Nursing note and vitals reviewed. Constitutional: He appears well-developed and well-nourished.  HENT:  Head: Normocephalic and atraumatic.  Eyes: Conjunctivae are normal. Pupils are equal, round, and reactive to light.  Neck: Normal range of motion.  Cardiovascular: Normal heart sounds.   Respiratory: Effort normal.  GI: Soft.  Musculoskeletal: Normal range of motion.  Neurological: He is alert.  Skin: Skin is warm and dry.  Psychiatric: Thought content normal. His mood appears anxious. His speech is delayed. He is slowed. He expresses impulsivity. He exhibits a depressed mood. He exhibits abnormal recent memory.    Review of Systems  Constitutional: Negative.   HENT: Negative.   Eyes: Negative.   Respiratory: Negative.   Cardiovascular: Negative.   Gastrointestinal: Negative.   Musculoskeletal: Negative.   Skin: Negative.   Neurological: Negative.   Psychiatric/Behavioral: Positive for depression and memory loss. Negative for suicidal ideas, hallucinations and substance abuse. The patient is nervous/anxious and has insomnia.     Blood pressure 147/69, pulse 93, temperature 98.5 F (36.9 C), temperature source Oral, resp. rate 18, height 5' 10"  (1.778 m), weight 73.483 kg (162 lb), SpO2 100 %.Body mass index is 23.24 kg/(m^2).  General Appearance: Casual  Eye Contact:  Fair  Speech:  Slow  Volume:  Decreased  Mood:  Anxious and Depressed  Affect:  Congruent  Thought Process:  Goal Directed  Orientation:  Full (Time, Place, and  Person)  Thought Content:  Obsessions and Rumination  Suicidal Thoughts:  No  Homicidal Thoughts:  No  Memory:  Immediate;   Fair Recent;   Fair Remote;   Fair  Judgement:  Fair  Insight:  Fair  Psychomotor Activity:  Decreased  Concentration:  Concentration: Poor  Recall:  [employer] of Knowledge:  Fair  Language:  Fair  Akathisia:  No  Handed:  Right  AIMS (if indicated):     Assets:  Communication Skills Desire for Improvement Financial Resources/Insurance Housing Physical Health Resilience Social Support  ADL's:  Intact  Cognition:  Impaired,  Mild  Sleep:  Number of Hours: 6.15     Treatment Plan Summary: Daily contact with patient to assess and evaluate symptoms and progress in treatment, Medication management and Plan As far as depression we have discontinued the Luvox. This was done because of his hyponatremia. Patient would certainly benefit from being on serotonergic medicine in the future. I am proposing that we restart antidepressant medicine but I'm hesitant to do that while we are still waiting for the sodium to normalize. Far as the rest of the treatment for depression he will begin ECT tomorrow. Tomorrow we will have right unilateral ECT treatment in the morning. Patient understands the plan and is agreeable. His sodium today is 130 which is much improved and should be safer treatment tomorrow but we will also recheck it in the morning. Patient is not currently voicing suicidal ideation. He is very anxious. Lots of reassurance done in treatment team. Likely discharge by tomorrow  Alethia Berthold, MD 09/23/2015, 6:12 PM

## 2015-09-23 NOTE — Telephone Encounter (Signed)
LMTCB

## 2015-09-23 NOTE — Progress Notes (Signed)
Recreation Therapy Notes  Date: 06.01.17 Time: 1:00 pm Location: Craft Room  Group Topic: Leisure Education  Goal Area(s) Addresses:  Patient will identify activities for each letter of the alphabet. Patient will verbalize ability to integrate positive leisure into life post d/c. Patient will verbalize ability to use leisure as a Technical sales engineer.  Behavioral Response: Attentive, Interactive  Intervention: Leisure Alphabet  Activity: Patients were given a Leisure Alphabet and instructed to write a healthy leisure activity for each letter of the alphabet.  Education: LRT educated patients on what they need to participate in leisure.  Education Outcome: Acknowledges education/In group clarification offered  Clinical Observations/Feedback: Patient completed activity by writing healthy leisure activities. Patient contributed to group discussion by stating healthy leisure activities, and what he needs to participate in leisure.  Leonette Monarch, LRT/CTRS 09/23/2015 2:47 PM

## 2015-09-23 NOTE — BHH Suicide Risk Assessment (Signed)
[place] INPATIENT: Family/Significant Other Suicide Prevention Education  Suicide Prevention Education:  Education Completed; Matthew Patrick (wife) has been identified by the patient as the family member/significant other with whom the patient will be residing, and identified as the person(s) who will aid the patient in the event of a mental health crisis (suicidal ideations/suicide attempt). With written consent from the patient, the family member/significant other has been provided the following suicide prevention education, prior to the and/or following the discharge of the patient.  The suicide prevention education provided includes the following:  Suicide risk factors  Suicide prevention and interventions  National Suicide Hotline telephone number  [facility] assessment telephone number  [facility] Emergency Assistance [place] and/or Residential Mobile Crisis Unit telephone number  Request made of family/significant other to:  Remove weapons (e.g., guns, rifles, knives), all items previously/currently identified as safety concern.   Remove drugs/medications (over-the-counter, prescriptions, illicit drugs), all items previously/currently identified as a safety concern.  The family member/significant other verbalizes understanding of the suicide prevention education information provided. The family member/significant other agrees to remove the items of safety concern listed above.    Wray Kearns, MSW, LCSWA 09/23/2015 4:25 PM

## 2015-09-23 NOTE — Progress Notes (Signed)
[facility]  ROUNDING NOTE   Subjective:   Transferred to Behavioral Med.  Na 131.  Continues to complain of abdominal bloating and constipation.   Objective:  Vital signs in last 24 hours:  Temp:  [98.3 F (36.8 C)-98.5 F (36.9 C)] 98.5 F (36.9 C) (06/01 0701) Pulse Rate:  [75-93] 93 (06/01 0701) Resp:  [18] 18 (06/01 0701) BP: (147-157)/(69-73) 147/69 mmHg (06/01 0701) SpO2:  [100 %] 100 % (05/31 1700) Weight:  [73.483 kg (162 lb)] 73.483 kg (162 lb) (05/31 1700)  Weight change:  Filed Weights   09/22/15 1700  Weight: 73.483 kg (162 lb)    Intake/Output: I/O last 3 completed shifts: In: 480 [P.O.:480] Out: -    Intake/Output this shift:  Total I/O In: 720 [P.O.:720] Out: -   Physical Exam: General: NAD  Head: Normocephalic, atraumatic. Moist oral mucosal membranes  Eyes: Anicteric, PERRL  Neck: Supple, trachea midline  Lungs:  Clear to auscultation  Heart: Regular rate and rhythm  Abdomen:  Soft, nontender,   Extremities: no peripheral edema.  Neurologic: Nonfocal, moving all four extremities  Skin: No lesions       Basic Metabolic Panel:  Recent Labs Lab 09/18/15 0726 09/19/15 1620 09/20/15 1131 09/20/15 1747  09/21/15 0207  09/21/15 1914 09/22/15 0152 09/22/15 0813 09/22/15 1241 09/23/15 0747  NA 127* 124* 123* 123*  < > 124*  < > 123* 128* 128* 128* 131*  K 4.0 4.3  --   --   --  4.2  --   --  4.0  --   --  4.3  CL 96* 89*  --   --   --  92*  --   --  93*  --   --  98*  CO2 23 27  --   --   --  23  --   --  26  --   --  27  GLUCOSE 100* 111*  --   --   --  101*  --   --  84  --   --  102*  BUN 9 14  --   --   --  15  --   --  23*  --   --  16  CREATININE 0.77 0.86 0.83 0.76  --  0.74  --   --  0.86  --   --  0.78  CALCIUM 9.4 9.5  --   --   --  9.0  --   --  9.4  --   --  9.4  < > = values in this interval not displayed.  Liver Function Tests:  Recent Labs Lab 09/17/15 1445  AST 26  ALT 25  ALKPHOS 71  BILITOT 0.5   PROT 6.9  ALBUMIN 4.3   No results for input(s): LIPASE, AMYLASE in the last 168 hours. No results for input(s): AMMONIA in the last 168 hours.  CBC:  Recent Labs Lab 09/17/15 1445 09/20/15 1131 09/20/15 1747 09/21/15 0207 09/22/15 0152  WBC 10.4 8.7 9.4 7.6  --   HGB 13.7 13.7 13.9 12.7* 13.6  HCT 38.5* 38.5* 39.0* 36.0*  --   MCV 90.0 90.3 89.3 88.9  --   PLT 264 272 286 257  --     Cardiac Enzymes: No results for input(s): CKTOTAL, CKMB, CKMBINDEX, TROPONINI in the last 168 hours.  BNP: Invalid input(s): POCBNP  CBG: No results for input(s): GLUCAP in the last 168 hours.  Microbiology: No results found for  this or any previous visit.  Coagulation Studies: No results for input(s): LABPROT, INR in the last 72 hours.  Urinalysis: No results for input(s): COLORURINE, LABSPEC, PHURINE, GLUCOSEU, HGBUR, BILIRUBINUR, KETONESUR, PROTEINUR, UROBILINOGEN, NITRITE, LEUKOCYTESUR in the last 72 hours.  Invalid input(s): APPERANCEUR    Imaging: No results found.   Medications:     . enoxaparin (LOVENOX) injection  40 mg Subcutaneous Q24H  . feeding supplement  1 Container Oral BID BM  . LORazepam  0.25 mg Oral QID  . QUEtiapine  150 mg Oral QHS  . tiotropium  18 mcg Inhalation Daily   acetaminophen **OR** acetaminophen, alum & mag hydroxide-simeth, cloNIDine, magnesium hydroxide, ondansetron **OR** ondansetron (ZOFRAN) IV, senna-docusate  Assessment/ Plan:  Mr. Matthew Patrick is a 70 y.o. white male with major depressive disorder, hypertension, hyperlipidemia, generalized anxiety disorder, asthma/COPD, cataracts who was admitted to [facility] on 09/20/2015 for hyponatremia  1. Hyponatremia:  SIADH from fluvoxamine. Now improving, 131. Continue to monitor.  As long as sodium above 130, medically cleared for ECT medical procedure.    LOS: [place], Eleonor Ocon 6/1/201712:04 PM

## 2015-09-23 NOTE — Plan of Care (Signed)
Problem: Self-Concept: Goal: Ability to verbalize positive feelings about self will improve Outcome: Progressing Patient states that he will maintain a positive attitude and remain focused today.

## 2015-09-23 NOTE — Plan of Care (Signed)
Problem: Activity: Goal: Interest or engagement in leisure activities will improve Outcome: Progressing Patient attended groups this morning.

## 2015-09-23 NOTE — Plan of Care (Signed)
Problem: Coping: Goal: Ability to cope will improve Outcome: Not Met (add Reason) Patient is anxious and cooperative. Med compliant. S/e and adverse reaction discussed. Questions encouraged. Interacting with peers and staff. q 15 min checks maintained. Pt remains free from harm.

## 2015-09-23 NOTE — Progress Notes (Signed)
Pt remains anxious and restless. Med compliant. S/e and adverse reactions discussed. Questions encouraged. Clinical support provided. CPAP in place. No s/s of resp distress noted. No voiced thought of hurting himself. Will continue to monitor for safety and behavior. Slept 6.15

## 2015-09-24 ENCOUNTER — Inpatient Hospital Stay: Payer: Medicare Other | Admitting: Anesthesiology

## 2015-09-24 ENCOUNTER — Ambulatory Visit: Payer: Self-pay | Admitting: Licensed Clinical Social Worker

## 2015-09-24 DIAGNOSIS — F332 Major depressive disorder, recurrent severe without psychotic features: Secondary | ICD-10-CM | POA: Insufficient documentation

## 2015-09-24 DIAGNOSIS — F419 Anxiety disorder, unspecified: Secondary | ICD-10-CM | POA: Insufficient documentation

## 2015-09-24 DIAGNOSIS — I10 Essential (primary) hypertension: Secondary | ICD-10-CM | POA: Insufficient documentation

## 2015-09-24 DIAGNOSIS — E785 Hyperlipidemia, unspecified: Secondary | ICD-10-CM | POA: Insufficient documentation

## 2015-09-24 DIAGNOSIS — J45909 Unspecified asthma, uncomplicated: Secondary | ICD-10-CM | POA: Insufficient documentation

## 2015-09-24 DIAGNOSIS — Z87891 Personal history of nicotine dependence: Secondary | ICD-10-CM | POA: Insufficient documentation

## 2015-09-24 LAB — BASIC METABOLIC PANEL
Anion gap: 11 (ref 5–15)
BUN: 15 mg/dL (ref 6–20)
CHLORIDE: 96 mmol/L — AB (ref 101–111)
CO2: 24 mmol/L (ref 22–32)
CREATININE: 0.81 mg/dL (ref 0.61–1.24)
Calcium: 9.2 mg/dL (ref 8.9–10.3)
Glucose, Bld: 96 mg/dL (ref 65–99)
Potassium: 4 mmol/L (ref 3.5–5.1)
SODIUM: 131 mmol/L — AB (ref 135–145)

## 2015-09-24 MED ORDER — QUETIAPINE FUMARATE 50 MG PO TABS: 150.0000 mg | ORAL_TABLET | Freq: Every day | ORAL | Status: DC

## 2015-09-24 MED ORDER — SODIUM CHLORIDE 0.9 % IV SOLN
250.0000 mL | Freq: Once | INTRAVENOUS | Status: AC
  Administered 2015-09-24: 10:00:00 via INTRAVENOUS

## 2015-09-24 MED ORDER — SUCCINYLCHOLINE CHLORIDE 20 MG/ML IJ SOLN
INTRAMUSCULAR | Status: DC | PRN
Start: 2015-09-24 — End: 2015-09-24
  Administered 2015-09-24: 80 mg via INTRAVENOUS

## 2015-09-24 MED ORDER — METHOHEXITAL SODIUM 100 MG/10ML IV SOSY
PREFILLED_SYRINGE | INTRAVENOUS | Status: DC | PRN
  Administered 2015-09-24: 70 mg via INTRAVENOUS

## 2015-09-24 MED ORDER — SODIUM CHLORIDE 0.9 % IV SOLN
INTRAVENOUS | Status: DC | PRN
  Administered 2015-09-24: 11:00:00 via INTRAVENOUS

## 2015-09-24 NOTE — BHH Suicide Risk Assessment (Signed)
[facility] Discharge Suicide Risk Assessment   Principal Problem: Severe recurrent major depression without psychotic features [facility]) Discharge Diagnoses:  Patient Active Problem List   Diagnosis Date Noted  . Generalized weakness [R53.1] 09/22/2015  . Depression [F32.9] 09/22/2015  . Anemia [D64.9] 09/22/2015  . Obsessive compulsive disorder [F42.9] 09/22/2015  . Generalized anxiety disorder [F41.1] 09/22/2015  . SIADH (syndrome of inappropriate ADH production) ([place]) [E22.2] 09/21/2015  . Hyponatremia [E87.1] 09/20/2015  . Severe recurrent major depression with psychotic features ([place]) [F33.3] 09/19/2015  . Severe recurrent major depression without psychotic features ([place]) [F33.2] 09/17/2015  . Suicidal ideation [R45.851] 09/17/2015  . Cough [R05] 01/19/2015  . Restless leg [G25.81] 01/19/2015  . COLD (chronic obstructive lung disease) ([place]) [J44.9] 05/22/2014  . Lung nodule < 6cm on CT [R91.1] 05/22/2014  . OSA (obstructive sleep apnea) [G47.33] 07/11/2013  . Lung nodule [R91.1] 05/11/2013  . Nocturnal hypoxemia [G47.34] 05/11/2013  . Obstructive lung disease ([place]) [J44.9] 04/17/2013  . Chronic cough [R05] 04/17/2013  . Syncope [R55] 03/25/2011  . Hypertension [I10] 03/25/2011  . Hypokalemia [E87.6] 03/25/2011  . Asthma [J45.909] 03/25/2011  . Sciatica [M54.30] 03/25/2011  . SOB (shortness of breath) [R06.02] 03/25/2011    Total Time spent with patient: 35 minutes  Musculoskeletal: Strength & Muscle Tone: within normal limits Gait & Station: normal Patient leans: N/A  Psychiatric Specialty Exam: Review of Systems  Constitutional: Negative.   HENT: Negative.   Eyes: Negative.   Respiratory: Negative.   Cardiovascular: Negative.   Gastrointestinal: Negative.   Musculoskeletal: Negative.   Skin: Negative.   Neurological: Negative.   Psychiatric/Behavioral: Positive for depression and memory loss. Negative for suicidal ideas, hallucinations and substance abuse. The patient  is nervous/anxious and has insomnia.     Blood pressure 162/83, pulse 84, temperature 98.6 F (37 C), temperature source Temporal, resp. rate 14, height 5\' 10"  (1.778 m), weight 73.029 kg (161 lb), SpO2 96 %.Body mass index is 23.1 kg/(m^2).  General Appearance: Casual  Eye Contact::  Fair  Speech:  Slow409  Volume:  Decreased  Mood:  Anxious and Depressed  Affect:  Constricted  Thought Process:  Goal Directed  Orientation:  Full (Time, Place, and Person)  Thought Content:  Obsessions and Rumination  Suicidal Thoughts:  No  Homicidal Thoughts:  No  Memory:  Immediate;   Good Recent;   Fair Remote;   Fair  Judgement:  Fair  Insight:  Fair  Psychomotor Activity:  Decreased  Concentration:  Fair  Recall:  [employer] of Knowledge:Fair  Language: Fair  Akathisia:  No  Handed:  Right  AIMS (if indicated):     Assets:  Desire for Improvement Financial Resources/Insurance Housing Resilience Social Support  Sleep:  Number of Hours: 6.15  Cognition: Impaired,  Mild  ADL's:  Intact   Mental Status Per Nursing Assessment::   On Admission:     Demographic Factors:  Age 15 or older and Caucasian  Loss Factors: Decline in physical health  Historical Factors: NA  Risk Reduction Factors:   Sense of responsibility to family, Religious beliefs about death, Living with another person, especially a relative, Positive social support and Positive therapeutic relationship  Continued Clinical Symptoms:  Depression:   Anhedonia Obsessive-Compulsive Disorder  Cognitive Features That Contribute To Risk:  Polarized thinking    Suicide Risk:  Mild:  Suicidal ideation of limited frequency, intensity, duration, and specificity.  There are no identifiable plans, no associated intent, mild dysphoria and related symptoms, good self-control (both objective and subjective  assessment), few other risk factors, and identifiable protective factors, including available and accessible social  support.  Follow-up Information    Follow up with Dr. Toy Care On 09/30/2015.   Why:  Your hospital follow up appointment will be Thursday June 8th at 5:15pm.    Contact information:   [address],  [place], [place] [address] Phone: [phone] Fax: [phone]      Plan Of Care/Follow-up recommendations:  Activity:  Activity as tolerated Diet:  Low-carb or heart healthy diet Other:  Patient will continue outpatient ECT treatment and will be seen next for treatment on Monday. Continue current medicine and follow-up with outpatient psychiatrist  Alethia Berthold, MD 09/24/2015, 12:24 PM

## 2015-09-24 NOTE — Progress Notes (Signed)
Patient's discharge teaching and instructions discussed with patient. He verbalized understanding. He was anxious and scheduled Ativan administered to help with the anxiety. Patient's belongings given back to patient including his CPAP machine. His glasses were kept by his wife at ECT. He did complain of a sore lower lip from where he bit self at ECT. MD Dr. Weber Cooks notified. Patient declined offered Ibuprofen and Tylenol, stated that he was "just fine." Patient was escorted to the [facility] lobby where his wife was waiting for him.

## 2015-09-24 NOTE — Anesthesia Postprocedure Evaluation (Signed)
Anesthesia Post Note  Patient: Matthew Patrick  Procedure(s) Performed: * No procedures listed *  Patient location during evaluation: PACU Anesthesia Type: General Level of consciousness: awake and alert Pain management: pain level controlled Vital Signs Assessment: post-procedure vital signs reviewed and stable Respiratory status: spontaneous breathing, nonlabored ventilation, respiratory function stable and patient connected to nasal cannula oxygen Cardiovascular status: blood pressure returned to baseline and stable Postop Assessment: no signs of nausea or vomiting Anesthetic complications: no    Last Vitals:  Filed Vitals:   09/24/15 1209 09/24/15 1219  BP: 161/83 162/83  Pulse: 86 84  Temp:    Resp: 14 14    Last Pain:  Filed Vitals:   09/24/15 1334  PainSc: 0-No pain                 Martha Clan

## 2015-09-24 NOTE — Progress Notes (Addendum)
  [facility] Adult Case Management Discharge Plan :  Will you be returning to the same living situation after discharge:  Yes,  home At discharge, do you have transportation home?: Yes,  wife Do you have the ability to pay for your medications: Yes,  insurance, income   Release of information consent forms completed and in the chart;  Patient's signature needed at discharge.  Patient to Follow up at: Follow-up Information    Follow up with Dr. Toy Care On 09/30/2015.   Why:  Your hospital follow up appointment will be Thursday June 8th at 5:15pm.    Contact information:   [address],  [place], [place] [address] Phone: [phone] Fax: [phone]      Follow up with Alethia Berthold, MD On 09/27/2015.   Specialty:  Psychiatry   Why:  Your next ECT treatment is on Monday June 5th.    Contact information:   [place] [address] [phone]       Next level of care provider has access to [place] and Suicide Prevention discussed: Yes,  with patient and wife  Have you used any form of tobacco in the last 30 days? (Cigarettes, Smokeless Tobacco, Cigars, and/or Pipes): No  Has patient been referred to the Quitline?: N/A patient is not a smoker  Patient has been referred for addiction treatment: [place] MSW, [place]  09/24/2015, 12:57 PM

## 2015-09-24 NOTE — Procedures (Signed)
ECT SERVICES Physician's Interval Evaluation & Treatment Note  Patient Identification: Matthew Patrick MRN:  [id number] Date of Evaluation:  09/24/2015 TX #: 1  MADRS: 44  MMSE: 30  P.E. Findings:  Vital signs stable. Sodium today 131. No new findings on physical exam. Lungs and heart normal.  Psychiatric Interval Note:  Depressed anxious very worried  Subjective:  Patient is a 70 y.o. male seen for evaluation for Electroconvulsive Therapy. Nervous  Treatment Summary:   [x]   Right Unilateral             []  Bilateral   % Energy : 0.3 ms 70%   Impedance: 620 ohms  Seizure Energy Index: 6963 V squared  Postictal Suppression Index: 44%   Seizure Concordance Index: 98%  Medications  Pre Shock: Brevital 70 mg, succinylcholine 80 mg  Post Shock: None  Seizure Duration: 44 seconds by EMG, 73 seconds by EEG    Comments: Treatment appears to have gone well. Plan for follow-up Monday Wednesday and Friday next week.    Lungs:  [x]   Clear to auscultation               []  Other:   Heart:    [x]   Regular rhythm             []  irregular rhythm    [x]   Previous H&P reviewed, patient examined and there are NO CHANGES                 []   Previous H&P reviewed, patient examined and there are changes noted.   Alethia Berthold, MD 6/2/201711:29 AM

## 2015-09-24 NOTE — Discharge Instructions (Signed)
1)  The drugs that you have been given will stay in your system until tomorrow so for the       next 24 hours you should not:  A. Drive an automobile  B. Make any legal decisions  C. Drink any alcoholic beverages  2)  You may resume your regular meals upon return home.  3)  A responsible adult must take you home.  Someone should stay with you for a few          hours, then be available by phone for the remainder of the treatment day.  4)  You May experience any of the following symptoms:  Headache, Nausea and a dry mouth (due to the medications you were given),  temporary memory loss and some confusion, or sore muscles (a warm bath  should help this).  If you you experience any of these symptoms let us know on                your return visit.  5)  Report any of the following: any acute discomfort, severe headache, or temperature        greater than 100.5 F.   Also report any unusual redness, swelling, drainage, or pain         at your IV site.    You may report Symptoms to:  [place] [facility]          Phone: [phone], ECT Department           or Dr. Prescott Gum office [phone]  6)  Your next ECT Treatment is Day Monday  Date June 5  We will call 2 days prior to your scheduled appointment for arrival times.  7)  Nothing to eat or drink after midnight the night before your procedure.  8)  Take   With a sip of water the morning of your procedure.  9)  Other Instructions: Call [phone] to cancel the morning of your procedure due         to illness or emergency.  10) We will call within 72 hours to assess how you are feeling.

## 2015-09-24 NOTE — Transfer of Care (Signed)
Immediate Anesthesia Transfer of Care Note  Patient: Matthew Patrick  Procedure(s) Performed: * No procedures listed *  Patient Location: PACU  Anesthesia Type:General  Level of Consciousness: patient cooperative and lethargic  Airway & Oxygen Therapy: Patient Spontanous Breathing and Patient connected to face mask oxygen  Post-op Assessment: Report given to RN and Post -op Vital signs reviewed and stable  Post vital signs: Reviewed and stable  Last Vitals:  Filed Vitals:   09/24/15 0833 09/24/15 1149  BP: 141/66 149/83  Pulse: 67 77  Temp: 36 C 37 C  Resp: 20 13    Last Pain:  Filed Vitals:   09/24/15 1150  PainSc: Asleep         Complications: No apparent anesthesia complications

## 2015-09-24 NOTE — Anesthesia Preprocedure Evaluation (Signed)
Anesthesia Evaluation  Patient identified by MRN, date of birth, ID band Patient awake    Reviewed: Allergy & Precautions, H&P , NPO status , Patient's Chart, lab work & pertinent test results, reviewed documented beta blocker date and time   History of Anesthesia Complications Negative for: history of anesthetic complications  Airway Mallampati: III  TM Distance: >3 FB Neck ROM: full    Dental no notable dental hx. (+) Teeth Intact, Caps   Pulmonary neg shortness of breath, asthma , sleep apnea and Continuous Positive Airway Pressure Ventilation , COPD,  COPD inhaler, former smoker,    Pulmonary exam normal breath sounds clear to auscultation       Cardiovascular Exercise Tolerance: Good hypertension, (-) angina(-) CAD, (-) Past MI, (-) Cardiac Stents and (-) CABG Normal cardiovascular exam(-) dysrhythmias (-) Valvular Problems/Murmurs Rhythm:regular Rate:Normal     Neuro/Psych PSYCHIATRIC DISORDERS (Depression and anxiety) negative neurological ROS     GI/Hepatic negative GI ROS, Neg liver ROS,   Endo/Other  negative endocrine ROS  Renal/GU negative Renal ROS  negative genitourinary   Musculoskeletal   Abdominal   Peds  Hematology negative hematology ROS (+)   Anesthesia Other Findings Past Medical History:   Hypertension                                                 Hyperlipemia                                                 Anxiety                                                      Asthma                                                       Depression                                                   Reproductive/Obstetrics negative OB ROS                             Anesthesia Physical Anesthesia Plan  ASA: III  Anesthesia Plan: General   Post-op Pain Management:    Induction:   Airway Management Planned:   Additional Equipment:   Intra-op Plan:   Post-operative  Plan:   Informed Consent: I have reviewed the patients History and Physical, chart, labs and discussed the procedure including the risks, benefits and alternatives for the proposed anesthesia with the patient or authorized representative who has indicated his/her understanding and acceptance.   Dental Advisory Given  Plan Discussed with: Anesthesiologist, CRNA and Surgeon  Anesthesia Plan Comments:  Anesthesia Quick Evaluation  

## 2015-09-24 NOTE — H&P (Signed)
Matthew Patrick is an 71 y.o. male.   Chief Complaint: Patient continues to feel very depressed and anxious particularly anxious. Negative. No active suicidal intent or plan and no obvious psychosis. Patient is beginning ECT today with first treatment recurrent severe depression and OCD HPI: Recurrent severe depression and OCD unresponsive to multiple medication trials current sodium today 131 stable  Past Medical History  Diagnosis Date  . Hypertension   . Hyperlipemia   . Anxiety   . Asthma   . Depression     Past Surgical History  Procedure Laterality Date  . Neck skin tag biopsy    . 2d echocardiogram  03/27/11  . Tonsillectomy and adenoidectomy    . Cataract extraction      Family History  Problem Relation Age of Onset  . Emphysema Mother   . Allergies Mother   . Heart disease Father    Social History:  reports that he quit smoking about 48 years ago. His smoking use included Cigarettes. He has a 5 pack-year smoking history. He does not have any smokeless tobacco history on file. He reports that he drinks about 4.2 oz of alcohol per week. He reports that he does not use illicit drugs.  Allergies: No Known Allergies  Medications Prior to Admission  Medication Sig Dispense Refill  . aspirin 81 MG tablet Take 81 mg by mouth daily.    . feeding supplement (BOOST / RESOURCE BREEZE) LIQD Take 1 Container by mouth 2 (two) times daily between meals. 60 Container 5  . fluticasone (FLONASE) 50 MCG/ACT nasal spray Place 2 sprays into both nostrils daily.    Marland Kitchen gabapentin (NEURONTIN) 300 MG capsule Take 300 mg by mouth at bedtime.    Marland Kitchen LORazepam (ATIVAN) 0.5 MG tablet Take 0.5 tablets (0.25 mg total) by mouth 4 (four) times daily. 30 tablet 0  . QUEtiapine (SEROQUEL) 100 MG tablet Take 150 mg by mouth at bedtime.      Results for orders placed or performed during the hospital encounter of 09/22/15 (from the past 48 hour(s))  Basic metabolic panel     Status: Abnormal   Collection Time:  09/23/15  7:47 AM  Result Value Ref Range   Sodium 131 (L) 135 - 145 mmol/L   Potassium 4.3 3.5 - 5.1 mmol/L   Chloride 98 (L) 101 - 111 mmol/L   CO2 27 22 - 32 mmol/L   Glucose, Bld 102 (H) 65 - 99 mg/dL   BUN 16 6 - 20 mg/dL   Creatinine, Ser 0.78 0.61 - 1.24 mg/dL   Calcium 9.4 8.9 - 10.3 mg/dL   GFR calc non Af Amer >60 >60 mL/min   GFR calc Af Amer >60 >60 mL/min    Comment: (NOTE) The eGFR has been calculated using the CKD EPI equation. This calculation has not been validated in all clinical situations. eGFR's persistently <60 mL/min signify possible Chronic Kidney Disease.    Anion gap 6 5 - 15  Urinalysis complete, with microscopic ([facility] only)     Status: Abnormal   Collection Time: 09/23/15  7:00 PM  Result Value Ref Range   Color, Urine YELLOW (A) YELLOW   APPearance CLEAR (A) CLEAR   Glucose, UA NEGATIVE NEGATIVE mg/dL   Bilirubin Urine NEGATIVE NEGATIVE   Ketones, ur NEGATIVE NEGATIVE mg/dL   Specific Gravity, Urine 1.019 1.005 - 1.030   Hgb urine dipstick NEGATIVE NEGATIVE   pH 5.0 5.0 - 8.0   Protein, ur NEGATIVE NEGATIVE mg/dL   Nitrite NEGATIVE  NEGATIVE   Leukocytes, UA NEGATIVE NEGATIVE   RBC / HPF 0-5 0 - 5 RBC/hpf   WBC, UA 0-5 0 - 5 WBC/hpf   Bacteria, UA NONE SEEN NONE SEEN   Squamous Epithelial / LPF 0-5 (A) NONE SEEN   Mucous PRESENT   Basic metabolic panel     Status: Abnormal   Collection Time: 09/24/15  7:37 AM  Result Value Ref Range   Sodium 131 (L) 135 - 145 mmol/L   Potassium 4.0 3.5 - 5.1 mmol/L   Chloride 96 (L) 101 - 111 mmol/L   CO2 24 22 - 32 mmol/L   Glucose, Bld 96 65 - 99 mg/dL   BUN 15 6 - 20 mg/dL   Creatinine, Ser 0.81 0.61 - 1.24 mg/dL   Calcium 9.2 8.9 - 10.3 mg/dL   GFR calc non Af Amer >60 >60 mL/min   GFR calc Af Amer >60 >60 mL/min    Comment: (NOTE) The eGFR has been calculated using the CKD EPI equation. This calculation has not been validated in all clinical situations. eGFR's persistently <60 mL/min signify  possible Chronic Kidney Disease.    Anion gap 11 5 - 15   No results found.  Review of Systems  Constitutional: Negative.   HENT: Negative.   Eyes: Negative.   Respiratory: Negative.   Cardiovascular: Negative.   Gastrointestinal: Negative.   Musculoskeletal: Negative.   Skin: Negative.   Neurological: Negative.   Psychiatric/Behavioral: Positive for depression and memory loss. Negative for suicidal ideas, hallucinations and substance abuse. The patient is nervous/anxious and has insomnia.     Blood pressure 141/66, pulse 67, temperature 96.8 F (36 C), temperature source Oral, resp. rate 20, height 5' 10"  (1.778 m), weight 73.029 kg (161 lb), SpO2 100 %. Physical Exam  Nursing note and vitals reviewed. Constitutional: He appears well-developed and well-nourished.  HENT:  Head: Normocephalic and atraumatic.  Eyes: Conjunctivae are normal. Pupils are equal, round, and reactive to light.  Neck: Normal range of motion.  Cardiovascular: Normal rate and normal heart sounds.   Respiratory: Effort normal. No respiratory distress.  GI: Soft.  Musculoskeletal: Normal range of motion.  Neurological: He is alert.  Skin: Skin is warm and dry.  Psychiatric: Thought content normal. His mood appears anxious. His speech is delayed. He is slowed. He expresses impulsivity. He exhibits a depressed mood. He exhibits abnormal recent memory.     Assessment/Plan Follow-up Monday for further treatment no change to medicine at discharge.  Alethia Berthold, MD 09/24/2015, 11:27 AM

## 2015-09-24 NOTE — Discharge Summary (Signed)
Physician Discharge Summary Note  Patient:  Matthew Patrick is an 70 y.o., male MRN:  [id number] DOB:  [birth year] Patient phone:  [phone] (home)  Patient address:   [place] [id number],  Total Time spent with patient: 35 minutes  Date of Admission:  09/22/2015 Date of Discharge: 09/24/2015  Reason for Admission:  Patient was admitted through the emergency room because of worsening depression with suicidal thoughts and increased anxiety. Initial plan was workup for ECT. Patient is now back to the psychiatry ward after a brief stay on the medicine unit for continuation of the plan to treat the depression  Principal Problem: Severe recurrent major depression without psychotic features [facility]) Discharge Diagnoses: Patient Active Problem List   Diagnosis Date Noted  . Generalized weakness [R53.1] 09/22/2015  . Depression [F32.9] 09/22/2015  . Anemia [D64.9] 09/22/2015  . Obsessive compulsive disorder [F42.9] 09/22/2015  . Generalized anxiety disorder [F41.1] 09/22/2015  . SIADH (syndrome of inappropriate ADH production) ([place]) [E22.2] 09/21/2015  . Hyponatremia [E87.1] 09/20/2015  . Severe recurrent major depression with psychotic features ([place]) [F33.3] 09/19/2015  . Severe recurrent major depression without psychotic features ([place]) [F33.2] 09/17/2015  . Suicidal ideation [R45.851] 09/17/2015  . Cough [R05] 01/19/2015  . Restless leg [G25.81] 01/19/2015  . COLD (chronic obstructive lung disease) ([place]) [J44.9] 05/22/2014  . Lung nodule < 6cm on CT [R91.1] 05/22/2014  . OSA (obstructive sleep apnea) [G47.33] 07/11/2013  . Lung nodule [R91.1] 05/11/2013  . Nocturnal hypoxemia [G47.34] 05/11/2013  . Obstructive lung disease ([place]) [J44.9] 04/17/2013  . Chronic cough [R05] 04/17/2013  . Syncope [R55] 03/25/2011  . Hypertension [I10] 03/25/2011  . Hypokalemia [E87.6] 03/25/2011  . Asthma [J45.909] 03/25/2011  . Sciatica [M54.30] 03/25/2011  . SOB (shortness of  breath) [R06.02] 03/25/2011    Past Psychiatric History: Long-standing history of obsessive-compulsive disorder and depression. Currently he has a depression that has been going on for many months and getting worse without response to medication. No prior history of ECT treatment but has been referred for ECT because of failure to respond other medication. No history of suicide attempts in the past but recently has been more worried about self injury  Past Medical History:  Past Medical History  Diagnosis Date  . Hypertension   . Hyperlipemia   . Anxiety   . Asthma   . Depression     Past Surgical History  Procedure Laterality Date  . Neck skin tag biopsy    . 2d echocardiogram  03/27/11  . Tonsillectomy and adenoidectomy    . Cataract extraction     Family History:  Family History  Problem Relation Age of Onset  . Emphysema Mother   . Allergies Mother   . Heart disease Father    Family Psychiatric  History: Positive for depression and anxiety Social History:  History  Alcohol Use  . 4.2 oz/week  . 7 Glasses of wine per week    Comment: 2-4 glasses of wine per night, none in 3 weeks     History  Drug Use No    Social History   Social History  . Marital Status: Significant Other    Spouse Name: N/A  . Number of Children: N/A  . Years of Education: N/A   Occupational History  . retired    Social History Main Topics  . Smoking status: Former Smoker -- 1.00 packs/day for 5 years    Types: Cigarettes    Quit date: 04/25/1967  . Smokeless tobacco:  None  . Alcohol Use: 4.2 oz/week    7 Glasses of wine per week     Comment: 2-4 glasses of wine per night, none in 3 weeks  . Drug Use: No  . Sexual Activity: No   Other Topics Concern  . None   Social History Narrative    Hospital Course:  Patient was initially admitted to the psychiatry service week ago but over the weekend he had worsening hyponatremia and was transferred to the medical service. Once that  stabilized she was transferred back to psychiatry to continue with plan for ECT. As far as psychiatric treatment I discontinued his Abilify and Cymbalta. I had started him on Luvox and Seroquel. The Luvox was thought to be a possible contributor to his hyponatremia and has been discontinued. He continues on his Seroquel. Because of the hyponatremia I am hesitant to restart any serotonin reuptake inhibitor at this point. He will be discharged on his current medicine. He had his first ECT treatment today and while he is complaining of anxiety he physically tolerated the treatment well. Patient has not engaged in any suicidal behavior in the hospital. Does not appear to be psychotic. He is extremely nervous and worried about things constantly. His sodium has improved with simple management and hydration after stopping the Luvox. Further checks of the sodium have been in the normal range.  Physical Findings: AIMS:  , ,  ,  ,    CIWA:    COWS:     Musculoskeletal: Strength & Muscle Tone: within normal limits Gait & Station: normal Patient leans: N/A  Psychiatric Specialty Exam: Physical Exam  Nursing note and vitals reviewed. Constitutional: He appears well-developed and well-nourished.  HENT:  Head: Normocephalic and atraumatic.  Eyes: Conjunctivae are normal. Pupils are equal, round, and reactive to light.  Neck: Normal range of motion.  Cardiovascular: Normal rate, regular rhythm and normal heart sounds.   Respiratory: Effort normal. No respiratory distress.  GI: Soft.  Musculoskeletal: Normal range of motion.  Neurological: He is alert.  Skin: Skin is warm and dry.  Psychiatric: Judgment and thought content normal. His affect is blunt. His speech is delayed. He is slowed. He exhibits a depressed mood. He exhibits abnormal recent memory.    Review of Systems  Constitutional: Negative.   HENT: Negative.   Eyes: Negative.   Respiratory: Negative.   Cardiovascular: Negative.    Gastrointestinal: Negative.   Musculoskeletal: Negative.   Skin: Negative.   Neurological: Negative.   Psychiatric/Behavioral: Positive for depression and memory loss. Negative for suicidal ideas, hallucinations and substance abuse. The patient is nervous/anxious and has insomnia.     Blood pressure 162/83, pulse 84, temperature 98.6 F (37 C), temperature source Temporal, resp. rate 14, height 5\' 10"  (1.778 m), weight 73.029 kg (161 lb), SpO2 96 %.Body mass index is 23.1 kg/(m^2).  General Appearance: Fairly Groomed  Eye Contact:  Fair  Speech:  Slow  Volume:  Decreased  Mood:  Anxious and Dysphoric  Affect:  Constricted  Thought Process:  Goal Directed  Orientation:  Full (Time, Place, and Person)  Thought Content:  Obsessions and Rumination  Suicidal Thoughts:  No  Homicidal Thoughts:  No  Memory:  Immediate;   Good Recent;   Fair Remote;   Fair  Judgement:  Fair  Insight:  Fair  Psychomotor Activity:  Decreased  Concentration:  Concentration: Fair  Recall:  [employer] of Knowledge:  Fair  Language:  Good  Akathisia:  No  Handed:  Right  AIMS (if indicated):     Assets:  Communication Skills Desire for Improvement Financial Resources/Insurance Housing Intimacy Physical Health Resilience Social Support  ADL's:  Intact  Cognition:  Impaired,  Mild  Sleep:  Number of Hours: 6.15     Have you used any form of tobacco in the last 30 days? (Cigarettes, Smokeless Tobacco, Cigars, and/or Pipes): No  Has this patient used any form of tobacco in the last 30 days? (Cigarettes, Smokeless Tobacco, Cigars, and/or Pipes) Yes, No  Blood Alcohol level:  Lab Results  Component Value Date   ETH <5 [id number]    Metabolic Disorder Labs:  Lab Results  Component Value Date   HGBA1C 5.7 09/17/2015   MPG 117* 03/26/2011   Lab Results  Component Value Date   PROLACTIN 18.6* 09/17/2015   Lab Results  Component Value Date   CHOL 173 09/17/2015   TRIG 140 09/17/2015    HDL 58 09/17/2015   CHOLHDL 3.0 09/17/2015   VLDL 28 09/17/2015   LDLCALC 87 09/17/2015   LDLCALC 43 03/26/2011   LDLCALC 46 03/26/2011    See Psychiatric Specialty Exam and Suicide Risk Assessment completed by Attending Physician prior to discharge.  Discharge destination:  Home  Is patient on multiple antipsychotic therapies at discharge:  No   Has Patient had three or more failed trials of antipsychotic monotherapy by history:  No  Recommended Plan for Multiple Antipsychotic Therapies: NA     Medication List    ASK your doctor about these medications      Indication   aspirin 81 MG tablet  Take 81 mg by mouth daily.      feeding supplement Liqd  Take 1 Container by mouth 2 (two) times daily between meals.      fluticasone 50 MCG/ACT nasal spray  Commonly known as:  FLONASE  Place 2 sprays into both nostrils daily.      gabapentin 300 MG capsule  Commonly known as:  NEURONTIN  Take 300 mg by mouth at bedtime.      LORazepam 0.5 MG tablet  Commonly known as:  ATIVAN  Take 0.5 tablets (0.25 mg total) by mouth 4 (four) times daily.      QUEtiapine 100 MG tablet  Commonly known as:  SEROQUEL  Take 150 mg by mouth at bedtime.            Follow-up Information    Follow up with Dr. Toy Care On 09/30/2015.   Why:  Your hospital follow up appointment will be Thursday June 8th at 5:15pm.    Contact information:   [address],  [place], [place] [address] Phone: [phone] Fax: [phone]      Follow-up recommendations:  Activity:  Activity as tolerated Diet:  Regular diet or heart healthy Other:  Continue ECT treatment next treatment scheduled for Monday. Continue outpatient medical treatment  Comments:  Patient is going to be discharged today to home. I will give a phone call to his outpatient psychiatrist updating her on the current situation. Continue his current medication. Prescriptions will be given at discharge.  Signed: Alethia Berthold,  MD 09/24/2015, 12:29 PM

## 2015-09-24 NOTE — Telephone Encounter (Signed)
lmtcb X3 for pt.  Will close per triage protocol.  

## 2015-09-24 NOTE — Progress Notes (Signed)
Recreation Therapy Notes  Date: 06.02.17 Time: 1:00 pm Location: Craft Room  Group Topic: Communication, Problem Solving, Teamwork  Goal Area(s) Addresses:  Patient will effectively work with peer towards shared goal. Patient will identify skills used to make activity successful. Patient will identify benefit of using group skills effectively post d/c.  Behavioral Response: Attentive, Interactive  Intervention: [employer]  Activity: Patients were instructed to build a free standing tower out of 15 pipe cleaners. After about 3 minutes of building, patients were told to put their dominant hand behind their back. After about 3 more minutes, patients were told to stop talking to each other.  Education: LRT educated patients on healthy support systems.  Education Outcome: Acknowledges education/In group clarification offered  Clinical Observations/Feedback: Patient completed activity by working with peers to build tower. Patient used effective communication, problem solving, and teamwork skills. Patient contributed to group discussion by stating what skills his team used, how he effectively used the skills. Patient left group at approximately 1:42 pm to eat lunch. Patient did not return to group.  Leonette Monarch, LRT/CTRS 09/24/2015 3:07 PM

## 2015-09-27 ENCOUNTER — Encounter
Admit: 2015-09-27 | Discharge: 2015-09-27 | Disposition: A | Discharge status: Home / Self Care | Payer: Medicare Other | Attending: Psychiatry | Admitting: Psychiatry

## 2015-09-27 ENCOUNTER — Other Ambulatory Visit: Payer: Self-pay | Admitting: Psychiatry

## 2015-09-27 ENCOUNTER — Encounter: Payer: Self-pay | Admitting: Anesthesiology

## 2015-09-27 DIAGNOSIS — F332 Major depressive disorder, recurrent severe without psychotic features: Secondary | ICD-10-CM

## 2015-09-27 DIAGNOSIS — J45909 Unspecified asthma, uncomplicated: Secondary | ICD-10-CM | POA: Diagnosis not present

## 2015-09-27 DIAGNOSIS — F419 Anxiety disorder, unspecified: Secondary | ICD-10-CM | POA: Diagnosis not present

## 2015-09-27 DIAGNOSIS — E785 Hyperlipidemia, unspecified: Secondary | ICD-10-CM | POA: Diagnosis not present

## 2015-09-27 DIAGNOSIS — Z87891 Personal history of nicotine dependence: Secondary | ICD-10-CM | POA: Diagnosis not present

## 2015-09-27 DIAGNOSIS — I10 Essential (primary) hypertension: Secondary | ICD-10-CM | POA: Diagnosis not present

## 2015-09-27 MED ORDER — FENTANYL CITRATE (PF) 100 MCG/2ML IJ SOLN: 25.0000 ug | INTRAMUSCULAR | Status: DC | PRN

## 2015-09-27 MED ORDER — ESMOLOL HCL 100 MG/10ML IV SOLN
INTRAVENOUS | Status: DC | PRN
  Administered 2015-09-27: 30 mg via INTRAVENOUS

## 2015-09-27 MED ORDER — SODIUM CHLORIDE 0.9 % IV SOLN
INTRAVENOUS | Status: DC | PRN
  Administered 2015-09-27: 10:00:00 via INTRAVENOUS

## 2015-09-27 MED ORDER — SUCCINYLCHOLINE CHLORIDE 20 MG/ML IJ SOLN
INTRAMUSCULAR | Status: DC | PRN
  Administered 2015-09-27: 80 mg via INTRAVENOUS

## 2015-09-27 MED ORDER — ONDANSETRON HCL 4 MG/2ML IJ SOLN: 4.0000 mg | Freq: Once | INTRAMUSCULAR | Status: DC | PRN

## 2015-09-27 MED ORDER — LABETALOL HCL 5 MG/ML IV SOLN
INTRAVENOUS | Status: DC | PRN
  Administered 2015-09-27: 10 mg via INTRAVENOUS

## 2015-09-27 MED ORDER — METHOHEXITAL SODIUM 100 MG/10ML IV SOSY
PREFILLED_SYRINGE | INTRAVENOUS | Status: DC | PRN
  Administered 2015-09-27: 70 mg via INTRAVENOUS

## 2015-09-27 MED ORDER — MIDAZOLAM HCL 5 MG/5ML IJ SOLN
1.0000 mg | Freq: Once | INTRAMUSCULAR | Status: AC
  Administered 2015-09-27: 1 mg via INTRAVENOUS

## 2015-09-27 MED ORDER — SODIUM CHLORIDE 0.9 % IV SOLN
250.0000 mL | Freq: Once | INTRAVENOUS | Status: AC
  Administered 2015-09-27: 500 mL via INTRAVENOUS

## 2015-09-27 NOTE — Anesthesia Procedure Notes (Signed)
Date/Time: 09/27/2015 10:30 AM Performed by: Nelda Marseille Pre-anesthesia Checklist: Patient identified, Timeout performed, Emergency Drugs available, Suction available and Patient being monitored Oxygen Delivery Method: Simple face mask and Ambu bag

## 2015-09-27 NOTE — H&P (Signed)
Matthew Patrick is an 70 y.o. male.   Chief Complaint: Patient was severe recurrent major depression also obsessive-compulsive disorder presents with depression that has not responded well to medicine. Continues to feel very anxious and depressed. HPI: Patient is very worried and nervous today. No specific new physical complaints other than the possibility of some movements around the face which we are monitoring.  Past Medical History  Diagnosis Date  . Hypertension   . Hyperlipemia   . Anxiety   . Asthma   . Depression     Past Surgical History  Procedure Laterality Date  . Neck skin tag biopsy    . 2d echocardiogram  03/27/11  . Tonsillectomy and adenoidectomy    . Cataract extraction      Family History  Problem Relation Age of Onset  . Emphysema Mother   . Allergies Mother   . Heart disease Father    Social History:  reports that he quit smoking about 48 years ago. His smoking use included Cigarettes. He has a 5 pack-year smoking history. He does not have any smokeless tobacco history on file. He reports that he drinks about 4.2 oz of alcohol per week. He reports that he does not use illicit drugs.  Allergies: No Known Allergies   (Not in a hospital admission)  No results found for this or any previous visit (from the past 48 hour(s)). No results found.  Review of Systems  Constitutional: Negative.   HENT: Negative.   Eyes: Negative.   Respiratory: Negative.   Cardiovascular: Negative.   Gastrointestinal: Negative.   Musculoskeletal: Negative.   Skin: Negative.   Neurological: Positive for tremors.       Both the patient and his wife are noticing abnormal movements around the mouth. Concern raised about tardive dyskinesia. I don't see anything. Doesn't meet it's not there.  Psychiatric/Behavioral: Positive for depression. Negative for suicidal ideas, hallucinations, memory loss and substance abuse. The patient is nervous/anxious and has insomnia.     Blood pressure  167/80, pulse 71, temperature 98.5 F (36.9 C), temperature source Oral, resp. rate 16, height 5\' 10"  (1.778 m), weight 71.215 kg (157 lb), SpO2 100 %. Physical Exam  Nursing note and vitals reviewed. Constitutional: He appears well-developed and well-nourished.  HENT:  Head: Normocephalic and atraumatic.  Eyes: Conjunctivae are normal. Pupils are equal, round, and reactive to light.  Neck: Normal range of motion.  Cardiovascular: Normal rate and normal heart sounds.   Respiratory: Effort normal and breath sounds normal. No respiratory distress.  GI: Soft.  Musculoskeletal: Normal range of motion.  Neurological: He is alert.  Skin: Skin is warm and dry.  Psychiatric: His speech is normal. Judgment and thought content normal. His mood appears anxious. He is slowed and withdrawn. Cognition and memory are normal. He exhibits a depressed mood.     Assessment/Plan ECT today and continue on 3 times a week schedule. Lots of reassurance to the patient. We discussed the possibility of discontinuing the Seroquel but the patient is even more nervous about not being able to sleep. For now I will encourage him to continue his medicine but I'm going to get a message to his outpatient psychiatrist letting her know about what has been going on and he does plan to see her this week.  Alethia Berthold, MD 09/27/2015, 10:13 AM

## 2015-09-27 NOTE — Transfer of Care (Signed)
Immediate Anesthesia Transfer of Care Note  Patient: Matthew Patrick  Procedure(s) Performed: ECT  Patient Location: PACU  Anesthesia Type:General  Level of Consciousness: awake and sedated  Airway & Oxygen Therapy: Patient Spontanous Breathing and Patient connected to face mask oxygen  Post-op Assessment: Report given to RN and Post -op Vital signs reviewed and stable  Post vital signs: Reviewed and stable  Last Vitals:  Filed Vitals:   09/27/15 0847  BP: 167/80  Pulse: 71  Temp: 36.9 C  Resp: 16    Last Pain: There were no vitals filed for this visit.       Complications: No apparent anesthesia complications

## 2015-09-27 NOTE — Anesthesia Preprocedure Evaluation (Signed)
Anesthesia Evaluation  Patient identified by MRN, date of birth, ID band Patient awake    Reviewed: Allergy & Precautions, H&P , NPO status , Patient's Chart, lab work & pertinent test results, reviewed documented beta blocker date and time   History of Anesthesia Complications Negative for: history of anesthetic complications  Airway Mallampati: III  TM Distance: >3 FB Neck ROM: full    Dental no notable dental hx. (+) Teeth Intact, Caps   Pulmonary neg shortness of breath, asthma , sleep apnea and Continuous Positive Airway Pressure Ventilation , COPD,  COPD inhaler, former smoker,    Pulmonary exam normal breath sounds clear to auscultation       Cardiovascular Exercise Tolerance: Good hypertension, (-) angina(-) CAD, (-) Past MI, (-) Cardiac Stents and (-) CABG Normal cardiovascular exam(-) dysrhythmias (-) Valvular Problems/Murmurs Rhythm:regular Rate:Normal     Neuro/Psych PSYCHIATRIC DISORDERS (Depression and anxiety) negative neurological ROS     GI/Hepatic negative GI ROS, Neg liver ROS,   Endo/Other  negative endocrine ROS  Renal/GU negative Renal ROS  negative genitourinary   Musculoskeletal   Abdominal   Peds  Hematology negative hematology ROS (+)   Anesthesia Other Findings Past Medical History:   Hypertension                                                 Hyperlipemia                                                 Anxiety                                                      Asthma                                                       Depression                                                   Reproductive/Obstetrics negative OB ROS                             Anesthesia Physical  Anesthesia Plan  ASA: III  Anesthesia Plan: General   Post-op Pain Management:    Induction:   Airway Management Planned:   Additional Equipment:   Intra-op Plan:    Post-operative Plan:   Informed Consent: I have reviewed the patients History and Physical, chart, labs and discussed the procedure including the risks, benefits and alternatives for the proposed anesthesia with the patient or authorized representative who has indicated his/her understanding and acceptance.   Dental Advisory Given  Plan Discussed with: Anesthesiologist, CRNA and Surgeon  Anesthesia Plan Comments:  Anesthesia Quick Evaluation  

## 2015-09-27 NOTE — Procedures (Signed)
ECT SERVICES Physician's Interval Evaluation & Treatment Note  Patient Identification: Matthew Patrick MRN:  [id number] Date of Evaluation:  09/27/2015 TX #: 2  MADRS:   MMSE:   P.E. Findings:  Systolic blood pressure was a little bit high otherwise physical exam was unchanged. He had had a small cut to his lip after our last treatment on Friday but that appears to of healed up  Psychiatric Interval Note:  Mood is extremely anxious and very worried still depressed.  Subjective:  Patient is a 71 y.o. male seen for evaluation for Electroconvulsive Therapy. Mostly anxious  Treatment Summary:   [x]   Right Unilateral             []  Bilateral   % Energy : 0.3 ms 70%   Impedance: 1020 ohms  Seizure Energy Index: 6971 V squared  Postictal Suppression Index: 64%  Seizure Concordance Index: 95%  Medications  Pre Shock: Brevital 70 mg, and neck teen 80 mg  Post Shock: Labetalol 10 mg  Seizure Duration: 40 seconds by EMG, 67 seconds by EEG   Comments: We will increase the Brevital to 80 mg for next treatment. Be on the lookout for possible need to use labetalol.   Lungs:  [x]   Clear to auscultation               []  Other:   Heart:    [x]   Regular rhythm             []  irregular rhythm    [x]   Previous H&P reviewed, patient examined and there are NO CHANGES                 []   Previous H&P reviewed, patient examined and there are changes noted.   Alethia Berthold, MD 6/5/201710:16 AM

## 2015-09-28 ENCOUNTER — Other Ambulatory Visit: Payer: Self-pay | Admitting: Psychiatry

## 2015-09-28 ENCOUNTER — Telehealth: Payer: Self-pay | Admitting: Pulmonary Disease

## 2015-09-28 DIAGNOSIS — F341 Dysthymic disorder: Secondary | ICD-10-CM | POA: Diagnosis not present

## 2015-09-28 NOTE — Telephone Encounter (Signed)
Pt states [facility] told him they need a new Rx for CPAP supplies. Pt is aware that I will contact [facility] to confirm this and make sure nothing else is needed as there are other forms that must be signed as well. Spoke with Jeani Hawking at [facility] and she confirmed that a CMN has been faxed to our office on 09-23-15 for RA to sign. Rodena Piety had the CMN- I have personally given the CMN to Sharyn Lull to have RA sign tomorrow morning and return to [place].    Pt was made aware that [facility] will contact him regarding supplies needed.

## 2015-09-29 ENCOUNTER — Encounter: Payer: Self-pay | Admitting: Certified Registered Nurse Anesthetist

## 2015-09-29 ENCOUNTER — Encounter (HOSPITAL_BASED_OUTPATIENT_CLINIC_OR_DEPARTMENT_OTHER)
Admission: RE | Admit: 2015-09-29 | Discharge: 2015-09-29 | Disposition: A | Discharge status: Home / Self Care | Payer: Medicare Other | Source: Ambulatory Visit | Attending: Psychiatry | Admitting: Psychiatry

## 2015-09-29 DIAGNOSIS — D649 Anemia, unspecified: Secondary | ICD-10-CM | POA: Diagnosis not present

## 2015-09-29 DIAGNOSIS — J45909 Unspecified asthma, uncomplicated: Secondary | ICD-10-CM | POA: Diagnosis not present

## 2015-09-29 DIAGNOSIS — F332 Major depressive disorder, recurrent severe without psychotic features: Secondary | ICD-10-CM | POA: Diagnosis not present

## 2015-09-29 DIAGNOSIS — F419 Anxiety disorder, unspecified: Secondary | ICD-10-CM | POA: Diagnosis not present

## 2015-09-29 DIAGNOSIS — I10 Essential (primary) hypertension: Secondary | ICD-10-CM | POA: Diagnosis not present

## 2015-09-29 DIAGNOSIS — Z87891 Personal history of nicotine dependence: Secondary | ICD-10-CM | POA: Diagnosis not present

## 2015-09-29 DIAGNOSIS — E785 Hyperlipidemia, unspecified: Secondary | ICD-10-CM | POA: Diagnosis not present

## 2015-09-29 LAB — SODIUM: Sodium: 134 mmol/L — ABNORMAL LOW (ref 135–145)

## 2015-09-29 MED ORDER — SODIUM CHLORIDE 0.9 % IV SOLN
INTRAVENOUS | Status: DC | PRN
  Administered 2015-09-29: 10:00:00 via INTRAVENOUS

## 2015-09-29 MED ORDER — METHOHEXITAL SODIUM 100 MG/10ML IV SOSY
PREFILLED_SYRINGE | INTRAVENOUS | Status: DC | PRN
  Administered 2015-09-29: 70 mg via INTRAVENOUS

## 2015-09-29 MED ORDER — MIDAZOLAM HCL 2 MG/2ML IJ SOLN: 2.0000 mg | Freq: Once | INTRAMUSCULAR | Status: DC

## 2015-09-29 MED ORDER — ONDANSETRON HCL 4 MG/2ML IJ SOLN
INTRAMUSCULAR | Status: AC
  Filled 2015-09-29: qty 2

## 2015-09-29 MED ORDER — SODIUM CHLORIDE 0.9 % IV SOLN
250.0000 mL | Freq: Once | INTRAVENOUS | Status: AC
  Administered 2015-09-29: 500 mL via INTRAVENOUS

## 2015-09-29 MED ORDER — ONDANSETRON HCL 4 MG/2ML IJ SOLN
4.0000 mg | Freq: Once | INTRAMUSCULAR | Status: AC
  Administered 2015-09-29: 09:00:00 via INTRAVENOUS

## 2015-09-29 MED ORDER — SUCCINYLCHOLINE CHLORIDE 20 MG/ML IJ SOLN
INTRAMUSCULAR | Status: DC | PRN
  Administered 2015-09-29: 80 mg via INTRAVENOUS

## 2015-09-29 NOTE — Anesthesia Postprocedure Evaluation (Signed)
Anesthesia Post Note  Patient: Matthew Patrick  Procedure(s) Performed: * No procedures listed *  Patient location during evaluation: PACU Anesthesia Type: General Level of consciousness: awake and alert and oriented Pain management: pain level controlled Vital Signs Assessment: post-procedure vital signs reviewed and stable Respiratory status: spontaneous breathing Cardiovascular status: blood pressure returned to baseline Anesthetic complications: no    Last Vitals:  Filed Vitals:   09/27/15 1108 09/27/15 1122  BP: 154/78 137/81  Pulse:  74  Temp:    Resp:  16    Last Pain: There were no vitals filed for this visit.               Hila Bolding

## 2015-09-29 NOTE — Discharge Instructions (Signed)
1)  The drugs that you have been given will stay in your system until tomorrow so for the       next 24 hours you should not:  A. Drive an automobile  B. Make any legal decisions  C. Drink any alcoholic beverages  2)  You may resume your regular meals upon return home.  3)  A responsible adult must take you home.  Someone should stay with you for a few          hours, then be available by phone for the remainder of the treatment day.  4)  You May experience any of the following symptoms:  Headache, Nausea and a dry mouth (due to the medications you were given),  temporary memory loss and some confusion, or sore muscles (a warm bath  should help this).  If you you experience any of these symptoms let us know on                your return visit.  5)  Report any of the following: any acute discomfort, severe headache, or temperature        greater than 100.5 F.   Also report any unusual redness, swelling, drainage, or pain         at your IV site.    You may report Symptoms to:  [place] [facility]          Phone: [phone], ECT Department           or Dr. Prescott Gum office [phone]  6)  Your next ECT Treatment is Friday October 01, 2015 at 8:30  We will call 2 days prior to your scheduled appointment for arrival times.  7)  Nothing to eat or drink after midnight the night before your procedure.  8)  Take nothing  the morning of your procedure.  9)  Other Instructions: Call [phone] to cancel the morning of your procedure due         to illness or emergency.  10) We will call within 72 hours to assess how you are feeling.

## 2015-09-29 NOTE — Transfer of Care (Signed)
Immediate Anesthesia Transfer of Care Note  Patient: Matthew Patrick  Procedure(s) Performed: ECT  Patient Location: PACU  Anesthesia Type:General  Level of Consciousness: awake and patient cooperative  Airway & Oxygen Therapy: Patient Spontanous Breathing  Post-op Assessment: Report given to RN  Post vital signs: Reviewed and stable  Last Vitals:  Filed Vitals:   09/29/15 0854 09/29/15 1039  BP: 138/73 152/84  Pulse: 64 80  Temp: 35.8 C 36.8 C  Resp: 16 12    Last Pain:  Filed Vitals:   09/29/15 1041  PainSc: Asleep         Complications: No apparent anesthesia complications

## 2015-09-29 NOTE — Discharge Instructions (Signed)
1)  The drugs that you have been given will stay in your system until tomorrow so for the       next 24 hours you should not:  A. Drive an automobile  B. Make any legal decisions  C. Drink any alcoholic beverages  2)  You may resume your regular meals upon return home.  3)  A responsible adult must take you home.  Someone should stay with you for a few          hours, then be available by phone for the remainder of the treatment day.  4)  You May experience any of the following symptoms:  Headache, Nausea and a dry mouth (due to the medications you were given),  temporary memory loss and some confusion, or sore muscles (a warm bath  should help this).  If you you experience any of these symptoms let us know on                your return visit.  5)  Report any of the following: any acute discomfort, severe headache, or temperature        greater than 100.5 F.   Also report any unusual redness, swelling, drainage, or pain         at your IV site.    You may report Symptoms to:  [place] [facility]          Phone: [phone], ECT Department           or Dr. Prescott Gum office [phone]  6)  Your next ECT Treatment is Day Wednesday Date 6/  We will call 2 days prior to your scheduled appointment for arrival times.  7)  Nothing to eat or drink after midnight the night before your procedure.  8)  Take .     With a sip of water the morning of your procedure.  9)  Other Instructions: Call [phone] to cancel the morning of your procedure due         to illness or emergency.  10) We will call within 72 hours to assess how you are feeling.

## 2015-09-29 NOTE — H&P (Signed)
Matthew Patrick is an 70 y.o. male.   Chief Complaint: Patient with recurrent severe depression and anxiety that had been getting worse without response to medicine. Today he reports that he is feeling better. Wife notes that she sees some improvement. Less anxious and less obviously dysphoric. Still feeling down. Little activity but better than before. No suicidal thoughts currently. He is not aware of significant memory impairment HPI: Patient will be having ECT again today. Has tolerated treatment well so far. Mood slightly improved. Plan will be to continue with treatment plan of 3 times a week treatment  Past Medical History  Diagnosis Date  . Hypertension   . Hyperlipemia   . Anxiety   . Asthma   . Depression     Past Surgical History  Procedure Laterality Date  . Neck skin tag biopsy    . 2d echocardiogram  03/27/11  . Tonsillectomy and adenoidectomy    . Cataract extraction      Family History  Problem Relation Age of Onset  . Emphysema Mother   . Allergies Mother   . Heart disease Father    Social History:  reports that he quit smoking about 48 years ago. His smoking use included Cigarettes. He has a 5 pack-year smoking history. He does not have any smokeless tobacco history on file. He reports that he drinks about 4.2 oz of alcohol per week. He reports that he does not use illicit drugs.  Allergies: No Known Allergies   (Not in a hospital admission)  No results found for this or any previous visit (from the past 48 hour(s)). No results found.  Review of Systems  Constitutional: Negative.   HENT: Negative.   Eyes: Negative.   Respiratory: Negative.   Cardiovascular: Negative.   Gastrointestinal: Negative.   Musculoskeletal: Negative.   Skin: Negative.   Neurological: Negative.   Psychiatric/Behavioral: Positive for depression. Negative for suicidal ideas, hallucinations, memory loss and substance abuse. The patient is nervous/anxious. The patient does not have  insomnia.     Blood pressure 138/73, pulse 64, temperature 96.4 F (35.8 C), temperature source Tympanic, resp. rate 16, weight 70.761 kg (156 lb), SpO2 100 %. Physical Exam  Nursing note and vitals reviewed. Constitutional: He appears well-developed and well-nourished.  HENT:  Head: Normocephalic and atraumatic.  Eyes: Conjunctivae are normal. Pupils are equal, round, and reactive to light.  Neck: Normal range of motion.  Cardiovascular: Normal rate and normal heart sounds.   Respiratory: Effort normal. No respiratory distress.  GI: Soft.  Musculoskeletal: Normal range of motion.  Neurological: He is alert.  Skin: Skin is warm and dry.  Psychiatric: Judgment and thought content normal. His mood appears anxious. His speech is delayed. He is slowed. Cognition and memory are normal. He exhibits a depressed mood.     Assessment/Plan Right unilateral ECT today return on Friday continue schedule at least into next week. He will see his primary psychiatrist tomorrow. I have faxed her a note updating her on what has happened with him since he came to our hospital  Alethia Berthold, MD 09/29/2015, 9:53 AM

## 2015-09-29 NOTE — Anesthesia Preprocedure Evaluation (Addendum)
Anesthesia Evaluation  Patient identified by MRN, date of birth, ID band Patient awake    Reviewed: Allergy & Precautions, H&P , NPO status , Patient's Chart, lab work & pertinent test results, reviewed documented beta blocker date and time   Airway Mallampati: II   Neck ROM: full    Dental  (+) Edentulous Upper, Edentulous Lower   Pulmonary neg pulmonary ROS, neg shortness of breath, asthma , sleep apnea and Continuous Positive Airway Pressure Ventilation , COPD,  COPD inhaler, former smoker,    Pulmonary exam normal        Cardiovascular hypertension, negative cardio ROS Normal cardiovascular exam Rhythm:regular Rate:Normal     Neuro/Psych PSYCHIATRIC DISORDERS  Neuromuscular disease negative neurological ROS  negative psych ROS   GI/Hepatic negative GI ROS, Neg liver ROS,   Endo/Other  negative endocrine ROS  Renal/GU negative Renal ROS  negative genitourinary   Musculoskeletal   Abdominal   Peds  Hematology negative hematology ROS (+) anemia ,   Anesthesia Other Findings Past Medical History:   Hypertension                                                 Hyperlipemia                                                 Anxiety                                                      Asthma                                                       Depression                                                 Past Surgical History:   Neck skin tag biopsy                                          2D Echocardiogram                                03/27/11     TONSILLECTOMY AND ADENOIDECTOMY                               CATARACT EXTRACTION                                         BMI  Body Mass Index   22.38 kg/m 2     Reproductive/Obstetrics                            Anesthesia Physical Anesthesia Plan  ASA: III  Anesthesia Plan: General   Post-op Pain Management:    Induction:   Airway  Management Planned:   Additional Equipment:   Intra-op Plan:   Post-operative Plan:   Informed Consent: I have reviewed the patients History and Physical, chart, labs and discussed the procedure including the risks, benefits and alternatives for the proposed anesthesia with the patient or authorized representative who has indicated his/her understanding and acceptance.   Dental Advisory Given  Plan Discussed with: CRNA  Anesthesia Plan Comments:         Anesthesia Quick Evaluation

## 2015-09-29 NOTE — Procedures (Signed)
ECT SERVICES Physician's Interval Evaluation & Treatment Note  Patient Identification: Matthew Patrick MRN:  [id number] Date of Evaluation:  09/29/2015 TX #: 3  MADRS:   MMSE:   P.E. Findings:  No noticeable change to physical exam. Blood pressure under good control. Lungs and heart clear.  Psychiatric Interval Note:  Patient continues to feel anxious although wife notes some improvement and he does not seem to be quite as edgy as before.  Subjective:  Patient is a 70 y.o. male seen for evaluation for Electroconvulsive Therapy. No specific complaint not noticing any memory problems  Treatment Summary:   [x]   Right Unilateral             []  Bilateral   % Energy : 0.3 ms 70%   Impedance: 710 ohms  Seizure Energy Index: 5663 V squared  Postictal Suppression Index: 83%  Seizure Concordance Index: 97%  Medications  Pre Shock: Brevital 70 mg succinylcholine 80 mg  Post Shock: Versed 2 mg  Seizure Duration: 35 seconds by EMG, 52 seconds by EEG   Comments: Next treatment Friday. He will be seeing his outpatient psychiatrist on Thursday.   Lungs:  [x]   Clear to auscultation               []  Other:   Heart:    [x]   Regular rhythm             []  irregular rhythm    [x]   Previous H&P reviewed, patient examined and there are NO CHANGES                 []   Previous H&P reviewed, patient examined and there are changes noted.   Alethia Berthold, MD 6/7/201710:21 AM

## 2015-09-29 NOTE — Anesthesia Procedure Notes (Signed)
Date/Time: 09/29/2015 10:29 AM Performed by: Dionne Bucy Pre-anesthesia Checklist: Patient identified, Emergency Drugs available, Suction available and Patient being monitored Patient Re-evaluated:Patient Re-evaluated prior to inductionOxygen Delivery Method: Circle system utilized Preoxygenation: Pre-oxygenation with 100% oxygen Intubation Type: IV induction Ventilation: Mask ventilation without difficulty and Mask ventilation throughout procedure Airway Equipment and Method: Bite block Placement Confirmation: positive ETCO2 Dental Injury: Teeth and Oropharynx as per pre-operative assessment

## 2015-10-01 ENCOUNTER — Encounter: Payer: Self-pay | Admitting: Anesthesiology

## 2015-10-01 ENCOUNTER — Encounter (HOSPITAL_BASED_OUTPATIENT_CLINIC_OR_DEPARTMENT_OTHER)
Admission: RE | Admit: 2015-10-01 | Discharge: 2015-10-01 | Disposition: A | Discharge status: Home / Self Care | Payer: Medicare Other | Source: Ambulatory Visit | Attending: Psychiatry | Admitting: Psychiatry

## 2015-10-01 ENCOUNTER — Other Ambulatory Visit: Payer: Self-pay | Admitting: Psychiatry

## 2015-10-01 DIAGNOSIS — I10 Essential (primary) hypertension: Secondary | ICD-10-CM | POA: Diagnosis not present

## 2015-10-01 DIAGNOSIS — F332 Major depressive disorder, recurrent severe without psychotic features: Secondary | ICD-10-CM

## 2015-10-01 DIAGNOSIS — J45909 Unspecified asthma, uncomplicated: Secondary | ICD-10-CM | POA: Diagnosis not present

## 2015-10-01 DIAGNOSIS — F419 Anxiety disorder, unspecified: Secondary | ICD-10-CM | POA: Diagnosis not present

## 2015-10-01 DIAGNOSIS — Z87891 Personal history of nicotine dependence: Secondary | ICD-10-CM | POA: Diagnosis not present

## 2015-10-01 DIAGNOSIS — E785 Hyperlipidemia, unspecified: Secondary | ICD-10-CM | POA: Diagnosis not present

## 2015-10-01 MED ORDER — SODIUM CHLORIDE 0.9 % IV SOLN
INTRAVENOUS | Status: DC | PRN
  Administered 2015-10-01: 11:00:00 via INTRAVENOUS

## 2015-10-01 MED ORDER — METHOHEXITAL SODIUM 100 MG/10ML IV SOSY
PREFILLED_SYRINGE | INTRAVENOUS | Status: DC | PRN
  Administered 2015-10-01: 70 mg via INTRAVENOUS

## 2015-10-01 MED ORDER — ONDANSETRON HCL 4 MG/2ML IJ SOLN
INTRAMUSCULAR | Status: AC
  Filled 2015-10-01: qty 2

## 2015-10-01 MED ORDER — MIDAZOLAM HCL 2 MG/2ML IJ SOLN: 2.0000 mg | Freq: Once | INTRAMUSCULAR | Status: DC

## 2015-10-01 MED ORDER — ONDANSETRON HCL 4 MG/2ML IJ SOLN
4.0000 mg | Freq: Once | INTRAMUSCULAR | Status: AC
  Administered 2015-10-01: 4 mg via INTRAVENOUS

## 2015-10-01 MED ORDER — SODIUM CHLORIDE 0.9 % IV SOLN
250.0000 mL | Freq: Once | INTRAVENOUS | Status: AC
  Administered 2015-10-01: 500 mL via INTRAVENOUS

## 2015-10-01 MED ORDER — SUCCINYLCHOLINE CHLORIDE 20 MG/ML IJ SOLN
INTRAMUSCULAR | Status: DC | PRN
  Administered 2015-10-01: 80 mg via INTRAVENOUS

## 2015-10-01 NOTE — Anesthesia Preprocedure Evaluation (Signed)
Anesthesia Evaluation  Patient identified by MRN, date of birth, ID band Patient awake    Reviewed: Allergy & Precautions, H&P , NPO status , Patient's Chart, lab work & pertinent test results, reviewed documented beta blocker date and time   History of Anesthesia Complications Negative for: history of anesthetic complications  Airway Mallampati: II  TM Distance: <3 FB Neck ROM: full    Dental  (+) Edentulous Upper, Edentulous Lower   Pulmonary neg shortness of breath, asthma , sleep apnea and Continuous Positive Airway Pressure Ventilation , COPD,  COPD inhaler, former smoker,    Pulmonary exam normal        Cardiovascular Exercise Tolerance: Good hypertension, (-) Past MI Normal cardiovascular exam Rhythm:regular Rate:Normal     Neuro/Psych PSYCHIATRIC DISORDERS  Neuromuscular disease negative neurological ROS     GI/Hepatic negative GI ROS, Neg liver ROS,   Endo/Other  negative endocrine ROS  Renal/GU negative Renal ROS  negative genitourinary   Musculoskeletal   Abdominal   Peds  Hematology negative hematology ROS (+) anemia ,   Anesthesia Other Findings Past Medical History:   Hypertension                                                 Hyperlipemia                                                 Anxiety                                                      Asthma                                                       Depression                                                 Past Surgical History:   Neck skin tag biopsy                                          2D Echocardiogram                                03/27/11     TONSILLECTOMY AND ADENOIDECTOMY                               CATARACT EXTRACTION  BMI    Body Mass Index   22.38 kg/m 2     Reproductive/Obstetrics                             Anesthesia Physical  Anesthesia Plan  ASA:  III  Anesthesia Plan: General   Post-op Pain Management:    Induction:   Airway Management Planned:   Additional Equipment:   Intra-op Plan:   Post-operative Plan:   Informed Consent: I have reviewed the patients History and Physical, chart, labs and discussed the procedure including the risks, benefits and alternatives for the proposed anesthesia with the patient or authorized representative who has indicated his/her understanding and acceptance.   Dental Advisory Given  Plan Discussed with: CRNA  Anesthesia Plan Comments:         Anesthesia Quick Evaluation

## 2015-10-01 NOTE — Discharge Instructions (Signed)
1)  The drugs that you have been given will stay in your system until tomorrow so for the       next 24 hours you should not:  A. Drive an automobile  B. Make any legal decisions  C. Drink any alcoholic beverages  2)  You may resume your regular meals upon return home.  3)  A responsible adult must take you home.  Someone should stay with you for a few          hours, then be available by phone for the remainder of the treatment day.  4)  You May experience any of the following symptoms:  Headache, Nausea and a dry mouth (due to the medications you were given),  temporary memory loss and some confusion, or sore muscles (a warm bath  should help this).  If you you experience any of these symptoms let us know on                your return visit.  5)  Report any of the following: any acute discomfort, severe headache, or temperature        greater than 100.5 F.   Also report any unusual redness, swelling, drainage, or pain         at your IV site.    You may report Symptoms to:  [place] at [facility]          Phone: [phone], ECT Department           or Dr. Prescott Gum office [phone]  6)  Your next ECT Treatment is Monday, June 12  We will call 2 days prior to your scheduled appointment for arrival times.  7)  Nothing to eat or drink after midnight the night before your procedure.  8)  Take .     With a sip of water the morning of your procedure.  9)  Other Instructions: Call [phone] to cancel the morning of your procedure due         to illness or emergency.  10) We will call within 72 hours to assess how you are feeling.

## 2015-10-01 NOTE — Transfer of Care (Signed)
Immediate Anesthesia Transfer of Care Note  Patient: Matthew Patrick  Procedure(s) Performed: * No procedures listed *  Patient Location: PACU  Anesthesia Type:General  Level of Consciousness: awake, alert , oriented and patient cooperative  Airway & Oxygen Therapy: Patient Spontanous Breathing and Patient connected to face mask oxygen  Post-op Assessment: Report given to RN, Post -op Vital signs reviewed and stable and Patient moving all extremities X 4  Post vital signs: Reviewed and stable  Last Vitals:  Filed Vitals:   10/01/15 0849  BP: 136/66  Pulse: 69  Temp: 35.7 C  Resp: 16    Last Pain: There were no vitals filed for this visit.       Complications: No apparent anesthesia complications

## 2015-10-01 NOTE — H&P (Signed)
Matthew Patrick is an 70 y.o. male.   Chief Complaint: Patient is still feeling anxious and depressed. HPI: Patient with major depression severe recurrent and chronic severe anxiety disorder and OCD.  Past Medical History  Diagnosis Date  . Hypertension   . Hyperlipemia   . Anxiety   . Asthma   . Depression     Past Surgical History  Procedure Laterality Date  . Neck skin tag biopsy    . 2d echocardiogram  03/27/11  . Tonsillectomy and adenoidectomy    . Cataract extraction      Family History  Problem Relation Age of Onset  . Emphysema Mother   . Allergies Mother   . Heart disease Father    Social History:  reports that he quit smoking about 48 years ago. His smoking use included Cigarettes. He has a 5 pack-year smoking history. He does not have any smokeless tobacco history on file. He reports that he drinks about 4.2 oz of alcohol per week. He reports that he does not use illicit drugs.  Allergies: No Known Allergies   (Not in a hospital admission)  No results found for this or any previous visit (from the past 48 hour(s)). No results found.  Review of Systems  Constitutional: Negative.   HENT: Negative.   Eyes: Negative.   Respiratory: Negative.   Cardiovascular: Negative.   Gastrointestinal: Negative.   Musculoskeletal: Negative.   Skin: Negative.   Neurological: Negative.   Psychiatric/Behavioral: Positive for depression. Negative for suicidal ideas, hallucinations, memory loss and substance abuse. The patient is nervous/anxious. The patient does not have insomnia.     Blood pressure 136/66, pulse 69, temperature 96.2 F (35.7 C), temperature source Tympanic, resp. rate 16, weight 71.668 kg (158 lb), SpO2 98 %. Physical Exam  Nursing note and vitals reviewed. Constitutional: He appears well-developed and well-nourished.  HENT:  Head: Normocephalic and atraumatic.  Eyes: Conjunctivae are normal. Pupils are equal, round, and reactive to light.  Neck: Normal  range of motion.  Cardiovascular: Regular rhythm and normal heart sounds.   Respiratory: Effort normal. No respiratory distress.  GI: Soft.  Musculoskeletal: Normal range of motion.  Neurological: He is alert.  Skin: Skin is warm and dry.  Psychiatric: Judgment and thought content normal. His mood appears anxious. His speech is delayed. He is slowed. He exhibits a depressed mood. He exhibits abnormal recent memory.     Assessment/Plan Today's treatment #4. We've seen some signs of little bit of improvement although subjectively he is still feeling bad. He did go to his outpatient psychiatrist yesterday and had some medication changes.  Alethia Berthold, MD 10/01/2015, 11:00 AM

## 2015-10-01 NOTE — Anesthesia Postprocedure Evaluation (Signed)
Anesthesia Post Note  Patient: Matthew Patrick  Procedure(s) Performed: * No procedures listed *  Patient location during evaluation: PACU Anesthesia Type: General Level of consciousness: awake and alert Pain management: pain level controlled Vital Signs Assessment: post-procedure vital signs reviewed and stable Respiratory status: spontaneous breathing, nonlabored ventilation, respiratory function stable and patient connected to nasal cannula oxygen Cardiovascular status: blood pressure returned to baseline and stable Postop Assessment: no signs of nausea or vomiting Anesthetic complications: no    Last Vitals:  Filed Vitals:   10/01/15 1148 10/01/15 1155  BP: 161/81 151/66  Pulse: 83 93  Temp:    Resp: 12 16    Last Pain: There were no vitals filed for this visit.               Precious Haws Ruqaya Strauss

## 2015-10-01 NOTE — Procedures (Signed)
ECT SERVICES Physician's Interval Evaluation & Treatment Note  Patient Identification: Matthew Patrick MRN:  [id number] Date of Evaluation:  10/01/2015 TX #: 4  MADRS: 37  MMSE: 30  P.E. Findings:  Physically stable vital signs stable no new findings.  Psychiatric Interval Note:  Depressed and anxious about suicidal  Subjective:  Patient is a 70 y.o. male seen for evaluation for Electroconvulsive Therapy. Subjectively feeling down  Treatment Summary:   [x]   Right Unilateral             []  Bilateral   % Energy : 0.3 ms 70%   Impedance: 1380 ohms  Seizure Energy Index: Computer did not read correctly but there was a very obvious and fairly high energy seizure  Postictal Suppression Index: Did not read correctly  Seizure Concordance Index: Also did not read correctly  Medications  Pre Shock: Brevital 70 mg succinylcholine 80 mg  Post Shock: Versed 2 mg  Seizure Duration: 36 seconds by EMG 47 seconds by EEG   Comments: Continue treatment and in next week scheduled Monday Wednesday and Friday   Lungs:  []   Clear to auscultation               []  Other:   Heart:    []   Regular rhythm             []  irregular rhythm    []   Previous H&P reviewed, patient examined and there are NO CHANGES                 []   Previous H&P reviewed, patient examined and there are changes noted.   Alethia Berthold, MD 6/9/201711:01 AM

## 2015-10-04 ENCOUNTER — Encounter (HOSPITAL_BASED_OUTPATIENT_CLINIC_OR_DEPARTMENT_OTHER)
Admission: RE | Admit: 2015-10-04 | Discharge: 2015-10-04 | Disposition: A | Discharge status: Home / Self Care | Payer: Medicare Other | Source: Ambulatory Visit | Attending: Psychiatry | Admitting: Psychiatry

## 2015-10-04 ENCOUNTER — Encounter: Payer: Self-pay | Admitting: Certified Registered Nurse Anesthetist

## 2015-10-04 ENCOUNTER — Other Ambulatory Visit: Payer: Self-pay | Admitting: Psychiatry

## 2015-10-04 DIAGNOSIS — J45909 Unspecified asthma, uncomplicated: Secondary | ICD-10-CM | POA: Diagnosis not present

## 2015-10-04 DIAGNOSIS — F418 Other specified anxiety disorders: Secondary | ICD-10-CM | POA: Diagnosis not present

## 2015-10-04 DIAGNOSIS — I10 Essential (primary) hypertension: Secondary | ICD-10-CM | POA: Diagnosis not present

## 2015-10-04 DIAGNOSIS — F332 Major depressive disorder, recurrent severe without psychotic features: Secondary | ICD-10-CM

## 2015-10-04 DIAGNOSIS — Z87891 Personal history of nicotine dependence: Secondary | ICD-10-CM | POA: Diagnosis not present

## 2015-10-04 DIAGNOSIS — F419 Anxiety disorder, unspecified: Secondary | ICD-10-CM | POA: Diagnosis not present

## 2015-10-04 DIAGNOSIS — E785 Hyperlipidemia, unspecified: Secondary | ICD-10-CM | POA: Diagnosis not present

## 2015-10-04 MED ORDER — ONDANSETRON HCL 4 MG/2ML IJ SOLN: 4.0000 mg | Freq: Once | INTRAMUSCULAR | Status: DC | PRN

## 2015-10-04 MED ORDER — METHOHEXITAL SODIUM 100 MG/10ML IV SOSY
PREFILLED_SYRINGE | INTRAVENOUS | Status: DC | PRN
  Administered 2015-10-04: 70 mg via INTRAVENOUS

## 2015-10-04 MED ORDER — SUCCINYLCHOLINE CHLORIDE 20 MG/ML IJ SOLN
INTRAMUSCULAR | Status: DC | PRN
  Administered 2015-10-04: 80 mg via INTRAVENOUS

## 2015-10-04 MED ORDER — SODIUM CHLORIDE 0.9 % IV SOLN
250.0000 mL | Freq: Once | INTRAVENOUS | Status: AC
Start: 2015-10-04 — End: 2015-10-04
  Administered 2015-10-04: 500 mL via INTRAVENOUS

## 2015-10-04 MED ORDER — FENTANYL CITRATE (PF) 100 MCG/2ML IJ SOLN: 25.0000 ug | INTRAMUSCULAR | Status: DC | PRN

## 2015-10-04 MED ORDER — MIDAZOLAM HCL 2 MG/2ML IJ SOLN: 2.0000 mg | Freq: Once | INTRAMUSCULAR | Status: DC

## 2015-10-04 MED ORDER — MIDAZOLAM HCL 2 MG/2ML IJ SOLN
INTRAMUSCULAR | Status: DC | PRN
  Administered 2015-10-04: 2 mg via INTRAVENOUS

## 2015-10-04 NOTE — Transfer of Care (Signed)
Immediate Anesthesia Transfer of Care Note  Patient: Matthew Patrick  Procedure(s) Performed: * No procedures listed *  Patient Location: PACU  Anesthesia Type:General  Level of Consciousness: sedated  Airway & Oxygen Therapy: Patient Spontanous Breathing and Patient connected to face mask oxygen  Post-op Assessment: Report given to RN and Post -op Vital signs reviewed and stable  Post vital signs: Reviewed and stable  Last Vitals:  Filed Vitals:   10/04/15 0843  BP: 129/69  Pulse: 67  Temp: 36.7 C  Resp: 16    Last Pain: There were no vitals filed for this visit.       Complications: No apparent anesthesia complications

## 2015-10-04 NOTE — Anesthesia Preprocedure Evaluation (Signed)
Anesthesia Evaluation  Patient identified by MRN, date of birth, ID band Patient awake    Reviewed: Allergy & Precautions, H&P , NPO status , Patient's Chart, lab work & pertinent test results, reviewed documented beta blocker date and time   History of Anesthesia Complications Negative for: history of anesthetic complications  Airway Mallampati: II  TM Distance: <3 FB Neck ROM: full    Dental  (+) Edentulous Upper, Edentulous Lower   Pulmonary shortness of breath and with exertion, asthma , sleep apnea and Continuous Positive Airway Pressure Ventilation , COPD,  COPD inhaler, former smoker,    Pulmonary exam normal        Cardiovascular Exercise Tolerance: Good hypertension, (-) Past MI Normal cardiovascular exam Rhythm:regular Rate:Normal     Neuro/Psych PSYCHIATRIC DISORDERS Anxiety Depression sciatica  Neuromuscular disease negative neurological ROS     GI/Hepatic negative GI ROS, Neg liver ROS,   Endo/Other  negative endocrine ROS  Renal/GU negative Renal ROS  negative genitourinary   Musculoskeletal negative musculoskeletal ROS (+)   Abdominal   Peds negative pediatric ROS (+)  Hematology negative hematology ROS (+) anemia ,   Anesthesia Other Findings Past Medical History:   Hypertension                                                 Hyperlipemia                                                 Anxiety                                                      Asthma                                                       Depression                                                 Past Surgical History:   Neck skin tag biopsy                                          2D Echocardiogram                                03/27/11     TONSILLECTOMY AND ADENOIDECTOMY                               CATARACT EXTRACTION  BMI    Body Mass Index   22.38 kg/m 2     Reproductive/Obstetrics                             Anesthesia Physical  Anesthesia Plan  ASA: III  Anesthesia Plan: General   Post-op Pain Management:    Induction:   Airway Management Planned:   Additional Equipment:   Intra-op Plan:   Post-operative Plan:   Informed Consent: I have reviewed the patients History and Physical, chart, labs and discussed the procedure including the risks, benefits and alternatives for the proposed anesthesia with the patient or authorized representative who has indicated his/her understanding and acceptance.   Dental Advisory Given  Plan Discussed with: CRNA  Anesthesia Plan Comments:         Anesthesia Quick Evaluation

## 2015-10-04 NOTE — Discharge Instructions (Signed)
1)  The drugs that you have been given will stay in your system until tomorrow so for the       next 24 hours you should not:  A. Drive an automobile  B. Make any legal decisions  C. Drink any alcoholic beverages  2)  You may resume your regular meals upon return home.  3)  A responsible adult must take you home.  Someone should stay with you for a few      .    hours, then be available by phone for the remainder of the treatment day.  4)  You May experience any of the following symptoms:  Headache, Nausea and a dry mouth (due to the medications you were given),  temporary memory loss and some confusion, or sore muscles (a warm bath  should help this).  If you you experience any of these symptoms let us know on                your return visit.  5)  Report any of the following: any acute discomfort, severe headache, or temperature        greater than 100.5 F.   Also report any unusual redness, swelling, drainage, or pain         at your IV site.    You may report Symptoms to:  [place] at [facility]          Phone: [phone], ECT Department           or Dr. Prescott Gum office [phone]  6)  Your next ECT Treatment is Day Wednesday  Date June 14  We will call 2 days prior to your scheduled appointment for arrival times.  7)  Nothing to eat or drink after midnight the night before your procedure.  8)  Take Omeprazole     With a sip of water the morning of your procedure.  9)  Other Instructions: Call [phone] to cancel the morning of your procedure due         to illness or emergency.  10) We will call within 72 hours to assess how you are feeling. 1)  The drugs that you have been given will stay in your system until tomorrow so for the       next 24 hours you should not:  A. Drive an automobile  B. Make any legal decisions  C. Drink any alcoholic beverages  2)  You may resume your regular meals upon return home.  3)  A responsible adult must take you home.  Someone  should stay with you for a few          hours, then be available by phone for the remainder of the treatment day.  4)  You May experience any of the following symptoms:  Headache, Nausea and a dry mouth (due to the medications you were given),  temporary memory loss and some confusion, or sore muscles (a warm bath  should help this).  If you you experience any of these symptoms let us know on                your return visit.  5)  Report any of the following: any acute discomfort, severe headache, or temperature        greater than 100.5 F.   Also report any unusual redness, swelling, drainage, or pain         at your IV site.    You may report Symptoms  to:  [place] at [facility]          Phone: [phone], ECT Department           or Dr. Prescott Gum office [phone]  6)  Your next ECT Treatment is Day Wednesday  Date June 14  We will call 2 days prior to your scheduled appointment for arrival times.  7)  Nothing to eat or drink after midnight the night before your procedure.  8)  Take Omeprazole   With a sip of water the morning of your procedure.  9)  Other Instructions: Call [phone] to cancel the morning of your procedure due         to illness or emergency.  10) We will call within 72 hours to assess how you are feeling. Steps to Quit Smoking  Smoking tobacco can be harmful to your health and can affect almost every organ in your body. Smoking puts you, and those around you, at risk for developing many serious chronic diseases. Quitting smoking is difficult, but it is one of the best things that you can do for your health. It is never too late to quit. WHAT ARE THE BENEFITS OF QUITTING SMOKING? When you quit smoking, you lower your risk of developing serious diseases and conditions, such as:  Lung cancer or lung disease, such as COPD.  Heart disease.  Stroke.  Heart attack.  Infertility.  Osteoporosis and bone fractures. Additionally, symptoms such as  coughing, wheezing, and shortness of breath may get better when you quit. You may also find that you get sick less often because your body is stronger at fighting off colds and infections. If you are pregnant, quitting smoking can help to reduce your chances of having a baby of low birth weight. HOW DO I GET READY TO QUIT? When you decide to quit smoking, create a plan to make sure that you are successful. Before you quit:  Pick a date to quit. Set a date within the next two weeks to give you time to prepare.  Write down the reasons why you are quitting. Keep this list in places where you will see it often, such as on your bathroom mirror or in your car or wallet.  Identify the people, places, things, and activities that make you want to smoke (triggers) and avoid them. Make sure to take these actions:  Throw away all cigarettes at home, at work, and in your car.  Throw away smoking accessories, such as Scientist, research (medical).  Clean your car and make sure to empty the ashtray.  Clean your home, including curtains and carpets.  Tell your family, friends, and coworkers that you are quitting. Support from your loved ones can make quitting easier.  Talk with your health care provider about your options for quitting smoking.  Find out what treatment options are covered by your health insurance. WHAT STRATEGIES CAN I USE TO QUIT SMOKING?  Talk with your healthcare provider about different strategies to quit smoking. Some strategies include:  Quitting smoking altogether instead of gradually lessening how much you smoke over a period of time. Research shows that quitting "cold [place]" is more successful than gradually quitting.  Attending in-person counseling to help you build problem-solving skills. You are more likely to have success in quitting if you attend several counseling sessions. Even short sessions of 10 minutes can be effective.  Finding resources and support systems that can help  you to quit smoking and remain smoke-free after you quit.  These resources are most helpful when you use them often. They can include:  Online chats with a Social worker.  Telephone quitlines.  Printed Furniture conservator/restorer.  Support groups or group counseling.  Text messaging programs.  Mobile phone applications.  Taking medicines to help you quit smoking. (If you are pregnant or breastfeeding, talk with your health care provider first.) Some medicines contain nicotine and some do not. Both types of medicines help with cravings, but the medicines that include nicotine help to relieve withdrawal symptoms. Your health care provider may recommend:  Nicotine patches, gum, or lozenges.  Nicotine inhalers or sprays.  Non-nicotine medicine that is taken by mouth. Talk with your health care provider about combining strategies, such as taking medicines while you are also receiving in-person counseling. Using these two strategies together makes you more likely to succeed in quitting than if you used either strategy on its own. If you are pregnant or breastfeeding, talk with your health care provider about finding counseling or other support strategies to quit smoking. Do not take medicine to help you quit smoking unless told to do so by your health care provider. WHAT THINGS CAN I DO TO MAKE IT EASIER TO QUIT? Quitting smoking might feel overwhelming at first, but there is a lot that you can do to make it easier. Take these important actions:  Reach out to your family and friends and ask that they support and encourage you during this time. Call telephone quitlines, reach out to support groups, or work with a counselor for support.  Ask people who smoke to avoid smoking around you.  Avoid places that trigger you to smoke, such as bars, parties, or smoke-break areas at work.  Spend time around people who do not smoke.  Lessen stress in your life, because stress can be a smoking trigger for some  people. To lessen stress, try:  Exercising regularly.  Deep-breathing exercises.  Yoga.  Meditating.  Performing a body scan. This involves closing your eyes, scanning your body from head to toe, and noticing which parts of your body are particularly tense. Purposefully relax the muscles in those areas.  Download or purchase mobile phone or tablet apps (applications) that can help you stick to your quit plan by providing reminders, tips, and encouragement. There are many free apps, such as QuitGuide from the [employer] Office manager for Disease Control and Prevention). You can find other support for quitting smoking (smoking cessation) through smokefree.gov and other websites. HOW WILL I FEEL WHEN I QUIT SMOKING? Within the first 24 hours of quitting smoking, you may start to feel some withdrawal symptoms. These symptoms are usually most noticeable 2-3 days after quitting, but they usually do not last beyond 2-3 weeks. Changes or symptoms that you might experience include:  Mood swings.  Restlessness, anxiety, or irritation.  Difficulty concentrating.  Dizziness.  Strong cravings for sugary foods in addition to nicotine.  Mild weight gain.  Constipation.  Nausea.  Coughing or a sore throat.  Changes in how your medicines work in your body.  A depressed mood.  Difficulty sleeping (insomnia). After the first 2-3 weeks of quitting, you may start to notice more positive results, such as:  Improved sense of smell and taste.  Decreased coughing and sore throat.  Slower heart rate.  Lower blood pressure.  Clearer skin.  The ability to breathe more easily.  Fewer sick days. Quitting smoking is very challenging for most people. Do not get discouraged if you are not successful the first time.  Some people need to make many attempts to quit before they achieve long-term success. Do your best to stick to your quit plan, and talk with your health care provider if you have any questions or  concerns.   This information is not intended to replace advice given to you by your health care provider. Make sure you discuss any questions you have with your health care provider.   Document Released: 04/04/2001 Document Revised: 08/25/2014 Document Reviewed: 08/25/2014 Elsevier Interactive Patient Education [employer].

## 2015-10-04 NOTE — H&P (Signed)
Matthew Patrick is an 70 y.o. male.   Chief Complaint: Mood worsened over the weekend. More depressed. More down. Hopeless. Lack of motivation. Low energy. Not aware of having significant memory impairment. HPI: Patient was severe recurrent major depression  Past Medical History  Diagnosis Date  . Hypertension   . Hyperlipemia   . Anxiety   . Asthma   . Depression     Past Surgical History  Procedure Laterality Date  . Neck skin tag biopsy    . 2d echocardiogram  03/27/11  . Tonsillectomy and adenoidectomy    . Cataract extraction      Family History  Problem Relation Age of Onset  . Emphysema Mother   . Allergies Mother   . Heart disease Father    Social History:  reports that he quit smoking about 48 years ago. His smoking use included Cigarettes. He has a 5 pack-year smoking history. He does not have any smokeless tobacco history on file. He reports that he drinks about 4.2 oz of alcohol per week. He reports that he does not use illicit drugs.  Allergies: No Known Allergies   (Not in a hospital admission)  No results found for this or any previous visit (from the past 48 hour(s)). No results found.  Review of Systems  Constitutional: Negative.   HENT: Negative.   Eyes: Negative.   Respiratory: Negative.   Cardiovascular: Negative.   Gastrointestinal: Negative.   Musculoskeletal: Negative.   Skin: Negative.   Neurological: Negative.   Psychiatric/Behavioral: Positive for depression. Negative for suicidal ideas, hallucinations, memory loss and substance abuse. The patient is nervous/anxious and has insomnia.     Blood pressure 129/69, pulse 67, temperature 98.1 F (36.7 C), temperature source Oral, resp. rate 16, height 5\' 10"  (1.778 m), weight 71.668 kg (158 lb), SpO2 99 %. Physical Exam  Nursing note and vitals reviewed. Constitutional: He appears well-developed and well-nourished.  HENT:  Head: Normocephalic and atraumatic.  Eyes: Conjunctivae are normal.  Pupils are equal, round, and reactive to light.  Neck: Normal range of motion.  Cardiovascular: Normal rate and normal heart sounds.   Respiratory: Effort normal. No respiratory distress.  GI: Soft.  Musculoskeletal: Normal range of motion.  Neurological: He is alert.  Skin: Skin is warm and dry.  Psychiatric: Judgment and thought content normal. His speech is delayed. He is slowed. Cognition and memory are normal. He exhibits a depressed mood.     Assessment/Plan Patient has had 4 treatments and had looked like he may have had slight improvement but this weekend is feeling worse. He talk with his outpatient psychiatrist who brought up the possibility of bilateral ECT. The patient and I discussed pros and cons of this and he prefers to switch to bilateral ECT particularly in light of the fact that he has had minimal to no cognitive side effects so far. Therefore today we are switching to bilateral treatment. Otherwise treatment plan unchanged.  Alethia Berthold, MD 10/04/2015, 10:05 AM

## 2015-10-04 NOTE — Procedures (Signed)
ECT SERVICES Physician's Interval Evaluation & Treatment Note  Patient Identification: Matthew Patrick MRN:  [id number] Date of Evaluation:  10/04/2015 TX #: 5  MADRS:   MMSE:   P.E. Findings:  No change to physical exam vitals okay. Lungs and heart normal exam.  Psychiatric Interval Note:  He had a relapse of depression feeling worse this weekend. Not suicidal but more down and hopeless. He discussed changing to bilateral treatment with his outpatient psychiatrist. He and I discussed the pros and cons of the change today.  Subjective:  Patient is a 70 y.o. male seen for evaluation for Electroconvulsive Therapy. Feeling more depressed also less energetic and less motivated and less interested  Treatment Summary:   []   Right Unilateral             [x]  Bilateral   % Energy : 1.0 ms, 70%   Impedance: 1450 ohms  Seizure Energy Index: 8786 V squared  Postictal Suppression Index: 84%  Seizure Concordance Index: 95%  Medications  Pre Shock: Zofran 4 mg, Brevital 70 mg, succinylcholine 80 mg  Post Shock: Versed 2 mg  Seizure Duration: 31 seconds by EMG, 47 seconds by EEG   Comments: Today we have switch to bilateral treatment to improve efficacy. Patient is aware that there is a risk of more memory loss or confusion. He gives consent knowing the risks and benefits. Continue 3 times a week treatment at this point. No other change to treatment plan.   Lungs:  [x]   Clear to auscultation               []  Other:   Heart:    [x]   Regular rhythm             []  irregular rhythm    [x]   Previous H&P reviewed, patient examined and there are NO CHANGES                 []   Previous H&P reviewed, patient examined and there are changes noted.   Alethia Berthold, MD 6/12/201710:07 AM

## 2015-10-06 ENCOUNTER — Encounter (HOSPITAL_BASED_OUTPATIENT_CLINIC_OR_DEPARTMENT_OTHER)
Admission: RE | Admit: 2015-10-06 | Discharge: 2015-10-06 | Disposition: A | Discharge status: Home / Self Care | Payer: Medicare Other | Source: Ambulatory Visit | Attending: Psychiatry | Admitting: Psychiatry

## 2015-10-06 ENCOUNTER — Other Ambulatory Visit: Payer: Self-pay | Admitting: Psychiatry

## 2015-10-06 ENCOUNTER — Encounter: Payer: Self-pay | Admitting: Anesthesiology

## 2015-10-06 DIAGNOSIS — F332 Major depressive disorder, recurrent severe without psychotic features: Secondary | ICD-10-CM | POA: Diagnosis not present

## 2015-10-06 DIAGNOSIS — F419 Anxiety disorder, unspecified: Secondary | ICD-10-CM | POA: Diagnosis not present

## 2015-10-06 DIAGNOSIS — Z87891 Personal history of nicotine dependence: Secondary | ICD-10-CM | POA: Diagnosis not present

## 2015-10-06 DIAGNOSIS — F418 Other specified anxiety disorders: Secondary | ICD-10-CM | POA: Diagnosis not present

## 2015-10-06 DIAGNOSIS — J45909 Unspecified asthma, uncomplicated: Secondary | ICD-10-CM | POA: Diagnosis not present

## 2015-10-06 DIAGNOSIS — E785 Hyperlipidemia, unspecified: Secondary | ICD-10-CM | POA: Diagnosis not present

## 2015-10-06 DIAGNOSIS — I10 Essential (primary) hypertension: Secondary | ICD-10-CM | POA: Diagnosis not present

## 2015-10-06 HISTORY — DX: Sleep apnea, unspecified: G47.30

## 2015-10-06 MED ORDER — ONDANSETRON HCL 4 MG/2ML IJ SOLN
4.0000 mg | Freq: Once | INTRAMUSCULAR | Status: AC
  Administered 2015-10-06: 4 mg via INTRAVENOUS

## 2015-10-06 MED ORDER — SODIUM CHLORIDE 0.9 % IV SOLN
250.0000 mL | Freq: Once | INTRAVENOUS | Status: AC
  Administered 2015-10-06: 500 mL via INTRAVENOUS

## 2015-10-06 MED ORDER — SUCCINYLCHOLINE CHLORIDE 200 MG/10ML IV SOSY
PREFILLED_SYRINGE | INTRAVENOUS | Status: DC | PRN
  Administered 2015-10-06: 80 mg via INTRAVENOUS

## 2015-10-06 MED ORDER — METHOHEXITAL SODIUM 100 MG/10ML IV SOSY
PREFILLED_SYRINGE | INTRAVENOUS | Status: DC | PRN
  Administered 2015-10-06: 70 mg via INTRAVENOUS

## 2015-10-06 MED ORDER — ONDANSETRON HCL 4 MG/2ML IJ SOLN
INTRAMUSCULAR | Status: AC
  Administered 2015-10-06: 4 mg via INTRAVENOUS
  Filled 2015-10-06: qty 2

## 2015-10-06 MED ORDER — SODIUM CHLORIDE 0.9 % IV SOLN
INTRAVENOUS | Status: DC | PRN
  Administered 2015-10-06: 10:00:00 via INTRAVENOUS

## 2015-10-06 NOTE — Anesthesia Postprocedure Evaluation (Signed)
Anesthesia Post Note  Patient: Matthew Patrick  Procedure(s) Performed: * No procedures listed *  Patient location during evaluation: PACU Anesthesia Type: General Level of consciousness: awake and alert Pain management: pain level controlled Vital Signs Assessment: post-procedure vital signs reviewed and stable Respiratory status: spontaneous breathing, nonlabored ventilation, respiratory function stable and patient connected to nasal cannula oxygen Cardiovascular status: blood pressure returned to baseline and stable Postop Assessment: no signs of nausea or vomiting Anesthetic complications: no    Last Vitals:  Filed Vitals:   10/06/15 1110 10/06/15 1122  BP: 164/92 146/91  Pulse: 86 88  Temp:    Resp: 16 16    Last Pain: There were no vitals filed for this visit.               Martha Clan

## 2015-10-06 NOTE — Procedures (Signed)
ECT SERVICES Physician's Interval Evaluation & Treatment Note  Patient Identification: Kaylor Bort MRN:  [id number] Date of Evaluation:  10/06/2015 TX #: 6  MADRS:   MMSE:   P.E. Findings:  Patient has no new physical complaints. Vital signs stable.  Psychiatric Interval Note:  Mood is actually feeling better and less anxious although there is some notable confusion  Subjective:  Patient is a 70 y.o. male seen for evaluation for Electroconvulsive Therapy. Mood is feeling better  Treatment Summary:   []   Right Unilateral             [x]  Bilateral   % Energy : 1.0 ms 70%   Impedance: 860 ohms  Seizure Energy Index: 5869 V squared  Postictal Suppression Index: 67%  Seizure Concordance Index: 96%  Medications  Pre Shock: Zofran 4 mg Brevital 70 mg succinylcholine 80 mg  Post Shock: Versed 2 mg  Seizure Duration: 46 seconds by EMG, 65 seconds by EEG   Comments: Follow-up on Friday continue close observation of mood as well as cognitive symptoms   Lungs:  [x]   Clear to auscultation               []  Other:   Heart:    [x]   Regular rhythm             []  irregular rhythm    [x]   Previous H&P reviewed, patient examined and there are NO CHANGES                 []   Previous H&P reviewed, patient examined and there are changes noted.   Alethia Berthold, MD 6/14/201710:23 AM

## 2015-10-06 NOTE — Anesthesia Procedure Notes (Signed)
Date/Time: 10/06/2015 10:30 AM Performed by: Dionne Bucy Pre-anesthesia Checklist: Patient identified, Emergency Drugs available, Suction available and Patient being monitored Patient Re-evaluated:Patient Re-evaluated prior to inductionOxygen Delivery Method: Circle system utilized Preoxygenation: Pre-oxygenation with 100% oxygen Intubation Type: IV induction Ventilation: Mask ventilation without difficulty and Mask ventilation throughout procedure Airway Equipment and Method: Bite block Placement Confirmation: positive ETCO2 Dental Injury: Teeth and Oropharynx as per pre-operative assessment

## 2015-10-06 NOTE — Anesthesia Preprocedure Evaluation (Signed)
Anesthesia Evaluation  Patient identified by MRN, date of birth, ID band Patient awake    Reviewed: Allergy & Precautions, H&P , NPO status , Patient's Chart, lab work & pertinent test results, reviewed documented beta blocker date and time   History of Anesthesia Complications Negative for: history of anesthetic complications  Airway Mallampati: II  TM Distance: <3 FB Neck ROM: full    Dental  (+) Edentulous Upper, Edentulous Lower   Pulmonary shortness of breath and with exertion, asthma , sleep apnea and Continuous Positive Airway Pressure Ventilation , COPD,  COPD inhaler, former smoker,    Pulmonary exam normal        Cardiovascular Exercise Tolerance: Good hypertension, (-) Past MI Normal cardiovascular exam Rhythm:regular Rate:Normal     Neuro/Psych PSYCHIATRIC DISORDERS sciatica  Neuromuscular disease negative neurological ROS     GI/Hepatic negative GI ROS, Neg liver ROS,   Endo/Other  negative endocrine ROS  Renal/GU negative Renal ROS  negative genitourinary   Musculoskeletal negative musculoskeletal ROS (+)   Abdominal   Peds negative pediatric ROS (+)  Hematology negative hematology ROS (+) anemia ,   Anesthesia Other Findings Past Medical History:   Hypertension                                                 Hyperlipemia                                                 Anxiety                                                      Asthma                                                       Depression                                                 Past Surgical History:   Neck skin tag biopsy                                          2D Echocardiogram                                03/27/11     TONSILLECTOMY AND ADENOIDECTOMY                               CATARACT EXTRACTION  BMI    Body Mass Index   22.38 kg/m 2     Reproductive/Obstetrics                              Anesthesia Physical  Anesthesia Plan  ASA: III  Anesthesia Plan: General   Post-op Pain Management:    Induction:   Airway Management Planned:   Additional Equipment:   Intra-op Plan:   Post-operative Plan:   Informed Consent: I have reviewed the patients History and Physical, chart, labs and discussed the procedure including the risks, benefits and alternatives for the proposed anesthesia with the patient or authorized representative who has indicated his/her understanding and acceptance.   Dental Advisory Given  Plan Discussed with: CRNA  Anesthesia Plan Comments:         Anesthesia Quick Evaluation

## 2015-10-06 NOTE — Discharge Instructions (Signed)
1)  The drugs that you have been given will stay in your system until tomorrow so for the       next 24 hours you should not:  A. Drive an automobile  B. Make any legal decisions  C. Drink any alcoholic beverages  2)  You may resume your regular meals upon return home.  3)  A responsible adult must take you home.  Someone should stay with you for a few          hours, then be available by phone for the remainder of the treatment day.  4)  You May experience any of the following symptoms:  Headache, Nausea and a dry mouth (due to the medications you were given),  temporary memory loss and some confusion, or sore muscles (a warm bath  should help this).  If you you experience any of these symptoms let us know on                your return visit.  5)  Report any of the following: any acute discomfort, severe headache, or temperature        greater than 100.5 F.   Also report any unusual redness, swelling, drainage, or pain         at your IV site.    You may report Symptoms to:  [place] [facility]          Phone: [phone], ECT Department           or Dr. Prescott Gum office [phone]  6)  Your next ECT Treatment is Friday June 16 at 8:15  We will call 2 days prior to your scheduled appointment for arrival times.  7)  Nothing to eat or drink after midnight the night before your procedure.  8)  Take      With a sip of water the morning of your procedure.  9)  Other Instructions: Call [phone] to cancel the morning of your procedure due         to illness or emergency.  10) We will call within 72 hours to assess how you are feeling.

## 2015-10-06 NOTE — H&P (Signed)
Matthew Patrick is an 70 y.o. male.   Chief Complaint: Patient and wife are both reporting he's had some confusion, word finding difficulty. At the same time mood is better HPI: Major depression receiving ECT recent switch to bilateral treatment  Past Medical History  Diagnosis Date  . Hypertension   . Hyperlipemia   . Anxiety   . Asthma   . Depression   . Sleep apnea     has c-pap     Past Surgical History  Procedure Laterality Date  . Neck skin tag biopsy    . 2d echocardiogram  03/27/11  . Tonsillectomy and adenoidectomy    . Cataract extraction      Family History  Problem Relation Age of Onset  . Emphysema Mother   . Allergies Mother   . Heart disease Father    Social History:  reports that he quit smoking about 48 years ago. His smoking use included Cigarettes. He has a 5 pack-year smoking history. He does not have any smokeless tobacco history on file. He reports that he drinks about 4.2 oz of alcohol per week. He reports that he does not use illicit drugs.  Allergies: No Known Allergies   (Not in a hospital admission)  No results found for this or any previous visit (from the past 48 hour(s)). No results found.  Review of Systems  Constitutional: Negative.   HENT: Negative.   Eyes: Negative.   Respiratory: Negative.   Cardiovascular: Negative.   Gastrointestinal: Negative.   Musculoskeletal: Negative.   Skin: Negative.   Neurological: Negative.   Psychiatric/Behavioral: Positive for memory loss. Negative for depression, suicidal ideas, hallucinations and substance abuse. The patient is not nervous/anxious and does not have insomnia.     Blood pressure 148/85, pulse 77, temperature 98.4 F (36.9 C), resp. rate 18, height 5\' 10"  (1.778 m), weight 71.215 kg (157 lb), SpO2 97 %. Physical Exam  Nursing note and vitals reviewed. Constitutional: He appears well-developed and well-nourished.  HENT:  Head: Normocephalic and atraumatic.  Eyes: Conjunctivae are  normal. Pupils are equal, round, and reactive to light.  Neck: Normal range of motion.  Cardiovascular: Normal rate and normal heart sounds.   Respiratory: Effort normal. No respiratory distress.  GI: Soft.  Musculoskeletal: Normal range of motion.  Neurological: He is alert.  Skin: Skin is warm and dry.  Psychiatric: He has a normal mood and affect. His behavior is normal. Judgment normal.     Assessment/Plan Continue treatment today and Friday with continued reassessment of mood as well as cognitive side effects  Alethia Berthold, MD 10/06/2015, 10:22 AM

## 2015-10-06 NOTE — Transfer of Care (Signed)
Immediate Anesthesia Transfer of Care Note  Patient: Matthew Patrick  Procedure(s) Performed: ECT  Patient Location: PACU  Anesthesia Type:General  Level of Consciousness: awake and patient cooperative  Airway & Oxygen Therapy: Patient Spontanous Breathing and Patient connected to face mask oxygen  Post-op Assessment: Report given to RN  Post vital signs: Reviewed and stable  Last Vitals:  Filed Vitals:   10/06/15 0841 10/06/15 1041  BP: 148/85 153/85  Pulse: 77 79  Temp: 36.9 C 37.3 C  Resp: 18 13    Last Pain: There were no vitals filed for this visit.       Complications: No apparent anesthesia complications

## 2015-10-06 NOTE — Anesthesia Postprocedure Evaluation (Signed)
Anesthesia Post Note  Patient: Matthew Patrick  Procedure(s) Performed: * No procedures listed *  Patient location during evaluation: PACU Anesthesia Type: General Level of consciousness: awake and alert and oriented Pain management: pain level controlled Vital Signs Assessment: post-procedure vital signs reviewed and stable Respiratory status: spontaneous breathing Cardiovascular status: blood pressure returned to baseline Anesthetic complications: no    Last Vitals:  Filed Vitals:   10/04/15 1044 10/04/15 1057  BP: 142/73 138/74  Pulse: 78 78  Temp: 37 C   Resp: 19 18    Last Pain: There were no vitals filed for this visit.               Timmie Dugue

## 2015-10-08 ENCOUNTER — Encounter: Payer: Self-pay | Admitting: Anesthesiology

## 2015-10-08 ENCOUNTER — Other Ambulatory Visit: Payer: Self-pay | Admitting: Psychiatry

## 2015-10-08 ENCOUNTER — Encounter (HOSPITAL_BASED_OUTPATIENT_CLINIC_OR_DEPARTMENT_OTHER)
Admission: RE | Admit: 2015-10-08 | Discharge: 2015-10-08 | Disposition: A | Discharge status: Home / Self Care | Payer: Medicare Other | Source: Ambulatory Visit | Attending: Psychiatry | Admitting: Psychiatry

## 2015-10-08 DIAGNOSIS — Z87891 Personal history of nicotine dependence: Secondary | ICD-10-CM | POA: Diagnosis not present

## 2015-10-08 DIAGNOSIS — F332 Major depressive disorder, recurrent severe without psychotic features: Secondary | ICD-10-CM

## 2015-10-08 DIAGNOSIS — F418 Other specified anxiety disorders: Secondary | ICD-10-CM | POA: Diagnosis not present

## 2015-10-08 DIAGNOSIS — F419 Anxiety disorder, unspecified: Secondary | ICD-10-CM | POA: Diagnosis not present

## 2015-10-08 DIAGNOSIS — E785 Hyperlipidemia, unspecified: Secondary | ICD-10-CM | POA: Diagnosis not present

## 2015-10-08 DIAGNOSIS — I10 Essential (primary) hypertension: Secondary | ICD-10-CM | POA: Diagnosis not present

## 2015-10-08 DIAGNOSIS — J45909 Unspecified asthma, uncomplicated: Secondary | ICD-10-CM | POA: Diagnosis not present

## 2015-10-08 MED ORDER — KETOROLAC TROMETHAMINE 30 MG/ML IJ SOLN: 30.0000 mg | Freq: Once | INTRAMUSCULAR | Status: DC

## 2015-10-08 MED ORDER — ONDANSETRON HCL 4 MG/2ML IJ SOLN: 4.0000 mg | Freq: Once | INTRAMUSCULAR | Status: DC | PRN

## 2015-10-08 MED ORDER — SUCCINYLCHOLINE CHLORIDE 20 MG/ML IJ SOLN
INTRAMUSCULAR | Status: DC | PRN
  Administered 2015-10-08: 80 mg via INTRAVENOUS

## 2015-10-08 MED ORDER — KETOROLAC TROMETHAMINE 30 MG/ML IJ SOLN
INTRAMUSCULAR | Status: AC
  Administered 2015-10-08: 30 mg
  Filled 2015-10-08: qty 1

## 2015-10-08 MED ORDER — FENTANYL CITRATE (PF) 100 MCG/2ML IJ SOLN: 25.0000 ug | INTRAMUSCULAR | Status: DC | PRN

## 2015-10-08 MED ORDER — METHOHEXITAL SODIUM 100 MG/10ML IV SOSY
PREFILLED_SYRINGE | INTRAVENOUS | Status: DC | PRN
  Administered 2015-10-08: 70 mg via INTRAVENOUS

## 2015-10-08 MED ORDER — SODIUM CHLORIDE 0.9 % IV SOLN
250.0000 mL | Freq: Once | INTRAVENOUS | Status: AC
  Administered 2015-10-08: 500 mL via INTRAVENOUS

## 2015-10-08 MED ORDER — ONDANSETRON HCL 4 MG/2ML IJ SOLN
INTRAMUSCULAR | Status: AC
  Filled 2015-10-08: qty 2

## 2015-10-08 MED ORDER — ONDANSETRON HCL 4 MG/2ML IJ SOLN
4.0000 mg | Freq: Once | INTRAMUSCULAR | Status: AC
  Administered 2015-10-08: 4 mg via INTRAVENOUS

## 2015-10-08 MED ORDER — MIDAZOLAM HCL 2 MG/2ML IJ SOLN: 2.0000 mg | Freq: Once | INTRAMUSCULAR | Status: DC

## 2015-10-08 MED ORDER — SODIUM CHLORIDE 0.9 % IV SOLN
INTRAVENOUS | Status: DC | PRN
Start: 2015-10-08 — End: 2015-10-08
  Administered 2015-10-08: 11:00:00 via INTRAVENOUS

## 2015-10-08 MED ORDER — ESMOLOL HCL 100 MG/10ML IV SOLN
INTRAVENOUS | Status: DC | PRN
Start: 2015-10-08 — End: 2015-10-08
  Administered 2015-10-08: 30 mg via INTRAVENOUS

## 2015-10-08 NOTE — Anesthesia Postprocedure Evaluation (Signed)
Anesthesia Post Note  Patient: Matthew Patrick  Procedure(s) Performed: * No procedures listed *  Patient location during evaluation: PACU Anesthesia Type: General Level of consciousness: awake and alert and oriented Pain management: pain level controlled Vital Signs Assessment: post-procedure vital signs reviewed and stable Respiratory status: spontaneous breathing Cardiovascular status: blood pressure returned to baseline Anesthetic complications: no    Last Vitals:  Filed Vitals:   10/08/15 1133 10/08/15 1135  BP: 139/74   Pulse:  81  Temp:    Resp: 18     Last Pain: There were no vitals filed for this visit.               Jontavia Leatherbury

## 2015-10-08 NOTE — Transfer of Care (Signed)
Immediate Anesthesia Transfer of Care Note  Patient: Matthew Patrick  Procedure(s) Performed: * No procedures listed *  Patient Location: PACU  Anesthesia Type:General  Level of Consciousness: patient cooperative and lethargic  Airway & Oxygen Therapy: Patient Spontanous Breathing and Patient connected to face mask oxygen  Post-op Assessment: Report given to RN and Post -op Vital signs reviewed and stable  Post vital signs: Reviewed and stable  Last Vitals:  Filed Vitals:   10/08/15 0848 10/08/15 1054  BP: 120/77 141/66  Pulse: 68 81  Temp:  37.1 C  Resp: 16 14    Last Pain: There were no vitals filed for this visit.       Complications: No apparent anesthesia complications

## 2015-10-08 NOTE — Discharge Instructions (Signed)
1)  The drugs that you have been given will stay in your system until tomorrow so for the       next 24 hours you should not:  A. Drive an automobile  B. Make any legal decisions  C. Drink any alcoholic beverages  2)  You may resume your regular meals upon return home.  3)  A responsible adult must take you home.  Someone should stay with you for a few          hours, then be available by phone for the remainder of the treatment day.  4)  You May experience any of the following symptoms:  Headache, Nausea and a dry mouth (due to the medications you were given),  temporary memory loss and some confusion, or sore muscles (a warm bath  should help this).  If you you experience any of these symptoms let us know on                your return visit.  5)  Report any of the following: any acute discomfort, severe headache, or temperature        greater than 100.5 F.   Also report any unusual redness, swelling, drainage, or pain         at your IV site.    You may report Symptoms to:  [place] [facility]          Phone: [phone], ECT Department           or Dr. Prescott Gum office [phone]  6)  Your next ECT Treatment is Day Monday  Date June 19  We will call 2 days prior to your scheduled appointment for arrival times.  7)  Nothing to eat or drink after midnight the night before your procedure.  8)  Take .     With a sip of water the morning of your procedure.  9)  Other Instructions: Call [phone] to cancel the morning of your procedure due         to illness or emergency.  10) We will call within 72 hours to assess how you are feeling.

## 2015-10-08 NOTE — H&P (Signed)
Matthew Patrick is an 70 y.o. male.   Chief Complaint: Minor memory complaints. Actually better than last time. Mood stable. Minimal depression and anxiety. HPI: History of severe major depression with teen E[place] to respond to ECT at this time.  Past Medical History  Diagnosis Date  . Hypertension   . Hyperlipemia   . Anxiety   . Asthma   . Depression   . Sleep apnea     has c-pap     Past Surgical History  Procedure Laterality Date  . Neck skin tag biopsy    . 2d echocardiogram  03/27/11  . Tonsillectomy and adenoidectomy    . Cataract extraction      Family History  Problem Relation Age of Onset  . Emphysema Mother   . Allergies Mother   . Heart disease Father    Social History:  reports that he quit smoking about 48 years ago. His smoking use included Cigarettes. He has a 5 pack-year smoking history. He does not have any smokeless tobacco history on file. He reports that he drinks about 4.2 oz of alcohol per week. He reports that he does not use illicit drugs.  Allergies: No Known Allergies   (Not in a hospital admission)  No results found for this or any previous visit (from the past 48 hour(s)). No results found.  Review of Systems  Constitutional: Negative.   HENT: Negative.   Eyes: Negative.   Respiratory: Negative.   Cardiovascular: Negative.   Gastrointestinal: Negative.   Musculoskeletal: Negative.   Skin: Negative.   Neurological: Negative.   Psychiatric/Behavioral: Positive for memory loss. Negative for depression, suicidal ideas, hallucinations and substance abuse. The patient is not nervous/anxious and does not have insomnia.     Blood pressure 120/77, pulse 68, resp. rate 16, height 5\' 10"  (1.778 m), weight 71.668 kg (158 lb), SpO2 99 %. Physical Exam  Nursing note and vitals reviewed. Constitutional: He appears well-developed and well-nourished.  HENT:  Head: Normocephalic and atraumatic.  Eyes: Conjunctivae are normal. Pupils are equal, round, and  reactive to light.  Neck: Normal range of motion.  Cardiovascular: Normal rate and normal heart sounds.   Respiratory: Effort normal. No respiratory distress.  GI: Soft.  Musculoskeletal: Normal range of motion.  Neurological: He is alert.  Skin: Skin is warm and dry.  Psychiatric: He has a normal mood and affect. His behavior is normal. Judgment and thought content normal.     Assessment/Plan Treatment today follow-up Monday continue evaluation for maximum benefit.  Alethia Berthold, MD 10/08/2015, 10:33 AM

## 2015-10-08 NOTE — Anesthesia Preprocedure Evaluation (Signed)
Anesthesia Evaluation  Patient identified by MRN, date of birth, ID band Patient awake    Reviewed: Allergy & Precautions, H&P , NPO status , Patient's Chart, lab work & pertinent test results, reviewed documented beta blocker date and time   History of Anesthesia Complications Negative for: history of anesthetic complications  Airway Mallampati: II  TM Distance: <3 FB Neck ROM: full    Dental  (+) Edentulous Upper, Edentulous Lower   Pulmonary shortness of breath and with exertion, asthma , sleep apnea and Continuous Positive Airway Pressure Ventilation , COPD,  COPD inhaler, former smoker,    Pulmonary exam normal        Cardiovascular Exercise Tolerance: Good hypertension, (-) Past MI Normal cardiovascular exam Rhythm:regular Rate:Normal     Neuro/Psych PSYCHIATRIC DISORDERS Anxiety Depression sciatica  Neuromuscular disease negative neurological ROS     GI/Hepatic negative GI ROS, Neg liver ROS,   Endo/Other  negative endocrine ROS  Renal/GU negative Renal ROS  negative genitourinary   Musculoskeletal negative musculoskeletal ROS (+)   Abdominal   Peds negative pediatric ROS (+)  Hematology negative hematology ROS (+) anemia ,   Anesthesia Other Findings Past Medical History:   Hypertension                                                 Hyperlipemia                                                 Anxiety                                                      Asthma                                                       Depression                                                 Past Surgical History:   Neck skin tag biopsy                                          2D Echocardiogram                                03/27/11     TONSILLECTOMY AND ADENOIDECTOMY                               CATARACT EXTRACTION  BMI    Body Mass Index   22.38 kg/m 2     Reproductive/Obstetrics                             Anesthesia Physical  Anesthesia Plan  ASA: III  Anesthesia Plan: General   Post-op Pain Management:    Induction:   Airway Management Planned:   Additional Equipment:   Intra-op Plan:   Post-operative Plan:   Informed Consent: I have reviewed the patients History and Physical, chart, labs and discussed the procedure including the risks, benefits and alternatives for the proposed anesthesia with the patient or authorized representative who has indicated his/her understanding and acceptance.   Dental Advisory Given  Plan Discussed with: CRNA  Anesthesia Plan Comments:         Anesthesia Quick Evaluation

## 2015-10-08 NOTE — Procedures (Signed)
ECT SERVICES Physician's Interval Evaluation & Treatment Note  Patient Identification: Matthew Patrick MRN:  [id number] Date of Evaluation:  10/08/2015 TX #: 7  MADRS: 23  MMSE: 30  P.E. Findings:  Blood pressure stable. Lungs clear heart normal  Psychiatric Interval Note:  Mood improving.  Subjective:  Patient is a 70 y.o. male seen for evaluation for Electroconvulsive Therapy. No specific complaint. He did have a headache after last treatment that appeared to be worse than usual.  Treatment Summary:   []   Right Unilateral             [x]  Bilateral   % Energy : 1.0 ms 70%   Impedance: 980 ohms  Seizure Energy Index: 4944 V squared  Postictal Suppression Index: 85%  Seizure Concordance Index: 93%  Medications  Pre Shock: Zofran 4 mg, Toradol 30 mg, Brevital 70 mg, succinylcholine 80 mg  Post Shock: Esmolol 10 mg, Versed 2 mg  Seizure Duration: 38 seconds by EMG, 51 seconds by EEG   Comments: Follow-up in 3 days on Monday   Lungs:  [x]   Clear to auscultation               []  Other:   Heart:    [x]   Regular rhythm             []  irregular rhythm    [x]   Previous H&P reviewed, patient examined and there are NO CHANGES                 []   Previous H&P reviewed, patient examined and there are changes noted.   Alethia Berthold, MD 6/16/201710:35 AM

## 2015-10-11 ENCOUNTER — Encounter: Payer: Self-pay | Admitting: Anesthesiology

## 2015-10-11 ENCOUNTER — Other Ambulatory Visit: Payer: Self-pay | Admitting: Psychiatry

## 2015-10-11 ENCOUNTER — Encounter (HOSPITAL_BASED_OUTPATIENT_CLINIC_OR_DEPARTMENT_OTHER)
Admission: RE | Admit: 2015-10-11 | Discharge: 2015-10-11 | Disposition: A | Discharge status: Home / Self Care | Payer: Medicare Other | Source: Ambulatory Visit | Attending: Psychiatry | Admitting: Psychiatry

## 2015-10-11 DIAGNOSIS — Z87891 Personal history of nicotine dependence: Secondary | ICD-10-CM | POA: Diagnosis not present

## 2015-10-11 DIAGNOSIS — F418 Other specified anxiety disorders: Secondary | ICD-10-CM | POA: Diagnosis not present

## 2015-10-11 DIAGNOSIS — J45909 Unspecified asthma, uncomplicated: Secondary | ICD-10-CM | POA: Diagnosis not present

## 2015-10-11 DIAGNOSIS — F332 Major depressive disorder, recurrent severe without psychotic features: Secondary | ICD-10-CM

## 2015-10-11 DIAGNOSIS — F419 Anxiety disorder, unspecified: Secondary | ICD-10-CM | POA: Diagnosis not present

## 2015-10-11 DIAGNOSIS — E785 Hyperlipidemia, unspecified: Secondary | ICD-10-CM | POA: Diagnosis not present

## 2015-10-11 DIAGNOSIS — I10 Essential (primary) hypertension: Secondary | ICD-10-CM | POA: Diagnosis not present

## 2015-10-11 MED ORDER — SODIUM CHLORIDE 0.9 % IV SOLN
250.0000 mL | Freq: Once | INTRAVENOUS | Status: AC
  Administered 2015-10-11: 500 mL via INTRAVENOUS

## 2015-10-11 MED ORDER — METHOHEXITAL SODIUM 100 MG/10ML IV SOSY
PREFILLED_SYRINGE | INTRAVENOUS | Status: DC | PRN
  Administered 2015-10-11: 70 mg via INTRAVENOUS

## 2015-10-11 MED ORDER — SODIUM CHLORIDE 0.9 % IV SOLN
INTRAVENOUS | Status: DC | PRN
  Administered 2015-10-11: 11:00:00 via INTRAVENOUS

## 2015-10-11 MED ORDER — SUCCINYLCHOLINE CHLORIDE 200 MG/10ML IV SOSY
PREFILLED_SYRINGE | INTRAVENOUS | Status: DC | PRN
  Administered 2015-10-11: 80 mg via INTRAVENOUS

## 2015-10-11 MED ORDER — KETOROLAC TROMETHAMINE 30 MG/ML IJ SOLN
INTRAMUSCULAR | Status: AC
  Filled 2015-10-11: qty 1

## 2015-10-11 MED ORDER — KETOROLAC TROMETHAMINE 30 MG/ML IJ SOLN
30.0000 mg | Freq: Once | INTRAMUSCULAR | Status: AC
  Administered 2015-10-11: 30 mg via INTRAVENOUS

## 2015-10-11 NOTE — Transfer of Care (Signed)
Immediate Anesthesia Transfer of Care Note  Patient: Matthew Patrick  Procedure(s) Performed: ECT  Patient Location: PACU  Anesthesia Type:General  Level of Consciousness: sedated  Airway & Oxygen Therapy: Patient Spontanous Breathing and Patient connected to face mask oxygen  Post-op Assessment: Report given to RN  Post vital signs: Reviewed and stable  Last Vitals:  Filed Vitals:   10/11/15 0910 10/11/15 1100  BP:  170/95  Pulse:  85  Temp: 36.7 C 37.4 C  Resp:  11    Last Pain: There were no vitals filed for this visit.       Complications: No apparent anesthesia complications

## 2015-10-11 NOTE — Anesthesia Preprocedure Evaluation (Addendum)
Anesthesia Evaluation  Patient identified by MRN, date of birth, ID band Patient awake    Reviewed: Allergy & Precautions, H&P , NPO status , Patient's Chart, lab work & pertinent test results, reviewed documented beta blocker date and time   History of Anesthesia Complications Negative for: history of anesthetic complications  Airway Mallampati: II  TM Distance: <3 FB Neck ROM: full    Dental  (+) Edentulous Upper, Edentulous Lower   Pulmonary shortness of breath and with exertion, asthma , sleep apnea and Continuous Positive Airway Pressure Ventilation , COPD,  COPD inhaler, former smoker,    Pulmonary exam normal        Cardiovascular Exercise Tolerance: Good hypertension, (-) Past MI Normal cardiovascular exam Rhythm:regular Rate:Normal     Neuro/Psych PSYCHIATRIC DISORDERS sciatica  Neuromuscular disease negative neurological ROS     GI/Hepatic negative GI ROS, Neg liver ROS,   Endo/Other  negative endocrine ROS  Renal/GU negative Renal ROS  negative genitourinary   Musculoskeletal negative musculoskeletal ROS (+)   Abdominal   Peds negative pediatric ROS (+)  Hematology negative hematology ROS (+) anemia ,   Anesthesia Other Findings Past Medical History:   Hypertension                                                 Hyperlipemia                                                 Anxiety                                                      Asthma                                                       Depression                                                 Past Surgical History:   Neck skin tag biopsy                                          2D Echocardiogram                                03/27/11     TONSILLECTOMY AND ADENOIDECTOMY                               CATARACT EXTRACTION  BMI    Body Mass Index   22.38 kg/m 2     Reproductive/Obstetrics                             Anesthesia Physical  Anesthesia Plan  ASA: III  Anesthesia Plan: General   Post-op Pain Management:    Induction:   Airway Management Planned:   Additional Equipment:   Intra-op Plan:   Post-operative Plan:   Informed Consent: I have reviewed the patients History and Physical, chart, labs and discussed the procedure including the risks, benefits and alternatives for the proposed anesthesia with the patient or authorized representative who has indicated his/her understanding and acceptance.   Dental Advisory Given  Plan Discussed with: CRNA  Anesthesia Plan Comments:         Anesthesia Quick Evaluation

## 2015-10-11 NOTE — Anesthesia Postprocedure Evaluation (Signed)
Anesthesia Post Note  Patient: Matthew Patrick  Procedure(s) Performed: * No procedures listed *  Patient location during evaluation: PACU Anesthesia Type: General Level of consciousness: awake and alert Pain management: pain level controlled Vital Signs Assessment: post-procedure vital signs reviewed and stable Respiratory status: spontaneous breathing, nonlabored ventilation, respiratory function stable and patient connected to nasal cannula oxygen Cardiovascular status: blood pressure returned to baseline and stable Postop Assessment: no signs of nausea or vomiting Anesthetic complications: no    Last Vitals:  Filed Vitals:   10/11/15 1144 10/11/15 1151  BP:  162/80  Pulse: 94   Temp:    Resp: 18     Last Pain: There were no vitals filed for this visit.               Martha Clan

## 2015-10-11 NOTE — H&P (Signed)
Matthew Patrick is an 70 y.o. male.   Chief Complaint: A little more anxiety over the weekend. Slightly more excessive. Not necessarily more depressed. HPI: Recurrent severe depression responding to ECT.  Past Medical History  Diagnosis Date  . Hypertension   . Hyperlipemia   . Anxiety   . Asthma   . Depression   . Sleep apnea     has c-pap     Past Surgical History  Procedure Laterality Date  . Neck skin tag biopsy    . 2d echocardiogram  03/27/11  . Tonsillectomy and adenoidectomy    . Cataract extraction      Family History  Problem Relation Age of Onset  . Emphysema Mother   . Allergies Mother   . Heart disease Father    Social History:  reports that he quit smoking about 48 years ago. His smoking use included Cigarettes. He has a 5 pack-year smoking history. He does not have any smokeless tobacco history on file. He reports that he drinks about 4.2 oz of alcohol per week. He reports that he does not use illicit drugs.  Allergies: No Known Allergies   (Not in a hospital admission)  No results found for this or any previous visit (from the past 48 hour(s)). No results found.  Review of Systems  Constitutional: Negative.   HENT: Negative.   Eyes: Negative.   Respiratory: Negative.   Cardiovascular: Negative.   Gastrointestinal: Negative.   Musculoskeletal: Negative.   Skin: Negative.   Neurological: Negative.   Psychiatric/Behavioral: Positive for memory loss. Negative for depression, suicidal ideas, hallucinations and substance abuse. The patient is nervous/anxious. The patient does not have insomnia.     Blood pressure 149/78, pulse 64, temperature 98.1 F (36.7 C), temperature source Oral, resp. rate 18, height 5\' 10"  (1.778 m), weight 71.215 kg (157 lb), SpO2 98 %. Physical Exam  Nursing note and vitals reviewed. Constitutional: He appears well-developed and well-nourished.  HENT:  Head: Normocephalic and atraumatic.  Eyes: Conjunctivae are normal. Pupils  are equal, round, and reactive to light.  Neck: Normal range of motion.  Cardiovascular: Normal rate and normal heart sounds.   Respiratory: Effort normal. No respiratory distress.  GI: Soft.  Musculoskeletal: Normal range of motion.  Neurological: He is alert.  Skin: Skin is warm and dry.  Psychiatric: His speech is normal and behavior is normal. Judgment and thought content normal. His mood appears anxious. He exhibits abnormal recent memory.     Assessment/Plan Recommend treatment today and follow-up Wednesday after which we will reassess the possibility of ending the series and moving to maintenance phase depending on the amount of improvement.  Alethia Berthold, MD 10/11/2015, 10:39 AM

## 2015-10-11 NOTE — Discharge Instructions (Addendum)
1)  The drugs that you have been given will stay in your system until tomorrow so for the       next 24 hours you should not:  A. Drive an automobile  B. Make any legal decisions  C. Drink any alcoholic beverages  2)  You may resume your regular meals upon return home.  3)  A responsible adult must take you home.  Someone should stay with you for a few          hours, then be available by phone for the remainder of the treatment day.  4)  You May experience any of the following symptoms:  Headache, Nausea and a dry mouth (due to the medications you were given),  temporary memory loss and some confusion, or sore muscles (a warm bath  should help this).  If you you experience any of these symptoms let us know on                your return visit.  5)  Report any of the following: any acute discomfort, severe headache, or temperature        greater than 100.5 F.   Also report any unusual redness, swelling, drainage, or pain         at your IV site.    You may report Symptoms to:  [place] at [facility]          Phone: [phone], ECT Department           or Dr. Prescott Gum office [phone]  6)  Your next ECT Treatment is Wednesday June 21  We will call 2 days prior to your scheduled appointment for arrival times.  7)  Nothing to eat or drink after midnight the night before your procedure.  8)  Take     With a sip of water the morning of your procedure.  9)  Other Instructions: Call [phone] to cancel the morning of your procedure due         to illness or emergency.  10) We will call within 72 hours to assess how you are feeling.

## 2015-10-11 NOTE — Anesthesia Procedure Notes (Signed)
Date/Time: 10/11/2015 10:51 AM Performed by: Dionne Bucy Pre-anesthesia Checklist: Patient identified, Emergency Drugs available, Suction available and Patient being monitored Patient Re-evaluated:Patient Re-evaluated prior to inductionOxygen Delivery Method: Circle system utilized Preoxygenation: Pre-oxygenation with 100% oxygen Intubation Type: IV induction Ventilation: Mask ventilation without difficulty and Mask ventilation throughout procedure Airway Equipment and Method: Bite block Placement Confirmation: positive ETCO2 Dental Injury: Teeth and Oropharynx as per pre-operative assessment

## 2015-10-11 NOTE — Procedures (Signed)
ECT SERVICES Physician's Interval Evaluation & Treatment Note  Patient Identification: Matthew Patrick MRN:  [id number] Date of Evaluation:  10/11/2015 TX #: 8  MADRS:   MMSE:   P.E. Findings:  No change to physical exam vitals unremarkable lungs and heart normal.  Psychiatric Interval Note:  Subjectively more anxious. Minor memory complaints  Subjective:  Patient is a 70 y.o. male seen for evaluation for Electroconvulsive Therapy. A little bit more anxious not severely so not depressed.  Treatment Summary:   []   Right Unilateral             [x]  Bilateral   % Energy : 1.0 ms 70%   Impedance: 880 ohms  Seizure Energy Index: 7185 V squared  Postictal Suppression Index: 90%  Seizure Concordance Index: 98%  Medications  Pre Shock: Zofran 4 mg, Toradol 30 mg, Brevital 70 mg, succinylcholine 80 mg  Post Shock:    Seizure Duration: 45 seconds by EMG 64 seconds by EEG   Comments: Follow-up Wednesday   Lungs:  [x]   Clear to auscultation               []  Other:   Heart:    [x]   Regular rhythm             []  irregular rhythm    [x]   Previous H&P reviewed, patient examined and there are NO CHANGES                 []   Previous H&P reviewed, patient examined and there are changes noted.   Alethia Berthold, MD 6/19/201710:40 AM

## 2015-10-12 ENCOUNTER — Other Ambulatory Visit: Payer: Self-pay | Admitting: Psychiatry

## 2015-10-12 DIAGNOSIS — H5203 Hypermetropia, bilateral: Secondary | ICD-10-CM | POA: Diagnosis not present

## 2015-10-12 DIAGNOSIS — H52223 Regular astigmatism, bilateral: Secondary | ICD-10-CM | POA: Diagnosis not present

## 2015-10-12 DIAGNOSIS — H35373 Puckering of macula, bilateral: Secondary | ICD-10-CM | POA: Diagnosis not present

## 2015-10-12 DIAGNOSIS — Z961 Presence of intraocular lens: Secondary | ICD-10-CM | POA: Diagnosis not present

## 2015-10-12 DIAGNOSIS — H524 Presbyopia: Secondary | ICD-10-CM | POA: Diagnosis not present

## 2015-10-12 DIAGNOSIS — Z9849 Cataract extraction status, unspecified eye: Secondary | ICD-10-CM | POA: Diagnosis not present

## 2015-10-13 ENCOUNTER — Telehealth: Payer: Self-pay | Admitting: *Deleted

## 2015-10-13 ENCOUNTER — Encounter: Admission: RE | Admit: 2015-10-13 | Payer: Medicare Other | Source: Ambulatory Visit

## 2015-10-13 DIAGNOSIS — F341 Dysthymic disorder: Secondary | ICD-10-CM | POA: Diagnosis not present

## 2015-10-18 ENCOUNTER — Telehealth: Payer: Self-pay | Admitting: Pulmonary Disease

## 2015-10-18 DIAGNOSIS — G4733 Obstructive sleep apnea (adult) (pediatric): Secondary | ICD-10-CM

## 2015-10-18 NOTE — Telephone Encounter (Signed)
Order has been placed. Pt is aware. Nothing further was needed. 

## 2015-10-20 ENCOUNTER — Encounter (HOSPITAL_BASED_OUTPATIENT_CLINIC_OR_DEPARTMENT_OTHER)
Admission: RE | Admit: 2015-10-20 | Discharge: 2015-10-20 | Disposition: A | Discharge status: Home / Self Care | Payer: Medicare Other | Source: Ambulatory Visit | Attending: Psychiatry | Admitting: Psychiatry

## 2015-10-20 ENCOUNTER — Other Ambulatory Visit: Payer: Self-pay | Admitting: Psychiatry

## 2015-10-20 ENCOUNTER — Encounter: Payer: Self-pay | Admitting: Anesthesiology

## 2015-10-20 DIAGNOSIS — Z87891 Personal history of nicotine dependence: Secondary | ICD-10-CM | POA: Diagnosis not present

## 2015-10-20 DIAGNOSIS — E785 Hyperlipidemia, unspecified: Secondary | ICD-10-CM | POA: Diagnosis not present

## 2015-10-20 DIAGNOSIS — F419 Anxiety disorder, unspecified: Secondary | ICD-10-CM | POA: Diagnosis not present

## 2015-10-20 DIAGNOSIS — F332 Major depressive disorder, recurrent severe without psychotic features: Secondary | ICD-10-CM | POA: Diagnosis not present

## 2015-10-20 DIAGNOSIS — G4730 Sleep apnea, unspecified: Secondary | ICD-10-CM | POA: Diagnosis not present

## 2015-10-20 DIAGNOSIS — I10 Essential (primary) hypertension: Secondary | ICD-10-CM | POA: Diagnosis not present

## 2015-10-20 DIAGNOSIS — J45909 Unspecified asthma, uncomplicated: Secondary | ICD-10-CM | POA: Diagnosis not present

## 2015-10-20 MED ORDER — ONDANSETRON HCL 4 MG/2ML IJ SOLN
4.0000 mg | Freq: Once | INTRAMUSCULAR | Status: AC
  Administered 2015-10-20: 4 mg via INTRAVENOUS

## 2015-10-20 MED ORDER — KETOROLAC TROMETHAMINE 30 MG/ML IJ SOLN
30.0000 mg | Freq: Once | INTRAMUSCULAR | Status: AC
  Administered 2015-10-20: 30 mg via INTRAVENOUS

## 2015-10-20 MED ORDER — SUCCINYLCHOLINE CHLORIDE 20 MG/ML IJ SOLN
INTRAMUSCULAR | Status: DC | PRN
  Administered 2015-10-20: 80 mg via INTRAVENOUS

## 2015-10-20 MED ORDER — METHOHEXITAL SODIUM 100 MG/10ML IV SOSY
PREFILLED_SYRINGE | INTRAVENOUS | Status: DC | PRN
  Administered 2015-10-20: 70 mg via INTRAVENOUS

## 2015-10-20 MED ORDER — ONDANSETRON HCL 4 MG/2ML IJ SOLN
INTRAMUSCULAR | Status: AC
  Administered 2015-10-20: 4 mg via INTRAVENOUS
  Filled 2015-10-20: qty 2

## 2015-10-20 MED ORDER — MIDAZOLAM HCL 2 MG/2ML IJ SOLN: 2.0000 mg | Freq: Once | INTRAMUSCULAR | Status: DC

## 2015-10-20 MED ORDER — SODIUM CHLORIDE 0.9 % IV SOLN
250.0000 mL | Freq: Once | INTRAVENOUS | Status: AC
  Administered 2015-10-20: 500 mL via INTRAVENOUS

## 2015-10-20 MED ORDER — KETOROLAC TROMETHAMINE 30 MG/ML IJ SOLN: 30.0000 mg | Freq: Once | INTRAMUSCULAR | Status: DC

## 2015-10-20 MED ORDER — ONDANSETRON HCL 4 MG/2ML IJ SOLN: 4.0000 mg | Freq: Once | INTRAMUSCULAR | Status: DC

## 2015-10-20 MED ORDER — KETOROLAC TROMETHAMINE 30 MG/ML IJ SOLN
INTRAMUSCULAR | Status: AC
Start: 2015-10-20 — End: 2015-10-20
  Administered 2015-10-20: 30 mg via INTRAVENOUS
  Filled 2015-10-20: qty 1

## 2015-10-20 MED ORDER — SODIUM CHLORIDE 0.9 % IV SOLN
250.0000 mL | Freq: Once | INTRAVENOUS | Status: AC
  Administered 2015-10-20: 11:00:00 via INTRAVENOUS

## 2015-10-20 NOTE — Anesthesia Procedure Notes (Signed)
Date/Time: 10/20/2015 10:36 AM Performed by: Doreen Salvage Pre-anesthesia Checklist: Patient identified, Emergency Drugs available, Suction available and Patient being monitored Patient Re-evaluated:Patient Re-evaluated prior to inductionOxygen Delivery Method: Circle system utilized Preoxygenation: Pre-oxygenation with 100% oxygen Intubation Type: IV induction Ventilation: Mask ventilation without difficulty and Mask ventilation throughout procedure Airway Equipment and Method: Bite block Placement Confirmation: positive ETCO2 Dental Injury: Teeth and Oropharynx as per pre-operative assessment

## 2015-10-20 NOTE — Procedures (Signed)
ECT SERVICES Physician's Interval Evaluation & Treatment Note  Patient Identification: Matthew Patrick MRN:  [id number] Date of Evaluation:  10/20/2015 TX #: 9  MADRS:   MMSE:   P.E. Findings:  No change to physical exam  Psychiatric Interval Note:  Affect is little bit more anxious and down.  Subjective:  Patient is a 70 y.o. male seen for evaluation for Electroconvulsive Therapy. Subjectively feeling a bit more down  Treatment Summary:   []   Right Unilateral             [x]  Bilateral   % Energy : 1.0 ms 70%   Impedance: 590 ohms  Seizure Energy Index: 6486 V squared  Postictal Suppression Index: 86%  Seizure Concordance Index: 98%  Medications  Pre Shock: Zofran 4 mg, Toradol 30 mg, Brevital 70 mg, succinylcholine 80 mg  Post Shock:    Seizure Duration: 45 seconds by EMG, 61 seconds by EEG   Comments: Follow-up in 2 days. Increase Brevital dose to 80 mg and increase succinylcholine dose to 100 mg.   Lungs:  [x]   Clear to auscultation               []  Other:   Heart:    [x]   Regular rhythm             []  irregular rhythm    [x]   Previous H&P reviewed, patient examined and there are NO CHANGES                 []   Previous H&P reviewed, patient examined and there are changes noted.   Alethia Berthold, MD 6/28/201710:34 AM

## 2015-10-20 NOTE — Discharge Instructions (Signed)
1)  The drugs that you have been given will stay in your system until tomorrow so for the       next 24 hours you should not:  A. Drive an automobile  B. Make any legal decisions  C. Drink any alcoholic beverages  2)  You may resume your regular meals upon return home.  3)  A responsible adult must take you home.  Someone should stay with you for a few          hours, then be available by phone for the remainder of the treatment day.  4)  You May experience any of the following symptoms:  Headache, Nausea and a dry mouth (due to the medications you were given),  temporary memory loss and some confusion, or sore muscles (a warm bath  should help this).  If you you experience any of these symptoms let us know on                your return visit.  5)  Report any of the following: any acute discomfort, severe headache, or temperature        greater than 100.5 F.   Also report any unusual redness, swelling, drainage, or pain         at your IV site.    You may report Symptoms to:  [place] at [facility]          Phone: [phone], ECT Department           or Dr. Prescott Gum office [phone]  6)  Your next ECT Treatment is Friday June 30  We will call 2 days prior to your scheduled appointment for arrival times.  7)  Nothing to eat or drink after midnight the night before your procedure.  8)  Take nothing   With a sip of water the morning of your procedure.  9)  Other Instructions: Call [phone] to cancel the morning of your procedure due         to illness or emergency.  10) We will call within 72 hours to assess how you are feeling.

## 2015-10-20 NOTE — Anesthesia Postprocedure Evaluation (Signed)
Anesthesia Post Note  Patient: Matthew Patrick  Procedure(s) Performed: * No procedures listed *  Patient location during evaluation: PACU Anesthesia Type: General Level of consciousness: awake and alert Pain management: pain level controlled Vital Signs Assessment: post-procedure vital signs reviewed and stable Respiratory status: spontaneous breathing, nonlabored ventilation, respiratory function stable and patient connected to nasal cannula oxygen Cardiovascular status: blood pressure returned to baseline and stable Postop Assessment: no signs of nausea or vomiting Anesthetic complications: no    Last Vitals:  Filed Vitals:   10/20/15 1123 10/20/15 1142  BP: 136/66 158/71  Pulse: 92 81  Temp:    Resp: 17 16    Last Pain:  Filed Vitals:   10/20/15 1144  PainSc: 0-No pain                 Molli Barrows

## 2015-10-20 NOTE — H&P (Signed)
Matthew Patrick is an 70 y.o. male.   Chief Complaint: Patient feels that his mood has gotten a little worse over the last several days.  Memory on the other hand has gotten better. No suicidal thoughts but feeling more ruminative and down and negative. HPI: Major depression recurrent severe unresponsive to medication trials has been getting better with ECT. Seems to of had a little setback having missed 5 days of treatment  Past Medical History  Diagnosis Date  . Hypertension   . Hyperlipemia   . Anxiety   . Asthma   . Depression   . Sleep apnea     has c-pap     Past Surgical History  Procedure Laterality Date  . Neck skin tag biopsy    . 2d echocardiogram  03/27/11  . Tonsillectomy and adenoidectomy    . Cataract extraction      Family History  Problem Relation Age of Onset  . Emphysema Mother   . Allergies Mother   . Heart disease Father    Social History:  reports that he quit smoking about 48 years ago. His smoking use included Cigarettes. He has a 5 pack-year smoking history. He does not have any smokeless tobacco history on file. He reports that he drinks about 4.2 oz of alcohol per week. He reports that he does not use illicit drugs.  Allergies: No Known Allergies   (Not in a hospital admission)  No results found for this or any previous visit (from the past 48 hour(s)). No results found.  Review of Systems  Constitutional: Negative.   HENT: Negative.   Eyes: Negative.   Respiratory: Negative.   Cardiovascular: Negative.   Gastrointestinal: Negative.   Musculoskeletal: Negative.   Skin: Negative.   Neurological: Negative.   Psychiatric/Behavioral: Positive for depression. Negative for suicidal ideas, hallucinations, memory loss and substance abuse. The patient is nervous/anxious. The patient does not have insomnia.     Blood pressure 140/75, pulse 72, temperature 97.9 F (36.6 C), temperature source Tympanic, resp. rate 16, weight 72.576 kg (160 lb), SpO2 100  %. Physical Exam  Nursing note and vitals reviewed. Constitutional: He appears well-developed and well-nourished.  HENT:  Head: Normocephalic and atraumatic.  Eyes: Conjunctivae are normal. Pupils are equal, round, and reactive to light.  Neck: Normal range of motion.  Cardiovascular: Regular rhythm and normal heart sounds.   Respiratory: Effort normal. No respiratory distress.  GI: Soft.  Musculoskeletal: Normal range of motion.  Neurological: He is alert.  Skin: Skin is warm and dry.  Psychiatric: Judgment and thought content normal. His speech is delayed. He is slowed. Cognition and memory are normal. He exhibits a depressed mood.     Assessment/Plan Unfortunate that he is at the setback. We will have treatment today and then on Friday. After that there will be no treatment until Wednesday the 12th. He is to see his regular psychiatrist tomorrow.  Alethia Berthold, MD 10/20/2015, 10:32 AM

## 2015-10-20 NOTE — Transfer of Care (Signed)
Immediate Anesthesia Transfer of Care Note  Patient: Matthew Patrick  Procedure(s) Performed: * No procedures listed *  Patient Location: PACU  Anesthesia Type:General  Level of Consciousness: sedated  Airway & Oxygen Therapy: Patient Spontanous Breathing and Patient connected to face mask oxygen  Post-op Assessment: Report given to RN and Post -op Vital signs reviewed and stable  Post vital signs: Reviewed and stable  Last Vitals:  Filed Vitals:   10/20/15 0837 10/20/15 1053  BP: 140/75 160/73  Pulse: 72 86  Temp: 36.6 C 36.9 C  Resp: 16 12    Complications: No apparent anesthesia complications

## 2015-10-20 NOTE — Anesthesia Preprocedure Evaluation (Signed)
Anesthesia Evaluation  Patient identified by MRN, date of birth, ID band Patient awake    Reviewed: Allergy & Precautions, H&P , NPO status , Patient's Chart, lab work & pertinent test results, reviewed documented beta blocker date and time   History of Anesthesia Complications Negative for: history of anesthetic complications  Airway Mallampati: II  TM Distance: <3 FB Neck ROM: full    Dental  (+) Edentulous Upper, Edentulous Lower   Pulmonary shortness of breath and with exertion, asthma , sleep apnea and Continuous Positive Airway Pressure Ventilation , COPD,  COPD inhaler, former smoker,    Pulmonary exam normal        Cardiovascular Exercise Tolerance: Good hypertension, (-) Past MI Normal cardiovascular exam Rhythm:regular Rate:Normal     Neuro/Psych PSYCHIATRIC DISORDERS Anxiety Depression sciatica  Neuromuscular disease negative neurological ROS     GI/Hepatic negative GI ROS, Neg liver ROS,   Endo/Other  negative endocrine ROS  Renal/GU negative Renal ROS  negative genitourinary   Musculoskeletal negative musculoskeletal ROS (+)   Abdominal   Peds negative pediatric ROS (+)  Hematology negative hematology ROS (+) anemia ,   Anesthesia Other Findings Past Medical History:   Hypertension                                                 Hyperlipemia                                                 Anxiety                                                      Asthma                                                       Depression                                                 Past Surgical History:   Neck skin tag biopsy                                          2D Echocardiogram                                03/27/11     TONSILLECTOMY AND ADENOIDECTOMY                               CATARACT EXTRACTION  BMI    Body Mass Index   22.38 kg/m 2     Reproductive/Obstetrics                             Anesthesia Physical  Anesthesia Plan  ASA: III  Anesthesia Plan: General   Post-op Pain Management:    Induction:   Airway Management Planned:   Additional Equipment:   Intra-op Plan:   Post-operative Plan:   Informed Consent: I have reviewed the patients History and Physical, chart, labs and discussed the procedure including the risks, benefits and alternatives for the proposed anesthesia with the patient or authorized representative who has indicated his/her understanding and acceptance.   Dental Advisory Given  Plan Discussed with: CRNA  Anesthesia Plan Comments: (No interval changes )        Anesthesia Quick Evaluation

## 2015-10-22 ENCOUNTER — Encounter: Payer: Self-pay | Admitting: Anesthesiology

## 2015-10-22 ENCOUNTER — Encounter (HOSPITAL_BASED_OUTPATIENT_CLINIC_OR_DEPARTMENT_OTHER)
Admission: RE | Admit: 2015-10-22 | Discharge: 2015-10-22 | Disposition: A | Discharge status: Home / Self Care | Payer: Medicare Other | Source: Ambulatory Visit | Attending: Psychiatry | Admitting: Psychiatry

## 2015-10-22 ENCOUNTER — Other Ambulatory Visit: Payer: Self-pay | Admitting: Psychiatry

## 2015-10-22 DIAGNOSIS — F419 Anxiety disorder, unspecified: Secondary | ICD-10-CM | POA: Diagnosis not present

## 2015-10-22 DIAGNOSIS — F332 Major depressive disorder, recurrent severe without psychotic features: Secondary | ICD-10-CM | POA: Diagnosis not present

## 2015-10-22 DIAGNOSIS — Z87891 Personal history of nicotine dependence: Secondary | ICD-10-CM | POA: Diagnosis not present

## 2015-10-22 DIAGNOSIS — J45909 Unspecified asthma, uncomplicated: Secondary | ICD-10-CM | POA: Diagnosis not present

## 2015-10-22 DIAGNOSIS — I10 Essential (primary) hypertension: Secondary | ICD-10-CM | POA: Diagnosis not present

## 2015-10-22 DIAGNOSIS — E785 Hyperlipidemia, unspecified: Secondary | ICD-10-CM | POA: Diagnosis not present

## 2015-10-22 MED ORDER — SODIUM CHLORIDE 0.9 % IV SOLN
INTRAVENOUS | Status: DC | PRN
Start: 2015-10-22 — End: 2015-10-22
  Administered 2015-10-22: 11:00:00 via INTRAVENOUS

## 2015-10-22 MED ORDER — MIDAZOLAM HCL 2 MG/2ML IJ SOLN: 2.0000 mg | Freq: Once | INTRAMUSCULAR | Status: DC

## 2015-10-22 MED ORDER — KETOROLAC TROMETHAMINE 30 MG/ML IJ SOLN
INTRAMUSCULAR | Status: AC
  Filled 2015-10-22: qty 1

## 2015-10-22 MED ORDER — ONDANSETRON HCL 4 MG/2ML IJ SOLN
4.0000 mg | Freq: Once | INTRAMUSCULAR | Status: AC
  Administered 2015-10-22: 4 mg via INTRAVENOUS

## 2015-10-22 MED ORDER — ONDANSETRON HCL 4 MG/2ML IJ SOLN
INTRAMUSCULAR | Status: AC
  Filled 2015-10-22: qty 2

## 2015-10-22 MED ORDER — SODIUM CHLORIDE 0.9 % IV SOLN
250.0000 mL | Freq: Once | INTRAVENOUS | Status: AC
  Administered 2015-10-22: 10:00:00 via INTRAVENOUS

## 2015-10-22 MED ORDER — SUCCINYLCHOLINE CHLORIDE 200 MG/10ML IV SOSY
PREFILLED_SYRINGE | INTRAVENOUS | Status: DC | PRN
  Administered 2015-10-22: 100 mg via INTRAVENOUS

## 2015-10-22 MED ORDER — ONDANSETRON HCL 4 MG/2ML IJ SOLN: 4.0000 mg | Freq: Once | INTRAMUSCULAR | Status: DC | PRN

## 2015-10-22 MED ORDER — METHOHEXITAL SODIUM 100 MG/10ML IV SOSY
PREFILLED_SYRINGE | INTRAVENOUS | Status: DC | PRN
  Administered 2015-10-22: 80 mg via INTRAVENOUS

## 2015-10-22 MED ORDER — LABETALOL HCL 5 MG/ML IV SOLN
INTRAVENOUS | Status: DC | PRN
  Administered 2015-10-22: 5 mg via INTRAVENOUS

## 2015-10-22 MED ORDER — FENTANYL CITRATE (PF) 100 MCG/2ML IJ SOLN: 25.0000 ug | INTRAMUSCULAR | Status: DC | PRN

## 2015-10-22 MED ORDER — KETOROLAC TROMETHAMINE 30 MG/ML IJ SOLN
30.0000 mg | Freq: Once | INTRAMUSCULAR | Status: AC
  Administered 2015-10-22: 30 mg via INTRAVENOUS

## 2015-10-22 NOTE — H&P (Signed)
Matthew Patrick is an 70 y.o. male.   Chief Complaint: No new complaint. Mood is better. A little bit more memory problem. Not nearly as nervous. HPI: Patient with recurrent depression has responded well to ECT. Doing better with bilateral treatment.  Past Medical History  Diagnosis Date  . Hypertension   . Hyperlipemia   . Anxiety   . Asthma   . Depression   . Sleep apnea     has c-pap     Past Surgical History  Procedure Laterality Date  . Neck skin tag biopsy    . 2d echocardiogram  03/27/11  . Tonsillectomy and adenoidectomy    . Cataract extraction      Family History  Problem Relation Age of Onset  . Emphysema Mother   . Allergies Mother   . Heart disease Father    Social History:  reports that he quit smoking about 48 years ago. His smoking use included Cigarettes. He has a 5 pack-year smoking history. He does not have any smokeless tobacco history on file. He reports that he drinks about 4.2 oz of alcohol per week. He reports that he does not use illicit drugs.  Allergies: No Known Allergies   (Not in a hospital admission)  No results found for this or any previous visit (from the past 48 hour(s)). No results found.  Review of Systems  Constitutional: Negative.   HENT: Negative.   Eyes: Negative.   Respiratory: Negative.   Cardiovascular: Negative.   Gastrointestinal: Negative.   Musculoskeletal: Negative.   Skin: Negative.   Neurological: Negative.   Psychiatric/Behavioral: Positive for memory loss. Negative for depression, suicidal ideas, hallucinations and substance abuse. The patient is not nervous/anxious and does not have insomnia.     Blood pressure 130/65, temperature 98.4 F (36.9 C), temperature source Tympanic, resp. rate 16, height 5\' 10"  (1.778 m), weight 73.029 kg (161 lb), SpO2 99 %. Physical Exam  Nursing note and vitals reviewed. Constitutional: He appears well-developed and well-nourished.  HENT:  Head: Normocephalic and atraumatic.   Eyes: Conjunctivae are normal. Pupils are equal, round, and reactive to light.  Neck: Normal range of motion.  Cardiovascular: Regular rhythm and normal heart sounds.   Respiratory: Effort normal and breath sounds normal. No respiratory distress.  GI: Soft.  Musculoskeletal: Normal range of motion.  Neurological: He is alert.  Skin: Skin is warm and dry.  Psychiatric: He has a normal mood and affect. His behavior is normal. Judgment and thought content normal.     Assessment/Plan Because of vacation we will not have treatments until July 12. Situation explained to patient. We will see him back on that date. Continue current medicine as directed by outpatient doctor.  Alethia Berthold, MD 10/22/2015, 10:23 AM

## 2015-10-22 NOTE — Discharge Instructions (Signed)
1)  The drugs that you have been given will stay in your system until tomorrow so for the       next 24 hours you should not:  A. Drive an automobile  B. Make any legal decisions  C. Drink any alcoholic beverages  2)  You may resume your regular meals upon return home.  3)  A responsible adult must take you home.  Someone should stay with you for a few          hours, then be available by phone for the remainder of the treatment day.  4)  You May experience any of the following symptoms:  Headache, Nausea and a dry mouth (due to the medications you were given),  temporary memory loss and some confusion, or sore muscles (a warm bath  should help this).  If you you experience any of these symptoms let us know on                your return visit.  5)  Report any of the following: any acute discomfort, severe headache, or temperature        greater than 100.5 F.   Also report any unusual redness, swelling, drainage, or pain         at your IV site.    You may report Symptoms to:  [place] at [facility]          Phone: [phone], ECT Department           or Dr. Prescott Gum office [phone]  6)  Your next ECT Treatment is Wednesday, July 12.  We will call 2 days prior to your scheduled appointment for arrival times.  7)  Nothing to eat or drink after midnight the night before your procedure.  8)  Take .     With a sip of water the morning of your procedure.  9)  Other Instructions: Call [phone] to cancel the morning of your procedure due         to illness or emergency.  10) We will call within 72 hours to assess how you are feeling.

## 2015-10-22 NOTE — Procedures (Signed)
ECT SERVICES Physician's Interval Evaluation & Treatment Note  Patient Identification: Matthew Patrick MRN:  [id number] Date of Evaluation:  10/22/2015 TX #: 10  MADRS: 23  MMSE: 30  P.E. Findings:  No change to physical exam. Vitals stable. Lungs and heart clear.  Psychiatric Interval Note:  Mood subjectively feeling better. Patient is smiling more relaxed and less anxious.  Subjective:  Patient is a 70 y.o. male seen for evaluation for Electroconvulsive Therapy. No specific new complaint  Treatment Summary:   []   Right Unilateral             [x]  Bilateral   % Energy : 1.0 ms 70%   Impedance: 700 ohms  Seizure Energy Index: 8514 V squared  Postictal Suppression Index: 78%  Seizure Concordance Index: 93%  Medications  Pre Shock: Zofran 4 mg Toradol 30 mg Brevital 70 mg succinylcholine 80 mg  Post Shock:    Seizure Duration: 41 seconds by EMG 59 seconds by EEG   Comments: Follow-up on July 12   Lungs:  [x]   Clear to auscultation               []  Other:   Heart:    [x]   Regular rhythm             []  irregular rhythm    [x]   Previous H&P reviewed, patient examined and there are NO CHANGES                 []   Previous H&P reviewed, patient examined and there are changes noted.   Alethia Berthold, MD 6/30/201710:58 AM

## 2015-10-22 NOTE — Anesthesia Preprocedure Evaluation (Signed)
Anesthesia Evaluation  Patient identified by MRN, date of birth, ID band Patient awake    Reviewed: Allergy & Precautions, H&P , NPO status , Patient's Chart, lab work & pertinent test results, reviewed documented beta blocker date and time   History of Anesthesia Complications Negative for: history of anesthetic complications  Airway Mallampati: II  TM Distance: <3 FB Neck ROM: full    Dental  (+) Edentulous Upper, Edentulous Lower   Pulmonary shortness of breath and with exertion, asthma , sleep apnea and Continuous Positive Airway Pressure Ventilation , COPD,  COPD inhaler, former smoker,    Pulmonary exam normal        Cardiovascular Exercise Tolerance: Good hypertension, (-) Past MI Normal cardiovascular exam Rhythm:regular Rate:Normal     Neuro/Psych PSYCHIATRIC DISORDERS Anxiety Depression sciatica  Neuromuscular disease negative neurological ROS     GI/Hepatic negative GI ROS, Neg liver ROS,   Endo/Other  negative endocrine ROS  Renal/GU negative Renal ROS  negative genitourinary   Musculoskeletal negative musculoskeletal ROS (+)   Abdominal   Peds negative pediatric ROS (+)  Hematology negative hematology ROS (+) anemia ,   Anesthesia Other Findings Past Medical History:   Hypertension                                                 Hyperlipemia                                                 Anxiety                                                      Asthma                                                       Depression                                                 Past Surgical History:   Neck skin tag biopsy                                          2D Echocardiogram                                03/27/11     TONSILLECTOMY AND ADENOIDECTOMY                               CATARACT EXTRACTION  BMI    Body Mass Index   22.38 kg/m 2     Reproductive/Obstetrics                             Anesthesia Physical Anesthesia Plan  ASA: II  Anesthesia Plan: General   Post-op Pain Management:    Induction:   Airway Management Planned: Nasal Cannula  Additional Equipment:   Intra-op Plan:   Post-operative Plan:   Informed Consent:   Plan Discussed with:   Anesthesia Plan Comments:         Anesthesia Quick Evaluation

## 2015-10-22 NOTE — Transfer of Care (Signed)
Immediate Anesthesia Transfer of Care Note  Patient: Matthew Patrick  Procedure(s) Performed: ECT  Patient Location: PACU  Anesthesia Type:General  Level of Consciousness: awake and patient cooperative  Airway & Oxygen Therapy: Patient Spontanous Breathing and Patient connected to face mask oxygen  Post-op Assessment: Report given to RN  Post vital signs: Reviewed and stable  Last Vitals:  Filed Vitals:   10/22/15 0844 10/22/15 1118  BP: 130/65 150/98  Pulse:  88  Temp: 36.9 C 36.2 C  Resp: 16 13    Last Pain:  Filed Vitals:   10/22/15 1120  PainSc: Asleep         Complications: No apparent anesthesia complications

## 2015-10-22 NOTE — Anesthesia Procedure Notes (Signed)
Date/Time: 10/22/2015 11:09 AM Performed by: Dionne Bucy Pre-anesthesia Checklist: Patient identified, Emergency Drugs available, Suction available and Patient being monitored Patient Re-evaluated:Patient Re-evaluated prior to inductionOxygen Delivery Method: Circle system utilized Preoxygenation: Pre-oxygenation with 100% oxygen Intubation Type: IV induction Ventilation: Mask ventilation without difficulty and Mask ventilation throughout procedure Airway Equipment and Method: Bite block Placement Confirmation: positive ETCO2 Dental Injury: Teeth and Oropharynx as per pre-operative assessment

## 2015-10-22 NOTE — Anesthesia Postprocedure Evaluation (Signed)
Anesthesia Post Note  Patient: Matthew Patrick  Procedure(s) Performed: * No procedures listed *  Patient location during evaluation: PACU Anesthesia Type: General Level of consciousness: awake and alert Pain management: pain level controlled Vital Signs Assessment: post-procedure vital signs reviewed and stable Respiratory status: spontaneous breathing, nonlabored ventilation, respiratory function stable and patient connected to nasal cannula oxygen Cardiovascular status: blood pressure returned to baseline and stable Postop Assessment: no signs of nausea or vomiting Anesthetic complications: no    Last Vitals:  Filed Vitals:   10/22/15 1148 10/22/15 1154  BP: 146/74 147/60  Pulse: 82 77  Temp:    Resp: 11 16    Last Pain:  Filed Vitals:   10/22/15 1156  PainSc: 0-No pain                 Latwan Luchsinger S

## 2015-11-01 ENCOUNTER — Telehealth: Payer: Self-pay | Admitting: *Deleted

## 2015-11-01 NOTE — Anesthesia Postprocedure Evaluation (Signed)
Anesthesia Post Note  Patient: Matthew Patrick  Procedure(s) Performed: * No procedures listed *  Patient location during evaluation: PACU Anesthesia Type: General Level of consciousness: awake and alert Pain management: pain level controlled Vital Signs Assessment: post-procedure vital signs reviewed and stable Respiratory status: spontaneous breathing, nonlabored ventilation, respiratory function stable and patient connected to nasal cannula oxygen Cardiovascular status: blood pressure returned to baseline and stable Postop Assessment: no signs of nausea or vomiting Anesthetic complications: no    Last Vitals: There were no vitals filed for this visit.  Last Pain: There were no vitals filed for this visit.               Molli Barrows

## 2015-11-10 ENCOUNTER — Encounter: Payer: Self-pay | Admitting: Anesthesiology

## 2015-11-10 ENCOUNTER — Encounter
Admission: RE | Admit: 2015-11-10 | Discharge: 2015-11-10 | Disposition: A | Discharge status: Home / Self Care | Payer: Medicare Other | Source: Ambulatory Visit | Attending: Psychiatry | Admitting: Psychiatry

## 2015-11-10 DIAGNOSIS — F419 Anxiety disorder, unspecified: Secondary | ICD-10-CM | POA: Insufficient documentation

## 2015-11-10 DIAGNOSIS — E785 Hyperlipidemia, unspecified: Secondary | ICD-10-CM | POA: Diagnosis not present

## 2015-11-10 DIAGNOSIS — I10 Essential (primary) hypertension: Secondary | ICD-10-CM | POA: Diagnosis not present

## 2015-11-10 DIAGNOSIS — F332 Major depressive disorder, recurrent severe without psychotic features: Secondary | ICD-10-CM | POA: Diagnosis not present

## 2015-11-10 MED ORDER — KETOROLAC TROMETHAMINE 30 MG/ML IJ SOLN
INTRAMUSCULAR | Status: AC
  Administered 2015-11-10: 30 mg via INTRAVENOUS
  Filled 2015-11-10: qty 1

## 2015-11-10 MED ORDER — ONDANSETRON HCL 4 MG/2ML IJ SOLN
4.0000 mg | Freq: Once | INTRAMUSCULAR | Status: AC
Start: 2015-11-10 — End: 2015-11-10
  Administered 2015-11-10: 4 mg via INTRAVENOUS

## 2015-11-10 MED ORDER — SODIUM CHLORIDE 0.9 % IV SOLN
INTRAVENOUS | Status: DC | PRN
  Administered 2015-11-10: 10:00:00 via INTRAVENOUS

## 2015-11-10 MED ORDER — METHOHEXITAL SODIUM 100 MG/10ML IV SOSY
PREFILLED_SYRINGE | INTRAVENOUS | Status: DC | PRN
  Administered 2015-11-10: 80 mg via INTRAVENOUS

## 2015-11-10 MED ORDER — SODIUM CHLORIDE 0.9 % IV SOLN
250.0000 mL | Freq: Once | INTRAVENOUS | Status: AC
Start: 2015-11-10 — End: 2015-11-10
  Administered 2015-11-10: 500 mL via INTRAVENOUS

## 2015-11-10 MED ORDER — SUCCINYLCHOLINE CHLORIDE 20 MG/ML IJ SOLN
INTRAMUSCULAR | Status: DC | PRN
  Administered 2015-11-10: 100 mg via INTRAVENOUS

## 2015-11-10 MED ORDER — KETOROLAC TROMETHAMINE 30 MG/ML IJ SOLN
30.0000 mg | Freq: Once | INTRAMUSCULAR | Status: AC
  Administered 2015-11-10: 30 mg via INTRAVENOUS

## 2015-11-10 MED ORDER — ONDANSETRON HCL 4 MG/2ML IJ SOLN
INTRAMUSCULAR | Status: AC
  Filled 2015-11-10: qty 2

## 2015-11-10 NOTE — Anesthesia Preprocedure Evaluation (Signed)
Anesthesia Evaluation  Patient identified by MRN, date of birth, ID band Patient awake    Reviewed: Allergy & Precautions, H&P , NPO status , Patient's Chart, lab work & pertinent test results, reviewed documented beta blocker date and time   History of Anesthesia Complications Negative for: history of anesthetic complications  Airway Mallampati: II  TM Distance: <3 FB Neck ROM: full    Dental  (+) Edentulous Upper, Edentulous Lower   Pulmonary shortness of breath and with exertion, asthma , sleep apnea and Continuous Positive Airway Pressure Ventilation , COPD,  COPD inhaler, former smoker,    Pulmonary exam normal        Cardiovascular Exercise Tolerance: Good hypertension, (-) Past MI Normal cardiovascular exam Rhythm:regular Rate:Normal     Neuro/Psych PSYCHIATRIC DISORDERS sciatica  Neuromuscular disease negative neurological ROS     GI/Hepatic negative GI ROS, Neg liver ROS,   Endo/Other  negative endocrine ROS  Renal/GU negative Renal ROS  negative genitourinary   Musculoskeletal negative musculoskeletal ROS (+)   Abdominal   Peds negative pediatric ROS (+)  Hematology negative hematology ROS (+) anemia ,   Anesthesia Other Findings Past Medical History:   Hypertension                                                 Hyperlipemia                                                 Anxiety                                                      Asthma                                                       Depression                                                 Past Surgical History:   Neck skin tag biopsy                                          2D Echocardiogram                                03/27/11     TONSILLECTOMY AND ADENOIDECTOMY                               CATARACT EXTRACTION  BMI    Body Mass Index   22.38 kg/m 2     Reproductive/Obstetrics                              Anesthesia Physical  Anesthesia Plan  ASA: II  Anesthesia Plan: General   Post-op Pain Management:    Induction:   Airway Management Planned: Nasal Cannula  Additional Equipment:   Intra-op Plan:   Post-operative Plan:   Informed Consent:   Plan Discussed with:   Anesthesia Plan Comments:         Anesthesia Quick Evaluation

## 2015-11-10 NOTE — Discharge Instructions (Signed)
1)  The drugs that you have been given will stay in your system until tomorrow so for the       next 24 hours you should not:  A. Drive an automobile  B. Make any legal decisions  C. Drink any alcoholic beverages  2)  You may resume your regular meals upon return home.  3)  A responsible adult must take you home.  Someone should stay with you for a few          hours, then be available by phone for the remainder of the treatment day.  4)  You May experience any of the following symptoms:  Headache, Nausea and a dry mouth (due to the medications you were given),  temporary memory loss and some confusion, or sore muscles (a warm bath  should help this).  If you you experience any of these symptoms let us know on                your return visit.  5)  Report any of the following: any acute discomfort, severe headache, or temperature        greater than 100.5 F.   Also report any unusual redness, swelling, drainage, or pain         at your IV site.    You may report Symptoms to:  [facility]          Phone: [phone], ECT Department           or Dr. Prescott Gum office [phone]  6)  Your next ECT Treatment is Day Wednesday  Date August 2nd  We will call 2 days prior to your scheduled appointment for arrival times.  7)  Nothing to eat or drink after midnight the night before your procedure.  8)  Take .  With a sip of water the morning of your procedure.  9)  Other Instructions: Call [phone] to cancel the morning of your procedure due         to illness or emergency.  10) We will call within 72 hours to assess how you are feeling.

## 2015-11-10 NOTE — Anesthesia Postprocedure Evaluation (Signed)
Anesthesia Post Note  Patient: Matthew Patrick  Procedure(s) Performed: * No procedures listed *  Patient location during evaluation: Endoscopy Anesthesia Type: General Level of consciousness: awake and alert Pain management: pain level controlled Vital Signs Assessment: post-procedure vital signs reviewed and stable Respiratory status: spontaneous breathing, nonlabored ventilation, respiratory function stable and patient connected to nasal cannula oxygen Cardiovascular status: blood pressure returned to baseline and stable Postop Assessment: no signs of nausea or vomiting Anesthetic complications: no    Last Vitals:  Filed Vitals:   11/10/15 1156 11/10/15 1202  BP: 141/115 151/70  Pulse: 66 65  Temp:    Resp:  18    Last Pain:  Filed Vitals:   11/10/15 1205  PainSc: 0-No pain                 Martha Clan

## 2015-11-10 NOTE — Transfer of Care (Signed)
Immediate Anesthesia Transfer of Care Note  Patient: Matthew Patrick  Procedure(s) Performed: * No procedures listed *  Patient Location: PACU  Anesthesia Type:General  Level of Consciousness: sedated  Airway & Oxygen Therapy: Patient Spontanous Breathing and Patient connected to face mask oxygen  Post-op Assessment: Report given to RN and Post -op Vital signs reviewed and stable  Post vital signs: Reviewed and stable  Last Vitals:  Filed Vitals:   11/10/15 0850 11/10/15 1104  BP: 137/64 167/79  Pulse: 63 78  Temp: 36.2 C   Resp: 16 13    Last Pain:  Filed Vitals:   11/10/15 1104  PainSc: 0-No pain         Complications: No apparent anesthesia complications

## 2015-11-10 NOTE — H&P (Signed)
Matthew Patrick is an 70 y.o. male.   Chief Complaint: Patient reports that his mood has been a little bit worse for the last few days. Up to then was feeling good. Also reports some mild short-term memory problems chronically. No new physical complaints. HPI: Patient with recurrent severe depression who has responded to ECT and has moved into maintenance phase.  Past Medical History  Diagnosis Date  . Hypertension   . Hyperlipemia   . Anxiety   . Asthma   . Depression   . Sleep apnea     has c-pap     Past Surgical History  Procedure Laterality Date  . Neck skin tag biopsy    . 2d echocardiogram  03/27/11  . Tonsillectomy and adenoidectomy    . Cataract extraction      Family History  Problem Relation Age of Onset  . Emphysema Mother   . Allergies Mother   . Heart disease Father    Social History:  reports that he quit smoking about 48 years ago. His smoking use included Cigarettes. He has a 5 pack-year smoking history. He does not have any smokeless tobacco history on file. He reports that he drinks about 4.2 oz of alcohol per week. He reports that he does not use illicit drugs.  Allergies: No Known Allergies   (Not in a hospital admission)  No results found for this or any previous visit (from the past 48 hour(s)). No results found.  Review of Systems  Constitutional: Negative.   HENT: Negative.   Eyes: Negative.   Respiratory: Negative.   Cardiovascular: Negative.   Gastrointestinal: Negative.   Musculoskeletal: Negative.   Skin: Negative.   Neurological: Negative.   Psychiatric/Behavioral: Positive for depression and memory loss. Negative for suicidal ideas, hallucinations and substance abuse. The patient is not nervous/anxious and does not have insomnia.     Blood pressure 137/64, pulse 63, temperature 97.1 F (36.2 C), temperature source Oral, resp. rate 16, height 5\' 10"  (1.778 m), weight 73.936 kg (163 lb), SpO2 100 %. Physical Exam  Nursing note and vitals  reviewed. Constitutional: He appears well-developed and well-nourished.  HENT:  Head: Normocephalic and atraumatic.  Eyes: Conjunctivae are normal. Pupils are equal, round, and reactive to light.  Neck: Normal range of motion.  Cardiovascular: Regular rhythm and normal heart sounds.   Respiratory: Effort normal. No respiratory distress.  GI: Soft.  Musculoskeletal: Normal range of motion.  Neurological: He is alert.  Skin: Skin is warm and dry.  Psychiatric: His speech is normal. Judgment and thought content normal. His mood appears anxious. His affect is blunt. He is slowed. He exhibits abnormal recent memory.     Assessment/Plan Overall doing well. We discussed the multiple contributing factors to his memory problems and discussed his medications. ECT today and we will follow-up in 2 weeks as the 3 week gap seems to of been a little too long.  Alethia Berthold, MD 11/10/2015, 10:05 AM

## 2015-11-10 NOTE — Procedures (Signed)
ECT SERVICES Physician's Interval Evaluation & Treatment Note  Patient Identification: Maricus Ricciardelli MRN:  [id number] Date of Evaluation:  11/10/2015 TX #: 11  MADRS:   MMSE:   P.E. Findings:  No change to physical exam. Lungs clear heart normal. Vital signs normal.  Psychiatric Interval Note:  Feeling a little bit more down just the last few days after some improvement. Also having some implants of short-term memory impairment.  Subjective:  Patient is a 70 y.o. male seen for evaluation for Electroconvulsive Therapy. Mood slightly more down and also appears to be a little more anxious.  Treatment Summary:   []   Right Unilateral             [x]  Bilateral   % Energy : 1.0 ms, 70%   Impedance: 530 ohms  Seizure Energy Index: 7218 V squared  Postictal Suppression Index: 90%  Seizure Concordance Index: 97%  Medications  Pre Shock: Zofran 4 mg, Toradol 30 g, Brevital 70 mg, succinylcholine 80 mg  Post Shock:    Seizure Duration: 45 seconds by EMG, 61 seconds by EEG   Comments: Next treatment in 2 weeks on August 2.   Lungs:  []   Clear to auscultation               []  Other:   Heart:    []   Regular rhythm             []  irregular rhythm    []   Previous H&P reviewed, patient examined and there are NO CHANGES                 []   Previous H&P reviewed, patient examined and there are changes noted.   Alethia Berthold, MD 7/19/201710:48 AM

## 2015-11-15 ENCOUNTER — Telehealth: Payer: Self-pay | Admitting: *Deleted

## 2015-11-23 ENCOUNTER — Other Ambulatory Visit: Payer: Self-pay | Admitting: Psychiatry

## 2015-11-24 ENCOUNTER — Inpatient Hospital Stay: Admission: RE | Admit: 2015-11-24 | Payer: Self-pay | Source: Ambulatory Visit

## 2015-11-24 ENCOUNTER — Encounter: Payer: Self-pay | Admitting: Anesthesiology

## 2015-11-25 ENCOUNTER — Other Ambulatory Visit: Payer: Self-pay | Admitting: Psychiatry

## 2015-11-26 ENCOUNTER — Encounter
Admission: RE | Admit: 2015-11-26 | Discharge: 2015-11-26 | Disposition: A | Discharge status: Home / Self Care | Payer: Medicare Other | Source: Ambulatory Visit | Attending: Psychiatry | Admitting: Psychiatry

## 2015-11-26 ENCOUNTER — Encounter: Payer: Self-pay | Admitting: Anesthesiology

## 2015-11-26 DIAGNOSIS — F332 Major depressive disorder, recurrent severe without psychotic features: Secondary | ICD-10-CM | POA: Diagnosis not present

## 2015-11-26 DIAGNOSIS — F339 Major depressive disorder, recurrent, unspecified: Secondary | ICD-10-CM | POA: Diagnosis not present

## 2015-11-26 DIAGNOSIS — R413 Other amnesia: Secondary | ICD-10-CM | POA: Diagnosis not present

## 2015-11-26 DIAGNOSIS — F428 Other obsessive-compulsive disorder: Secondary | ICD-10-CM | POA: Diagnosis not present

## 2015-11-26 DIAGNOSIS — F429 Obsessive-compulsive disorder, unspecified: Secondary | ICD-10-CM | POA: Insufficient documentation

## 2015-11-26 DIAGNOSIS — F419 Anxiety disorder, unspecified: Secondary | ICD-10-CM | POA: Diagnosis not present

## 2015-11-26 DIAGNOSIS — I10 Essential (primary) hypertension: Secondary | ICD-10-CM | POA: Insufficient documentation

## 2015-11-26 MED ORDER — ONDANSETRON HCL 4 MG/2ML IJ SOLN
INTRAMUSCULAR | Status: AC
  Administered 2015-11-26: 4 mg via INTRAVENOUS
  Filled 2015-11-26: qty 2

## 2015-11-26 MED ORDER — KETOROLAC TROMETHAMINE 30 MG/ML IJ SOLN
INTRAMUSCULAR | Status: AC
  Administered 2015-11-26: 30 mg via INTRAVENOUS
  Filled 2015-11-26: qty 1

## 2015-11-26 MED ORDER — KETOROLAC TROMETHAMINE 30 MG/ML IJ SOLN
30.0000 mg | Freq: Once | INTRAMUSCULAR | Status: AC
  Administered 2015-11-26: 30 mg via INTRAVENOUS

## 2015-11-26 MED ORDER — SODIUM CHLORIDE 0.9 % IV SOLN
INTRAVENOUS | Status: DC | PRN
  Administered 2015-11-26: 11:00:00 via INTRAVENOUS

## 2015-11-26 MED ORDER — ONDANSETRON HCL 4 MG/2ML IJ SOLN
4.0000 mg | Freq: Once | INTRAMUSCULAR | Status: AC
  Administered 2015-11-26: 4 mg via INTRAVENOUS

## 2015-11-26 MED ORDER — SUCCINYLCHOLINE CHLORIDE 200 MG/10ML IV SOSY
PREFILLED_SYRINGE | INTRAVENOUS | Status: DC | PRN
  Administered 2015-11-26: 100 mg via INTRAVENOUS

## 2015-11-26 MED ORDER — SODIUM CHLORIDE 0.9 % IV SOLN
250.0000 mL | Freq: Once | INTRAVENOUS | Status: AC
  Administered 2015-11-26: 500 mL via INTRAVENOUS

## 2015-11-26 MED ORDER — METHOHEXITAL SODIUM 100 MG/10ML IV SOSY
PREFILLED_SYRINGE | INTRAVENOUS | Status: DC | PRN
  Administered 2015-11-26: 80 mg via INTRAVENOUS

## 2015-11-26 NOTE — Anesthesia Procedure Notes (Signed)
Date/Time: 11/26/2015 10:50 AM Performed by: Dionne Bucy Pre-anesthesia Checklist: Patient identified, Emergency Drugs available, Suction available and Patient being monitored Patient Re-evaluated:Patient Re-evaluated prior to inductionOxygen Delivery Method: Circle system utilized Preoxygenation: Pre-oxygenation with 100% oxygen Intubation Type: IV induction Ventilation: Mask ventilation without difficulty and Mask ventilation throughout procedure Airway Equipment and Method: Bite block Placement Confirmation: positive ETCO2 Dental Injury: Teeth and Oropharynx as per pre-operative assessment

## 2015-11-26 NOTE — Discharge Instructions (Signed)
1)  The drugs that you have been given will stay in your system until tomorrow so for the       next 24 hours you should not:  A. Drive an automobile  B. Make any legal decisions  C. Drink any alcoholic beverages  2)  You may resume your regular meals upon return home.  3)  A responsible adult must take you home.  Someone should stay with you for a few          hours, then be available by phone for the remainder of the treatment day.  4)  You May experience any of the following symptoms:  Headache, Nausea and a dry mouth (due to the medications you were given),  temporary memory loss and some confusion, or sore muscles (a warm bath  should help this).  If you you experience any of these symptoms let us know on                your return visit.  5)  Report any of the following: any acute discomfort, severe headache, or temperature        greater than 100.5 F.   Also report any unusual redness, swelling, drainage, or pain         at your IV site.    You may report Symptoms to:  [place]          Phone: [phone], ECT Department           or Dr. Prescott Gum office [phone]  6)  Your next ECT Treatment is Friday, Aug. 18.  We will call 2 days prior to your scheduled appointment for arrival times.  7)  Nothing to eat or drink after midnight the night before your procedure.  8)  Take .  With a sip of water the morning of your procedure.  9)  Other Instructions: Call [phone] to cancel the morning of your procedure due         to illness or emergency.  10) We will call within 72 hours to assess how you are feeling.

## 2015-11-26 NOTE — Transfer of Care (Signed)
Immediate Anesthesia Transfer of Care Note  Patient: Matthew Patrick  Procedure(s) Performed: ECT  Patient Location: PACU  Anesthesia Type:General  Level of Consciousness: sedated  Airway & Oxygen Therapy: Patient Spontanous Breathing and Patient connected to face mask oxygen  Post-op Assessment: Report given to RN and Post -op Vital signs reviewed and stable  Post vital signs: Reviewed and stable  Last Vitals:  Vitals:   11/26/15 0910 11/26/15 1101  BP: 135/68 (!) 171/78  Pulse: 80 80  Resp: 18 (!) 8  Temp: 36.9 C 36.6 C    Last Pain:  Vitals:   11/26/15 0910  TempSrc: Oral         Complications: No apparent anesthesia complications

## 2015-11-26 NOTE — Procedures (Signed)
ECT SERVICES Physician's Interval Evaluation & Treatment Note  Patient Identification: Matthew Patrick MRN:  [id number] Date of Evaluation:  11/26/2015 TX #: 12  MADRS:   MMSE:   P.E. Findings:  Heart and lungs normal. Vital signs unremarkable. No change to physical exam  Psychiatric Interval Note:  Pointedly more anxious and OCD. Anhedonic.  Subjective:  Patient is a 70 y.o. male seen for evaluation for Electroconvulsive Therapy. Anxiety bad  Treatment Summary:   []   Right Unilateral             [x]  Bilateral   % Energy : 1.0 ms, 70%   Impedance: 760 ohms  Seizure Energy Index: 6237 V squared  Postictal Suppression Index: 76%  Seizure Concordance Index: 94%  Medications  Pre Shock: Zofran 4 mg, Toradol 30 mg, Brevital 70 mg, succinylcholine 80 mg  Post Shock:    Seizure Duration: 46 seconds by EMG, 72 seconds by EEG   Comments: Patient is complaining of anxiety. Wife is very clear that his mood and overall functioning has improved. I'm going to have him come back in 2 weeks but no sooner because I am concerned about his memory. He is to follow-up with his regular outpatient psychiatrist   Lungs:  [x]   Clear to auscultation               []  Other:   Heart:    [x]   Regular rhythm             []  irregular rhythm    [x]   Previous H&P reviewed, patient examined and there are NO CHANGES                 []   Previous H&P reviewed, patient examined and there are changes noted.   Alethia Berthold, MD 8/4/201710:33 AM

## 2015-11-26 NOTE — H&P (Signed)
Matthew Patrick is an 70 y.o. male.   Chief Complaint: Patient reports that he is having more OCD and anxiety. Wife reports that he is at least more physically active. He still feels anhedonia HPI: Patient with severe depression and also a history of OCD has shown partial response to treatment but has significant memory problems when we do them to close together  Past Medical History:  Diagnosis Date  . Anxiety   . Asthma   . Depression   . Hyperlipemia   . Hypertension   . Sleep apnea    has c-pap     Past Surgical History:  Procedure Laterality Date  . 2D Echocardiogram  03/27/11  . CATARACT EXTRACTION    . Neck skin tag biopsy    . TONSILLECTOMY AND ADENOIDECTOMY      Family History  Problem Relation Age of Onset  . Heart disease Father   . Emphysema Mother   . Allergies Mother    Social History:  reports that he quit smoking about 48 years ago. His smoking use included Cigarettes. He has a 5.00 pack-year smoking history. He does not have any smokeless tobacco history on file. He reports that he drinks about 4.2 oz of alcohol per week . He reports that he does not use drugs.  Allergies: No Known Allergies   (Not in a hospital admission)  No results found for this or any previous visit (from the past 48 hour(s)). No results found.  Review of Systems  Constitutional: Negative.   HENT: Negative.   Eyes: Negative.   Respiratory: Negative.   Cardiovascular: Negative.   Gastrointestinal: Negative.   Musculoskeletal: Negative.   Skin: Negative.   Neurological: Negative.   Psychiatric/Behavioral: Positive for depression and memory loss. Negative for hallucinations, substance abuse and suicidal ideas. The patient is nervous/anxious. The patient does not have insomnia.     Blood pressure 135/68, pulse 80, temperature 98.5 F (36.9 C), temperature source Oral, resp. rate 18, height 5\' 10"  (1.778 m), weight 73.5 kg (162 lb), SpO2 97 %. Physical Exam  Nursing note and vitals  reviewed. Constitutional: He appears well-developed and well-nourished.  HENT:  Head: Normocephalic and atraumatic.  Eyes: Conjunctivae are normal. Pupils are equal, round, and reactive to light.  Neck: Normal range of motion.  Cardiovascular: Regular rhythm and normal heart sounds.   Respiratory: Effort normal and breath sounds normal. No respiratory distress.  GI: Soft.  Musculoskeletal: Normal range of motion.  Neurological: He is alert.  Skin: Skin is warm and dry.  Psychiatric: Judgment normal. His affect is blunt. His speech is delayed. He is slowed. He expresses no suicidal ideation. He exhibits abnormal recent memory.     Assessment/Plan Treatment today and I'm going to propose again having him come back in about 2 weeks. We discussed medications but I encouraged him to go back and follow-up with his outpatient psychiatrist.  Alethia Berthold, MD 11/26/2015, 10:31 AM

## 2015-11-26 NOTE — Anesthesia Preprocedure Evaluation (Signed)
Anesthesia Evaluation  Patient identified by MRN, date of birth, ID band Patient awake    Reviewed: Allergy & Precautions, H&P , NPO status , Patient's Chart, lab work & pertinent test results, reviewed documented beta blocker date and time   History of Anesthesia Complications Negative for: history of anesthetic complications  Airway Mallampati: II  TM Distance: <3 FB Neck ROM: full    Dental  (+) Edentulous Upper, Edentulous Lower   Pulmonary shortness of breath and with exertion, asthma , sleep apnea and Continuous Positive Airway Pressure Ventilation , COPD,  COPD inhaler, former smoker,    Pulmonary exam normal        Cardiovascular Exercise Tolerance: Good hypertension, (-) Past MI Normal cardiovascular exam Rhythm:regular Rate:Normal     Neuro/Psych PSYCHIATRIC DISORDERS sciatica  Neuromuscular disease negative neurological ROS     GI/Hepatic negative GI ROS, Neg liver ROS,   Endo/Other  negative endocrine ROS  Renal/GU negative Renal ROS  negative genitourinary   Musculoskeletal negative musculoskeletal ROS (+)   Abdominal   Peds negative pediatric ROS (+)  Hematology negative hematology ROS (+) anemia ,   Anesthesia Other Findings Past Medical History:   Hypertension                                                 Hyperlipemia                                                 Anxiety                                                      Asthma                                                       Depression                                                 Past Surgical History:   Neck skin tag biopsy                                          2D Echocardiogram                                03/27/11     TONSILLECTOMY AND ADENOIDECTOMY                               CATARACT EXTRACTION  BMI    Body Mass Index   22.38 kg/m 2     Reproductive/Obstetrics                              Anesthesia Physical  Anesthesia Plan  ASA: II  Anesthesia Plan: General   Post-op Pain Management:    Induction:   Airway Management Planned: Nasal Cannula  Additional Equipment:   Intra-op Plan:   Post-operative Plan:   Informed Consent:   Plan Discussed with:   Anesthesia Plan Comments:         Anesthesia Quick Evaluation

## 2015-11-26 NOTE — Anesthesia Postprocedure Evaluation (Signed)
Anesthesia Post Note  Patient: Daryll Doto  Procedure(s) Performed: * No procedures listed *  Patient location during evaluation: PACU Anesthesia Type: General Level of consciousness: awake and alert Pain management: pain level controlled Vital Signs Assessment: post-procedure vital signs reviewed and stable Respiratory status: spontaneous breathing, nonlabored ventilation, respiratory function stable and patient connected to nasal cannula oxygen Cardiovascular status: blood pressure returned to baseline and stable Postop Assessment: no signs of nausea or vomiting Anesthetic complications: no    Last Vitals:  Vitals:   11/26/15 1129 11/26/15 1138  BP: (!) 149/78 (!) 154/72  Pulse: 77 70  Resp: 17 16  Temp: 36.6 C     Last Pain:  Vitals:   11/26/15 1129  TempSrc:   PainSc: 0-No pain                 Martha Clan

## 2015-12-10 ENCOUNTER — Inpatient Hospital Stay
Admission: RE | Admit: 2015-12-10 | Discharge: 2015-12-10 | Disposition: A | Discharge status: Home / Self Care | Payer: Self-pay | Source: Ambulatory Visit

## 2015-12-10 ENCOUNTER — Telehealth: Payer: Self-pay | Admitting: *Deleted

## 2015-12-10 NOTE — Procedures (Signed)
Patient canceled treatment today.

## 2015-12-20 ENCOUNTER — Encounter: Payer: Self-pay | Admitting: Certified Registered Nurse Anesthetist

## 2015-12-20 ENCOUNTER — Inpatient Hospital Stay: Admission: RE | Admit: 2015-12-20 | Payer: Self-pay | Source: Ambulatory Visit

## 2015-12-20 ENCOUNTER — Telehealth: Payer: Self-pay

## 2015-12-20 DIAGNOSIS — F339 Major depressive disorder, recurrent, unspecified: Secondary | ICD-10-CM | POA: Diagnosis not present

## 2015-12-20 DIAGNOSIS — I10 Essential (primary) hypertension: Secondary | ICD-10-CM | POA: Diagnosis not present

## 2015-12-20 DIAGNOSIS — F429 Obsessive-compulsive disorder, unspecified: Secondary | ICD-10-CM | POA: Diagnosis not present

## 2015-12-20 DIAGNOSIS — R413 Other amnesia: Secondary | ICD-10-CM | POA: Diagnosis not present

## 2016-01-04 DIAGNOSIS — Z23 Encounter for immunization: Secondary | ICD-10-CM | POA: Diagnosis not present

## 2016-01-15 ENCOUNTER — Other Ambulatory Visit: Payer: Self-pay | Admitting: Psychiatry

## 2016-01-17 ENCOUNTER — Encounter: Payer: Self-pay | Admitting: Certified Registered"

## 2016-01-17 ENCOUNTER — Encounter
Admission: RE | Admit: 2016-01-17 | Discharge: 2016-01-17 | Disposition: A | Discharge status: Home / Self Care | Payer: Medicare Other | Source: Ambulatory Visit | Attending: Psychiatry | Admitting: Psychiatry

## 2016-01-17 DIAGNOSIS — I10 Essential (primary) hypertension: Secondary | ICD-10-CM | POA: Diagnosis not present

## 2016-01-17 DIAGNOSIS — G4730 Sleep apnea, unspecified: Secondary | ICD-10-CM | POA: Diagnosis not present

## 2016-01-17 DIAGNOSIS — J45909 Unspecified asthma, uncomplicated: Secondary | ICD-10-CM | POA: Diagnosis not present

## 2016-01-17 DIAGNOSIS — F418 Other specified anxiety disorders: Secondary | ICD-10-CM | POA: Diagnosis not present

## 2016-01-17 DIAGNOSIS — F332 Major depressive disorder, recurrent severe without psychotic features: Secondary | ICD-10-CM

## 2016-01-17 DIAGNOSIS — Z87891 Personal history of nicotine dependence: Secondary | ICD-10-CM | POA: Insufficient documentation

## 2016-01-17 MED ORDER — ONDANSETRON HCL 4 MG/2ML IJ SOLN
4.0000 mg | Freq: Once | INTRAMUSCULAR | Status: AC
  Administered 2016-01-17: 4 mg via INTRAVENOUS

## 2016-01-17 MED ORDER — KETOROLAC TROMETHAMINE 30 MG/ML IJ SOLN
30.0000 mg | Freq: Once | INTRAMUSCULAR | Status: AC
  Administered 2016-01-17: 30 mg via INTRAVENOUS

## 2016-01-17 MED ORDER — SUCCINYLCHOLINE CHLORIDE 20 MG/ML IJ SOLN
INTRAMUSCULAR | Status: DC | PRN
  Administered 2016-01-17: 100 mg via INTRAVENOUS

## 2016-01-17 MED ORDER — MIDAZOLAM HCL 2 MG/2ML IJ SOLN: 2.0000 mg | Freq: Once | INTRAMUSCULAR | Status: DC

## 2016-01-17 MED ORDER — METHOHEXITAL SODIUM 100 MG/10ML IV SOSY
PREFILLED_SYRINGE | INTRAVENOUS | Status: DC | PRN
  Administered 2016-01-17: 80 mg via INTRAVENOUS

## 2016-01-17 MED ORDER — FENTANYL CITRATE (PF) 100 MCG/2ML IJ SOLN: 25.0000 ug | INTRAMUSCULAR | Status: DC | PRN

## 2016-01-17 MED ORDER — ONDANSETRON HCL 4 MG/2ML IJ SOLN: 4.0000 mg | Freq: Once | INTRAMUSCULAR | Status: DC | PRN

## 2016-01-17 MED ORDER — SODIUM CHLORIDE 0.9 % IV SOLN
INTRAVENOUS | Status: DC | PRN
  Administered 2016-01-17: 10:00:00 via INTRAVENOUS

## 2016-01-17 MED ORDER — SODIUM CHLORIDE 0.9 % IV SOLN
500.0000 mL | Freq: Once | INTRAVENOUS | Status: AC
  Administered 2016-01-17: 500 mL via INTRAVENOUS

## 2016-01-17 NOTE — Anesthesia Preprocedure Evaluation (Signed)
Anesthesia Evaluation  Patient identified by MRN, date of birth, ID band Patient awake    Reviewed: Allergy & Precautions, H&P , NPO status , Patient's Chart, lab work & pertinent test results, reviewed documented beta blocker date and time   Airway Mallampati: II   Neck ROM: full    Dental  (+) Poor Dentition   Pulmonary neg pulmonary ROS, shortness of breath and with exertion, asthma , sleep apnea and Continuous Positive Airway Pressure Ventilation , COPD,  COPD inhaler, former smoker,    Pulmonary exam normal        Cardiovascular hypertension, negative cardio ROS Normal cardiovascular exam Rhythm:regular Rate:Normal     Neuro/Psych PSYCHIATRIC DISORDERS  Neuromuscular disease negative neurological ROS  negative psych ROS   GI/Hepatic negative GI ROS, Neg liver ROS,   Endo/Other  negative endocrine ROS  Renal/GU negative Renal ROS  negative genitourinary   Musculoskeletal   Abdominal   Peds  Hematology negative hematology ROS (+) anemia ,   Anesthesia Other Findings Past Medical History: No date: Anxiety No date: Asthma No date: Depression No date: Hyperlipemia No date: Hypertension No date: Sleep apnea     Comment: has c-pap  Past Surgical History: 03/27/11: 2D Echocardiogram No date: CATARACT EXTRACTION No date: Neck skin tag biopsy No date: TONSILLECTOMY AND ADENOIDECTOMY BMI    Body Mass Index:  23.24 kg/m     Reproductive/Obstetrics                             Anesthesia Physical Anesthesia Plan  ASA: III  Anesthesia Plan: General   Post-op Pain Management:    Induction:   Airway Management Planned:   Additional Equipment:   Intra-op Plan:   Post-operative Plan:   Informed Consent: I have reviewed the patients History and Physical, chart, labs and discussed the procedure including the risks, benefits and alternatives for the proposed anesthesia with the  patient or authorized representative who has indicated his/her understanding and acceptance.   Dental Advisory Given  Plan Discussed with: CRNA  Anesthesia Plan Comments:         Anesthesia Quick Evaluation

## 2016-01-17 NOTE — Procedures (Signed)
ECT SERVICES Physician's Interval Evaluation & Treatment Note  Patient Identification: Matthew Patrick MRN:  [id number] Date of Evaluation:  01/17/2016 TX #: 13  MADRS:   MMSE:   P.E. Findings:  Lungs clear. Heart normal. Blood pressure slightly elevated. Otherwise unremarkable.  Psychiatric Interval Note:  Back to feeling very depressed anxious and hopeless. Not obviously psychotic. No intention of suicide. Cooperative with treatment.  Subjective:  Patient is a 70 y.o. male seen for evaluation for Electroconvulsive Therapy. "I'm feeling terrible"  Treatment Summary:   []   Right Unilateral             [x]  Bilateral   % Energy : 1.0 ms, 70%   Impedance: 1580 ohms  Seizure Energy Index: 9114 V squared  Postictal Suppression Index: 77%  Seizure Concordance Index: 96%  Medications  Pre Shock: Zofran 4 mg, Toradol 30 mg, Brevital 70 mg, succinylcholine 80 mg  Post Shock:    Seizure Duration: 41 seconds by EMG, 65 seconds by EEG   Comments: Follow-up Wednesday   Lungs:  [x]   Clear to auscultation               []  Other:   Heart:    [x]   Regular rhythm             []  irregular rhythm    [x]   Previous H&P reviewed, patient examined and there are NO CHANGES                 []   Previous H&P reviewed, patient examined and there are changes noted.   Alethia Berthold, MD 9/25/201710:25 AM

## 2016-01-17 NOTE — Discharge Instructions (Signed)
1)  The drugs that you have been given will stay in your system until tomorrow so for the       next 24 hours you should not:  A. Drive an automobile  B. Make any legal decisions  C. Drink any alcoholic beverages  2)  You may resume your regular meals upon return home.  3)  A responsible adult must take you home.  Someone should stay with you for a few          hours, then be available by phone for the remainder of the treatment day.  4)  You May experience any of the following symptoms:  Headache, Nausea and a dry mouth (due to the medications you were given),  temporary memory loss and some confusion, or sore muscles (a warm bath  should help this).  If you you experience any of these symptoms let us know on                your return visit.  5)  Report any of the following: any acute discomfort, severe headache, or temperature        greater than 100.5 F.   Also report any unusual redness, swelling, drainage, or pain         at your IV site.    You may report Symptoms to:  [place] at [facility]          Phone: [phone], ECT Department           or Dr. Prescott Gum office [phone]  6)  Your next ECT Treatment is Day Wednesday  Date September 27  We will call 2 days prior to your scheduled appointment for arrival times.  7)  Nothing to eat or drink after midnight the night before your procedure.  8)  TakeTrintellix     With a sip of water the morning of your procedure.  9)  Other Instructions: Call [phone] to cancel the morning of your procedure due         to illness or emergency.  10) We will call within 72 hours to assess how you are feeling.

## 2016-01-17 NOTE — H&P (Signed)
Matthew Patrick is an 70 y.o. male.   Chief Complaint: 70 year old man with history of recurrent severe depression returns with a worsening of his depression symptoms over the past week. HPI: Long-standing depression and anxiety disorder which responded well to ECT. Has not been here since the first week of August  Past Medical History:  Diagnosis Date  . Anxiety   . Asthma   . Depression   . Hyperlipemia   . Hypertension   . Sleep apnea    has c-pap     Past Surgical History:  Procedure Laterality Date  . 2D Echocardiogram  03/27/11  . CATARACT EXTRACTION    . Neck skin tag biopsy    . TONSILLECTOMY AND ADENOIDECTOMY      Family History  Problem Relation Age of Onset  . Heart disease Father   . Emphysema Mother   . Allergies Mother    Social History:  reports that he quit smoking about 48 years ago. His smoking use included Cigarettes. He has a 5.00 pack-year smoking history. He does not have any smokeless tobacco history on file. He reports that he drinks about 4.2 oz of alcohol per week . He reports that he does not use drugs.  Allergies: No Known Allergies   (Not in a hospital admission)  No results found for this or any previous visit (from the past 48 hour(s)). No results found.  Review of Systems  Constitutional: Negative.   HENT: Negative.   Eyes: Negative.   Respiratory: Negative.   Cardiovascular: Negative.   Gastrointestinal: Negative.   Musculoskeletal: Negative.   Skin: Negative.   Neurological: Negative.   Psychiatric/Behavioral: Positive for depression and suicidal ideas. Negative for hallucinations, memory loss and substance abuse. The patient is nervous/anxious and has insomnia.     Blood pressure (!) 157/70, pulse (!) 57, temperature 98.3 F (36.8 C), temperature source Oral, resp. rate 16, height 5\' 10"  (1.778 m), weight 73.5 kg (162 lb), SpO2 98 %. Physical Exam  Nursing note and vitals reviewed. Constitutional: He appears well-developed and  well-nourished.  HENT:  Head: Normocephalic and atraumatic.  Eyes: Conjunctivae are normal. Pupils are equal, round, and reactive to light.  Neck: Normal range of motion.  Cardiovascular: Regular rhythm and normal heart sounds.   Respiratory: Effort normal. No respiratory distress.  GI: Soft.  Musculoskeletal: Normal range of motion.  Neurological: He is alert.  Skin: Skin is warm and dry.  Psychiatric: His speech is normal. His mood appears anxious. He is slowed. Cognition and memory are normal. He expresses inappropriate judgment. He exhibits a depressed mood. He expresses suicidal ideation. He expresses no suicidal plans.     Assessment/Plan Treatment today and my recommendation is to also have treatment on Wednesday and then reassess his degree of improvement. No change to medicine except did not take his lorazepam the night prior to treatment  Alethia Berthold, MD 01/17/2016, 10:23 AM

## 2016-01-17 NOTE — Transfer of Care (Signed)
Immediate Anesthesia Transfer of Care Note  Patient: Matthew Patrick  Procedure(s) Performed: * No procedures listed *  Patient Location: PACU  Anesthesia Type:General  Level of Consciousness: awake, oriented and patient cooperative  Airway & Oxygen Therapy: Patient Spontanous Breathing and Patient connected to face mask oxygen  Post-op Assessment: Report given to RN, Post -op Vital signs reviewed and stable and Patient moving all extremities X 4  Post vital signs: Reviewed and stable  Last Vitals:  Vitals:   01/17/16 0851  BP: (!) 157/70  Pulse: (!) 57  Resp: 16  Temp: 36.8 C    Last Pain:  Vitals:   01/17/16 0851  TempSrc: Oral         Complications: No apparent anesthesia complications

## 2016-01-18 ENCOUNTER — Other Ambulatory Visit: Payer: Self-pay | Admitting: Psychiatry

## 2016-01-18 NOTE — Anesthesia Postprocedure Evaluation (Signed)
Anesthesia Post Note  Patient: Matthew Patrick  Procedure(s) Performed: * No procedures listed *  Patient location during evaluation: PACU Anesthesia Type: General Level of consciousness: awake and alert Pain management: pain level controlled Vital Signs Assessment: post-procedure vital signs reviewed and stable Respiratory status: spontaneous breathing, nonlabored ventilation, respiratory function stable and patient connected to nasal cannula oxygen Cardiovascular status: blood pressure returned to baseline and stable Postop Assessment: no signs of nausea or vomiting Anesthetic complications: no    Last Vitals:  Vitals:   01/17/16 1051 01/17/16 1101  BP: 122/78 (!) 170/83  Pulse: 72 71  Resp: 15 12  Temp:  36.8 C    Last Pain:  Vitals:   01/17/16 0851  TempSrc: Oral                 Molli Barrows

## 2016-01-19 ENCOUNTER — Encounter: Payer: Self-pay | Admitting: Certified Registered"

## 2016-01-19 ENCOUNTER — Encounter
Admission: RE | Admit: 2016-01-19 | Discharge: 2016-01-19 | Disposition: A | Discharge status: Home / Self Care | Payer: Medicare Other | Source: Ambulatory Visit | Attending: Psychiatry | Admitting: Psychiatry

## 2016-01-19 DIAGNOSIS — F332 Major depressive disorder, recurrent severe without psychotic features: Secondary | ICD-10-CM | POA: Insufficient documentation

## 2016-01-19 DIAGNOSIS — F338 Other recurrent depressive disorders: Secondary | ICD-10-CM | POA: Diagnosis not present

## 2016-01-19 DIAGNOSIS — Z87891 Personal history of nicotine dependence: Secondary | ICD-10-CM | POA: Diagnosis not present

## 2016-01-19 DIAGNOSIS — G4730 Sleep apnea, unspecified: Secondary | ICD-10-CM | POA: Diagnosis not present

## 2016-01-19 DIAGNOSIS — J45909 Unspecified asthma, uncomplicated: Secondary | ICD-10-CM | POA: Diagnosis not present

## 2016-01-19 DIAGNOSIS — I10 Essential (primary) hypertension: Secondary | ICD-10-CM | POA: Diagnosis not present

## 2016-01-19 MED ORDER — SODIUM CHLORIDE 0.9 % IV SOLN
INTRAVENOUS | Status: DC | PRN
  Administered 2016-01-19: 10:00:00 via INTRAVENOUS

## 2016-01-19 MED ORDER — FENTANYL CITRATE (PF) 100 MCG/2ML IJ SOLN: 25.0000 ug | INTRAMUSCULAR | Status: DC | PRN

## 2016-01-19 MED ORDER — KETOROLAC TROMETHAMINE 30 MG/ML IJ SOLN
INTRAMUSCULAR | Status: AC
  Administered 2016-01-19: 30 mg via INTRAVENOUS
  Filled 2016-01-19: qty 1

## 2016-01-19 MED ORDER — ONDANSETRON HCL 4 MG/2ML IJ SOLN
INTRAMUSCULAR | Status: AC
  Administered 2016-01-19: 4 mg via INTRAVENOUS
  Filled 2016-01-19: qty 2

## 2016-01-19 MED ORDER — ONDANSETRON HCL 4 MG/2ML IJ SOLN: 4.0000 mg | Freq: Once | INTRAMUSCULAR | Status: DC | PRN

## 2016-01-19 MED ORDER — SUCCINYLCHOLINE CHLORIDE 200 MG/10ML IV SOSY
PREFILLED_SYRINGE | INTRAVENOUS | Status: DC | PRN
  Administered 2016-01-19: 100 mg via INTRAVENOUS

## 2016-01-19 MED ORDER — SODIUM CHLORIDE 0.9 % IV SOLN
500.0000 mL | Freq: Once | INTRAVENOUS | Status: AC
  Administered 2016-01-19: 500 mL via INTRAVENOUS

## 2016-01-19 MED ORDER — ONDANSETRON HCL 4 MG/2ML IJ SOLN
4.0000 mg | Freq: Once | INTRAMUSCULAR | Status: AC
  Administered 2016-01-19: 4 mg via INTRAVENOUS

## 2016-01-19 MED ORDER — METHOHEXITAL SODIUM 100 MG/10ML IV SOSY
PREFILLED_SYRINGE | INTRAVENOUS | Status: DC | PRN
  Administered 2016-01-19: 80 mg via INTRAVENOUS

## 2016-01-19 MED ORDER — KETOROLAC TROMETHAMINE 30 MG/ML IJ SOLN
30.0000 mg | Freq: Once | INTRAMUSCULAR | Status: AC
  Administered 2016-01-19: 30 mg via INTRAVENOUS

## 2016-01-19 MED ORDER — ESMOLOL HCL 100 MG/10ML IV SOLN
INTRAVENOUS | Status: DC | PRN
  Administered 2016-01-19: 30 mg via INTRAVENOUS

## 2016-01-19 NOTE — Anesthesia Preprocedure Evaluation (Signed)
Anesthesia Evaluation  Patient identified by MRN, date of birth, ID band Patient awake    Reviewed: Allergy & Precautions, H&P , NPO status , Patient's Chart, lab work & pertinent test results, reviewed documented beta blocker date and time   Airway Mallampati: II   Neck ROM: full    Dental  (+) Poor Dentition   Pulmonary neg pulmonary ROS, shortness of breath and with exertion, asthma , sleep apnea and Continuous Positive Airway Pressure Ventilation , COPD,  COPD inhaler, former smoker,    Pulmonary exam normal        Cardiovascular hypertension, Pt. on medications and Pt. on home beta blockers negative cardio ROS Normal cardiovascular exam Rhythm:regular Rate:Normal     Neuro/Psych PSYCHIATRIC DISORDERS Anxiety Depression  Neuromuscular disease negative neurological ROS  negative psych ROS   GI/Hepatic negative GI ROS, Neg liver ROS,   Endo/Other  negative endocrine ROSSIADH syndrome  Renal/GU negative Renal ROS  negative genitourinary   Musculoskeletal   Abdominal   Peds negative pediatric ROS (+)  Hematology  (+) anemia ,   Anesthesia Other Findings Past Medical History: No date: Anxiety No date: Asthma No date: Depression No date: Hyperlipemia No date: Hypertension No date: Sleep apnea     Comment: has c-pap  Past Surgical History: 03/27/11: 2D Echocardiogram No date: CATARACT EXTRACTION No date: Neck skin tag biopsy No date: TONSILLECTOMY AND ADENOIDECTOMY BMI    Body Mass Index:  23.24 kg/m     Reproductive/Obstetrics                             Anesthesia Physical  Anesthesia Plan  ASA: III  Anesthesia Plan: General   Post-op Pain Management:    Induction:   Airway Management Planned:   Additional Equipment:   Intra-op Plan:   Post-operative Plan:   Informed Consent: I have reviewed the patients History and Physical, chart, labs and discussed the  procedure including the risks, benefits and alternatives for the proposed anesthesia with the patient or authorized representative who has indicated his/her understanding and acceptance.   Dental Advisory Given  Plan Discussed with: CRNA  Anesthesia Plan Comments:         Anesthesia Quick Evaluation

## 2016-01-19 NOTE — H&P (Signed)
Matthew Patrick is an 70 y.o. male.   Chief Complaint: Patient continues to feel very depressed and anxious HPI: Chronic severe depression with a return in treatment after being away from ECT for 6 weeks  Past Medical History:  Diagnosis Date  . Anxiety   . Asthma   . Depression   . Hyperlipemia   . Hypertension   . Sleep apnea    has c-pap     Past Surgical History:  Procedure Laterality Date  . 2D Echocardiogram  03/27/11  . CATARACT EXTRACTION    . Neck skin tag biopsy    . TONSILLECTOMY AND ADENOIDECTOMY      Family History  Problem Relation Age of Onset  . Heart disease Father   . Emphysema Mother   . Allergies Mother    Social History:  reports that he quit smoking about 48 years ago. His smoking use included Cigarettes. He has a 5.00 pack-year smoking history. He has never used smokeless tobacco. He reports that he drinks about 4.2 oz of alcohol per week . He reports that he does not use drugs.  Allergies: No Known Allergies   (Not in a hospital admission)  No results found for this or any previous visit (from the past 48 hour(s)). No results found.  Review of Systems  Constitutional: Negative.   HENT: Negative.   Eyes: Negative.   Respiratory: Negative.   Cardiovascular: Negative.   Gastrointestinal: Negative.   Musculoskeletal: Negative.   Skin: Negative.   Neurological: Negative.   Psychiatric/Behavioral: Positive for depression and memory loss. Negative for hallucinations, substance abuse and suicidal ideas. The patient is nervous/anxious and has insomnia.     Blood pressure (!) 148/66, temperature 98 F (36.7 C), temperature source Oral, resp. rate 16, height 5\' 10"  (1.778 m), weight 73.5 kg (162 lb), SpO2 100 %. Physical Exam  Constitutional: He appears well-developed and well-nourished.  HENT:  Head: Normocephalic and atraumatic.  Eyes: Conjunctivae are normal. Pupils are equal, round, and reactive to light.  Neck: Normal range of motion.   Cardiovascular: Normal heart sounds.   Respiratory: Effort normal.  GI: Soft.  Musculoskeletal: Normal range of motion.  Neurological: He is alert.  Skin: Skin is warm and dry.  Psychiatric: His mood appears anxious. His speech is delayed. He is slowed. He expresses inappropriate judgment. He exhibits a depressed mood. He expresses suicidal ideation. He exhibits abnormal recent memory and abnormal remote memory.     Assessment/Plan Treatment today and I would like to see him back Wednesday as well until we see a more clear-cut definite improvement.  Alethia Berthold, MD 01/19/2016, 10:55 AM

## 2016-01-19 NOTE — Procedures (Signed)
ECT SERVICES Physician's Interval Evaluation & Treatment Note  Patient Identification: Matthew Patrick MRN:  [id number] Date of Evaluation:  01/19/2016 TX #: 14  MADRS:   MMSE:   P.E. Findings:  No change to physical exam. Heart and lungs normal. Vitals unremarkable  Psychiatric Interval Note:  Patient says he is still feeling terrible. He appears however to be a little calmer and less agitated.  Subjective:  Patient is a 70 y.o. male seen for evaluation for Electroconvulsive Therapy. Patient doesn't notice any difference.  Treatment Summary:   []   Right Unilateral             [x]  Bilateral   % Energy : 1.0 ms 70%   Impedance: 610 ohms  Seizure Energy Index: 8557 V squared  Postictal Suppression Index: 88%  Seizure Concordance Index: 92%  Medications  Pre Shock: Zofran 4 mg Toradol 30 mg Brevital 70 mg succinylcholine 80 mg  Post Shock:    Seizure Duration: 47 seconds by EMG, 65 seconds by EEG   Comments: Patient will return Friday and we will continue if possible doing 3 times a week until we get a clear improvement and can switch to maintenance. Spoke with his wife today who is in full agreement.   Lungs:  [x]   Clear to auscultation               []  Other:   Heart:    [x]   Regular rhythm             []  irregular rhythm    [x]   Previous H&P reviewed, patient examined and there are NO CHANGES                 []   Previous H&P reviewed, patient examined and there are changes noted.   Alethia Berthold, MD 9/27/201710:57 AM

## 2016-01-19 NOTE — Anesthesia Postprocedure Evaluation (Signed)
Anesthesia Post Note  Patient: Matthew Patrick  Procedure(s) Performed: * No procedures listed *  Patient location during evaluation: PACU Anesthesia Type: General Level of consciousness: awake and alert and oriented Pain management: pain level controlled Vital Signs Assessment: post-procedure vital signs reviewed and stable Respiratory status: spontaneous breathing Cardiovascular status: blood pressure returned to baseline Anesthetic complications: no    Last Vitals:  Vitals:   01/19/16 1142 01/19/16 1201  BP: (!) 162/83 (!) 178/84  Pulse: 70 (!) 59  Resp: 14 16  Temp: 36.5 C     Last Pain:  Vitals:   01/19/16 0909  TempSrc: Oral                 Birda Didonato

## 2016-01-19 NOTE — Discharge Instructions (Signed)
1)  The drugs that you have been given will stay in your system until tomorrow so for the       next 24 hours you should not:  A. Drive an automobile  B. Make any legal decisions  C. Drink any alcoholic beverages  2)  You may resume your regular meals upon return home.  3)  A responsible adult must take you home.  Someone should stay with you for a few          hours, then be available by phone for the remainder of the treatment day.  4)  You May experience any of the following symptoms:  Headache, Nausea and a dry mouth (due to the medications you were given),  temporary memory loss and some confusion, or sore muscles (a warm bath  should help this).  If you you experience any of these symptoms let us know on                your return visit.  5)  Report any of the following: any acute discomfort, severe headache, or temperature        greater than 100.5 F.   Also report any unusual redness, swelling, drainage, or pain         at your IV site.    You may report Symptoms to:  [place] [facility]          Phone: [phone], ECT Department           or Dr. Prescott Gum office [phone]  6)  Your next ECT Treatment is Friday, Sept. 29, 2017.  We will call 2 days prior to your scheduled appointment for arrival times.  7)  Nothing to eat or drink after midnight the night before your procedure.  8)  Take.     With a sip of water the morning of your procedure.  9)  Other Instructions: Call [phone] to cancel the morning of your procedure due         to illness or emergency.  10) We will call within 72 hours to assess how you are feeling.

## 2016-01-19 NOTE — Transfer of Care (Signed)
Immediate Anesthesia Transfer of Care Note  Patient: Matthew Patrick  Procedure(s) Performed: * No procedures listed *  Patient Location: PACU  Anesthesia Type:General  Level of Consciousness: sedated and responds to stimulation  Airway & Oxygen Therapy: Patient Spontanous Breathing and Patient connected to face mask oxygen  Post-op Assessment: Report given to RN and Post -op Vital signs reviewed and stable  Post vital signs: Reviewed and stable  Last Vitals:  Vitals:   01/19/16 1112 01/19/16 1113  BP: (!) 154/97 (!) 154/97  Pulse: 82 75  Resp: 12 12  Temp: 36.3 C     Last Pain:  Vitals:   01/19/16 0909  TempSrc: Oral         Complications: No apparent anesthesia complications

## 2016-01-20 ENCOUNTER — Other Ambulatory Visit: Payer: Self-pay | Admitting: Psychiatry

## 2016-01-21 ENCOUNTER — Encounter
Admission: RE | Admit: 2016-01-21 | Discharge: 2016-01-21 | Disposition: A | Discharge status: Home / Self Care | Payer: Medicare Other | Source: Ambulatory Visit | Attending: Psychiatry | Admitting: Psychiatry

## 2016-01-21 ENCOUNTER — Encounter: Payer: Self-pay | Admitting: Anesthesiology

## 2016-01-21 DIAGNOSIS — G4730 Sleep apnea, unspecified: Secondary | ICD-10-CM | POA: Insufficient documentation

## 2016-01-21 DIAGNOSIS — Z87891 Personal history of nicotine dependence: Secondary | ICD-10-CM | POA: Diagnosis not present

## 2016-01-21 DIAGNOSIS — D649 Anemia, unspecified: Secondary | ICD-10-CM | POA: Diagnosis not present

## 2016-01-21 DIAGNOSIS — F332 Major depressive disorder, recurrent severe without psychotic features: Secondary | ICD-10-CM | POA: Diagnosis not present

## 2016-01-21 DIAGNOSIS — F419 Anxiety disorder, unspecified: Secondary | ICD-10-CM | POA: Diagnosis not present

## 2016-01-21 DIAGNOSIS — J45909 Unspecified asthma, uncomplicated: Secondary | ICD-10-CM | POA: Diagnosis not present

## 2016-01-21 DIAGNOSIS — F338 Other recurrent depressive disorders: Secondary | ICD-10-CM | POA: Diagnosis not present

## 2016-01-21 MED ORDER — KETOROLAC TROMETHAMINE 30 MG/ML IJ SOLN
30.0000 mg | Freq: Once | INTRAMUSCULAR | Status: AC
  Administered 2016-01-21: 30 mg via INTRAVENOUS

## 2016-01-21 MED ORDER — SODIUM CHLORIDE 0.9 % IV SOLN
INTRAVENOUS | Status: DC | PRN
  Administered 2016-01-21: 11:00:00 via INTRAVENOUS

## 2016-01-21 MED ORDER — SUCCINYLCHOLINE CHLORIDE 200 MG/10ML IV SOSY
PREFILLED_SYRINGE | INTRAVENOUS | Status: DC | PRN
  Administered 2016-01-21: 100 mg via INTRAVENOUS

## 2016-01-21 MED ORDER — KETOROLAC TROMETHAMINE 30 MG/ML IJ SOLN
INTRAMUSCULAR | Status: AC
  Administered 2016-01-21: 30 mg via INTRAVENOUS
  Filled 2016-01-21: qty 1

## 2016-01-21 MED ORDER — SODIUM CHLORIDE 0.9 % IV SOLN
500.0000 mL | Freq: Once | INTRAVENOUS | Status: AC
  Administered 2016-01-21: 500 mL via INTRAVENOUS

## 2016-01-21 MED ORDER — ONDANSETRON HCL 4 MG/2ML IJ SOLN: 4.0000 mg | Freq: Once | INTRAMUSCULAR | Status: DC | PRN

## 2016-01-21 MED ORDER — METHOHEXITAL SODIUM 100 MG/10ML IV SOSY
PREFILLED_SYRINGE | INTRAVENOUS | Status: DC | PRN
  Administered 2016-01-21: 80 mg via INTRAVENOUS

## 2016-01-21 NOTE — Transfer of Care (Signed)
Immediate Anesthesia Transfer of Care Note  Patient: Matthew Patrick  Procedure(s) Performed: ECT  Patient Location: PACU  Anesthesia Type:General  Level of Consciousness: sedated  Airway & Oxygen Therapy: Patient Spontanous Breathing and Patient connected to face mask oxygen  Post-op Assessment: Report given to RN and Post -op Vital signs reviewed and stable  Post vital signs: Reviewed and stable  Last Vitals:  Vitals:   01/21/16 0915 01/21/16 1117  BP: (!) 155/76 (!) 167/76  Pulse: 61 82  Resp: 16 18  Temp: 36.4 C 36.7 C    Last Pain:  Vitals:   01/21/16 0915  TempSrc: Oral      Patients Stated Pain Goal: 0 ([id number])  Complications: No apparent anesthesia complications

## 2016-01-21 NOTE — Procedures (Signed)
ECT SERVICES Physician's Interval Evaluation & Treatment Note  Patient Identification: Matthew Patrick MRN:  [id number] Date of Evaluation:  01/21/2016 TX #: 14  MADRS:   MMSE:   P.E. Findings:  Vital signs stable. Heart and lungs normal. No new findings.  Psychiatric Interval Note:  Feeling much better. Smiling. Not describing his mood badly. Clearly better.  Subjective:  Patient is a 70 y.o. male seen for evaluation for Electroconvulsive Therapy. Patient describes himself as feeling "not too bad" which is a dramatic improvement from previously.  Treatment Summary:   []   Right Unilateral             [x]  Bilateral   % Energy : 1.0 ms, 70%   Impedance: 1100 ohms  Seizure Energy Index: 6407 V squared  Postictal Suppression Index: 91%  Seizure Concordance Index: 96%  Medications  Pre Shock: Zofran 4 mg, Toradol 30 mg, Brevital 70 mg, succinylcholine 80 mg  Post Shock:    Seizure Duration: 45 seconds by EMG, 63 seconds by EEG   Comments: We will see him back in 1 week and hope that we can go into a maintenance schedule.   Lungs:  [x]   Clear to auscultation               []  Other:   Heart:    [x]   Regular rhythm             []  irregular rhythm    [x]   Previous H&P reviewed, patient examined and there are NO CHANGES                 []   Previous H&P reviewed, patient examined and there are changes noted.   Alethia Berthold, MD 9/29/201710:59 AM

## 2016-01-21 NOTE — Anesthesia Procedure Notes (Signed)
Date/Time: 01/21/2016 11:08 AM Performed by: Dionne Bucy Pre-anesthesia Checklist: Patient identified, Emergency Drugs available, Suction available and Patient being monitored Patient Re-evaluated:Patient Re-evaluated prior to inductionOxygen Delivery Method: Circle system utilized Preoxygenation: Pre-oxygenation with 100% oxygen Intubation Type: IV induction Ventilation: Mask ventilation without difficulty and Mask ventilation throughout procedure Airway Equipment and Method: Bite block Placement Confirmation: positive ETCO2 Dental Injury: Teeth and Oropharynx as per pre-operative assessment

## 2016-01-21 NOTE — Anesthesia Preprocedure Evaluation (Signed)
Anesthesia Evaluation  Patient identified by MRN, date of birth, ID band Patient awake    Reviewed: Allergy & Precautions, H&P , NPO status , Patient's Chart, lab work & pertinent test results, reviewed documented beta blocker date and time   Airway Mallampati: II   Neck ROM: full    Dental  (+) Poor Dentition   Pulmonary neg pulmonary ROS, shortness of breath and with exertion, asthma , sleep apnea and Continuous Positive Airway Pressure Ventilation , COPD,  COPD inhaler, former smoker,    Pulmonary exam normal        Cardiovascular hypertension, Pt. on medications and Pt. on home beta blockers negative cardio ROS Normal cardiovascular exam Rhythm:regular Rate:Normal     Neuro/Psych PSYCHIATRIC DISORDERS  Neuromuscular disease negative neurological ROS  negative psych ROS   GI/Hepatic negative GI ROS, Neg liver ROS,   Endo/Other  negative endocrine ROSSIADH syndrome  Renal/GU negative Renal ROS  negative genitourinary   Musculoskeletal   Abdominal   Peds negative pediatric ROS (+)  Hematology  (+) anemia ,   Anesthesia Other Findings Past Medical History: No date: Anxiety No date: Asthma No date: Depression No date: Hyperlipemia No date: Hypertension No date: Sleep apnea     Comment: has c-pap  Past Surgical History: 03/27/11: 2D Echocardiogram No date: CATARACT EXTRACTION No date: Neck skin tag biopsy No date: TONSILLECTOMY AND ADENOIDECTOMY BMI    Body Mass Index:  23.24 kg/m     Reproductive/Obstetrics                             Anesthesia Physical  Anesthesia Plan  ASA: III  Anesthesia Plan: General   Post-op Pain Management:    Induction:   Airway Management Planned:   Additional Equipment:   Intra-op Plan:   Post-operative Plan:   Informed Consent: I have reviewed the patients History and Physical, chart, labs and discussed the procedure including the  risks, benefits and alternatives for the proposed anesthesia with the patient or authorized representative who has indicated his/her understanding and acceptance.   Dental Advisory Given  Plan Discussed with: CRNA  Anesthesia Plan Comments:         Anesthesia Quick Evaluation

## 2016-01-21 NOTE — H&P (Signed)
Matthew Patrick is an 70 y.o. male.   Chief Complaint: Patient is doing much better. No specific new complaint. HPI: History of recurrent severe depression responding well to ECT. We've done 3 treatments as of today as a new index course.  Past Medical History:  Diagnosis Date  . Anxiety   . Asthma   . Depression   . Hyperlipemia   . Hypertension   . Sleep apnea    has c-pap     Past Surgical History:  Procedure Laterality Date  . 2D Echocardiogram  03/27/11  . CATARACT EXTRACTION    . Neck skin tag biopsy    . TONSILLECTOMY AND ADENOIDECTOMY      Family History  Problem Relation Age of Onset  . Heart disease Father   . Emphysema Mother   . Allergies Mother    Social History:  reports that he quit smoking about 48 years ago. His smoking use included Cigarettes. He has a 5.00 pack-year smoking history. He has never used smokeless tobacco. He reports that he drinks about 4.2 oz of alcohol per week . He reports that he does not use drugs.  Allergies: No Known Allergies   (Not in a hospital admission)  No results found for this or any previous visit (from the past 48 hour(s)). No results found.  Review of Systems  Constitutional: Negative.   HENT: Negative.   Eyes: Negative.   Respiratory: Negative.   Cardiovascular: Negative.   Gastrointestinal: Negative.   Musculoskeletal: Negative.   Skin: Negative.   Neurological: Negative.   Psychiatric/Behavioral: Positive for memory loss. Negative for depression, hallucinations, substance abuse and suicidal ideas. The patient is not nervous/anxious and does not have insomnia.     Blood pressure (!) 155/76, pulse 61, temperature 97.6 F (36.4 C), temperature source Oral, resp. rate 16, height 5\' 10"  (1.778 m), weight 72.6 kg (160 lb), SpO2 100 %. Physical Exam  Nursing note and vitals reviewed. Constitutional: He appears well-developed and well-nourished.  HENT:  Head: Normocephalic and atraumatic.  Eyes: Conjunctivae are  normal. Pupils are equal, round, and reactive to light.  Neck: Normal range of motion.  Cardiovascular: Regular rhythm and normal heart sounds.   Respiratory: Effort normal. No respiratory distress.  GI: Soft.  Musculoskeletal: Normal range of motion.  Neurological: He is alert.  Skin: Skin is warm and dry.  Psychiatric: He has a normal mood and affect. His behavior is normal. Judgment and thought content normal.     Assessment/Plan Seems markedly better. We will see him back in 1 week and then try to come up with a workable maintenance schedule if he is doing well.  Alethia Berthold, MD 01/21/2016, 10:57 AM

## 2016-01-21 NOTE — Anesthesia Postprocedure Evaluation (Signed)
Anesthesia Post Note  Patient: Matthew Patrick  Procedure(s) Performed: * No procedures listed *  Patient location during evaluation: PACU Anesthesia Type: General Level of consciousness: awake and alert Pain management: pain level controlled Vital Signs Assessment: post-procedure vital signs reviewed and stable Respiratory status: spontaneous breathing, nonlabored ventilation, respiratory function stable and patient connected to nasal cannula oxygen Cardiovascular status: blood pressure returned to baseline and stable Postop Assessment: no signs of nausea or vomiting Anesthetic complications: no    Last Vitals:  Vitals:   01/21/16 1147 01/21/16 1156  BP: (!) 167/89 (!) 178/85  Pulse: 70 68  Resp: 14 15  Temp: 36.5 C     Last Pain:  Vitals:   01/21/16 0915  TempSrc: Oral                 Martha Clan

## 2016-01-21 NOTE — Discharge Instructions (Signed)
1)  The drugs that you have been given will stay in your system until tomorrow so for the       next 24 hours you should not:  A. Drive an automobile  B. Make any legal decisions  C. Drink any alcoholic beverages  2)  You may resume your regular meals upon return home.  3)  A responsible adult must take you home.  Someone should stay with you for a few          hours, then be available by phone for the remainder of the treatment day.  4)  You May experience any of the following symptoms:  Headache, Nausea and a dry mouth (due to the medications you were given),  temporary memory loss and some confusion, or sore muscles (a warm bath  should help this).  If you you experience any of these symptoms let us know on                your return visit.  5)  Report any of the following: any acute discomfort, severe headache, or temperature        greater than 100.5 F.   Also report any unusual redness, swelling, drainage, or pain         at your IV site.    You may report Symptoms to:  [place] at [facility]          Phone: [phone], ECT Department           or Dr. Prescott Gum office [phone]  6)  Your next ECT Treatment is Day Friday  Date October 6  We will call 2 days prior to your scheduled appointment for arrival times.  7)  Nothing to eat or drink after midnight the night before your procedure.  8)  Take .    With a sip of water the morning of your procedure.  9)  Other Instructions: Call [phone] to cancel the morning of your procedure due         to illness or emergency.  10) We will call within 72 hours to assess how you are feeling.

## 2016-01-26 ENCOUNTER — Telehealth: Payer: Self-pay

## 2016-01-27 ENCOUNTER — Other Ambulatory Visit: Payer: Self-pay | Admitting: Psychiatry

## 2016-01-28 ENCOUNTER — Encounter: Payer: Self-pay | Admitting: Anesthesiology

## 2016-01-28 ENCOUNTER — Encounter
Admission: RE | Admit: 2016-01-28 | Discharge: 2016-01-28 | Disposition: A | Discharge status: Home / Self Care | Payer: Medicare Other | Source: Ambulatory Visit | Attending: Psychiatry | Admitting: Psychiatry

## 2016-01-28 DIAGNOSIS — D649 Anemia, unspecified: Secondary | ICD-10-CM | POA: Diagnosis not present

## 2016-01-28 DIAGNOSIS — Z87891 Personal history of nicotine dependence: Secondary | ICD-10-CM | POA: Diagnosis not present

## 2016-01-28 DIAGNOSIS — Z8249 Family history of ischemic heart disease and other diseases of the circulatory system: Secondary | ICD-10-CM | POA: Insufficient documentation

## 2016-01-28 DIAGNOSIS — G4730 Sleep apnea, unspecified: Secondary | ICD-10-CM | POA: Diagnosis not present

## 2016-01-28 DIAGNOSIS — F329 Major depressive disorder, single episode, unspecified: Secondary | ICD-10-CM | POA: Diagnosis not present

## 2016-01-28 DIAGNOSIS — Z9849 Cataract extraction status, unspecified eye: Secondary | ICD-10-CM | POA: Insufficient documentation

## 2016-01-28 DIAGNOSIS — I10 Essential (primary) hypertension: Secondary | ICD-10-CM | POA: Insufficient documentation

## 2016-01-28 DIAGNOSIS — F332 Major depressive disorder, recurrent severe without psychotic features: Secondary | ICD-10-CM

## 2016-01-28 DIAGNOSIS — E785 Hyperlipidemia, unspecified: Secondary | ICD-10-CM | POA: Diagnosis not present

## 2016-01-28 DIAGNOSIS — K5900 Constipation, unspecified: Secondary | ICD-10-CM | POA: Diagnosis not present

## 2016-01-28 DIAGNOSIS — F338 Other recurrent depressive disorders: Secondary | ICD-10-CM | POA: Diagnosis not present

## 2016-01-28 DIAGNOSIS — F419 Anxiety disorder, unspecified: Secondary | ICD-10-CM | POA: Diagnosis not present

## 2016-01-28 MED ORDER — METHOHEXITAL SODIUM 100 MG/10ML IV SOSY
PREFILLED_SYRINGE | INTRAVENOUS | Status: DC | PRN
  Administered 2016-01-28: 80 mg via INTRAVENOUS

## 2016-01-28 MED ORDER — SODIUM CHLORIDE 0.9 % IV SOLN
INTRAVENOUS | Status: DC | PRN
  Administered 2016-01-28: 10:00:00 via INTRAVENOUS

## 2016-01-28 MED ORDER — SUCCINYLCHOLINE CHLORIDE 200 MG/10ML IV SOSY
PREFILLED_SYRINGE | INTRAVENOUS | Status: DC | PRN
  Administered 2016-01-28: 100 mg via INTRAVENOUS

## 2016-01-28 NOTE — Anesthesia Postprocedure Evaluation (Signed)
Anesthesia Post Note  Patient: Matthew Patrick  Procedure(s) Performed: * No procedures listed *  Patient location during evaluation: PACU Anesthesia Type: General Level of consciousness: awake and alert Pain management: pain level controlled Vital Signs Assessment: post-procedure vital signs reviewed and stable Respiratory status: spontaneous breathing and respiratory function stable Cardiovascular status: blood pressure returned to baseline and stable Anesthetic complications: no    Last Vitals:  Vitals:   01/28/16 0910 01/28/16 1043  BP: (!) 159/79 (!) 172/98  Pulse: 63 80  Resp: 16 11  Temp: 36.4 C 36.7 C    Last Pain:  Vitals:   01/28/16 1043  TempSrc:   PainSc: Asleep                 KEPHART,WILLIAM K

## 2016-01-28 NOTE — Discharge Instructions (Signed)
1)  The drugs that you have been given will stay in your system until tomorrow so for the       next 24 hours you should not:  A. Drive an automobile  B. Make any legal decisions  C. Drink any alcoholic beverages  2)  You may resume your regular meals upon return home.  3)  A responsible adult must take you home.  Someone should stay with you for a few          hours, then be available by phone for the remainder of the treatment day.  4)  You May experience any of the following symptoms:  Headache, Nausea and a dry mouth (due to the medications you were given),  temporary memory loss and some confusion, or sore muscles (a warm bath  should help this).  If you you experience any of these symptoms let us know on                your return visit.  5)  Report any of the following: any acute discomfort, severe headache, or temperature        greater than 100.5 F.   Also report any unusual redness, swelling, drainage, or pain         at your IV site.    You may report Symptoms to:  [place] at [facility]          Phone: [phone], ECT Department           or Dr. Prescott Gum office [phone]  6)  Your next ECT Treatment is Day Friday  Date February 11, 2016  We will call 2 days prior to your scheduled appointment for arrival times.  7)  Nothing to eat or drink after midnight the night before your procedure.  8)  Take Trintellix     With a sip of water the morning of your procedure.  9)  Other Instructions: Call [phone] to cancel the morning of your procedure due         to illness or emergency.  10) We will call within 72 hours to assess how you are feeling.

## 2016-01-28 NOTE — Transfer of Care (Signed)
Immediate Anesthesia Transfer of Care Note  Patient: Matthew Patrick  Procedure(s) Performed: ECT  Patient Location: PACU  Anesthesia Type:General  Level of Consciousness: sedated  Airway & Oxygen Therapy: Patient Spontanous Breathing and Patient connected to face mask oxygen  Post-op Assessment: Report given to RN and Post -op Vital signs reviewed and stable  Post vital signs: Reviewed and stable  Last Vitals:  Vitals:   01/28/16 0910 01/28/16 1043  BP: (!) 159/79 (!) 172/98  Pulse: 63 80  Resp: 16 11  Temp: 36.4 C 36.7 C    Last Pain:  Vitals:   01/28/16 0910  TempSrc: Oral         Complications: No apparent anesthesia complications

## 2016-01-28 NOTE — Anesthesia Procedure Notes (Signed)
Performed by: Bailie Christenbury Pre-anesthesia Checklist: Patient identified, Emergency Drugs available, Suction available and Patient being monitored Patient Re-evaluated:Patient Re-evaluated prior to inductionOxygen Delivery Method: Circle system utilized Preoxygenation: Pre-oxygenation with 100% oxygen Intubation Type: IV induction Ventilation: Mask ventilation without difficulty and Mask ventilation throughout procedure Airway Equipment and Method: Bite block Placement Confirmation: positive ETCO2 Dental Injury: Teeth and Oropharynx as per pre-operative assessment        

## 2016-01-28 NOTE — Procedures (Signed)
ECT SERVICES Physician's Interval Evaluation & Treatment Note  Patient Identification: Matthew Patrick MRN:  [id number] Date of Evaluation:  01/28/2016 TX #: 16  MADRS:   MMSE:   P.E. Findings:  No change to physical exam. Blood pressure slightly high. Heart and lungs normal.  Psychiatric Interval Note:  Mood is feeling overall better and continues to be stable.  Subjective:  Patient is a 70 y.o. male seen for evaluation for Electroconvulsive Therapy. Had a transient episode of memory loss earlier this week but not consistent   Treatment Summary:   []   Right Unilateral             [x]  Bilateral   % Energy : 1.0 ms 70%   Impedance: 1170 ohms  Seizure Energy Index: 6255 V squared  Postictal Suppression Index: 75%  Seizure Concordance Index: 95%  Medications  Pre Shock: Toradol 30 mg, Brevital 70 mg, succinylcholine 80 mg  Post Shock:    Seizure Duration: 47 seconds by EMG, 68 seconds by EEG   Comments: Follow-up in 2 weeks on the 20th   Lungs:  [x]   Clear to auscultation               []  Other:   Heart:    [x]   Regular rhythm             []  irregular rhythm    []   Previous H&P reviewed, patient examined and there are NO CHANGES                 []   Previous H&P reviewed, patient examined and there are changes noted.   Alethia Berthold, MD 10/6/201710:26 AM

## 2016-01-28 NOTE — H&P (Signed)
Matthew Patrick is an 71 y.o. male.   Chief Complaint: Had one episode of memory loss transiently over the last week otherwise mood is doing well. HPI: Recurrent severe depression showing good improvement with ECT moving into maintenance phase.  Past Medical History:  Diagnosis Date  . Anxiety   . Asthma   . Depression   . Hyperlipemia   . Hypertension   . Sleep apnea    has c-pap     Past Surgical History:  Procedure Laterality Date  . 2D Echocardiogram  03/27/11  . CATARACT EXTRACTION    . Neck skin tag biopsy    . TONSILLECTOMY AND ADENOIDECTOMY      Family History  Problem Relation Age of Onset  . Heart disease Father   . Emphysema Mother   . Allergies Mother    Social History:  reports that he quit smoking about 48 years ago. His smoking use included Cigarettes. He has a 5.00 pack-year smoking history. He has never used smokeless tobacco. He reports that he drinks about 4.2 oz of alcohol per week . He reports that he does not use drugs.  Allergies: No Known Allergies   (Not in a hospital admission)  No results found for this or any previous visit (from the past 48 hour(s)). No results found.  Review of Systems  Constitutional: Negative.   HENT: Negative.   Eyes: Negative.   Respiratory: Negative.   Cardiovascular: Negative.   Gastrointestinal: Negative.   Musculoskeletal: Negative.   Skin: Negative.   Neurological: Negative.   Psychiatric/Behavioral: Positive for memory loss. Negative for depression, hallucinations, substance abuse and suicidal ideas. The patient is not nervous/anxious and does not have insomnia.     Blood pressure (!) 159/79, pulse 63, temperature 97.6 F (36.4 C), temperature source Oral, resp. rate 16, SpO2 97 %. Physical Exam   Assessment/Plan Treatment today and then follow-up for a next treatment in 2 weeks.  Alethia Berthold, MD 01/28/2016, 10:25 AM

## 2016-01-28 NOTE — Anesthesia Preprocedure Evaluation (Signed)
Anesthesia Evaluation  Patient identified by MRN, date of birth, ID band Patient awake    Reviewed: Allergy & Precautions, H&P , NPO status , Patient's Chart, lab work & pertinent test results, reviewed documented beta blocker date and time   Airway Mallampati: II   Neck ROM: full    Dental  (+) Poor Dentition   Pulmonary neg pulmonary ROS, shortness of breath and with exertion, asthma , sleep apnea and Continuous Positive Airway Pressure Ventilation , COPD,  COPD inhaler, former smoker,    Pulmonary exam normal        Cardiovascular hypertension, Pt. on medications and Pt. on home beta blockers negative cardio ROS Normal cardiovascular exam Rhythm:regular Rate:Normal     Neuro/Psych PSYCHIATRIC DISORDERS  Neuromuscular disease negative neurological ROS  negative psych ROS   GI/Hepatic negative GI ROS, Neg liver ROS,   Endo/Other  negative endocrine ROSSIADH syndrome  Renal/GU negative Renal ROS  negative genitourinary   Musculoskeletal   Abdominal   Peds negative pediatric ROS (+)  Hematology  (+) anemia ,   Anesthesia Other Findings Past Medical History: No date: Anxiety No date: Asthma No date: Depression No date: Hyperlipemia No date: Hypertension No date: Sleep apnea     Comment: has c-pap  Past Surgical History: 03/27/11: 2D Echocardiogram No date: CATARACT EXTRACTION No date: Neck skin tag biopsy No date: TONSILLECTOMY AND ADENOIDECTOMY BMI    Body Mass Index:  23.24 kg/m     Reproductive/Obstetrics                             Anesthesia Physical  Anesthesia Plan  ASA: III  Anesthesia Plan: General   Post-op Pain Management:    Induction:   Airway Management Planned:   Additional Equipment:   Intra-op Plan:   Post-operative Plan:   Informed Consent: I have reviewed the patients History and Physical, chart, labs and discussed the procedure including the  risks, benefits and alternatives for the proposed anesthesia with the patient or authorized representative who has indicated his/her understanding and acceptance.   Dental Advisory Given  Plan Discussed with: CRNA  Anesthesia Plan Comments:         Anesthesia Quick Evaluation

## 2016-02-10 ENCOUNTER — Other Ambulatory Visit: Payer: Self-pay | Admitting: Psychiatry

## 2016-02-11 ENCOUNTER — Encounter: Payer: Self-pay | Admitting: Anesthesiology

## 2016-02-11 ENCOUNTER — Encounter (HOSPITAL_BASED_OUTPATIENT_CLINIC_OR_DEPARTMENT_OTHER)
Admission: RE | Admit: 2016-02-11 | Discharge: 2016-02-11 | Disposition: A | Discharge status: Home / Self Care | Payer: Medicare Other | Source: Ambulatory Visit | Attending: Psychiatry | Admitting: Psychiatry

## 2016-02-11 DIAGNOSIS — F332 Major depressive disorder, recurrent severe without psychotic features: Secondary | ICD-10-CM

## 2016-02-11 DIAGNOSIS — J45909 Unspecified asthma, uncomplicated: Secondary | ICD-10-CM | POA: Diagnosis not present

## 2016-02-11 DIAGNOSIS — F338 Other recurrent depressive disorders: Secondary | ICD-10-CM | POA: Diagnosis not present

## 2016-02-11 DIAGNOSIS — F329 Major depressive disorder, single episode, unspecified: Secondary | ICD-10-CM | POA: Diagnosis not present

## 2016-02-11 DIAGNOSIS — I10 Essential (primary) hypertension: Secondary | ICD-10-CM | POA: Diagnosis not present

## 2016-02-11 DIAGNOSIS — F419 Anxiety disorder, unspecified: Secondary | ICD-10-CM | POA: Diagnosis not present

## 2016-02-11 DIAGNOSIS — F418 Other specified anxiety disorders: Secondary | ICD-10-CM | POA: Diagnosis not present

## 2016-02-11 DIAGNOSIS — K5900 Constipation, unspecified: Secondary | ICD-10-CM | POA: Diagnosis not present

## 2016-02-11 DIAGNOSIS — G4730 Sleep apnea, unspecified: Secondary | ICD-10-CM | POA: Diagnosis not present

## 2016-02-11 DIAGNOSIS — E785 Hyperlipidemia, unspecified: Secondary | ICD-10-CM | POA: Diagnosis not present

## 2016-02-11 MED ORDER — KETOROLAC TROMETHAMINE 30 MG/ML IJ SOLN
30.0000 mg | Freq: Once | INTRAMUSCULAR | Status: AC
  Administered 2016-02-11: 30 mg via INTRAVENOUS

## 2016-02-11 MED ORDER — METHOHEXITAL SODIUM 100 MG/10ML IV SOSY
PREFILLED_SYRINGE | INTRAVENOUS | Status: DC | PRN
  Administered 2016-02-11: 80 mg via INTRAVENOUS

## 2016-02-11 MED ORDER — SODIUM CHLORIDE 0.9 % IV SOLN
INTRAVENOUS | Status: DC | PRN
  Administered 2016-02-11: 11:00:00 via INTRAVENOUS

## 2016-02-11 MED ORDER — SODIUM CHLORIDE 0.9 % IV SOLN
500.0000 mL | Freq: Once | INTRAVENOUS | Status: AC
  Administered 2016-02-11: 500 mL via INTRAVENOUS

## 2016-02-11 MED ORDER — SUCCINYLCHOLINE CHLORIDE 200 MG/10ML IV SOSY
PREFILLED_SYRINGE | INTRAVENOUS | Status: DC | PRN
  Administered 2016-02-11: 100 mg via INTRAVENOUS

## 2016-02-11 MED ORDER — ONDANSETRON HCL 4 MG/2ML IJ SOLN
INTRAMUSCULAR | Status: AC
  Administered 2016-02-11: 4 mg via INTRAVENOUS
  Filled 2016-02-11: qty 2

## 2016-02-11 MED ORDER — LABETALOL HCL 5 MG/ML IV SOLN
INTRAVENOUS | Status: DC | PRN
  Administered 2016-02-11: 20 mg via INTRAVENOUS

## 2016-02-11 MED ORDER — ONDANSETRON HCL 4 MG/2ML IJ SOLN
4.0000 mg | Freq: Once | INTRAMUSCULAR | Status: AC
  Administered 2016-02-11: 4 mg via INTRAVENOUS

## 2016-02-11 MED ORDER — KETOROLAC TROMETHAMINE 30 MG/ML IJ SOLN
INTRAMUSCULAR | Status: AC
  Administered 2016-02-11: 30 mg via INTRAVENOUS
  Filled 2016-02-11: qty 1

## 2016-02-11 NOTE — Procedures (Signed)
ECT SERVICES Physician's Interval Evaluation & Treatment Note  Patient Identification: Matthew Patrick MRN:  [id number] Date of Evaluation:  02/11/2016 TX #: 17  MADRS:   MMSE:   P.E. Findings:  No change to physical exam. Blood pressure however is slightly up. Lungs and heart clear.  Psychiatric Interval Note:  Mood is feeling a little bit more down. Not necessarily nervous but is not really motivated or feeling active.  Subjective:  Patient is a 70 y.o. male seen for evaluation for Electroconvulsive Therapy. Energy level is low  Treatment Summary:   []   Right Unilateral             [x]  Bilateral   % Energy : 1.0 ms 70%   Impedance: 1000 ohms  Seizure Energy Index: 4950 V squared  Postictal Suppression Index: 73%  Seizure Concordance Index: 93%  Medications  Pre Shock: Labetalol 20 mg, Toradol 30 mg, Zofran 4 mg, Brevital 70 mg, succinylcholine 80 mg  Post Shock:    Seizure Duration: 35 seconds by EMG, 52 seconds by EEG   Comments: Follow back in 2 weeks. He has been encouraged to be more active over the weekend   Lungs:  [x]   Clear to auscultation               []  Other:   Heart:    [x]   Regular rhythm             []  irregular rhythm    [x]   Previous H&P reviewed, patient examined and there are NO CHANGES                 []   Previous H&P reviewed, patient examined and there are changes noted.   Alethia Berthold, MD 10/20/201710:37 AM

## 2016-02-11 NOTE — Transfer of Care (Signed)
Immediate Anesthesia Transfer of Care Note  Patient: Matthew Patrick  Procedure(s) Performed: * No procedures listed *  Patient Location: PACU  Anesthesia Type:General  Level of Consciousness: patient cooperative and lethargic  Airway & Oxygen Therapy: Patient Spontanous Breathing and Patient connected to face mask oxygen  Post-op Assessment: Report given to RN and Post -op Vital signs reviewed and stable  Post vital signs: Reviewed and stable  Last Vitals:  Vitals:   02/11/16 1055 02/11/16 1056  BP: (!) 174/86 (!) 174/86  Pulse: 70 71  Resp: 12 12  Temp: 36.3 C 36.7 C    Last Pain:  Vitals:   02/11/16 1056  TempSrc: Tympanic  PainSc:       Patients Stated Pain Goal: 0 ([id number])  Complications: No apparent anesthesia complications

## 2016-02-11 NOTE — Anesthesia Preprocedure Evaluation (Signed)
Anesthesia Evaluation  Patient identified by MRN, date of birth, ID band Patient awake    Reviewed: Allergy & Precautions, H&P , NPO status , Patient's Chart, lab work & pertinent test results, reviewed documented beta blocker date and time   History of Anesthesia Complications Negative for: history of anesthetic complications  Airway Mallampati: III  TM Distance: <3 FB Neck ROM: limited    Dental  (+) Poor Dentition, Chipped   Pulmonary shortness of breath and with exertion, asthma , sleep apnea and Continuous Positive Airway Pressure Ventilation , COPD,  COPD inhaler, former smoker,    Pulmonary exam normal        Cardiovascular hypertension, Pt. on medications and Pt. on home beta blockers Normal cardiovascular exam Rhythm:regular Rate:Normal     Neuro/Psych PSYCHIATRIC DISORDERS Anxiety Depression  Neuromuscular disease    GI/Hepatic negative GI ROS, Neg liver ROS,   Endo/Other  negative endocrine ROSSIADH syndrome  Renal/GU negative Renal ROS  negative genitourinary   Musculoskeletal   Abdominal   Peds negative pediatric ROS (+)  Hematology  (+) anemia ,   Anesthesia Other Findings Past Medical History: No date: Anxiety No date: Asthma No date: Depression No date: Hyperlipemia No date: Hypertension No date: Sleep apnea     Comment: has c-pap  Past Surgical History: 03/27/11: 2D Echocardiogram No date: CATARACT EXTRACTION No date: Neck skin tag biopsy No date: TONSILLECTOMY AND ADENOIDECTOMY BMI    Body Mass Index:  23.24 kg/m     Reproductive/Obstetrics                             Anesthesia Physical  Anesthesia Plan  ASA: III  Anesthesia Plan: General   Post-op Pain Management:    Induction:   Airway Management Planned:   Additional Equipment:   Intra-op Plan:   Post-operative Plan:   Informed Consent: I have reviewed the patients History and Physical,  chart, labs and discussed the procedure including the risks, benefits and alternatives for the proposed anesthesia with the patient or authorized representative who has indicated his/her understanding and acceptance.   Dental Advisory Given  Plan Discussed with: CRNA  Anesthesia Plan Comments:         Anesthesia Quick Evaluation

## 2016-02-11 NOTE — H&P (Signed)
Matthew Patrick is an 71 y.o. male.   Chief Complaint: A little bit of nausea and a little bit of constipation. Not feeling very motivated or interested in things. Anxiety doesn't seem to be nearly as bad. We talked about HPI: Patient with recurrent severe depression who has responded to ECT. Today looks a little bit more flat than he did last time. We are trying to go with a two-week schedule to prevent things from getting worse. He's been complaining of some GI side effects recently which do not sound like they are probably related to his ECT.  Past Medical History:  Diagnosis Date  . Anxiety   . Asthma   . Depression   . Hyperlipemia   . Hypertension   . Sleep apnea    has c-pap     Past Surgical History:  Procedure Laterality Date  . 2D Echocardiogram  03/27/11  . CATARACT EXTRACTION    . Neck skin tag biopsy    . TONSILLECTOMY AND ADENOIDECTOMY      Family History  Problem Relation Age of Onset  . Heart disease Father   . Emphysema Mother   . Allergies Mother    Social History:  reports that he quit smoking about 48 years ago. His smoking use included Cigarettes. He has a 5.00 pack-year smoking history. He has never used smokeless tobacco. He reports that he drinks about 4.2 oz of alcohol per week . He reports that he does not use drugs.  Allergies: No Known Allergies   (Not in a hospital admission)  No results found for this or any previous visit (from the past 48 hour(s)). No results found.  Review of Systems  Constitutional: Positive for malaise/fatigue.  HENT: Negative.   Eyes: Negative.   Respiratory: Negative.   Cardiovascular: Negative.   Gastrointestinal: Positive for constipation and nausea.  Musculoskeletal: Negative.   Skin: Negative.   Neurological: Negative.   Psychiatric/Behavioral: Negative for depression, hallucinations, memory loss, substance abuse and suicidal ideas. The patient is not nervous/anxious and does not have insomnia.     Blood pressure  (!) 164/71, pulse (!) 58, temperature 97.3 F (36.3 C), temperature source Oral, resp. rate 17, height 5\' 10"  (1.778 m), weight 73.5 kg (162 lb), SpO2 98 %. Physical Exam  Nursing note and vitals reviewed. Constitutional: He appears well-developed and well-nourished.  HENT:  Head: Normocephalic and atraumatic.  Eyes: Conjunctivae are normal. Pupils are equal, round, and reactive to light.  Neck: Normal range of motion.  Cardiovascular: Regular rhythm and normal heart sounds.   Respiratory: Effort normal and breath sounds normal. No respiratory distress.  GI: Soft.  Musculoskeletal: Normal range of motion.  Neurological: He is alert.  Skin: Skin is warm and dry.  Psychiatric: His speech is normal and behavior is normal. Judgment and thought content normal. His affect is blunt. Cognition and memory are normal.     Assessment/Plan We talked about how he needs to make an effort to be more active and do things around the house especially over the weekend. Suggested that he use safe and natural methods to help with constipation. Follow-up in another couple weeks.  Alethia Berthold, MD 02/11/2016, 9:45 AM

## 2016-02-11 NOTE — Discharge Instructions (Addendum)
1)  The drugs that you have been given will stay in your system until tomorrow so for the       next 24 hours you should not:  A. Drive an automobile  B. Make any legal decisions  C. Drink any alcoholic beverages  2)  You may resume your regular meals upon return home.  3)  A responsible adult must take you home.  Someone should stay with you for a few          hours, then be available by phone for the remainder of the treatment day.  4)  You May experience any of the following symptoms:  Headache, Nausea and a dry mouth (due to the medications you were given),  temporary memory loss and some confusion, or sore muscles (a warm bath  should help this).  If you you experience any of these symptoms let us know on                your return visit.  5)  Report any of the following: any acute discomfort, severe headache, or temperature        greater than 100.5 F.   Also report any unusual redness, swelling, drainage, or pain         at your IV site.    You may report Symptoms to:  [place] at [facility]          Phone: [phone], ECT Department           or Dr. Prescott Gum office [phone]  6)  Your next ECT Treatment is Friday Date Nov 3.  We will call 2 days prior to your scheduled appointment for arrival times.  7)  Nothing to eat or drink after midnight the night before your procedure.  8)  Take ...    With a sip of water the morning of your procedure.  9)  Other Instructions: Call [phone] to cancel the morning of your procedure due         to illness or emergency.  10) We will call within 72 hours to assess how you are feeling.

## 2016-02-12 NOTE — Anesthesia Postprocedure Evaluation (Signed)
Anesthesia Post Note  Patient: Matthew Patrick  Procedure(s) Performed: * No procedures listed *  Patient location during evaluation: PACU Anesthesia Type: General Level of consciousness: awake and alert Pain management: pain level controlled Vital Signs Assessment: post-procedure vital signs reviewed and stable Respiratory status: spontaneous breathing, nonlabored ventilation, respiratory function stable and patient connected to nasal cannula oxygen Cardiovascular status: blood pressure returned to baseline and stable Postop Assessment: no signs of nausea or vomiting Anesthetic complications: no    Last Vitals:  Vitals:   02/11/16 1122 02/11/16 1131  BP: (!) 145/84 (!) 142/72  Pulse: 74 60  Resp: 17 16  Temp:      Last Pain:  Vitals:   02/11/16 1056  TempSrc: Tympanic  PainSc:                  Precious Haws Adrieanna Boteler

## 2016-02-23 ENCOUNTER — Telehealth: Payer: Self-pay | Admitting: *Deleted

## 2016-02-25 ENCOUNTER — Telehealth: Payer: Self-pay

## 2016-03-07 ENCOUNTER — Other Ambulatory Visit (HOSPITAL_COMMUNITY): Payer: Medicare Other | Attending: Psychiatry | Admitting: Psychiatry

## 2016-03-07 ENCOUNTER — Encounter (HOSPITAL_COMMUNITY): Payer: Self-pay

## 2016-03-07 DIAGNOSIS — F332 Major depressive disorder, recurrent severe without psychotic features: Secondary | ICD-10-CM | POA: Diagnosis not present

## 2016-03-07 NOTE — Progress Notes (Signed)
Psychiatric Initial Adult Assessment   Patient Identification: Matthew Patrick MRN:  [id number] Date of Evaluation:  03/07/2016 Referral Source: Dr [facility] Chief Complaint:depression not responding to medication, therapy or E CT   Visit Diagnosis: major depression, recurrent severe without psychosis  History of Present Illness:  [name] says he has been depressed all his adult life.  His father was an alcoholic and depressed and his mother had some depression as well.  Both his daughters have depression.  He currently has no energy, interest or focus, feels hopeless, has crying spells, has suicidal thoughts but no plan or intent, decreased appetite, very poor sleep, absolutely no pleasure in usual activities, feeling guilty and anxiety.  He has tried all antidepressants and augmentation strategies.  Only Prozac worked temporarily.  ECT helped some but not enough to continue the therapy with how he felt from the treatment itself.   He has no history of seizures, no psychosis or bipolar disorder, no metal implants.  Associated Signs/Symptoms: Depression Symptoms:  depressed mood, anhedonia, insomnia, fatigue, feelings of worthlessness/guilt, difficulty concentrating, hopelessness, impaired memory, suicidal thoughts without plan, anxiety, decreased labido, decreased appetite, (Hypo) Manic Symptoms:  Irritable Mood, Anxiety Symptoms:  Excessive Worry, Psychotic Symptoms:  none PTSD Symptoms: Negative  Past Psychiatric History: inpatient earlier this year followed by ECT.  Long history of outpatient medication management and therapy.  Previous Psychotropic Medications: Yes   Substance Abuse History in the last 12 months:  No.  Consequences of Substance Abuse: Negative  Past Medical History:  Past Medical History:  Diagnosis Date  . Anxiety   . Asthma   . Depression   . Hyperlipemia   . Hypertension   . Sleep apnea    has c-pap     Past Surgical History:  Procedure Laterality  Date  . 2D Echocardiogram  03/27/11  . CATARACT EXTRACTION    . Neck skin tag biopsy    . TONSILLECTOMY AND ADENOIDECTOMY      Family Psychiatric History: depression on both sides and father alcoholic  Family History:  Family History  Problem Relation Age of Onset  . Heart disease Father   . Emphysema Mother   . Allergies Mother     Social History:   Social History   Social History  . Marital status: Significant Other    Spouse name: N/A  . Number of children: N/A  . Years of education: N/A   Occupational History  . retired    Social History Main Topics  . Smoking status: Former Smoker    Packs/day: 1.00    Years: 5.00    Types: Cigarettes    Quit date: 04/25/1967  . Smokeless tobacco: Never Used  . Alcohol use 4.2 oz/week    7 Glasses of wine per week     Comment: 2-4 glasses of wine per night, none in 3 weeks  . Drug use: No  . Sexual activity: No   Other Topics Concern  . Not on file   Social History Narrative  . No narrative on file    Additional Social History: current marriage is good for him  Allergies:  No Known Allergies  Metabolic Disorder Labs: Lab Results  Component Value Date   HGBA1C 5.7 09/17/2015   MPG 117 (H) 03/26/2011   Lab Results  Component Value Date   PROLACTIN 18.6 (H) 09/17/2015   Lab Results  Component Value Date   CHOL 173 09/17/2015   TRIG 140 09/17/2015   HDL 58 09/17/2015   CHOLHDL  3.0 09/17/2015   VLDL 28 09/17/2015   LDLCALC 87 09/17/2015   LDLCALC 43 03/26/2011   LDLCALC 46 03/26/2011     Current Medications: Current Outpatient Prescriptions  Medication Sig Dispense Refill  . amantadine (SYMMETREL) 100 MG capsule Take 100 mg by mouth 2 (two) times daily.    Marland Kitchen aspirin 81 MG tablet Take 81 mg by mouth daily.    . B Complex-C (B-COMPLEX WITH VITAMIN C) tablet Take 1 tablet by mouth daily.    Marland Kitchen doxepin (SINEQUAN) 50 MG capsule Take 50 mg by mouth.    . feeding supplement (BOOST / RESOURCE BREEZE) LIQD Take 1  Container by mouth 2 (two) times daily between meals. 60 Container 5  . fluticasone (FLONASE) 50 MCG/ACT nasal spray Place 2 sprays into both nostrils daily.    Marland Kitchen gabapentin (NEURONTIN) 300 MG capsule Take 300 mg by mouth at bedtime.    Marland Kitchen LORazepam (ATIVAN) 0.5 MG tablet Take 0.5 tablets (0.25 mg total) by mouth 4 (four) times daily. 30 tablet 0  . QUEtiapine (SEROQUEL) 100 MG tablet Take 150 mg by mouth at bedtime. Reported on 10/11/2015    . QUEtiapine (SEROQUEL) 50 MG tablet Take 3 tablets (150 mg total) by mouth at bedtime. 90 tablet 0  . vortioxetine HBr (TRINTELLIX) 20 MG TABS Take 20 mg by mouth daily.     No current facility-administered medications for this visit.     Neurologic: Headache: Negative Seizure: Negative Paresthesias:Negative  Musculoskeletal: Strength & Muscle Tone: within normal limits Gait & Station: normal Patient leans: N/A  Psychiatric Specialty Exam: ROS  There were no vitals taken for this visit.There is no height or weight on file to calculate BMI.  General Appearance: Well Groomed  Eye Contact:  Good  Speech:  Clear and Coherent  Volume:  Normal  Mood:  Depressed  Affect:  Congruent  Thought Process:  Coherent  Orientation:  Full (Time, Place, and Person)  Thought Content:  Logical  Suicidal Thoughts:  No  Homicidal Thoughts:  No  Memory:  Immediate;   Good Recent;   Good Remote;   Good  Judgement:  Good  Insight:  Good  Psychomotor Activity:  Normal  Concentration:  Concentration: Good and Attention Span: Good  Recall:  Good  Fund of Knowledge:Good  Language: Good  Akathisia:  Negative  Handed:  Right  AIMS (if indicated):  0  Assets:  Communication Skills Desire for Improvement Financial Resources/Insurance [place] Talents/Skills Transportation Vocational/Educational  ADL's:  Intact  Cognition: WNL  Sleep:  poor    Treatment Plan Summary: Qualifies for [facility] and wants to  pursue the treatment   Donnelly Angelica, MD 11/14/20172:27 PM

## 2016-03-23 DIAGNOSIS — K589 Irritable bowel syndrome without diarrhea: Secondary | ICD-10-CM | POA: Diagnosis not present

## 2016-03-23 DIAGNOSIS — Z6823 Body mass index (BMI) 23.0-23.9, adult: Secondary | ICD-10-CM | POA: Diagnosis not present

## 2016-03-23 DIAGNOSIS — F329 Major depressive disorder, single episode, unspecified: Secondary | ICD-10-CM | POA: Diagnosis not present

## 2016-03-23 DIAGNOSIS — R5381 Other malaise: Secondary | ICD-10-CM | POA: Diagnosis not present

## 2016-03-23 DIAGNOSIS — I10 Essential (primary) hypertension: Secondary | ICD-10-CM | POA: Diagnosis not present

## 2016-03-23 DIAGNOSIS — Z79899 Other long term (current) drug therapy: Secondary | ICD-10-CM | POA: Diagnosis not present

## 2016-04-03 DIAGNOSIS — I10 Essential (primary) hypertension: Secondary | ICD-10-CM | POA: Diagnosis not present

## 2016-04-10 ENCOUNTER — Emergency Department (HOSPITAL_COMMUNITY): Payer: Medicare Other

## 2016-04-10 ENCOUNTER — Encounter (HOSPITAL_COMMUNITY): Payer: Self-pay | Admitting: Emergency Medicine

## 2016-04-10 ENCOUNTER — Emergency Department (HOSPITAL_COMMUNITY)
Admission: EM | Admit: 2016-04-10 | Discharge: 2016-04-10 | Disposition: A | Discharge status: Home / Self Care | Payer: Medicare Other | Attending: Emergency Medicine | Admitting: Emergency Medicine

## 2016-04-10 DIAGNOSIS — W11XXXA Fall on and from ladder, initial encounter: Secondary | ICD-10-CM | POA: Insufficient documentation

## 2016-04-10 DIAGNOSIS — Z87891 Personal history of nicotine dependence: Secondary | ICD-10-CM | POA: Insufficient documentation

## 2016-04-10 DIAGNOSIS — S22088A Other fracture of T11-T12 vertebra, initial encounter for closed fracture: Secondary | ICD-10-CM | POA: Insufficient documentation

## 2016-04-10 DIAGNOSIS — Y9389 Activity, other specified: Secondary | ICD-10-CM | POA: Diagnosis not present

## 2016-04-10 DIAGNOSIS — S161XXA Strain of muscle, fascia and tendon at neck level, initial encounter: Secondary | ICD-10-CM | POA: Diagnosis not present

## 2016-04-10 DIAGNOSIS — S299XXA Unspecified injury of thorax, initial encounter: Secondary | ICD-10-CM | POA: Diagnosis present

## 2016-04-10 DIAGNOSIS — Y999 Unspecified external cause status: Secondary | ICD-10-CM | POA: Diagnosis not present

## 2016-04-10 DIAGNOSIS — S22089A Unspecified fracture of T11-T12 vertebra, initial encounter for closed fracture: Secondary | ICD-10-CM | POA: Diagnosis not present

## 2016-04-10 DIAGNOSIS — M545 Low back pain: Secondary | ICD-10-CM | POA: Diagnosis not present

## 2016-04-10 DIAGNOSIS — S0990XA Unspecified injury of head, initial encounter: Secondary | ICD-10-CM

## 2016-04-10 DIAGNOSIS — S3992XA Unspecified injury of lower back, initial encounter: Secondary | ICD-10-CM | POA: Diagnosis not present

## 2016-04-10 DIAGNOSIS — M25512 Pain in left shoulder: Secondary | ICD-10-CM | POA: Diagnosis not present

## 2016-04-10 DIAGNOSIS — Y92009 Unspecified place in unspecified non-institutional (private) residence as the place of occurrence of the external cause: Secondary | ICD-10-CM | POA: Diagnosis not present

## 2016-04-10 DIAGNOSIS — W19XXXA Unspecified fall, initial encounter: Secondary | ICD-10-CM

## 2016-04-10 DIAGNOSIS — R079 Chest pain, unspecified: Secondary | ICD-10-CM | POA: Diagnosis not present

## 2016-04-10 DIAGNOSIS — T148XXA Other injury of unspecified body region, initial encounter: Secondary | ICD-10-CM | POA: Diagnosis not present

## 2016-04-10 DIAGNOSIS — S4992XA Unspecified injury of left shoulder and upper arm, initial encounter: Secondary | ICD-10-CM | POA: Diagnosis not present

## 2016-04-10 DIAGNOSIS — S22000A Wedge compression fracture of unspecified thoracic vertebra, initial encounter for closed fracture: Secondary | ICD-10-CM | POA: Diagnosis not present

## 2016-04-10 DIAGNOSIS — S79912A Unspecified injury of left hip, initial encounter: Secondary | ICD-10-CM | POA: Diagnosis not present

## 2016-04-10 DIAGNOSIS — R0789 Other chest pain: Secondary | ICD-10-CM | POA: Diagnosis not present

## 2016-04-10 DIAGNOSIS — I10 Essential (primary) hypertension: Secondary | ICD-10-CM | POA: Insufficient documentation

## 2016-04-10 DIAGNOSIS — S199XXA Unspecified injury of neck, initial encounter: Secondary | ICD-10-CM | POA: Diagnosis not present

## 2016-04-10 DIAGNOSIS — S22080A Wedge compression fracture of T11-T12 vertebra, initial encounter for closed fracture: Secondary | ICD-10-CM

## 2016-04-10 DIAGNOSIS — J449 Chronic obstructive pulmonary disease, unspecified: Secondary | ICD-10-CM | POA: Insufficient documentation

## 2016-04-10 LAB — I-STAT CHEM 8, ED
BUN: 14 mg/dL (ref 6–20)
CALCIUM ION: 1.22 mmol/L (ref 1.15–1.40)
CHLORIDE: 95 mmol/L — AB (ref 101–111)
Creatinine, Ser: 0.9 mg/dL (ref 0.61–1.24)
Glucose, Bld: 101 mg/dL — ABNORMAL HIGH (ref 65–99)
HCT: 37 % — ABNORMAL LOW (ref 39.0–52.0)
HEMOGLOBIN: 12.6 g/dL — AB (ref 13.0–17.0)
Potassium: 4.2 mmol/L (ref 3.5–5.1)
SODIUM: 132 mmol/L — AB (ref 135–145)
TCO2: 28 mmol/L (ref 0–100)

## 2016-04-10 LAB — I-STAT TROPONIN, ED: Troponin i, poc: 0 ng/mL (ref 0.00–0.08)

## 2016-04-10 MED ORDER — TRAMADOL HCL 50 MG PO TABS: 50.0000 mg | ORAL_TABLET | Freq: Four times a day (QID) | ORAL | 0 refills | Status: DC | PRN

## 2016-04-10 MED ORDER — TRAMADOL HCL 50 MG PO TABS
50.0000 mg | ORAL_TABLET | Freq: Once | ORAL | Status: AC
  Administered 2016-04-10: 50 mg via ORAL
  Filled 2016-04-10: qty 1

## 2016-04-10 NOTE — Discharge Instructions (Signed)
We believe that your symptoms are caused by musculoskeletal strain.  Please read through the included information about additional care such as heating pads, over-the-counter pain medicine.  If you were provided a prescription please use it only as needed and as instructed.  Remember that early mobility and using the affected part of your body is actually better than keeping it immobile.  We did find a small T12 compression fracture of the spine. We have provided some pain medication to take as needed for severe pain that is not improved with Tylenol and/or Motrin. Call your Neurosurgery doctor for an office appointment in 1-2 weeks.   Follow-up with the doctor listed as recommended or return to the emergency department with new or worsening symptoms that concern you.

## 2016-04-10 NOTE — ED Provider Notes (Signed)
Emergency Department Provider Note   I have reviewed the triage vital signs and the nursing notes.   HISTORY  Chief Complaint Fall   HPI Matthew Patrick is a 70 y.o. male with PMH of asthma, depression, HLD, and HTN presents to the emergency department for evaluation after fall. The patient was changing air filter in his house when he came lightheaded and fell off the ladder. He denies any vertigo. The patient landed on his head and left side. He fell down the stairs to the bottom of 2 flights. No obvious loss of consciousness. No prior syncope events. Denies any chest pain or difficulty breathing prior to falling. Patient's wife states he's been on medications for depression and has been feeling intermittently lightheaded recently. No fever or chills.   Past Medical History:  Diagnosis Date  . Anxiety   . Asthma   . Depression   . Hyperlipemia   . Hypertension   . Sleep apnea    has c-pap     Patient Active Problem List   Diagnosis Date Noted  . Generalized weakness 09/22/2015  . Depression 09/22/2015  . Anemia 09/22/2015  . Obsessive compulsive disorder 09/22/2015  . Generalized anxiety disorder 09/22/2015  . SIADH (syndrome of inappropriate ADH production) ([place]) 09/21/2015  . Hyponatremia 09/20/2015  . Severe recurrent major depression with psychotic features ([place]) 09/19/2015  . Severe recurrent major depression without psychotic features ([place]) 09/17/2015  . Suicidal ideation 09/17/2015  . Cough 01/19/2015  . Restless leg 01/19/2015  . COLD (chronic obstructive lung disease) ([place]) 05/22/2014  . Lung nodule < 6cm on CT 05/22/2014  . OSA (obstructive sleep apnea) 07/11/2013  . Lung nodule 05/11/2013  . Nocturnal hypoxemia 05/11/2013  . Obstructive lung disease ([place]) 04/17/2013  . Chronic cough 04/17/2013  . Syncope 03/25/2011  . Hypertension 03/25/2011  . Hypokalemia 03/25/2011  . Asthma 03/25/2011  . Sciatica 03/25/2011  . SOB (shortness of breath) 03/25/2011     Past Surgical History:  Procedure Laterality Date  . 2D Echocardiogram  03/27/11  . CATARACT EXTRACTION    . Neck skin tag biopsy    . TONSILLECTOMY AND ADENOIDECTOMY      Current Outpatient Rx  . Order #: [id number] Class: Historical Med  . Order #: [id number] Class: Historical Med  . Order #: [id number] Class: Historical Med  . Order #: [id number] Class: Historical Med  . Order #: [id number] Class: Normal  . Order #: [id number] Class: Historical Med  . Order #: [id number] Class: Historical Med  . Order #: [id number] Class: Print  . Order #: [id number] Class: Historical Med  . Order #: [id number] Class: Print  . Order #: [id number] Class: Print  . Order #: [id number] Class: Historical Med    Allergies Patient has no known allergies.  Family History  Problem Relation Age of Onset  . Heart disease Father   . Emphysema Mother   . Allergies Mother     Social History Social History  Substance Use Topics  . Smoking status: Former Smoker    Packs/day: 1.00    Years: 5.00    Types: Cigarettes    Quit date: 04/25/1967  . Smokeless tobacco: Never Used  . Alcohol use 4.2 oz/week    7 Glasses of wine per week     Comment: 2-4 glasses of wine per night, none in 3 weeks    Review of Systems  Constitutional: No fever/chills Eyes: No visual changes. ENT: No sore throat. Cardiovascular: Denies chest pain. Respiratory: Denies shortness of breath. Gastrointestinal: No abdominal pain.  No nausea, no vomiting.  No diarrhea.  No constipation. Genitourinary: Negative for dysuria. Musculoskeletal: Positive back pain, head trauma, and left hip/shoulder/chest wall pain.  Skin: Negative for rash. Neurological: Negative for headaches, focal weakness or numbness.  10-point ROS otherwise negative.  ____________________________________________   PHYSICAL EXAM:  VITAL SIGNS: Resp: 18 SpO2: 98% Pulse: 88 BP: 166/85  Constitutional: Alert and oriented. Well appearing and in no acute  distress. Eyes: Conjunctivae are normal.  Head: Abrasion to left forehead. No laceration.  Nose: No congestion/rhinnorhea. Mouth/Throat: Mucous membranes are moist.  Oropharynx non-erythematous. Mild left lateral face swelling.  Neck: No stridor.  No cervical spine tenderness to palpation. Cardiovascular: Normal rate, regular rhythm. Good peripheral circulation. Grossly normal heart sounds.   Respiratory: Normal respiratory effort.  No retractions. Lungs CTAB. Gastrointestinal: Soft and nontender. No distention.  Musculoskeletal: No lower extremity tenderness nor edema. No gross deformities of extremities. Left lateral chest wall. No deformity or bruising. Positive midline lower thoracic and upper lumbar tenderness.  Neurologic:  Normal speech and language. No gross focal neurologic deficits are appreciated.  Skin:  Skin is warm, dry and intact. No rash noted.  ____________________________________________   LABS (all labs ordered are listed, but only abnormal results are displayed)  Labs Reviewed  I-STAT CHEM 8, ED - Abnormal; Notable for the following:       Result Value   Sodium 132 (*)    Chloride 95 (*)    Glucose, Bld 101 (*)    Hemoglobin 12.6 (*)    HCT 37.0 (*)    All other components within normal limits  I-STAT TROPOININ, ED   ____________________________________________  EKG   EKG Interpretation  Date/Time:  Monday April 10 2016 14:24:08 EST Ventricular Rate:  89 PR Interval:    QRS Duration: 82 QT Interval:  358 QTC Calculation: 436 R Axis:   21 Text Interpretation:  Sinus rhythm Minimal ST elevation, anterior leads No STEMI.  Confirmed by Kery Haltiwanger MD, Malaysha Arlen [id number]) on 04/10/2016 2:36:04 PM       ____________________________________________  RADIOLOGY  Dg Chest 2 View  Result Date: 04/10/2016 CLINICAL DATA:  Fall from ladder with subsequent fall down a flight of stairs. Pain in left shoulder, down left side and into left hip. C/o left anterior chest  pain; concern for rib injury in lower left ribs Hx: Asthma, occasional htn, none smoker. Pt unable to lay on left side for left lateral spine images; right lateral images obtained EXAM: CHEST - 2 VIEW COMPARISON:  CT 12/31/2014 and earlier studies FINDINGS: Chronic linear scarring or subsegmental atelectasis in the posterior right lower lobe and laterally at the left lung base. No focal infiltrate or overt edema. Heart size and mediastinal contours are within normal limits. No pneumothorax.  No effusion. Visualized bones unremarkable. 4.2 cm peripherally calcified left renal mass. IMPRESSION: 1. No acute cardiopulmonary disease. 2. 4.2 cm peripherally calcified left renal mass, present on studies dating back to 03/25/2011 suggesting an indolent process. When the patient is clinically stable and able to follow directions and hold their breath (preferably as an outpatient) further evaluation with dedicated abdominal MRI may be useful for further characterization. Electronically Signed   By: Lucrezia Europe M.D.   On: 04/10/2016 15:22   Dg Thoracic Spine 2 View  Result Date: 04/10/2016 CLINICAL DATA:  Fall from ladder and down flight of stairs. Back pain. Initial encounter. EXAM: THORACIC SPINE 2 VIEWS COMPARISON:  03/22/2015 spine radiographs.  12/31/2014 chest CT. FINDINGS: Upper thoracic levoconvex curvature is similar to the prior CT.  There is unchanged straightening of the thoracic kyphosis. No subluxation is identified. T12 compression fracture is more fully evaluated on separate lumbar spine radiographs. Vertebral body heights are preserved elsewhere. Cervical spine disc degeneration is incompletely evaluated. Coarsely calcified lesion in the left upper abdomen corresponds to a renal lesion on prior CT. IMPRESSION: T12 compression fracture.  See separate lumbar spine report. Electronically Signed   By: Logan Bores M.D.   On: 04/10/2016 15:23   Dg Lumbar Spine 2-3 Views  Result Date: 04/10/2016 CLINICAL  DATA:  Fall from ladder and down flight of stairs. Back pain. Initial encounter. EXAM: LUMBAR SPINE - 2-3 VIEW COMPARISON:  03/22/2015 spine radiographs.  12/31/2014 chest CT. FINDINGS: There are 5 non rib-bearing lumbar type vertebrae. There is a T12 superior endplate compression fracture with mild vertebral body height loss which is new. Mild L2 and moderate L3 superior endplate compression fractures are similar to the prior radiographs. Grade 1 anterolisthesis of L4 on L5 has not significantly changed. There is mild disc space narrowing at L1-2, L2-3, and L4-5. Aortic atherosclerosis and a calcified left renal mass are noted (partially visualized on prior CT). IMPRESSION: 1. New T12 compression fracture. 2. Chronic L2 and L3 compression fractures. Electronically Signed   By: Logan Bores M.D.   On: 04/10/2016 15:26   Ct Head Wo Contrast  Result Date: 04/10/2016 CLINICAL DATA:  Fall from ladder and then down a flight of steps. EXAM: CT HEAD WITHOUT CONTRAST CT CERVICAL SPINE WITHOUT CONTRAST TECHNIQUE: Multidetector CT imaging of the head and cervical spine was performed following the standard protocol without intravenous contrast. Multiplanar CT image reconstructions of the cervical spine were also generated. COMPARISON:  Head CT 03/25/2011 FINDINGS: CT HEAD FINDINGS Brain: There is no evidence for acute hemorrhage, hydrocephalus, mass lesion, or abnormal extra-axial fluid collection. No definite CT evidence for acute infarction. Diffuse loss of parenchymal volume is consistent with atrophy. Patchy low attenuation in the deep hemispheric and periventricular white matter is nonspecific, but likely reflects chronic microvascular ischemic demyelination. Vascular: No hyperdense vessel or unexpected calcification. Skull: No evidence for fracture. No worrisome lytic or sclerotic lesion. Sinuses/Orbits: The visualized paranasal sinuses and mastoid air cells are clear. Visualized portions of the globes and  intraorbital fat are unremarkable. Other: None. CT CERVICAL SPINE FINDINGS Alignment: Accentuated lordosis Skull base and vertebrae: No evidence of fracture from the skull base to the T1 vertebral body. Soft tissues and spinal canal: No prevertebral fluid or swelling. No visible canal hematoma. Disc levels: Loss of disc height is seen at C4-5 and C6-7 with endplate degeneration. Left facets are fused at the C4-5 level with facet degeneration seen on the left at C5-6. Upper chest: Unremarkable. Other: None. IMPRESSION: 1. No acute intracranial abnormality. 2. Atrophy with chronic small vessel white matter ischemic disease. 3. Degenerative changes in the cervical spine without fracture. Electronically Signed   By: Misty Stanley M.D.   On: 04/10/2016 16:41   Ct Cervical Spine Wo Contrast  Result Date: 04/10/2016 CLINICAL DATA:  Fall from ladder and then down a flight of steps. EXAM: CT HEAD WITHOUT CONTRAST CT CERVICAL SPINE WITHOUT CONTRAST TECHNIQUE: Multidetector CT imaging of the head and cervical spine was performed following the standard protocol without intravenous contrast. Multiplanar CT image reconstructions of the cervical spine were also generated. COMPARISON:  Head CT 03/25/2011 FINDINGS: CT HEAD FINDINGS Brain: There is no evidence for acute hemorrhage, hydrocephalus, mass lesion, or abnormal extra-axial fluid collection. No definite CT evidence for acute infarction.  Diffuse loss of parenchymal volume is consistent with atrophy. Patchy low attenuation in the deep hemispheric and periventricular white matter is nonspecific, but likely reflects chronic microvascular ischemic demyelination. Vascular: No hyperdense vessel or unexpected calcification. Skull: No evidence for fracture. No worrisome lytic or sclerotic lesion. Sinuses/Orbits: The visualized paranasal sinuses and mastoid air cells are clear. Visualized portions of the globes and intraorbital fat are unremarkable. Other: None. CT CERVICAL SPINE  FINDINGS Alignment: Accentuated lordosis Skull base and vertebrae: No evidence of fracture from the skull base to the T1 vertebral body. Soft tissues and spinal canal: No prevertebral fluid or swelling. No visible canal hematoma. Disc levels: Loss of disc height is seen at C4-5 and C6-7 with endplate degeneration. Left facets are fused at the C4-5 level with facet degeneration seen on the left at C5-6. Upper chest: Unremarkable. Other: None. IMPRESSION: 1. No acute intracranial abnormality. 2. Atrophy with chronic small vessel white matter ischemic disease. 3. Degenerative changes in the cervical spine without fracture. Electronically Signed   By: Misty Stanley M.D.   On: 04/10/2016 16:41   Dg Shoulder Left  Result Date: 04/10/2016 CLINICAL DATA:  Fall from ladder.  Subsequent fall down stairs. EXAM: LEFT SHOULDER - 2+ VIEW COMPARISON:  None. FINDINGS: There is no evidence of fracture or dislocation. Glenohumeral joint space narrowing. Healed LEFT fourth rib fracture. No soft tissue swelling or radiopaque foreign body. IMPRESSION: No acute shoulder findings. Electronically Signed   By: Staci Righter M.D.   On: 04/10/2016 15:19   Dg Hip Unilat W Or Wo Pelvis 2-3 Views Left  Result Date: 04/10/2016 CLINICAL DATA:  Fall from ladder with subsequent fall down a flight of stairs. EXAM: DG HIP (WITH OR WITHOUT PELVIS) 2-3V LEFT COMPARISON:  None. FINDINGS: There is no evidence of hip fracture or dislocation. There is no evidence of arthropathy or other focal bone abnormality. IMPRESSION: Negative. Electronically Signed   By: Misty Stanley M.D.   On: 04/10/2016 15:23    ____________________________________________   PROCEDURES  Procedure(s) performed:   Procedures  None ____________________________________________   INITIAL IMPRESSION / ASSESSMENT AND PLAN / ED COURSE  Pertinent labs & imaging results that were available during my care of the patient were reviewed by me and considered in my  medical decision making (see chart for details).  Patient resents to the emergency department for evaluation of fall from a ladder. Unclear if this was a mechanical fall or near syncope. No full LOC. The patient is not anticoagulated. He has large abrasion to his left forehead and some left-sided jaw pain. The patient is able to bite down on a tongue depressor and twisted in his mouth with clenched teeth and break it making radicular fracture significantly less likely. He has minimal tenderness to palpation of his jaw. He has some mild tenderness to palpation of the midline of his thoracic and lumbar spine but exam is entirely consistent. He also is complaining of shoulder and hip pain. No abdominal tenderness. Patient has no abnormal bruising or obvious abnormality on exam. Plan for CT scan of the head and cervical spine along with chest x-ray, left shoulder x-ray, spine x-rays, left hip x-ray. Suspect mechanical fall but with some question will obtain EKG and labs as well.   03:45 PM Patient with new onset T12 compression fracture. Patient has seen Dr. Durene Cal in the past and will call in 2 weeks for outpatient follow up. Discussed plan with Dr. Cyndy Freeze on call with NSG. Patient is ambulatory in the  ED without difficulty.   At this time, I do not feel there is any life-threatening condition present. I have reviewed and discussed all results (EKG, imaging, lab, urine as appropriate), exam findings with patient. I have reviewed nursing notes and appropriate previous records.  I feel the patient is safe to be discharged home without further emergent workup. Discussed usual and customary return precautions. Patient and family (if present) verbalize understanding and are comfortable with this plan.  Patient will follow-up with their primary care provider. If they do not have a primary care provider, information for follow-up has been provided to them. All questions have been  answered.  ____________________________________________  FINAL CLINICAL IMPRESSION(S) / ED DIAGNOSES  Final diagnoses:  Fall, initial encounter  Injury of head, initial encounter  Strain of neck muscle, initial encounter  T12 compression fracture ([place])  Chest wall pain     MEDICATIONS GIVEN DURING THIS VISIT:  Medications  traMADol (ULTRAM) tablet 50 mg (50 mg Oral Given 04/10/16 1705)     NEW OUTPATIENT MEDICATIONS STARTED DURING THIS VISIT:  Discharge Medication List as of 04/10/2016  4:53 PM    START taking these medications   Details  traMADol (ULTRAM) 50 MG tablet Take 1 tablet (50 mg total) by mouth every 6 (six) hours as needed., Starting Mon 04/10/2016, Print         Note:  This document was prepared using Dragon voice recognition software and may include unintentional dictation errors.  Nanda Quinton, MD Emergency Medicine   Margette Fast, MD 04/10/16 2039

## 2016-04-10 NOTE — ED Triage Notes (Addendum)
Pt in from home via [facility] after fall off ladder, also falling down 15 steps. Pt landed head first, sustained L forehead abrasion, L flank, L shoulder and L hand pain. Pt denies neck pain, denies LOC, a&ox4. Initial VS for EMS 198/88, 88, 98%

## 2016-04-11 DIAGNOSIS — M5416 Radiculopathy, lumbar region: Secondary | ICD-10-CM | POA: Diagnosis not present

## 2016-04-11 DIAGNOSIS — S22080A Wedge compression fracture of T11-T12 vertebra, initial encounter for closed fracture: Secondary | ICD-10-CM | POA: Diagnosis not present

## 2016-04-11 DIAGNOSIS — M545 Low back pain: Secondary | ICD-10-CM | POA: Diagnosis not present

## 2016-04-11 DIAGNOSIS — Z6825 Body mass index (BMI) 25.0-25.9, adult: Secondary | ICD-10-CM | POA: Diagnosis not present

## 2016-04-12 DIAGNOSIS — F321 Major depressive disorder, single episode, moderate: Secondary | ICD-10-CM | POA: Diagnosis not present

## 2016-04-12 DIAGNOSIS — S60222A Contusion of left hand, initial encounter: Secondary | ICD-10-CM | POA: Diagnosis not present

## 2016-04-14 ENCOUNTER — Telehealth: Payer: Self-pay | Admitting: Pulmonary Disease

## 2016-04-14 NOTE — Telephone Encounter (Signed)
Spoke with pt. He is needing to know who supplied him with his CPAP machine. We have [facility] listed in his chart. I have given him their info. Nothing further was needed.

## 2016-04-22 ENCOUNTER — Emergency Department (HOSPITAL_COMMUNITY)
Admission: EM | Admit: 2016-04-22 | Discharge: 2016-04-23 | Disposition: A | Discharge status: Other Acute Inpatient Hospital | Payer: Medicare Other | Attending: Emergency Medicine | Admitting: Emergency Medicine

## 2016-04-22 ENCOUNTER — Encounter (HOSPITAL_COMMUNITY): Payer: Self-pay | Admitting: Emergency Medicine

## 2016-04-22 ENCOUNTER — Emergency Department (HOSPITAL_COMMUNITY): Payer: Medicare Other

## 2016-04-22 DIAGNOSIS — F323 Major depressive disorder, single episode, severe with psychotic features: Secondary | ICD-10-CM | POA: Diagnosis not present

## 2016-04-22 DIAGNOSIS — Z7982 Long term (current) use of aspirin: Secondary | ICD-10-CM | POA: Diagnosis not present

## 2016-04-22 DIAGNOSIS — F332 Major depressive disorder, recurrent severe without psychotic features: Secondary | ICD-10-CM | POA: Diagnosis not present

## 2016-04-22 DIAGNOSIS — R918 Other nonspecific abnormal finding of lung field: Secondary | ICD-10-CM | POA: Diagnosis not present

## 2016-04-22 DIAGNOSIS — R45851 Suicidal ideations: Secondary | ICD-10-CM | POA: Diagnosis not present

## 2016-04-22 DIAGNOSIS — Z79899 Other long term (current) drug therapy: Secondary | ICD-10-CM | POA: Diagnosis not present

## 2016-04-22 DIAGNOSIS — I10 Essential (primary) hypertension: Secondary | ICD-10-CM | POA: Insufficient documentation

## 2016-04-22 DIAGNOSIS — Z049 Encounter for examination and observation for unspecified reason: Secondary | ICD-10-CM

## 2016-04-22 DIAGNOSIS — F331 Major depressive disorder, recurrent, moderate: Secondary | ICD-10-CM

## 2016-04-22 DIAGNOSIS — Z87891 Personal history of nicotine dependence: Secondary | ICD-10-CM | POA: Insufficient documentation

## 2016-04-22 DIAGNOSIS — J45909 Unspecified asthma, uncomplicated: Secondary | ICD-10-CM | POA: Diagnosis not present

## 2016-04-22 LAB — COMPREHENSIVE METABOLIC PANEL
ALBUMIN: 4.4 g/dL (ref 3.5–5.0)
ALT: 29 U/L (ref 17–63)
AST: 25 U/L (ref 15–41)
Alkaline Phosphatase: 108 U/L (ref 38–126)
Anion gap: 8 (ref 5–15)
BUN: 19 mg/dL (ref 6–20)
CHLORIDE: 96 mmol/L — AB (ref 101–111)
CO2: 26 mmol/L (ref 22–32)
CREATININE: 0.84 mg/dL (ref 0.61–1.24)
Calcium: 9.2 mg/dL (ref 8.9–10.3)
GFR calc Af Amer: >60 mL/min (ref >60)
GFR calc non Af Amer: >60 mL/min (ref >60)
Glucose, Bld: 135 mg/dL — ABNORMAL HIGH (ref 65–99)
POTASSIUM: 3.8 mmol/L (ref 3.5–5.1)
SODIUM: 130 mmol/L — AB (ref 135–145)
Total Bilirubin: 0.6 mg/dL (ref 0.3–1.2)
Total Protein: 6.9 g/dL (ref 6.5–8.1)

## 2016-04-22 LAB — URINALYSIS, COMPLETE (UACMP) WITH MICROSCOPIC
BILIRUBIN URINE: NEGATIVE
Bacteria, UA: NONE SEEN
GLUCOSE, UA: NEGATIVE mg/dL
Ketones, ur: NEGATIVE mg/dL
LEUKOCYTES UA: NEGATIVE
Nitrite: NEGATIVE
PH: 6 (ref 5.0–8.0)
Protein, ur: NEGATIVE mg/dL
RBC / HPF: NONE SEEN RBC/hpf (ref 0–5)
SPECIFIC GRAVITY, URINE: 1.005 (ref 1.005–1.030)
SQUAMOUS EPITHELIAL / LPF: NONE SEEN

## 2016-04-22 LAB — RAPID URINE DRUG SCREEN, HOSP PERFORMED
AMPHETAMINES: NOT DETECTED
BENZODIAZEPINES: POSITIVE — AB
Barbiturates: NOT DETECTED
Cocaine: NOT DETECTED
OPIATES: NOT DETECTED
TETRAHYDROCANNABINOL: NOT DETECTED

## 2016-04-22 LAB — CBC
HCT: 38.6 % — ABNORMAL LOW (ref 39.0–52.0)
HEMOGLOBIN: 13.5 g/dL (ref 13.0–17.0)
MCH: 31.5 pg (ref 26.0–34.0)
MCHC: 35 g/dL (ref 30.0–36.0)
MCV: 90.2 fL (ref 78.0–100.0)
PLATELETS: 285 10*3/uL (ref 150–400)
RBC: 4.28 MIL/uL (ref 4.22–5.81)
RDW: 12.2 % (ref 11.5–15.5)
WBC: 8.5 10*3/uL (ref 4.0–10.5)

## 2016-04-22 LAB — ACETAMINOPHEN LEVEL: Acetaminophen (Tylenol), Serum: <10 ug/mL — ABNORMAL LOW (ref 10–30)

## 2016-04-22 LAB — ETHANOL: Alcohol, Ethyl (B): <5 mg/dL (ref <5)

## 2016-04-22 LAB — TSH: TSH: 0.805 u[IU]/mL (ref 0.350–4.500)

## 2016-04-22 LAB — SALICYLATE LEVEL

## 2016-04-22 MED ORDER — ZOLPIDEM TARTRATE 5 MG PO TABS
5.0000 mg | ORAL_TABLET | Freq: Every evening | ORAL | Status: DC | PRN
  Administered 2016-04-22: 5 mg via ORAL
  Filled 2016-04-22: qty 1

## 2016-04-22 MED ORDER — MIRTAZAPINE 30 MG PO TABS
15.0000 mg | ORAL_TABLET | Freq: Every day | ORAL | Status: DC
Start: 2016-04-22 — End: 2016-04-23
  Administered 2016-04-22: 15 mg via ORAL
  Filled 2016-04-22: qty 1

## 2016-04-22 MED ORDER — IBUPROFEN 200 MG PO TABS
600.0000 mg | ORAL_TABLET | Freq: Three times a day (TID) | ORAL | Status: DC | PRN
  Administered 2016-04-22 – 2016-04-23 (×2): 600 mg via ORAL
  Filled 2016-04-22 (×2): qty 3

## 2016-04-22 MED ORDER — B COMPLEX-C PO TABS
1.0000 | ORAL_TABLET | Freq: Every day | ORAL | Status: DC
  Administered 2016-04-23: 1 via ORAL
  Filled 2016-04-22: qty 1

## 2016-04-22 MED ORDER — ONDANSETRON HCL 4 MG PO TABS: 4.0000 mg | ORAL_TABLET | Freq: Three times a day (TID) | ORAL | Status: DC | PRN

## 2016-04-22 MED ORDER — FLUTICASONE PROPIONATE 50 MCG/ACT NA SUSP
2.0000 | Freq: Every day | NASAL | Status: DC
  Filled 2016-04-22: qty 16

## 2016-04-22 MED ORDER — AMANTADINE HCL 100 MG PO CAPS
100.0000 mg | ORAL_CAPSULE | Freq: Two times a day (BID) | ORAL | Status: DC
  Administered 2016-04-22 – 2016-04-23 (×2): 100 mg via ORAL
  Filled 2016-04-22 (×2): qty 1

## 2016-04-22 MED ORDER — ALBUTEROL SULFATE HFA 108 (90 BASE) MCG/ACT IN AERS: 1.0000 | INHALATION_SPRAY | Freq: Four times a day (QID) | RESPIRATORY_TRACT | Status: DC | PRN

## 2016-04-22 MED ORDER — BOOST / RESOURCE BREEZE PO LIQD
1.0000 | Freq: Two times a day (BID) | ORAL | Status: DC
  Filled 2016-04-22 (×2): qty 1

## 2016-04-22 MED ORDER — LORAZEPAM 1 MG PO TABS
1.0000 mg | ORAL_TABLET | Freq: Four times a day (QID) | ORAL | Status: DC | PRN
  Administered 2016-04-22 – 2016-04-23 (×3): 1 mg via ORAL
  Filled 2016-04-22 (×3): qty 1

## 2016-04-22 MED ORDER — NICOTINE 21 MG/24HR TD PT24: 21.0000 mg | MEDICATED_PATCH | Freq: Every day | TRANSDERMAL | Status: DC | PRN

## 2016-04-22 MED ORDER — SODIUM CHLORIDE 0.9 % IV SOLN
80.0000 mg | Freq: Once | INTRAVENOUS | Status: DC
  Filled 2016-04-22: qty 80

## 2016-04-22 MED ORDER — ACETAMINOPHEN 325 MG PO TABS
650.0000 mg | ORAL_TABLET | ORAL | Status: DC | PRN
  Filled 2016-04-22: qty 2

## 2016-04-22 MED ORDER — POLYVINYL ALCOHOL 1.4 % OP SOLN
1.0000 [drp] | Freq: Three times a day (TID) | OPHTHALMIC | Status: DC | PRN
  Filled 2016-04-22: qty 15

## 2016-04-22 MED ORDER — LAMOTRIGINE 25 MG PO TABS
25.0000 mg | ORAL_TABLET | Freq: Every day | ORAL | Status: DC
  Administered 2016-04-22 – 2016-04-23 (×2): 25 mg via ORAL
  Filled 2016-04-22 (×2): qty 1

## 2016-04-22 MED ORDER — GABAPENTIN 300 MG PO CAPS
900.0000 mg | ORAL_CAPSULE | Freq: Every day | ORAL | Status: DC
  Administered 2016-04-22: 900 mg via ORAL
  Filled 2016-04-22: qty 3

## 2016-04-22 MED ORDER — ALUM & MAG HYDROXIDE-SIMETH 200-200-20 MG/5ML PO SUSP: 30.0000 mL | ORAL | Status: DC | PRN

## 2016-04-22 MED ORDER — ASPIRIN EC 81 MG PO TBEC
81.0000 mg | DELAYED_RELEASE_TABLET | Freq: Every day | ORAL | Status: DC
  Administered 2016-04-23: 81 mg via ORAL
  Filled 2016-04-22: qty 1

## 2016-04-22 MED ORDER — VORTIOXETINE HBR 20 MG PO TABS
20.0000 mg | ORAL_TABLET | Freq: Every day | ORAL | Status: DC
  Administered 2016-04-23: 20 mg via ORAL
  Filled 2016-04-22: qty 20

## 2016-04-22 MED ORDER — ONDANSETRON HCL 4 MG PO TABS
4.0000 mg | ORAL_TABLET | Freq: Every day | ORAL | Status: DC
  Administered 2016-04-22 – 2016-04-23 (×2): 4 mg via ORAL
  Filled 2016-04-22 (×2): qty 1

## 2016-04-22 NOTE — ED Notes (Signed)
Counselor in with patient.  Patient c/o pain in his legs and ribs.  PRN medications obtained.

## 2016-04-22 NOTE — ED Notes (Signed)
Patient's wife coming to visit tonight and bringing patient's CPAP machine which he uses every night.

## 2016-04-22 NOTE — ED Provider Notes (Signed)
[facility] DEPT Provider Note   CSN: [id number] Arrival date & time: 04/22/16  1153     History   Chief Complaint Chief Complaint  Patient presents with  . Depression  . Suicidal    HPI Matthew Patrick is a 70 y.o. male.  He presents for evaluation of depressive symptoms, feeling sad, feeling suicidal, decreased oral intake, and sleeplessness for several months. He's been feeling worthless and that he is a burden on his family for several days and this makes him feel like he wants to die. He does not have an active suicidal plan or intent at this time. Last saw his psychiatrist about 2 weeks ago. He complains of problems with his memory and related to having had ECT multiple times in the past. He denies recent fever, chills, cough, shortness of breath, focal weakness or paresthesias. There are no other known modifying factors.  HPI  Past Medical History:  Diagnosis Date  . Anxiety   . Asthma   . Depression   . Hyperlipemia   . Hypertension   . Sleep apnea    has c-pap     Patient Active Problem List   Diagnosis Date Noted  . Generalized weakness 09/22/2015  . Depression 09/22/2015  . Anemia 09/22/2015  . Obsessive compulsive disorder 09/22/2015  . Generalized anxiety disorder 09/22/2015  . SIADH (syndrome of inappropriate ADH production) ([place]) 09/21/2015  . Hyponatremia 09/20/2015  . Severe recurrent major depression with psychotic features ([place]) 09/19/2015  . Severe recurrent major depression without psychotic features ([place]) 09/17/2015  . Suicidal ideation 09/17/2015  . Cough 01/19/2015  . Restless leg 01/19/2015  . COLD (chronic obstructive lung disease) ([place]) 05/22/2014  . Lung nodule < 6cm on CT 05/22/2014  . OSA (obstructive sleep apnea) 07/11/2013  . Lung nodule 05/11/2013  . Nocturnal hypoxemia 05/11/2013  . Obstructive lung disease ([place]) 04/17/2013  . Chronic cough 04/17/2013  . Syncope 03/25/2011  . Hypertension 03/25/2011  . Hypokalemia 03/25/2011    . Asthma 03/25/2011  . Sciatica 03/25/2011  . SOB (shortness of breath) 03/25/2011    Past Surgical History:  Procedure Laterality Date  . 2D Echocardiogram  03/27/11  . CATARACT EXTRACTION    . Neck skin tag biopsy    . TONSILLECTOMY AND ADENOIDECTOMY         Home Medications    Prior to Admission medications   Medication Sig Start Date End Date Taking? Authorizing Provider  albuterol (PROVENTIL HFA;VENTOLIN HFA) 108 (90 Base) MCG/ACT inhaler Inhale 1 puff into the lungs every 6 (six) hours as needed for wheezing or shortness of breath.   Yes Historical Provider, MD  amantadine (SYMMETREL) 100 MG capsule Take 100 mg by mouth 2 (two) times daily.   Yes Historical Provider, MD  aspirin 81 MG tablet Take 81 mg by mouth daily.   Yes Historical Provider, MD  B Complex-C (B-COMPLEX WITH VITAMIN C) tablet Take 1 tablet by mouth daily.   Yes Historical Provider, MD  fluticasone (FLONASE) 50 MCG/ACT nasal spray Place 2 sprays into both nostrils daily.   Yes Historical Provider, MD  gabapentin (NEURONTIN) 300 MG capsule Take 900 mg by mouth at bedtime.    Yes Historical Provider, MD  ibuprofen (ADVIL,MOTRIN) 200 MG tablet Take 600 mg by mouth every 6 (six) hours as needed.   Yes Historical Provider, MD  lamoTRIgine (LAMICTAL) 25 MG tablet Take 25 mg by mouth daily. 04/20/16  Yes Historical Provider, MD  LORazepam (ATIVAN) 1 MG tablet Take 1 mg by  mouth 4 (four) times daily as needed. 03/28/16  Yes Historical Provider, MD  mirtazapine (REMERON) 15 MG tablet Take 15 mg by mouth at bedtime. 04/20/16  Yes Historical Provider, MD  ondansetron (ZOFRAN) 4 MG tablet Take 4 mg by mouth daily. 02/03/16  Yes Historical Provider, MD  Polyethyl Glycol-Propyl Glycol (SYSTANE ULTRA OP) Apply 1 drop to eye.   Yes Historical Provider, MD  vortioxetine HBr (TRINTELLIX) 20 MG TABS Take 20 mg by mouth daily. 12/17/15  Yes Historical Provider, MD  feeding supplement (BOOST / RESOURCE BREEZE) LIQD Take 1 Container  by mouth 2 (two) times daily between meals. Patient not taking: Reported on 04/22/2016 09/22/15   Theodoro Grist, MD  LORazepam (ATIVAN) 0.5 MG tablet Take 0.5 tablets (0.25 mg total) by mouth 4 (four) times daily. Patient not taking: Reported on 04/22/2016 09/22/15   Theodoro Grist, MD  QUEtiapine (SEROQUEL) 50 MG tablet Take 3 tablets (150 mg total) by mouth at bedtime. Patient not taking: Reported on 04/22/2016 09/24/15   Gonzella Lex, MD  traMADol (ULTRAM) 50 MG tablet Take 1 tablet (50 mg total) by mouth every 6 (six) hours as needed. Patient not taking: Reported on 04/22/2016 04/10/16   Margette Fast, MD    Family History Family History  Problem Relation Age of Onset  . Heart disease Father   . Emphysema Mother   . Allergies Mother     Social History Social History  Substance Use Topics  . Smoking status: Former Smoker    Packs/day: 1.00    Years: 5.00    Types: Cigarettes    Quit date: 04/25/1967  . Smokeless tobacco: Never Used  . Alcohol use 4.2 oz/week    7 Glasses of wine per week     Comment: 2-4 glasses of wine per night, none in 3 weeks     Allergies   Patient has no known allergies.   Review of Systems Review of Systems  All other systems reviewed and are negative.    Physical Exam Updated Vital Signs BP 156/90 (BP Location: Right Arm)   Pulse 86   Temp 98.4 F (36.9 C) (Oral)   Resp 18   SpO2 99%   Physical Exam  Constitutional: He is oriented to person, place, and time. He appears well-developed. No distress.  Elderly-appearing, frail  HENT:  Head: Normocephalic and atraumatic.  Right Ear: External ear normal.  Left Ear: External ear normal.  Eyes: Conjunctivae and EOM are normal. Pupils are equal, round, and reactive to light.  Neck: Normal range of motion and phonation normal. Neck supple.  Cardiovascular: Normal rate, regular rhythm and normal heart sounds.   Pulmonary/Chest: Effort normal and breath sounds normal. No respiratory distress.  He exhibits no bony tenderness.  Abdominal: Soft. There is no tenderness.  Musculoskeletal: Normal range of motion.  Neurological: He is alert and oriented to person, place, and time. No cranial nerve deficit or sensory deficit. He exhibits normal muscle tone. Coordination normal.  No dysarthria, aphasia or nystagmus.  Skin: Skin is warm, dry and intact.  Psychiatric: His behavior is normal. Judgment and thought content normal.  Depressed affect.  Nursing note and vitals reviewed.    ED Treatments / Results  Labs (all labs ordered are listed, but only abnormal results are displayed) Labs Reviewed  COMPREHENSIVE METABOLIC PANEL - Abnormal; Notable for the following:       Result Value   Sodium 130 (*)    Chloride 96 (*)    Glucose, Bld  135 (*)    All other components within normal limits  ACETAMINOPHEN LEVEL - Abnormal; Notable for the following:    Acetaminophen (Tylenol), Serum <10 (*)    All other components within normal limits  CBC - Abnormal; Notable for the following:    HCT 38.6 (*)    All other components within normal limits  RAPID URINE DRUG SCREEN, HOSP PERFORMED - Abnormal; Notable for the following:    Benzodiazepines POSITIVE (*)    All other components within normal limits  URINALYSIS, COMPLETE (UACMP) WITH MICROSCOPIC - Abnormal; Notable for the following:    Color, Urine STRAW (*)    Hgb urine dipstick SMALL (*)    All other components within normal limits  ETHANOL  SALICYLATE LEVEL  TSH    EKG  EKG Interpretation  Date/Time:  Saturday April 22 2016 14:09:51 EST Ventricular Rate:  75 PR Interval:  136 QRS Duration: 78 QT Interval:  384 QTC Calculation: 428 R Axis:   68 Text Interpretation:  Normal sinus rhythm Normal ECG since last tracing no significant change Confirmed by Eulis Foster  MD, Vira Agar [id number]) on 04/22/2016 3:50:14 PM       Radiology Dg Chest 2 View  Result Date: 04/22/2016 CLINICAL DATA:  Medical clearance; no chest complaints  today; no known cardiopulmonary problems; ex smoker EXAM: CHEST  2 VIEW COMPARISON:  04/10/2016 FINDINGS: Lungs are mildly hyperinflated. Heart size is normal. There are no focal consolidations or pleural effusions. Minimal atelectasis or scarring at the left lung base. Remote mild wedge deformity of T 12. IMPRESSION: No active cardiopulmonary disease. Electronically Signed   By: Nolon Nations M.D.   On: 04/22/2016 13:54    Procedures Procedures (including critical care time)  Medications Ordered in ED Medications  acetaminophen (TYLENOL) tablet 650 mg (not administered)  ibuprofen (ADVIL,MOTRIN) tablet 600 mg (not administered)  zolpidem (AMBIEN) tablet 5 mg (not administered)  nicotine (NICODERM CQ - dosed in mg/24 hours) patch 21 mg (not administered)  ondansetron (ZOFRAN) tablet 4 mg (not administered)  alum & mag hydroxide-simeth (MAALOX/MYLANTA) 200-200-20 MG/5ML suspension 30 mL (not administered)  feeding supplement (BOOST / RESOURCE BREEZE) liquid 1 Container (not administered)  albuterol (PROVENTIL HFA;VENTOLIN HFA) 108 (90 Base) MCG/ACT inhaler 1 puff (not administered)  amantadine (SYMMETREL) capsule 100 mg (not administered)  aspirin EC tablet 81 mg (not administered)  B-complex with vitamin C tablet 1 tablet (not administered)  fluticasone (FLONASE) 50 MCG/ACT nasal spray 2 spray (not administered)  gabapentin (NEURONTIN) capsule 900 mg (not administered)  lamoTRIgine (LAMICTAL) tablet 25 mg (not administered)  LORazepam (ATIVAN) tablet 1 mg (not administered)  mirtazapine (REMERON) tablet 15 mg (not administered)  ondansetron (ZOFRAN) tablet 4 mg (not administered)  Polyethyl Glycol-Propyl Glycol 0.4-0.3 % SOLN (not administered)  vortioxetine HBr (TRINTELLIX) 20 MG tablet 20 mg (not administered)     Initial Impression / Assessment and Plan / ED Course  I have reviewed the triage vital signs and the nursing notes.  Pertinent labs & imaging results that were  available during my care of the patient were reviewed by me and considered in my medical decision making (see chart for details).  Clinical Course as of Apr 22 1612  Sat Apr 22, 2016  1611 Normal WBC, UA: 0-5 [EW]  1611 Prescribed medication Benzodiazepines: (!) POSITIVE [EW]  1612 Normal WBC: 8.5 [EW]  1612 Normal Acetaminophen (Tylenol), S: (!) <10 [EW]  1612 Mildly low, consistent with poor nutrition Sodium: (!) 130 [EW]  1612 Mildly low Chloride: Marland Kitchen)  96 [EW]  1612 Slightly elevated Glucose: (!) 135 [EW]  1612 At this time. He is medically cleared for treatment by psychiatry, either inpatient or outpatient. Only medical recommendation would be to encourage him to drink more fluids, and try to eat 3 meals each day, hopefully treating his depression will improve his ability to eat and drink.  [EW]    Clinical Course User Index [EW] Daleen Bo, MD    Medications  acetaminophen (TYLENOL) tablet 650 mg (not administered)  ibuprofen (ADVIL,MOTRIN) tablet 600 mg (not administered)  zolpidem (AMBIEN) tablet 5 mg (not administered)  nicotine (NICODERM CQ - dosed in mg/24 hours) patch 21 mg (not administered)  ondansetron (ZOFRAN) tablet 4 mg (not administered)  alum & mag hydroxide-simeth (MAALOX/MYLANTA) 200-200-20 MG/5ML suspension 30 mL (not administered)  feeding supplement (BOOST / RESOURCE BREEZE) liquid 1 Container (not administered)  albuterol (PROVENTIL HFA;VENTOLIN HFA) 108 (90 Base) MCG/ACT inhaler 1 puff (not administered)  amantadine (SYMMETREL) capsule 100 mg (not administered)  aspirin EC tablet 81 mg (not administered)  B-complex with vitamin C tablet 1 tablet (not administered)  fluticasone (FLONASE) 50 MCG/ACT nasal spray 2 spray (not administered)  gabapentin (NEURONTIN) capsule 900 mg (not administered)  lamoTRIgine (LAMICTAL) tablet 25 mg (not administered)  LORazepam (ATIVAN) tablet 1 mg (not administered)  mirtazapine (REMERON) tablet 15 mg (not administered)    ondansetron (ZOFRAN) tablet 4 mg (not administered)  Polyethyl Glycol-Propyl Glycol 0.4-0.3 % SOLN (not administered)  vortioxetine HBr (TRINTELLIX) 20 MG tablet 20 mg (not administered)    Patient Vitals for the past 24 hrs:  BP Temp Temp src Pulse Resp SpO2  04/22/16 1225 156/90 98.4 F (36.9 C) Oral 86 18 99 %    TTS consultation   Final Clinical Impressions(s) / ED Diagnoses   Final diagnoses:  Disease ruled out after examination  Moderate episode of recurrent major depressive disorder ([place])   Depression with somatic complaints, and mild dehydration, malnutrition. No indication for medical interventions or hospitalizations.  Nursing Notes Reviewed/ Care Coordinated, and agree without changes. Applicable Imaging Reviewed.  Interpretation of Laboratory Data incorporated into ED treatment  Plan- as per TTS in conjunction with oncoming provider team   New Prescriptions New Prescriptions   No medications on file     Daleen Bo, MD 04/22/16 1615

## 2016-04-22 NOTE — ED Notes (Signed)
Patient transported to X-ray 

## 2016-04-22 NOTE — ED Notes (Signed)
Spouse took patient's clothing, wallet, and cell phone home.

## 2016-04-22 NOTE — ED Triage Notes (Signed)
Patient here from home brought in by his wife for recent depression and memory problems.  Patient alert but with flat affect.  Admits to suicidal ideation but does not have a plan.  Denies HI.

## 2016-04-22 NOTE — ED Notes (Signed)
Patient continues to be very depressed with suicidal ideation.  Saying things to nurse like, "what is there to live for."  "My wife doesn't want me at home anymore."  The wife was surprised to hear that as she reports they have a very close relationship and she has been very supportive of him the past year which has been a very hard year for them.  Tried to console wife and let her know she had done the right thing by bringing patient here and it was his depression making him say those things.

## 2016-04-22 NOTE — ED Notes (Signed)
Patient told wife on the telephone not to bring the CPAP machine tonight.

## 2016-04-22 NOTE — BH Assessment (Addendum)
Patients information faxed to the following facilities along with TSH, Chest x-ray, and EKG:  [facility] -  [place] - [place] - [place] - [phone] - [place]  - [phone] [facility]  - [phone] [facility] - [phone]  .   Rosalin Hawking, LCSW Therapeutic Triage Specialist [place] 04/22/2016 9:02 PM

## 2016-04-22 NOTE — ED Notes (Signed)
Patient asking for medication to help him rest.  Dr. Eulis Foster notified.

## 2016-04-22 NOTE — ED Notes (Signed)
Wife coming back to visit patient.

## 2016-04-22 NOTE — ED Triage Notes (Signed)
Patient brought in by his wife with complaints of SI. Denies HI. Patient states that he was been battling depression for over a year. Medications are not helping. States that he is sad that he is "dragging my wife through this". Patient very paranoid.

## 2016-04-22 NOTE — BH Assessment (Signed)
Assessment Note  Matthew Patrick is an 70 y.o. male who was transported to [facility] by Spouse because of depression and memory issues.  Patient reports experiencing current SI with a plan to overdose if he can get pills.  Patient reports feeling despondent because of marital distress in that he reports Spouse is ready to leave the relationship.  Patient also reports adult children are disappointment in him.  Patient presented orientated x3, mood "depressed and anxious", affect constricted congruent with mood and negative cognitive distortions.  Patient denied HI and AVH.  Patient reports a history of ECT tx in which he reports stopping because of the negative side effect on his memory.  Patient reports neglecting hygiene due to lack of energy and motivation, experiencing mental confusion and being unable to make decisions, self isolating, sleeping 3 hours per night due to racing thoughts, and no appetite and losing 10 lbs.  Patient reports experiencing anxiety attacks when feeling overwhelmed.  Patient reports consuming 1 or 2 glasses of red wine daily and denied illicit substance use.  Patient reports seeing Dr. Toy Care for psychotropic medication management.  Patient was hospitalized at [facility] in May 2017 for depression.  Patient meets inpatient criteria and gero-psych placement will be sought.       Diagnosis:  Major Depressive Disorder, recurrent, severe  Past Medical History:  Past Medical History:  Diagnosis Date  . Anxiety   . Asthma   . Depression   . Hyperlipemia   . Hypertension   . Sleep apnea    has c-pap     Past Surgical History:  Procedure Laterality Date  . 2D Echocardiogram  03/27/11  . CATARACT EXTRACTION    . Neck skin tag biopsy    . TONSILLECTOMY AND ADENOIDECTOMY      Family History:  Family History  Problem Relation Age of Onset  . Heart disease Father   . Emphysema Mother   . Allergies Mother     Social History:  reports that he quit smoking about 49 years ago. His  smoking use included Cigarettes. He has a 5.00 pack-year smoking history. He has never used smokeless tobacco. He reports that he drinks about 4.2 oz of alcohol per week . He reports that he does not use drugs.  Additional Social History:     CIWA: CIWA-Ar BP: 156/90 Pulse Rate: 86 COWS:    Allergies: No Known Allergies  Home Medications:  (Not in a hospital admission)  OB/GYN Status:  No LMP for male patient.  General Assessment Data Location of Assessment: [facility] ED TTS Assessment: In system Is this a Tele or Face-to-Face Assessment?: Face-to-Face Is this an Initial Assessment or a Re-assessment for this encounter?: Initial Assessment Marital status: Married [place] name: N/A Is patient pregnant?: No Pregnancy Status: No Living Arrangements: Spouse/significant other Can pt return to current living arrangement?: Yes Admission Status: Voluntary Is patient capable of signing voluntary admission?: Yes Referral Source: Self/Family/Friend Insurance type: Medicare  Medical Screening Exam ([place]) Medical Exam completed: Yes  Crisis Care Plan Living Arrangements: Spouse/significant other Legal Guardian: Other: (Self) Name of Psychiatrist: Dr. Toy Care Name of Therapist: None  Education Status Is patient currently in school?: No Current Grade: N/A Highest grade of school patient has completed: 12th Name of school: N/A Contact person: N/A  Risk to self with the past 6 months Suicidal Ideation: Yes-Currently Present Has patient been a risk to self within the past 6 months prior to admission? : No Suicidal Intent: No-Not Currently/Within Last  6 Months Has patient had any suicidal intent within the past 6 months prior to admission? : No Is patient at risk for suicide?: Yes Suicidal Plan?: No-Not Currently/Within Last 6 Months Has patient had any suicidal plan within the past 6 months prior to admission? : Yes Access to Means: No What has been your use of drugs/alcohol  within the last 12 months?: Glass of red wine Previous Attempts/Gestures: No How many times?: 0 Other Self Harm Risks: None Triggers for Past Attempts: None known Intentional Self Injurious Behavior: None Family Suicide History: Unknown Recent stressful life event(s): Other (Comment), Financial Problems, Recent negative physical changes (Marital distress) Persecutory voices/beliefs?: No Depression: Yes Depression Symptoms: Despondent, Insomnia, Isolating, Fatigue, Loss of interest in usual pleasures, Feeling worthless/self pity Substance abuse history and/or treatment for substance abuse?: No Suicide prevention information given to non-admitted patients: Not applicable  Risk to Others within the past 6 months Homicidal Ideation: No-Not Currently/Within Last 6 Months Does patient have any lifetime risk of violence toward others beyond the six months prior to admission? : No Thoughts of Harm to Others: No-Not Currently Present/Within Last 6 Months Current Homicidal Intent: No-Not Currently/Within Last 6 Months Current Homicidal Plan: No-Not Currently/Within Last 6 Months Access to Homicidal Means: Yes Identified Victim: N/A History of harm to others?: No Assessment of Violence: None Noted Violent Behavior Description: N/A Does patient have access to weapons?: No (Patient denied having a gun in the home) Criminal Charges Pending?: No (Patient denied) Does patient have a court date: No (Patient denied) Is patient on probation?: No (Patient denied)  Psychosis Hallucinations: None noted Delusions: None noted  Mental Status Report Appearance/Hygiene: In hospital gown Eye Contact: Fair Motor Activity: Tremors Speech: Logical/coherent Level of Consciousness: Alert Mood: Depressed, Sad, Anxious Affect: Anxious, Depressed, Sad, Constricted Anxiety Level: Moderate Thought Processes: Coherent, Circumstantial Judgement: Impaired Orientation: Person, Place, Time Obsessive Compulsive  Thoughts/Behaviors: None  Cognitive Functioning Concentration: Decreased Memory: Recent Impaired, Remote Impaired IQ: Average Insight: Poor Impulse Control: Poor Appetite: Poor Weight Loss: 10 Weight Gain: 0 Sleep: Decreased Total Hours of Sleep: 3 Vegetative Symptoms: None  ADLScreening [facility] Assessment Services) Patient's cognitive ability adequate to safely complete daily activities?: Yes Patient able to express need for assistance with ADLs?: Yes Independently performs ADLs?: Yes (appropriate for developmental age)  Prior Inpatient Therapy Prior Inpatient Therapy: Yes Prior Therapy Dates: May 2017 Prior Therapy Facilty/Provider(s): [facility] Reason for Treatment: Depression  Prior Outpatient Therapy Prior Outpatient Therapy: No Prior Therapy Dates: N/A Prior Therapy Facilty/Provider(s): N/A Reason for Treatment: N/A Does patient have an ACCT team?: No Does patient have Intensive In-House Services?  : No Does patient have [facility] services? : No Does patient have P4CC services?: No  ADL Screening (condition at time of admission) Patient's cognitive ability adequate to safely complete daily activities?: Yes Is the patient deaf or have difficulty hearing?: No Does the patient have difficulty seeing, even when wearing glasses/contacts?: No Does the patient have difficulty concentrating, remembering, or making decisions?: Yes (Patient reports difficulty recalling short and long term information to make decisions) Patient able to express need for assistance with ADLs?: Yes Does the patient have difficulty dressing or bathing?: No Independently performs ADLs?: Yes (appropriate for developmental age) Does the patient have difficulty walking or climbing stairs?: No Weakness of Legs: Both (Patient reports falling recently from leg weakness) Weakness of Arms/Hands: None  Home Assistive Devices/Equipment Home Assistive Devices/Equipment: None    Abuse/Neglect Assessment  (Assessment to be complete while patient is alone) Physical  Abuse: Denies Verbal Abuse: Denies Sexual Abuse: Denies Exploitation of patient/patient's resources: Denies Self-Neglect: Denies Possible abuse reported to:: [place] / Beliefs Cultural Requests During Hospitalization: None Spiritual Requests During Hospitalization: None   Advance Directives (For Healthcare) Does Patient Have a Medical Advance Directive?: No (Patient declined)    Additional Information 1:1 In Past 12 Months?: Yes CIRT Risk: No Elopement Risk: No Does patient have medical clearance?: Yes     Disposition:  Disposition Initial Assessment Completed for this Encounter: Yes Disposition of Patient: Inpatient treatment program Type of inpatient treatment program: Adult (gero-psych)  On Site Evaluation by:   Reviewed with Physician:    Dey-Johnson,Lupie Sawa 04/22/2016 5:14 PM

## 2016-04-22 NOTE — ED Notes (Signed)
Belongings placed in locker 31 including patient's back brace that he says he needs to wear when he is walking.  Patient lying down on bed calm and cooperative.

## 2016-04-23 DIAGNOSIS — M4854XD Collapsed vertebra, not elsewhere classified, thoracic region, subsequent encounter for fracture with routine healing: Secondary | ICD-10-CM | POA: Diagnosis present

## 2016-04-23 DIAGNOSIS — J45909 Unspecified asthma, uncomplicated: Secondary | ICD-10-CM | POA: Diagnosis present

## 2016-04-23 DIAGNOSIS — E785 Hyperlipidemia, unspecified: Secondary | ICD-10-CM | POA: Diagnosis present

## 2016-04-23 DIAGNOSIS — G2581 Restless legs syndrome: Secondary | ICD-10-CM | POA: Diagnosis present

## 2016-04-23 DIAGNOSIS — F607 Dependent personality disorder: Secondary | ICD-10-CM | POA: Diagnosis present

## 2016-04-23 DIAGNOSIS — Z7982 Long term (current) use of aspirin: Secondary | ICD-10-CM | POA: Diagnosis not present

## 2016-04-23 DIAGNOSIS — F419 Anxiety disorder, unspecified: Secondary | ICD-10-CM | POA: Diagnosis present

## 2016-04-23 DIAGNOSIS — F411 Generalized anxiety disorder: Secondary | ICD-10-CM | POA: Diagnosis not present

## 2016-04-23 DIAGNOSIS — F323 Major depressive disorder, single episode, severe with psychotic features: Secondary | ICD-10-CM | POA: Diagnosis not present

## 2016-04-23 DIAGNOSIS — F332 Major depressive disorder, recurrent severe without psychotic features: Secondary | ICD-10-CM | POA: Diagnosis not present

## 2016-04-23 DIAGNOSIS — F329 Major depressive disorder, single episode, unspecified: Secondary | ICD-10-CM | POA: Diagnosis not present

## 2016-04-23 DIAGNOSIS — Z79899 Other long term (current) drug therapy: Secondary | ICD-10-CM | POA: Diagnosis not present

## 2016-04-23 DIAGNOSIS — E739 Lactose intolerance, unspecified: Secondary | ICD-10-CM | POA: Diagnosis present

## 2016-04-23 DIAGNOSIS — I10 Essential (primary) hypertension: Secondary | ICD-10-CM | POA: Diagnosis not present

## 2016-04-23 DIAGNOSIS — G4733 Obstructive sleep apnea (adult) (pediatric): Secondary | ICD-10-CM | POA: Diagnosis not present

## 2016-04-23 DIAGNOSIS — Z87891 Personal history of nicotine dependence: Secondary | ICD-10-CM | POA: Diagnosis not present

## 2016-04-23 DIAGNOSIS — S22089D Unspecified fracture of T11-T12 vertebra, subsequent encounter for fracture with routine healing: Secondary | ICD-10-CM | POA: Diagnosis not present

## 2016-04-23 DIAGNOSIS — D649 Anemia, unspecified: Secondary | ICD-10-CM | POA: Diagnosis not present

## 2016-04-23 NOTE — ED Notes (Signed)
Patient has a bed offer from [facility] center.  Patient will sign the volunteering admission form when wakes up.

## 2016-04-23 NOTE — BH Assessment (Signed)
Received a phone call from Kathlee Nations from [facility] who reports she needs the labs for the pt in order to continue reviewing for possible placement. Labs have been faxed to the number provided of [facility]  - [phone].  Lind Covert, MSW, Latanya Presser

## 2016-04-23 NOTE — ED Notes (Signed)
Patient is A & O x4.  He understood discharge instructions 

## 2016-04-23 NOTE — Progress Notes (Signed)
CSW spoke with patient at bedside. CSW informed patient about his bed options at [facility] and Teacher, music. [facility] reported that [facility] [place] from his residence. Patient reported that he had concerns about transportation. CSW informed patient that patient will be transported via [place] directly to the facility. CSW discussed treatment process with patient. Patient requested that CSW speak with his wife. CSW spoke with patient's wife regarding the transfer and treatment process. CSW provided patient's wife with phone number to the facility to inquire about patient's stay at the facility.   CSW obtained patient's signature on voluntary consent form for treatment to the facility. CSW faxed form to facility.

## 2016-04-23 NOTE — ED Notes (Signed)
Patient is refusing to sign the voluntary paper. Patient states, " I don't think that is a good idea, it's too far for my wife."

## 2016-04-23 NOTE — ED Notes (Signed)
Wife at bedside with Education officer, museum

## 2016-04-23 NOTE — ED Notes (Signed)
Attempted to give report, asked to hold off on giving report until patient signs consent form. Per Shirlee Limerick, RN at [facility]

## 2016-04-27 ENCOUNTER — Telehealth: Payer: Self-pay | Admitting: Pulmonary Disease

## 2016-04-27 NOTE — Telephone Encounter (Signed)
Called and spoke to pt's wife, Jeani Hawking. Jeani Hawking states the pt's CPAP machine is not working. Advised pt to contact DME to see if it can be fixed, gave APS phone number, and to call back if there are any other issues or concerns. Jeani Hawking verbalized understanding. Nothing further needed at this time.

## 2016-05-04 DIAGNOSIS — G4733 Obstructive sleep apnea (adult) (pediatric): Secondary | ICD-10-CM | POA: Diagnosis not present

## 2016-05-04 DIAGNOSIS — F329 Major depressive disorder, single episode, unspecified: Secondary | ICD-10-CM | POA: Diagnosis not present

## 2016-05-04 DIAGNOSIS — R32 Unspecified urinary incontinence: Secondary | ICD-10-CM | POA: Diagnosis not present

## 2016-05-08 DIAGNOSIS — F332 Major depressive disorder, recurrent severe without psychotic features: Secondary | ICD-10-CM | POA: Diagnosis not present

## 2016-05-09 ENCOUNTER — Encounter (HOSPITAL_COMMUNITY): Payer: Self-pay | Admitting: Psychiatry

## 2016-05-09 ENCOUNTER — Other Ambulatory Visit (HOSPITAL_COMMUNITY): Payer: Medicare Other | Attending: Psychiatry | Admitting: Psychiatry

## 2016-05-09 DIAGNOSIS — Z87891 Personal history of nicotine dependence: Secondary | ICD-10-CM | POA: Insufficient documentation

## 2016-05-09 DIAGNOSIS — G4730 Sleep apnea, unspecified: Secondary | ICD-10-CM | POA: Diagnosis not present

## 2016-05-09 DIAGNOSIS — J45909 Unspecified asthma, uncomplicated: Secondary | ICD-10-CM | POA: Insufficient documentation

## 2016-05-09 DIAGNOSIS — F332 Major depressive disorder, recurrent severe without psychotic features: Secondary | ICD-10-CM | POA: Diagnosis present

## 2016-05-09 DIAGNOSIS — E785 Hyperlipidemia, unspecified: Secondary | ICD-10-CM | POA: Diagnosis not present

## 2016-05-09 DIAGNOSIS — F329 Major depressive disorder, single episode, unspecified: Secondary | ICD-10-CM | POA: Diagnosis not present

## 2016-05-09 DIAGNOSIS — Z7982 Long term (current) use of aspirin: Secondary | ICD-10-CM | POA: Insufficient documentation

## 2016-05-09 DIAGNOSIS — I10 Essential (primary) hypertension: Secondary | ICD-10-CM | POA: Insufficient documentation

## 2016-05-09 DIAGNOSIS — F419 Anxiety disorder, unspecified: Secondary | ICD-10-CM | POA: Diagnosis not present

## 2016-05-09 DIAGNOSIS — F321 Major depressive disorder, single episode, moderate: Secondary | ICD-10-CM | POA: Diagnosis not present

## 2016-05-09 NOTE — Progress Notes (Signed)
Comprehensive Clinical Assessment (CCA) Note  05/09/2016 Matthew Patrick [id number]  Visit Diagnosis:   No diagnosis found.    CCA Part One  Part One has been completed on paper by the patient.  (See scanned document in Chart Review)  CCA Part Two A  Intake/Chief Complaint:  CCA Intake With Chief Complaint CCA Part Two Date: 05/09/16 CCA Part Two Time: 1514 Chief Complaint/Presenting Problem: This is a 71 yr old, married, retired, Caucasian male, who was transitioned from [facility]; treatment for worsening depressive and anxiety symptoms with SI.  Discussed safety options with pt at length.  He is able to contract for safety.  Pt was admitted at [facility] from 04-23-16 until 04-29-16.  Pt denies HI or A/V hallucinations.  "My wife states I need to man up.  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  According to pt, the first treatment worked, but the other sixteen didn't.  States wife has assumed most of his care.  "She drives me everywhere, cooks, etc and she is burned out."  Triggers/Stressors:  1)  Strained finances:  due to medical bills.  States his insurance doesn't cover a lot of his medications.  Wife has a lot of health issues also.  Dog has a lot of stomach problems, so he goes to the vet frequently.  2)  Unresolved grief/loss issues:  Mother died last yr.  According to pt, that is where his savings came from and he's afraid he and wife will deplete that.  Pt has had two prior psychiatric admissions:  One recently at [facility] and [facility] (summer 2017).  Denies any prior suicide attempts or gestures.  New pt of  Stuart Hunt's, LCSW and Dr. Clovis Pu.  Family Hx:  Father (Anxiety & Depression).  Brother and Sister (mild depression).                                                                                                                                                    Patients Currently  Reported Symptoms/Problems: Excessive Anxiety, ruminating thoughts, decreased ADL's, anhedonia, poor concentration, poor memory, isolative, poor appetite (lost 10 lbs within  few months, irritable, sadness, low self-esteem, indecisiveness.                                    Collateral Involvement: Pt's wife is supportive. Individual's Strengths: Pt is motivated to get better, but is so depressed that he is unsure of himself. Individual's Preferences: Individual work [employer]: Enjoys cooking, Social worker. Initial Clinical Notes/Concerns: MH-IOP will provide pt with a structured environment and in hoping that he will learn at least one coping skill and apply it.  Mental Health  Symptoms Depression:  Depression: Change in energy/activity, Difficulty Concentrating, Fatigue, Irritability, Hopelessness, Sleep (too much or little), Tearfulness, Weight gain/loss  Mania:  Mania: N/A  Anxiety:   Anxiety: Worrying  Psychosis:  Psychosis: N/A  Trauma:  Trauma: N/A  Obsessions:  Obsessions: N/A  Compulsions:     Inattention:     Hyperactivity/Impulsivity:     Oppositional/Defiant Behaviors:  Oppositional/Defiant Behaviors: N/A  Borderline Personality:     Other Mood/Personality Symptoms:      Mental Status Exam Appearance and self-care  Stature:  Stature: Average  Weight:  Weight: Average weight  Clothing:  Clothing: Casual  Grooming:  Grooming: Normal  Cosmetic use:  Cosmetic Use: None  Posture/gait:  Posture/Gait: Normal  Motor activity:  Motor Activity: Restless  Sensorium  Attention:  Attention: Distractible  Concentration:  Concentration: Preoccupied  Orientation:  Orientation: X5  Recall/memory:  Recall/Memory: Defective in Remote  Affect and Mood  Affect:  Affect: Blunted  Mood:  Mood: Depressed  Relating  Eye contact:  Eye Contact: Normal  Facial expression:  Facial Expression: Sad, Anxious  Attitude toward examiner:  Attitude Toward Examiner: Dependent  Thought  and Language  Speech flow: Speech Flow: Normal  Thought content:  Thought Content: Appropriate to mood and circumstances  Preoccupation:     Hallucinations:     Organization:     Transport planner of Knowledge:  Fund of Knowledge: Average  Intelligence:  Intelligence: Average  Abstraction:  Abstraction: Psychologist, sport and exercise:  Judgement: Poor  Reality Testing:  Reality Testing: Distorted  Insight:  Insight: Poor  Decision Making:  Decision Making: Paralyzed  Social Functioning  Social Maturity:  Social Maturity: Isolates  Social Judgement:  Social Judgement: Normal  Stress  Stressors:  Stressors: Brewing technologist, Psychologist, forensic Ability:  Coping Ability: English as a second language teacher Deficits:     Supports:      Family and Psychosocial History: Family history Marital status: Married Number of Years Married: 5 What types of issues is patient dealing with in the relationship?: "My negativity"  Are you sexually active?: No What is your sexual orientation?: Heterosexual  Has your sexual activity been affected by drugs, alcohol, medication, or emotional stress?: NA Does patient have children?: Yes How many children?: 2 How is patient's relationship with their children?: 2 daughters; strained due to his depression.   Childhood History:  Childhood History By whom was/is the patient raised?: Both parents Additional childhood history information: Born in [place].  Father suffered with severe depression and anxiety.  Was eventually in a nursing home.  Elementary school was good.  States he struggled with depression beginning in high school. Description of patient's relationship with caregiver when they were a child: Good relatioship with mother, father was "absent" How were you disciplined when you got in trouble as a child/adolescent?: NA Does patient have siblings?: Yes Number of Siblings: 2 Description of patient's current relationship with siblings: Sister age 19 and brother age 43 Did  patient suffer any verbal/emotional/physical/sexual abuse as a child?: No Did patient suffer from severe childhood neglect?: No Has patient ever been sexually abused/assaulted/raped as an adolescent or adult?: No Was the patient ever a victim of a crime or a disaster?: No Witnessed domestic violence?: No Has patient been effected by domestic violence as an adult?: No  CCA Part Two B  Employment/Work Situation: Employment / Work Copywriter, 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 patient has a held a job?: 23 years  Where was the patient employed at that time?:  [employer] Has patient ever been in the [employer]?: No Has patient ever served in combat?: No Did You Receive Any Psychiatric Treatment/Services While in Passenger transport manager?: No Are There Guns or Other Weapons in [place]?: No Are These Psychologist, educational?:  (n/a)  Education: Education Did Teacher, adult education From [employer]?: Yes Did Physicist, medical?: Yes What Type of College Degree Do you Have?: BS Did [place]?: No What Was Your Major?: Business Management Did You Have An Individualized Education Program (IIEP): No Did You Have Any Difficulty At School?: No  Religion: Religion/Spirituality Are You A Religious Person?: Yes What is Your Religious Affiliation?: Presbyterian  Leisure/Recreation: Leisure / Recreation Leisure and Hobbies: "I use to enjoy cooking and Scientist, research (medical)"  Exercise/Diet: Exercise/Diet Do You Exercise?: No Have You Gained or Lost A Significant Amount of Weight in the Past Six Months?: Yes-Lost Number of Pounds Lost?: 10 Do You Follow a Special Diet?: No Do You Have Any Trouble Sleeping?: Yes Explanation of Sleeping Difficulties: Excessive worrying...ruminating thoughts  CCA Part Two C  Alcohol/Drug Use: Alcohol / Drug Use History of alcohol / drug use?: Yes Longest period of sobriety (when/how long): 3 weeks Negative Consequences of Use: Personal  relationships Substance #1 Name of Substance 1: ETOH 1 - Amount (size/oz): 7-8 glasses of wine 1 - Frequency: nightly 1 - Duration: 2017 1 - Last Use / Amount: summer 2017                    CCA Part Three  ASAM's:  Six Dimensions of Multidimensional Assessment  Dimension 1:  Acute Intoxication and/or Withdrawal Potential:     Dimension 2:  Biomedical Conditions and Complications:     Dimension 3:  Emotional, Behavioral, or Cognitive Conditions and Complications:     Dimension 4:  Readiness to Change:     Dimension 5:  Relapse, Continued use, or Continued Problem Potential:     Dimension 6:  Recovery/Living Environment:      Substance use Disorder (SUD)    Social Function:  Social Functioning Social Maturity: Isolates Social Judgement: Normal  Stress:  Stress Stressors: Brewing technologist, Money Coping Ability: Overwhelmed Patient Takes Medications The Way The Doctor Instructed?: Yes Priority Risk: High Risk  Risk Assessment- Self-Harm Potential: Risk Assessment For Self-Harm Potential Thoughts of Self-Harm: Vague current thoughts Method: No plan Availability of Means: No access/NA  Risk Assessment -Dangerous to Others Potential: Risk Assessment For Dangerous to Others Potential Method: No Plan Availability of Means: No access or NA Intent: Vague intent or NA Notification Required: No need or identified person  DSM5 Diagnoses: Patient Active Problem List   Diagnosis Date Noted  . Generalized weakness 09/22/2015  . Depression 09/22/2015  . Anemia 09/22/2015  . Obsessive compulsive disorder 09/22/2015  . Generalized anxiety disorder 09/22/2015  . SIADH (syndrome of inappropriate ADH production) ([place]) 09/21/2015  . Hyponatremia 09/20/2015  . Severe recurrent major depression with psychotic features ([place]) 09/19/2015  . Major depressive disorder, recurrent severe without psychotic features ([place]) 09/17/2015  . Suicidal ideation 09/17/2015  . Cough 01/19/2015  .  Restless leg 01/19/2015  . COLD (chronic obstructive lung disease) ([place]) 05/22/2014  . Lung nodule < 6cm on CT 05/22/2014  . OSA (obstructive sleep apnea) 07/11/2013  . Lung nodule 05/11/2013  . Nocturnal hypoxemia 05/11/2013  . Obstructive lung disease ([place]) 04/17/2013  . Chronic cough 04/17/2013  . Syncope 03/25/2011  . Hypertension 03/25/2011  . Hypokalemia 03/25/2011  . Asthma 03/25/2011  .  Sciatica 03/25/2011  . SOB (shortness of breath) 03/25/2011    Patient Centered Plan: Patient is on the following Treatment Plan(s):  Anxiety and Depression  Recommendations for Services/Supports/Treatments: Recommendations for Services/Supports/Treatments Recommendations For Services/Supports/Treatments: IOP (Intensive Outpatient Program)  Treatment Plan Summary:  Orient pt to MH-IOP.  Pt will participate in group therapy and psycho-educational groups on a daily basis.  Encouraged support groups.  F/U with Dr. Clovis Pu and Oneida Arenas, LCSW.    Referrals to Alternative Service(s): Referred to Alternative Service(s):   Place:   Date:   Time:    Referred to Alternative Service(s):   Place:   Date:   Time:    Referred to Alternative Service(s):   Place:   Date:   Time:    Referred to Alternative Service(s):   Place:   Date:   Time:     CLARK, RITA, M.Ed, CNA

## 2016-05-09 NOTE — Progress Notes (Signed)
Psychiatric Initial Adult Assessment   Patient Identification: Quaveon Zavada MRN:  [id number] Date of Evaluation:  05/09/2016 Referral Source: [facility] inpatient psychiatry Chief Complaint:remains depressedVisit Diagnosis:  Severe major depression, recurrent without psychotic features History of Present Illness:  Mr Hashimi was evaluated by me in Nov 2017 for [place] therapy after ECT that wa not that helpful.  Since that time he has been admitted to the inpatient unit at [facility] and says that stay was also not that helpful.  He has tried all antidepressants he believes without success.  He has been depressed all his life with recurrent episodes but for the last few years he has remained unrelentingly depressed to the point of wishing to be dead.  Says he is not suicidal today and would not try to kill himself because of his family.  No longer wants to live and believes there is no hope for him.  Financial issues are prominent.  Has a pension but social security was reduced because of the pension.  Medical bills are piling up.  They buy organic which is expensive and their dog has medical problems that are expensive.  He worries his wife spends too much without saving what little they have.  [place] and hurt his already impaired back so it is torture getting dressed or walking the dog or doing much of anything.  Declared bankruptcy and his first wife left before he remarried 5 years ago.  Worried that his wife cannot take all the problems he brings to the marriage.  Has no hobbies or interests and has avoided church activities because he says he cannot remember names or anything. Not interested in groups but feels he has no choice.  Do not know why he did not pursue [place].  Associated Signs/Symptoms: Depression Symptoms:  depressed mood, anhedonia, hypersomnia, fatigue, feelings of worthlessness/guilt, difficulty concentrating, hopelessness, impaired memory, recurrent thoughts of death, anxiety, loss of  energy/fatigue, weight loss, decreased appetite, (Hypo) Manic Symptoms:  Irritable Mood, Anxiety Symptoms:  Excessive Worry, Psychotic Symptoms:  none PTSD Symptoms: Negative  Past Psychiatric History: long term outpatient therapy with recent inpatient stay and 2 rounds of ECT  Previous Psychotropic Medications: Yes   Substance Abuse History in the last 12 months:  No.  Consequences of Substance Abuse: Negative  Past Medical History:  Past Medical History:  Diagnosis Date  . Anxiety   . Asthma   . Depression   . Hyperlipemia   . Hypertension   . Sleep apnea    has c-pap     Past Surgical History:  Procedure Laterality Date  . 2D Echocardiogram  03/27/11  . CATARACT EXTRACTION    . Neck skin tag biopsy    . TONSILLECTOMY AND ADENOIDECTOMY      Family Psychiatric History: father and both daughters suffer depression with father having a drinking problem as well  Family History:  Family History  Problem Relation Age of Onset  . Heart disease Father   . Emphysema Mother   . Allergies Mother     Social History:   Social History   Social History  . Marital status: Significant Other    Spouse name: N/A  . Number of children: N/A  . Years of education: N/A   Occupational History  . retired    Social History Main Topics  . Smoking status: Former Smoker    Packs/day: 1.00    Years: 5.00    Types: Cigarettes    Quit date: 04/25/1967  . Smokeless tobacco: Never Used  .  Alcohol use 4.2 oz/week    7 Glasses of wine per week     Comment: 2-4 glasses of wine per night, none in 3 weeks  . Drug use: No  . Sexual activity: No   Other Topics Concern  . Not on file   Social History Narrative  . No narrative on file    Additional Social History: none  Allergies:  No Known Allergies  Metabolic Disorder Labs: Lab Results  Component Value Date   HGBA1C 5.7 09/17/2015   MPG 117 (H) 03/26/2011   Lab Results  Component Value Date   PROLACTIN 18.6 (H)  09/17/2015   Lab Results  Component Value Date   CHOL 173 09/17/2015   TRIG 140 09/17/2015   HDL 58 09/17/2015   CHOLHDL 3.0 09/17/2015   VLDL 28 09/17/2015   LDLCALC 87 09/17/2015   LDLCALC 43 03/26/2011   LDLCALC 46 03/26/2011     Current Medications: Current Outpatient Prescriptions  Medication Sig Dispense Refill  . albuterol (PROVENTIL HFA;VENTOLIN HFA) 108 (90 Base) MCG/ACT inhaler Inhale 1 puff into the lungs every 6 (six) hours as needed for wheezing or shortness of breath.    Marland Kitchen amantadine (SYMMETREL) 100 MG capsule Take 100 mg by mouth 2 (two) times daily.    Marland Kitchen aspirin 81 MG tablet Take 81 mg by mouth daily.    . B Complex-C (B-COMPLEX WITH VITAMIN C) tablet Take 1 tablet by mouth daily.    . feeding supplement (BOOST / RESOURCE BREEZE) LIQD Take 1 Container by mouth 2 (two) times daily between meals. (Patient not taking: Reported on 04/22/2016) 60 Container 5  . fluticasone (FLONASE) 50 MCG/ACT nasal spray Place 2 sprays into both nostrils daily.    Marland Kitchen gabapentin (NEURONTIN) 300 MG capsule Take 900 mg by mouth at bedtime.     Marland Kitchen ibuprofen (ADVIL,MOTRIN) 200 MG tablet Take 600 mg by mouth every 6 (six) hours as needed.    . lamoTRIgine (LAMICTAL) 25 MG tablet Take 25 mg by mouth daily.    Marland Kitchen LORazepam (ATIVAN) 0.5 MG tablet Take 0.5 tablets (0.25 mg total) by mouth 4 (four) times daily. (Patient not taking: Reported on 04/22/2016) 30 tablet 0  . LORazepam (ATIVAN) 1 MG tablet Take 1 mg by mouth 4 (four) times daily as needed.    . mirtazapine (REMERON) 15 MG tablet Take 15 mg by mouth at bedtime.    . ondansetron (ZOFRAN) 4 MG tablet Take 4 mg by mouth daily.    [name] Glycol-Propyl Glycol (SYSTANE ULTRA OP) Apply 1 drop to eye.    Marland Kitchen QUEtiapine (SEROQUEL) 50 MG tablet Take 3 tablets (150 mg total) by mouth at bedtime. (Patient not taking: Reported on 04/22/2016) 90 tablet 0  . traMADol (ULTRAM) 50 MG tablet Take 1 tablet (50 mg total) by mouth every 6 (six) hours as  needed. (Patient not taking: Reported on 04/22/2016) 15 tablet 0  . vortioxetine HBr (TRINTELLIX) 20 MG TABS Take 20 mg by mouth daily.     No current facility-administered medications for this visit.     Neurologic: Headache: Negative Seizure: Negative Paresthesias:Negative  Musculoskeletal: Strength & Muscle Tone: within normal limits Gait & Station: normal Patient leans: N/A  Psychiatric Specialty Exam: ROS  There were no vitals taken for this visit.There is no height or weight on file to calculate BMI.  General Appearance: Well Groomed  Eye Contact:  Good  Speech:  Clear and Coherent  Volume:  Normal  Mood:  Anxious and Depressed  Affect:  Congruent  Thought Process:  Coherent and Goal Directed  Orientation:  Full (Time, Place, and Person)  Thought Content:  Logical  Suicidal Thoughts:  No  Homicidal Thoughts:  No  Memory:  Immediate;   Good Recent;   Good Remote;   Good  Judgement:  Impaired  Insight:  Shallow  Psychomotor Activity:  Decreased  Concentration:  Concentration: Poor and Attention Span: Poor  Recall:  Poor  Fund of Knowledge:Good  Language: Good  Akathisia:  Negative  Handed:  Right  AIMS (if indicated):  0  Assets:  Communication Skills Desire for Improvement Financial Resources/Insurance Housing Social Support Talents/Skills Transportation Vocational/Educational  ADL's:  Intact  Cognition: WNL  Sleep:  poor    Treatment Plan Summary: Admit ot IOP with daily group therapy   Donnelly Angelica, MD 1/16/20181:15 PM

## 2016-05-10 ENCOUNTER — Other Ambulatory Visit (HOSPITAL_COMMUNITY): Payer: Medicare Other

## 2016-05-11 ENCOUNTER — Other Ambulatory Visit (HOSPITAL_COMMUNITY): Payer: Medicare Other

## 2016-05-12 ENCOUNTER — Other Ambulatory Visit (HOSPITAL_COMMUNITY): Payer: Medicare Other | Admitting: Psychiatry

## 2016-05-12 DIAGNOSIS — R3914 Feeling of incomplete bladder emptying: Secondary | ICD-10-CM | POA: Diagnosis not present

## 2016-05-12 DIAGNOSIS — R350 Frequency of micturition: Secondary | ICD-10-CM | POA: Diagnosis not present

## 2016-05-12 DIAGNOSIS — N3941 Urge incontinence: Secondary | ICD-10-CM | POA: Diagnosis not present

## 2016-05-12 DIAGNOSIS — F332 Major depressive disorder, recurrent severe without psychotic features: Secondary | ICD-10-CM

## 2016-05-12 NOTE — Progress Notes (Signed)
    Daily Group Progress Note  Program: IOP  Group Time: [id number]  Participation Level: Active  Behavioral Response: Sharing, Rigid, Rationalizing, Resistant and Attention-Seeking   Type of Therapy:  Group Therapy  Summary of Progress: Pt arrived about 45 minutes after the group had started.  Writer had previously spoken to him on the phone before group and he stated that he wasn't attending; but wife brought him on in.  Pt was very negative and ruminating in his thoughts.  Stating that he can't be helped.  Continuously asked writer if she could fix him. Discussed hospitalization at [facility], maintenance ECT, or [place] with pt at length.  Pt declined.  He is fixated on his finances; stating that he can't afford anything. Writer remained firm with pt throughout the group.  Challenged pt to try to do one thing for himself this weekend.  He is expecting his daughter and granddaughter to visit over the weekend.  He is quite anxious about her coming and bringing her two dogs.  States he is not looking forward to it.      Carlis Abbott, RITA, M.Ed, CNA

## 2016-05-13 DIAGNOSIS — F332 Major depressive disorder, recurrent severe without psychotic features: Secondary | ICD-10-CM | POA: Diagnosis present

## 2016-05-13 DIAGNOSIS — E559 Vitamin D deficiency, unspecified: Secondary | ICD-10-CM | POA: Diagnosis present

## 2016-05-13 DIAGNOSIS — F333 Major depressive disorder, recurrent, severe with psychotic symptoms: Secondary | ICD-10-CM | POA: Diagnosis not present

## 2016-05-13 DIAGNOSIS — R45851 Suicidal ideations: Secondary | ICD-10-CM | POA: Diagnosis present

## 2016-05-13 DIAGNOSIS — N401 Enlarged prostate with lower urinary tract symptoms: Secondary | ICD-10-CM | POA: Diagnosis present

## 2016-05-13 DIAGNOSIS — G3184 Mild cognitive impairment, so stated: Secondary | ICD-10-CM | POA: Diagnosis present

## 2016-05-13 DIAGNOSIS — I10 Essential (primary) hypertension: Secondary | ICD-10-CM | POA: Diagnosis present

## 2016-05-15 ENCOUNTER — Telehealth (HOSPITAL_COMMUNITY): Payer: Self-pay | Admitting: Psychiatry

## 2016-05-15 ENCOUNTER — Other Ambulatory Visit (HOSPITAL_COMMUNITY): Payer: Medicare Other | Admitting: Psychiatry

## 2016-05-15 NOTE — Progress Notes (Signed)
    Daily Group Progress Note  Program: IOP Group Time: 9:00-12:00   Participation Level:  minimal   Behavioral Response:  flat   Type of Therapy:   group therapy   Summary of Progress:  This was patients first day in group. 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 Pt also shared that he is unhappy with how many medications he takes. When the group finished pt seemed disoriented about what to do, and overwhelmed by the newness of his current situation. He also expressed a sense of hopelessness about his current situation.     Nancie Neas, LPC

## 2016-05-15 NOTE — Telephone Encounter (Signed)
D:  Upon arrival to [place] this a.m; pt's wife Jeani Hawking) had called and left vm stating that Kendrell was hospitalized at [facility] over the weekend.  She is requesting that pt return to [place] after hospitalization.  A:  Will inform treatment team.  Once [place] or pt's wife calls re: pt re-admitting to [facility]; Probation officer will strongly recommend transitioning to [facility]'s Partial Program before returning here d/t vegetative state.

## 2016-05-16 ENCOUNTER — Other Ambulatory Visit (HOSPITAL_COMMUNITY): Payer: Medicare Other

## 2016-05-17 ENCOUNTER — Other Ambulatory Visit (HOSPITAL_COMMUNITY): Payer: Medicare Other

## 2016-05-18 ENCOUNTER — Other Ambulatory Visit (HOSPITAL_COMMUNITY): Payer: Medicare Other

## 2016-05-19 ENCOUNTER — Other Ambulatory Visit (HOSPITAL_COMMUNITY): Payer: Medicare Other

## 2016-05-20 ENCOUNTER — Encounter (HOSPITAL_COMMUNITY): Payer: Self-pay | Admitting: Emergency Medicine

## 2016-05-20 ENCOUNTER — Emergency Department (HOSPITAL_COMMUNITY)
Admission: EM | Admit: 2016-05-20 | Discharge: 2016-05-20 | Disposition: A | Discharge status: Home / Self Care | Payer: Medicare Other | Attending: Emergency Medicine | Admitting: Emergency Medicine

## 2016-05-20 DIAGNOSIS — Z79899 Other long term (current) drug therapy: Secondary | ICD-10-CM | POA: Insufficient documentation

## 2016-05-20 DIAGNOSIS — J45909 Unspecified asthma, uncomplicated: Secondary | ICD-10-CM | POA: Insufficient documentation

## 2016-05-20 DIAGNOSIS — Z7982 Long term (current) use of aspirin: Secondary | ICD-10-CM | POA: Insufficient documentation

## 2016-05-20 DIAGNOSIS — I10 Essential (primary) hypertension: Secondary | ICD-10-CM | POA: Insufficient documentation

## 2016-05-20 DIAGNOSIS — F29 Unspecified psychosis not due to a substance or known physiological condition: Secondary | ICD-10-CM | POA: Diagnosis not present

## 2016-05-20 DIAGNOSIS — Z87891 Personal history of nicotine dependence: Secondary | ICD-10-CM | POA: Insufficient documentation

## 2016-05-20 DIAGNOSIS — F331 Major depressive disorder, recurrent, moderate: Secondary | ICD-10-CM

## 2016-05-20 DIAGNOSIS — R45851 Suicidal ideations: Secondary | ICD-10-CM | POA: Diagnosis not present

## 2016-05-20 DIAGNOSIS — F339 Major depressive disorder, recurrent, unspecified: Secondary | ICD-10-CM | POA: Diagnosis not present

## 2016-05-20 LAB — COMPREHENSIVE METABOLIC PANEL
ALK PHOS: 91 U/L (ref 38–126)
ALT: 24 U/L (ref 17–63)
ANION GAP: 4 — AB (ref 5–15)
AST: 20 U/L (ref 15–41)
Albumin: 4.3 g/dL (ref 3.5–5.0)
BILIRUBIN TOTAL: 0.8 mg/dL (ref 0.3–1.2)
BUN: 31 mg/dL — ABNORMAL HIGH (ref 6–20)
CALCIUM: 9.4 mg/dL (ref 8.9–10.3)
CO2: 27 mmol/L (ref 22–32)
CREATININE: 0.95 mg/dL (ref 0.61–1.24)
Chloride: 107 mmol/L (ref 101–111)
GFR calc non Af Amer: >60 mL/min (ref >60)
GLUCOSE: 94 mg/dL (ref 65–99)
Potassium: 3.8 mmol/L (ref 3.5–5.1)
SODIUM: 138 mmol/L (ref 135–145)
TOTAL PROTEIN: 6.9 g/dL (ref 6.5–8.1)

## 2016-05-20 LAB — CBC
HEMATOCRIT: 36.6 % — AB (ref 39.0–52.0)
HEMOGLOBIN: 12.6 g/dL — AB (ref 13.0–17.0)
MCH: 32 pg (ref 26.0–34.0)
MCHC: 34.4 g/dL (ref 30.0–36.0)
MCV: 92.9 fL (ref 78.0–100.0)
Platelets: 236 10*3/uL (ref 150–400)
RBC: 3.94 MIL/uL — ABNORMAL LOW (ref 4.22–5.81)
RDW: 11.9 % (ref 11.5–15.5)
WBC: 7 10*3/uL (ref 4.0–10.5)

## 2016-05-20 LAB — ACETAMINOPHEN LEVEL: Acetaminophen (Tylenol), Serum: <10 ug/mL — ABNORMAL LOW (ref 10–30)

## 2016-05-20 LAB — ETHANOL: Alcohol, Ethyl (B): <5 mg/dL (ref <5)

## 2016-05-20 LAB — SALICYLATE LEVEL

## 2016-05-20 NOTE — BH Assessment (Addendum)
Assessment Note  Matthew Patrick is an 71 y.o. male presenting to [facility] voluntarily due to a conflict with his wife this morning where he became upset and stated "I want to die" "or something like that." Patient states that he has been under marital stress and he was anxious about  His wife leaving to go to church. Patient states that he was discharged from [facility] yesterday and went to the [place] where he was set up for outpatient appointments to see a neurologist and "run other tests" and he states that the appointments are sometime next week. Patient states that he thought he would be admitted to the [place] "but they just referred me to other places for testing a such." Patient denies suicidal ideations and states that he said that he wanted to die this morning "because I freaked out." Patient denies suicidal ideations with no intent or plan. Patient states that he was not suicidal this morning but he did not want his wife to leave him at home. Patient denies history of attempts and states "I would never do anything like that." Patient denies homicidal ideations and history of aggression. Patient denies pending charges and upcoming court dates. Patient denies visual and auditory hallucinations and does not appear to be responding to internal stimuli. Patient denies use of drugs and alcohol stating that he previously would drink wine at times "but not anymore." Patient UDS not collected and BAL <5.   Patient was calm and cooperative during the assessment. Patient is alert and oriented x4. Patient states that he sees Dr. Clovis Pu at [facility] and has had about two appointments and has an appointment scheduled for Tuesday. Patients wife states that the Dr. Clovis Pu recommended taking the patient back to [facility] but stated that she did not want the patient to return to [place]. Patient states that he has a therapist, Seward Grater. as well but cannot recall his last appointment. Patient  states that he cannot afford the co-pays to regularly and is hopeful to have the appointments switched to the [place] once the testing is done. Patient states that he has had ECT treatments in the past and was assessed for [place] with Dr. Lovena Le and states that he and his wife decided not to move forward with [place] treatments. Patient states that he lives at home with his wife and feels that it is a safe place. Patient states that he feels "imitated by life" when his wife is not around which may be leading to some anxiety about being home alone. Patient states that he and his wife also argue often about spending money and states that he is retired and on a fixed income. Patient states that he feels depressed and endorses symptoms of; isolation, fatigue, anhedonia, and feeling helpless at times. Patient states that he also feels that may be attributed to medical problems stating that he is "dehydrated" and that he sweats often. Patient states that he depends on his wife for most of bathing and feeding and stated that she is able to assist him when possible. Patient denies history of trauma/abuse.   Consulted with Waylan Boga, DNP who recommends patient be discharged to outpatient provider, EDP Dr. Laneta Simmers is in agreement.  [facility]   Diagnosis: Adjustment disorder, With mixed anxiety and depressed mood  Past Medical History:  Past Medical History:  Diagnosis Date  . Anxiety   . Asthma   . Depression   . Hyperlipemia   .  Hypertension   . Sleep apnea    has c-pap     Past Surgical History:  Procedure Laterality Date  . 2D Echocardiogram  03/27/11  . CATARACT EXTRACTION    . Neck skin tag biopsy    . TONSILLECTOMY AND ADENOIDECTOMY      Family History:  Family History  Problem Relation Age of Onset  . Depression Brother   . Depression Sister   . Heart disease Father   . Anxiety disorder Father   . Depression Father   . Emphysema Mother   . Allergies Mother      Social History:  reports that he quit smoking about 49 years ago. His smoking use included Cigarettes. He has a 5.00 pack-year smoking history. He has never used smokeless tobacco. He reports that he does not drink alcohol or use drugs.  Additional Social History:     CIWA: CIWA-Ar BP: 130/62 Pulse Rate: 87 COWS:    Allergies: No Known Allergies  Home Medications:  (Not in a hospital admission)  OB/GYN Status:  No LMP for male patient.  General Assessment Data [facility] TTS Assessment: In system Is this a Tele or Face-to-Face Assessment?: Face-to-Face Is this an Initial Assessment or a Re-assessment for this encounter?: Initial Assessment Marital status: Married (5 years ago ) Is patient pregnant?: No Pregnancy Status: No Living Arrangements: Spouse/significant other Can pt return to current living arrangement?: Yes Admission Status: Voluntary Is patient capable of signing voluntary admission?: Yes Referral Source: Self/Family/Friend     Crisis Care Plan Living Arrangements: Spouse/significant other Name of Psychiatrist: Dr. Clovis Pu  Name of Therapist: Tressie Ellis   Education Status Is patient currently in school?: No Highest grade of school patient has completed: BA  Risk to self with the past 6 months Suicidal Ideation: No Has patient been a risk to self within the past 6 months prior to admission? : No Suicidal Intent: No Has patient had any suicidal intent within the past 6 months prior to admission? : No Is patient at risk for suicide?: No Suicidal Plan?: No Has patient had any suicidal plan within the past 6 months prior to admission? : No Access to Means: No What has been your use of drugs/alcohol within the last 12 months?: " I used to have wine, but not now" Previous Attempts/Gestures: No How many times?: 0 Other Self Harm Risks: None Triggers for Past Attempts: None known Intentional Self Injurious Behavior: None Family Suicide  History: No Recent stressful life event(s): Conflict (Comment) (with wife) Persecutory voices/beliefs?: No Depression: Yes Depression Symptoms: Isolating, Fatigue, Loss of interest in usual pleasures, Feeling worthless/self pity Substance abuse history and/or treatment for substance abuse?: No Suicide prevention information given to non-admitted patients: Not applicable  Risk to Others within the past 6 months Homicidal Ideation: No Does patient have any lifetime risk of violence toward others beyond the six months prior to admission? : No Thoughts of Harm to Others: No Current Homicidal Intent: No Current Homicidal Plan: No Access to Homicidal Means: No Identified Victim: Denies History of harm to others?: No Assessment of Violence: None Noted Violent Behavior Description: Denies Does patient have access to weapons?: No Criminal Charges Pending?: No Does patient have a court date: No Is patient on probation?: No  Psychosis Hallucinations: None noted Delusions: None noted  Mental Status Report Appearance/Hygiene: In scrubs Eye Contact: Good Motor Activity: Tremors Speech: Logical/coherent Level of Consciousness: Alert Mood: Pleasant Affect: Appropriate to circumstance Anxiety Level: None Thought Processes: Coherent,  Relevant Judgement: Unimpaired Orientation: Person, Place, Time, Situation, Appropriate for developmental age Obsessive Compulsive Thoughts/Behaviors: None  Cognitive Functioning Concentration: Decreased Memory: Recent Intact, Remote Intact IQ: Average Insight: Fair Impulse Control: Fair Appetite: Fair Sleep: Decreased Vegetative Symptoms: None  ADLScreening [facility] Assessment Services) Patient's cognitive ability adequate to safely complete daily activities?: Yes Patient able to express need for assistance with ADLs?: Yes Independently performs ADLs?: No  Prior Inpatient Therapy Prior Inpatient Therapy: Yes Prior Therapy Dates: 05/20/2016 Prior  Therapy Facilty/Provider(s): [facility] Reason for Treatment: Depression  Prior Outpatient Therapy Prior Outpatient Therapy: Yes Prior Therapy Dates: Present Prior Therapy Facilty/Provider(s): [facility] Reason for Treatment: Depression Does patient have an ACCT team?: No Does patient have Intensive In-House Services?  : No Does patient have [facility] services? : No Does patient have P4CC services?: No  ADL Screening (condition at time of admission) Patient's cognitive ability adequate to safely complete daily activities?: Yes Is the patient deaf or have difficulty hearing?: No Does the patient have difficulty seeing, even when wearing glasses/contacts?: No Does the patient have difficulty concentrating, remembering, or making decisions?: No Patient able to express need for assistance with ADLs?: Yes Does the patient have difficulty dressing or bathing?: Yes Independently performs ADLs?: No Does the patient have difficulty walking or climbing stairs?: Yes Weakness of Legs: Both Weakness of Arms/Hands: Both  Home Assistive Devices/Equipment Home Assistive Devices/Equipment: None    Abuse/Neglect Assessment (Assessment to be complete while patient is alone) Physical Abuse: Denies Verbal Abuse: Denies Sexual Abuse: Denies Exploitation of patient/patient's resources: Denies Self-Neglect: Denies Values / Beliefs Cultural Requests During Hospitalization: None Spiritual Requests During Hospitalization: None   Advance Directives (For Healthcare) Does Patient Have a Medical Advance Directive?: No Would patient like information on creating a medical advance directive?: No - Patient declined    Additional Information 1:1 In Past 12 Months?: No CIRT Risk: No Elopement Risk: No Does patient have medical clearance?: Yes     Disposition:  Disposition Initial Assessment Completed for this Encounter: Yes Disposition of Patient: Outpatient treatment, Referred to (referred to  outpatient provider Dr. Clovis Pu) Type of inpatient treatment program: Adult Type of outpatient treatment: Adult Patient referred to: Other (Comment)  On Site Evaluation by:   Reviewed with Physician:    Billyjack Trompeter 05/20/2016 5:11 PM

## 2016-05-20 NOTE — ED Notes (Signed)
Bed: [id number] Expected date:  Expected time:  Means of arrival:  Comments: 71 yo SI

## 2016-05-20 NOTE — ED Notes (Signed)
Pt visiting with his pastor, no acute distress

## 2016-05-20 NOTE — Progress Notes (Signed)
CSW spoke with patient and patient's wife regarding local mental health resources. CSW provided patient's wife with local mental health resources handout. Patient's wife reported that patient has an appointment this upcoming Tuesday with a psychiatrist but is unsure if they will be able to keep the appointment. CSW encouraged patient and patient's wife to keep the appointment to begin the process of outpatient treatment.

## 2016-05-20 NOTE — ED Provider Notes (Signed)
[facility] DEPT Provider Note   CSN: [id number] Arrival date & time: 05/20/16  1112     History   Chief Complaint Chief Complaint  Patient presents with  . Suicidal    HPI Matthew Patrick is a 71 y.o. male.  The history is provided by the patient and the spouse.  Mental Health Problem  Presenting symptoms: agitation, depression, suicidal thoughts and suicidal threats   Presenting symptoms: no suicide attempt   Patient accompanied by: wife. Degree of incapacity (severity):  Moderate Onset quality:  Gradual Duration:  1 day (Since being released from [facility]) Timing:  Constant Progression:  Unchanged Chronicity:  Chronic Context: medication and stressful life event ([facility] yesterday )   Context: not noncompliant and not recent medication change   Treatment compliance:  All of the time Relieved by:  Nothing Worsened by:  Nothing Ineffective treatments:  None tried Associated symptoms: anhedonia, distractible, feelings of worthlessness and irritability   Risk factors: hx of mental illness     Past Medical History:  Diagnosis Date  . Anxiety   . Asthma   . Depression   . Hyperlipemia   . Hypertension   . Sleep apnea    has c-pap     Patient Active Problem List   Diagnosis Date Noted  . Generalized weakness 09/22/2015  . Depression 09/22/2015  . Anemia 09/22/2015  . Obsessive compulsive disorder 09/22/2015  . Generalized anxiety disorder 09/22/2015  . SIADH (syndrome of inappropriate ADH production) ([place]) 09/21/2015  . Hyponatremia 09/20/2015  . Severe recurrent major depression with psychotic features ([place]) 09/19/2015  . Major depressive disorder, recurrent severe without psychotic features ([place]) 09/17/2015  . Suicidal ideation 09/17/2015  . Cough 01/19/2015  . Restless leg 01/19/2015  . COLD (chronic obstructive lung disease) ([place]) 05/22/2014  . Lung nodule < 6cm on CT 05/22/2014  . OSA (obstructive sleep apnea) 07/11/2013  .  Lung nodule 05/11/2013  . Nocturnal hypoxemia 05/11/2013  . Obstructive lung disease ([place]) 04/17/2013  . Chronic cough 04/17/2013  . Syncope 03/25/2011  . Hypertension 03/25/2011  . Hypokalemia 03/25/2011  . Asthma 03/25/2011  . Sciatica 03/25/2011  . SOB (shortness of breath) 03/25/2011    Past Surgical History:  Procedure Laterality Date  . 2D Echocardiogram  03/27/11  . CATARACT EXTRACTION    . Neck skin tag biopsy    . TONSILLECTOMY AND ADENOIDECTOMY         Home Medications    Prior to Admission medications   Medication Sig Start Date End Date Taking? Authorizing Provider  albuterol (PROVENTIL HFA;VENTOLIN HFA) 108 (90 Base) MCG/ACT inhaler Inhale 1 puff into the lungs every 6 (six) hours as needed for wheezing or shortness of breath.    Historical Provider, MD  amantadine (SYMMETREL) 100 MG capsule Take 100 mg by mouth 2 (two) times daily.    Historical Provider, MD  aspirin 81 MG tablet Take 81 mg by mouth daily.    Historical Provider, MD  B Complex-C (B-COMPLEX WITH VITAMIN C) tablet Take 1 tablet by mouth daily.    Historical Provider, MD  feeding supplement (BOOST / RESOURCE BREEZE) LIQD Take 1 Container by mouth 2 (two) times daily between meals. 09/22/15   Theodoro Grist, MD  fluticasone (FLONASE) 50 MCG/ACT nasal spray Place 2 sprays into both nostrils daily.    Historical Provider, MD  gabapentin (NEURONTIN) 300 MG capsule Take 900 mg by mouth at bedtime.     Historical Provider, MD  ibuprofen (ADVIL,MOTRIN) 200  MG tablet Take 600 mg by mouth every 6 (six) hours as needed.    Historical Provider, MD  lamoTRIgine (LAMICTAL) 25 MG tablet Take 25 mg by mouth daily. 04/20/16   Historical Provider, MD  LORazepam (ATIVAN) 0.5 MG tablet Take 0.5 tablets (0.25 mg total) by mouth 4 (four) times daily. 09/22/15   Theodoro Grist, MD  LORazepam (ATIVAN) 1 MG tablet Take 1 mg by mouth 4 (four) times daily as needed. 03/28/16   Historical Provider, MD  mirtazapine (REMERON) 15 MG  tablet Take 15 mg by mouth at bedtime. 04/20/16   Historical Provider, MD  ondansetron (ZOFRAN) 4 MG tablet Take 4 mg by mouth daily. 02/03/16   Historical Provider, MD  Polyethyl Glycol-Propyl Glycol (SYSTANE ULTRA OP) Apply 1 drop to eye.    Historical Provider, MD  QUEtiapine (SEROQUEL) 50 MG tablet Take 3 tablets (150 mg total) by mouth at bedtime. 09/24/15   Gonzella Lex, MD  traMADol (ULTRAM) 50 MG tablet Take 1 tablet (50 mg total) by mouth every 6 (six) hours as needed. 04/10/16   Margette Fast, MD  zolpidem (AMBIEN) 10 MG tablet Take 10 mg by mouth at bedtime as needed for sleep.    Historical Provider, MD    Family History Family History  Problem Relation Age of Onset  . Depression Brother   . Depression Sister   . Heart disease Father   . Anxiety disorder Father   . Depression Father   . Emphysema Mother   . Allergies Mother     Social History Social History  Substance Use Topics  . Smoking status: Former Smoker    Packs/day: 1.00    Years: 5.00    Types: Cigarettes    Quit date: 04/25/1967  . Smokeless tobacco: Never Used  . Alcohol use No     Allergies   Patient has no known allergies.   Review of Systems Review of Systems  Constitutional: Positive for irritability.  Psychiatric/Behavioral: Positive for agitation and suicidal ideas.  All other systems reviewed and are negative.    Physical Exam Updated Vital Signs BP 130/62 (BP Location: Left Arm)   Pulse 87   Temp 97.7 F (36.5 C) (Oral)   Resp 16   SpO2 100%   Physical Exam  Constitutional: He is oriented to person, place, and time. He appears well-developed and well-nourished. No distress.  HENT:  Head: Normocephalic and atraumatic.  Nose: Nose normal.  Eyes: Conjunctivae are normal.  Neck: Neck supple. No tracheal deviation present.  Cardiovascular: Normal rate and regular rhythm.   Pulmonary/Chest: Effort normal. No respiratory distress.  Abdominal: Soft. He exhibits no distension.    Neurological: He is alert and oriented to person, place, and time.  Skin: Skin is warm and dry.  Psychiatric: His speech is normal. He is withdrawn. He is not agitated and not actively hallucinating. Thought content is not delusional. Cognition and memory are impaired (slightly tangential thinking, looking to spouse often during exam). He exhibits a depressed mood. He expresses no homicidal and no suicidal ideation. He expresses no suicidal plans and no homicidal plans.     ED Treatments / Results  Labs (all labs ordered are listed, but only abnormal results are displayed) Labs Reviewed  COMPREHENSIVE METABOLIC PANEL - Abnormal; Notable for the following:       Result Value   BUN 31 (*)    Anion gap 4 (*)    All other components within normal limits  ACETAMINOPHEN LEVEL - Abnormal;  Notable for the following:    Acetaminophen (Tylenol), Serum <10 (*)    All other components within normal limits  CBC - Abnormal; Notable for the following:    RBC 3.94 (*)    Hemoglobin 12.6 (*)    HCT 36.6 (*)    All other components within normal limits  ETHANOL  SALICYLATE LEVEL  RAPID URINE DRUG SCREEN, HOSP PERFORMED    EKG  EKG Interpretation None       Radiology No results found.  Procedures Procedures (including critical care time)  Medications Ordered in ED Medications - No data to display   Initial Impression / Assessment and Plan / ED Course  I have reviewed the triage vital signs and the nursing notes.  Pertinent labs & imaging results that were available during my care of the patient were reviewed by me and considered in my medical decision making (see chart for details).     71 y.o. male presents with Threatening suicidal behavior since this morning. He was released from [facility] yesterday and had medication changes being followed by his primary psychiatry team. When he got home he had difficult adjusting and became upset with his wife, stated that he felt like  dying because he cannot handle the stress and she called to have him retained and brought for emergent medical vibration.  Patient denies being suicidal currently, states that he said that he wanted to be dead earlier in the day but does not feel that way currently and at no point endorsed active suicidal thoughts. He denies any previous attempts of suicide and does not have a plan. Due to the chronic nature of his depression symptoms and the apparent difficulty adjusting back to home life, TTS was consulted to admit the patient for safety. The case was staffed with psychiatry team who agreed with discharge as patient does not appear to pose any significant imminent threat to himself or other people. Wife is concerned because she wanted the patient placed in the [facility] psychiatric care. I explained that they do not accept transfers for psychiatric purposes during the weekend hours and he had no emergent indication for IVC. I explained that she could drive him to the [facility] since he has been medically and psychiatrically cleared but she was concerned that she could not take care of her dog at home and requested that he stay tonight. I explained that we cannot hold people in the emergency department against their will to help with planning of affairs.  It was explained at length that our main concern was the patient's personal safety and since that did not appear to be in jeopardy the plan will be for discharge. Patient was offered mobile crisis hotline number and recommended to call 911 immediately with any feelings of a suicidal nature. TTS worker was arranging for social work to come talk with the patient and the wife. After I left the exam room the wife asked for the patient's belongings and eloped stating that "nobody cares about [place] here". I had a long conversation with the wife regarding the appropriate ways to manage his psychiatric care as an outpatient since he would not qualify for  inpatient psychiatric stay and reassured her that we cared greatly about their concerns but did not feel that the emergency department was the appropriate place for him currently unless their circumstances changed.  Final Clinical Impressions(s) / ED Diagnoses   Final diagnoses:  Moderate episode of recurrent major depressive disorder (HCC)  New Prescriptions New Prescriptions   No medications on file     Leo Grosser, MD 05/20/16 1650

## 2016-05-20 NOTE — ED Notes (Signed)
EDP at the bedside speaking with wife

## 2016-05-20 NOTE — BH Assessment (Signed)
Informed patient and wife of disposition with EDP and patients wife states that she wanted patient to be admitted to the [place]. Patients wife states that she only brought the patient here to "get the packet to transfer" to the [place] or to have him placed because she does not feel that she is able to manage his behavior in the home. Patients wife states that she does not feel that the patient likes to be left at home and she cannot manage him acting out stating "I have a life and I cannot be home with him 24/7" she also states "I won't have any friends soon because I keep asking people to sit with you while I'm out." Patients wife states that she also feels that the patient does not like for other people to sit with him in the home stating "they don't want to put up with you freaking out either." Patients wife was encouraged to take the patient directly to the [place] if she would like for him to be placed there and she states "and then what? I have to come back and it will be dark soon and I have to feed my dog." Patients wife states that she would like for the patient to stay overnight or until Monday and she could come and take him to the [place]. Patients wife states that she "cannot deal with the way he acted this morning and what if he does that again?" Patient continued to deny that he was suicidal but states "she obviously does not feel comfortable with me coming home" and stated that he was stressed due to her feeling like she did not want him at home. Patients wife states that the patient "freaked out" due to her leaving and she has to leave the home because she has church meetings. Patients wife states that she was scheduled to be at a church meeting today and would like to attend her scheduled meeting. When asked if he was suicidal by the EDP patient states "ask her" and pointed to his wife. Patient was encouraged to be honest about his suicidal intent or plan and states "I don't know." When asked about his suicidal  intent or plan when being discharged patient did not disclose suicidal intent or plan but states that he would be "stressed." Patients and wife were encouraged to call 911 if patient feels suicidal or return to [facility] as the patients psychiatrist recommended earlier. Patients wife stated that she did not want to go through this process again or come back to an emergency room and stated that she did not want to go into another facility for another assessment.  Patient and wife stated that they wanted to leave and was asked to speak with a CSW about additional resources. TTS counselor provided a mobile crisis card and informed patient and wife that if the patient felt stressed again or if the wife feels stressed she can contact mobile crisis and have a clinician come to speak with them face to face since the patients wife states that she did not want to come out of the house. Patient and wife were instructed to contact 911 if the patient states that he wants to kill himself or threatens to harm himself or anyone else.   Matthew Hawking, LCSW Therapeutic Triage Specialist [place] 05/20/2016 6:07 PM

## 2016-05-20 NOTE — ED Notes (Signed)
Pt and wife leave, wife is a bit upset. CSW walks out with family and encouraged them to wait for paperwork.  Resource info was given to wife by CSW.  Did not wait for paperwork

## 2016-05-20 NOTE — ED Triage Notes (Addendum)
Per EMS pt c/o suicidal ideation onset this morning. Pt had knife in hand with intention to commit suicide, pt's wife called [employer]. Wife states patient has not been drinking fluids. Orthostatic BP negative.  Pt denies SI/HI/AVH to this RN. Reports he "can't fit in," no one likes him, and his wife said she is leaving him because she doesn't want to be his caretaker.

## 2016-05-22 ENCOUNTER — Other Ambulatory Visit (HOSPITAL_COMMUNITY): Payer: Medicare Other

## 2016-05-23 ENCOUNTER — Other Ambulatory Visit (HOSPITAL_COMMUNITY): Payer: Medicare Other

## 2016-05-24 ENCOUNTER — Other Ambulatory Visit (HOSPITAL_COMMUNITY): Payer: Medicare Other

## 2016-05-25 ENCOUNTER — Other Ambulatory Visit (HOSPITAL_COMMUNITY): Payer: Medicare Other

## 2016-05-26 ENCOUNTER — Other Ambulatory Visit (HOSPITAL_COMMUNITY): Payer: Medicare Other

## 2016-05-29 ENCOUNTER — Other Ambulatory Visit (HOSPITAL_COMMUNITY): Payer: Medicare Other

## 2016-05-30 ENCOUNTER — Other Ambulatory Visit (HOSPITAL_COMMUNITY): Payer: Medicare Other

## 2016-05-31 ENCOUNTER — Other Ambulatory Visit (HOSPITAL_COMMUNITY): Payer: Medicare Other

## 2016-06-01 ENCOUNTER — Other Ambulatory Visit (HOSPITAL_COMMUNITY): Payer: Medicare Other

## 2016-06-02 ENCOUNTER — Other Ambulatory Visit (HOSPITAL_COMMUNITY): Payer: Medicare Other

## 2016-06-05 ENCOUNTER — Other Ambulatory Visit (HOSPITAL_COMMUNITY): Payer: Medicare Other

## 2016-06-06 ENCOUNTER — Other Ambulatory Visit (HOSPITAL_COMMUNITY): Payer: Medicare Other

## 2016-06-07 ENCOUNTER — Other Ambulatory Visit (HOSPITAL_COMMUNITY): Payer: Medicare Other

## 2016-06-08 ENCOUNTER — Other Ambulatory Visit (HOSPITAL_COMMUNITY): Payer: Medicare Other

## 2016-06-09 ENCOUNTER — Other Ambulatory Visit (HOSPITAL_COMMUNITY): Payer: Medicare Other

## 2016-06-12 ENCOUNTER — Other Ambulatory Visit (HOSPITAL_COMMUNITY): Payer: Medicare Other

## 2016-06-13 ENCOUNTER — Other Ambulatory Visit (HOSPITAL_COMMUNITY): Payer: Medicare Other

## 2016-06-14 ENCOUNTER — Other Ambulatory Visit (HOSPITAL_COMMUNITY): Payer: Medicare Other

## 2016-06-15 ENCOUNTER — Other Ambulatory Visit (HOSPITAL_COMMUNITY): Payer: Medicare Other

## 2016-06-16 ENCOUNTER — Other Ambulatory Visit (HOSPITAL_COMMUNITY): Payer: Medicare Other

## 2016-06-19 ENCOUNTER — Other Ambulatory Visit (HOSPITAL_COMMUNITY): Payer: Medicare Other

## 2016-06-20 ENCOUNTER — Other Ambulatory Visit (HOSPITAL_COMMUNITY): Payer: Medicare Other

## 2016-06-21 ENCOUNTER — Other Ambulatory Visit (HOSPITAL_COMMUNITY): Payer: Medicare Other

## 2016-06-22 ENCOUNTER — Other Ambulatory Visit (HOSPITAL_COMMUNITY): Payer: Medicare Other

## 2016-06-23 ENCOUNTER — Other Ambulatory Visit (HOSPITAL_COMMUNITY): Payer: Medicare Other

## 2016-06-26 ENCOUNTER — Other Ambulatory Visit (HOSPITAL_COMMUNITY): Payer: Medicare Other

## 2016-06-27 ENCOUNTER — Other Ambulatory Visit (HOSPITAL_COMMUNITY): Payer: Medicare Other

## 2016-06-27 DIAGNOSIS — M545 Low back pain, unspecified: Secondary | ICD-10-CM

## 2016-06-27 HISTORY — DX: Low back pain, unspecified: M54.50

## 2016-06-28 ENCOUNTER — Other Ambulatory Visit (HOSPITAL_COMMUNITY): Payer: Medicare Other

## 2016-06-29 ENCOUNTER — Other Ambulatory Visit (HOSPITAL_COMMUNITY): Payer: Medicare Other

## 2016-06-30 ENCOUNTER — Other Ambulatory Visit (HOSPITAL_COMMUNITY): Payer: Medicare Other

## 2016-07-03 ENCOUNTER — Other Ambulatory Visit (HOSPITAL_COMMUNITY): Payer: Medicare Other

## 2016-07-27 DIAGNOSIS — F321 Major depressive disorder, single episode, moderate: Secondary | ICD-10-CM | POA: Diagnosis not present

## 2016-08-03 DIAGNOSIS — F321 Major depressive disorder, single episode, moderate: Secondary | ICD-10-CM | POA: Diagnosis not present

## 2016-08-17 DIAGNOSIS — F321 Major depressive disorder, single episode, moderate: Secondary | ICD-10-CM | POA: Diagnosis not present

## 2016-08-30 DIAGNOSIS — F329 Major depressive disorder, single episode, unspecified: Secondary | ICD-10-CM | POA: Diagnosis not present

## 2016-08-31 DIAGNOSIS — E784 Other hyperlipidemia: Secondary | ICD-10-CM | POA: Diagnosis not present

## 2016-08-31 DIAGNOSIS — I10 Essential (primary) hypertension: Secondary | ICD-10-CM | POA: Diagnosis not present

## 2016-09-07 DIAGNOSIS — K589 Irritable bowel syndrome without diarrhea: Secondary | ICD-10-CM | POA: Diagnosis not present

## 2016-09-07 DIAGNOSIS — R3989 Other symptoms and signs involving the genitourinary system: Secondary | ICD-10-CM | POA: Diagnosis not present

## 2016-09-07 DIAGNOSIS — K219 Gastro-esophageal reflux disease without esophagitis: Secondary | ICD-10-CM | POA: Diagnosis not present

## 2016-09-07 DIAGNOSIS — E784 Other hyperlipidemia: Secondary | ICD-10-CM | POA: Diagnosis not present

## 2016-09-07 DIAGNOSIS — Z6823 Body mass index (BMI) 23.0-23.9, adult: Secondary | ICD-10-CM | POA: Diagnosis not present

## 2016-09-07 DIAGNOSIS — I10 Essential (primary) hypertension: Secondary | ICD-10-CM | POA: Diagnosis not present

## 2016-09-07 DIAGNOSIS — G4733 Obstructive sleep apnea (adult) (pediatric): Secondary | ICD-10-CM | POA: Diagnosis not present

## 2016-09-07 DIAGNOSIS — Z1389 Encounter for screening for other disorder: Secondary | ICD-10-CM | POA: Diagnosis not present

## 2016-09-07 DIAGNOSIS — G5601 Carpal tunnel syndrome, right upper limb: Secondary | ICD-10-CM | POA: Diagnosis not present

## 2016-09-07 DIAGNOSIS — Z0000 Encounter for general adult medical examination without abnormal findings: Secondary | ICD-10-CM | POA: Diagnosis not present

## 2016-09-07 DIAGNOSIS — M48061 Spinal stenosis, lumbar region without neurogenic claudication: Secondary | ICD-10-CM | POA: Diagnosis not present

## 2016-09-07 DIAGNOSIS — F3289 Other specified depressive episodes: Secondary | ICD-10-CM | POA: Diagnosis not present

## 2016-09-12 ENCOUNTER — Emergency Department (HOSPITAL_COMMUNITY)
Admission: EM | Admit: 2016-09-12 | Discharge: 2016-09-12 | Disposition: A | Discharge status: Home / Self Care | Payer: Medicare Other | Attending: Physician Assistant | Admitting: Physician Assistant

## 2016-09-12 DIAGNOSIS — J449 Chronic obstructive pulmonary disease, unspecified: Secondary | ICD-10-CM | POA: Insufficient documentation

## 2016-09-12 DIAGNOSIS — Z79899 Other long term (current) drug therapy: Secondary | ICD-10-CM | POA: Insufficient documentation

## 2016-09-12 DIAGNOSIS — I10 Essential (primary) hypertension: Secondary | ICD-10-CM | POA: Diagnosis not present

## 2016-09-12 DIAGNOSIS — R55 Syncope and collapse: Secondary | ICD-10-CM | POA: Diagnosis not present

## 2016-09-12 DIAGNOSIS — Z7982 Long term (current) use of aspirin: Secondary | ICD-10-CM | POA: Insufficient documentation

## 2016-09-12 DIAGNOSIS — Z87891 Personal history of nicotine dependence: Secondary | ICD-10-CM | POA: Diagnosis not present

## 2016-09-12 DIAGNOSIS — R197 Diarrhea, unspecified: Secondary | ICD-10-CM | POA: Diagnosis not present

## 2016-09-12 DIAGNOSIS — R404 Transient alteration of awareness: Secondary | ICD-10-CM | POA: Diagnosis not present

## 2016-09-12 LAB — COMPREHENSIVE METABOLIC PANEL
ALT: 21 U/L (ref 17–63)
AST: 26 U/L (ref 15–41)
Albumin: 3.8 g/dL (ref 3.5–5.0)
Alkaline Phosphatase: 62 U/L (ref 38–126)
Anion gap: 12 (ref 5–15)
BUN: 18 mg/dL (ref 6–20)
CHLORIDE: 102 mmol/L (ref 101–111)
CO2: 22 mmol/L (ref 22–32)
CREATININE: 1.16 mg/dL (ref 0.61–1.24)
Calcium: 8.8 mg/dL — ABNORMAL LOW (ref 8.9–10.3)
GFR calc Af Amer: >60 mL/min (ref >60)
GLUCOSE: 109 mg/dL — AB (ref 65–99)
Potassium: 3.7 mmol/L (ref 3.5–5.1)
Sodium: 136 mmol/L (ref 135–145)
Total Bilirubin: 0.6 mg/dL (ref 0.3–1.2)
Total Protein: 6.6 g/dL (ref 6.5–8.1)

## 2016-09-12 LAB — CBC WITH DIFFERENTIAL/PLATELET
Basophils Absolute: 0 10*3/uL (ref 0.0–0.1)
Basophils Relative: 0 %
EOS ABS: 0 10*3/uL (ref 0.0–0.7)
Eosinophils Relative: 0 %
HCT: 40.3 % (ref 39.0–52.0)
Hemoglobin: 13.8 g/dL (ref 13.0–17.0)
LYMPHS ABS: 0.3 10*3/uL — AB (ref 0.7–4.0)
Lymphocytes Relative: 3 %
MCH: 31.6 pg (ref 26.0–34.0)
MCHC: 34.2 g/dL (ref 30.0–36.0)
MCV: 92.2 fL (ref 78.0–100.0)
Monocytes Absolute: 0.7 10*3/uL (ref 0.1–1.0)
Monocytes Relative: 8 %
Neutro Abs: 6.9 10*3/uL (ref 1.7–7.7)
Neutrophils Relative %: 89 %
Platelets: 190 10*3/uL (ref 150–400)
RBC: 4.37 MIL/uL (ref 4.22–5.81)
RDW: 12.2 % (ref 11.5–15.5)
WBC: 7.9 10*3/uL (ref 4.0–10.5)

## 2016-09-12 LAB — POC OCCULT BLOOD, ED: Fecal Occult Bld: NEGATIVE

## 2016-09-12 LAB — ETHANOL

## 2016-09-12 MED ORDER — SODIUM CHLORIDE 0.9 % IV BOLUS (SEPSIS)
1000.0000 mL | Freq: Once | INTRAVENOUS | Status: AC
  Administered 2016-09-12: 1000 mL via INTRAVENOUS

## 2016-09-12 NOTE — ED Triage Notes (Signed)
Per [facility]_ Pt c/o of diarrhea with N/V x 1 day. Hypotensive with near syncope with EMS. 1 liter bolus NS infused in route. 91%RA 98% 2lNC. Denies fever. BP 90/60 lowest Pt denies any other symptoms

## 2016-09-12 NOTE — Discharge Instructions (Signed)
Please follow up with PCP tomorrow for further evaluation if not improving. Drink pletny of fluids, rest.   Retuirn with any concerns, blood in your stool. You may use lomotil to reduce dairhea, but should not be used more than 2 days.

## 2016-09-12 NOTE — ED Notes (Addendum)
ED Provider at bedside. 

## 2016-09-12 NOTE — ED Provider Notes (Signed)
[facility] DEPT Provider Note   CSN: [id number] Arrival date & time: 09/12/16  1605     History   Chief Complaint Chief Complaint  Patient presents with  . Hypotension  . Diarrhea  . Near Syncope    HPI Matthew Patrick is a 71 y.o. male.  HPI   Patient is 72 year old male with history of anxiety and depression, hypertension presenting today with diarrhea and weakness. Patient felt onto usual state of health until last night when he started to have diarrhea. Has mild nausea. Patient denied it being black in color. Patient had no recent antibiotic use, no travel, no suspicious food intake. Patient had several episodes over the course of the night and then felt very weak and called EMS today. Patient denies taking any other medications or current SI HI.  Past Medical History:  Diagnosis Date  . Anxiety   . Asthma   . Depression   . Hyperlipemia   . Hypertension   . Sleep apnea    has c-pap     Patient Active Problem List   Diagnosis Date Noted  . Generalized weakness 09/22/2015  . Depression 09/22/2015  . Anemia 09/22/2015  . Obsessive compulsive disorder 09/22/2015  . Generalized anxiety disorder 09/22/2015  . SIADH (syndrome of inappropriate ADH production) ([place]) 09/21/2015  . Hyponatremia 09/20/2015  . Severe recurrent major depression with psychotic features ([place]) 09/19/2015  . Major depressive disorder, recurrent severe without psychotic features ([place]) 09/17/2015  . Suicidal ideation 09/17/2015  . Cough 01/19/2015  . Restless leg 01/19/2015  . COLD (chronic obstructive lung disease) ([place]) 05/22/2014  . Lung nodule < 6cm on CT 05/22/2014  . OSA (obstructive sleep apnea) 07/11/2013  . Lung nodule 05/11/2013  . Nocturnal hypoxemia 05/11/2013  . Obstructive lung disease ([place]) 04/17/2013  . Chronic cough 04/17/2013  . Syncope 03/25/2011  . Hypertension 03/25/2011  . Hypokalemia 03/25/2011  . Asthma 03/25/2011  . Sciatica 03/25/2011  . SOB (shortness of breath)  03/25/2011    Past Surgical History:  Procedure Laterality Date  . 2D Echocardiogram  03/27/11  . CATARACT EXTRACTION    . Neck skin tag biopsy    . TONSILLECTOMY AND ADENOIDECTOMY         Home Medications    Prior to Admission medications   Medication Sig Start Date End Date Taking? Authorizing Provider  albuterol (PROVENTIL HFA;VENTOLIN HFA) 108 (90 Base) MCG/ACT inhaler Inhale 1 puff into the lungs every 6 (six) hours as needed for wheezing or shortness of breath.   Yes [provider]  amantadine (SYMMETREL) 100 MG capsule Take 100 mg by mouth 2 (two) times daily.   Yes [provider]  aspirin 81 MG tablet Take 81 mg by mouth daily.   Yes [provider]  B Complex-C (B-COMPLEX WITH VITAMIN C) tablet Take 1 tablet by mouth daily.   Yes [provider]  feeding supplement (BOOST / RESOURCE BREEZE) LIQD Take 1 Container by mouth 2 (two) times daily between meals. 09/22/15  Yes Theodoro Grist, MD  fluticasone (FLONASE) 50 MCG/ACT nasal spray Place 2 sprays into both nostrils daily.   Yes [provider]  gabapentin (NEURONTIN) 300 MG capsule Take 900 mg by mouth at bedtime.    Yes [provider]  ibuprofen (ADVIL,MOTRIN) 200 MG tablet Take 600 mg by mouth every 6 (six) hours as needed.   Yes [provider]  lamoTRIgine (LAMICTAL) 25 MG tablet Take 25 mg by mouth daily. 04/20/16  Yes [provider]  LORazepam (ATIVAN) 0.5 MG tablet Take 0.5 tablets (0.25 mg total) by mouth 4 (four) times daily. 09/22/15  Yes Theodoro Grist, MD  LORazepam (ATIVAN) 1 MG tablet Take 1 mg by mouth 4 (four) times daily as needed. 03/28/16  Yes [provider]  mirtazapine (REMERON) 15 MG tablet Take 15 mg by mouth at bedtime. 04/20/16  Yes [provider]  ondansetron (ZOFRAN) 4 MG tablet Take 4 mg by mouth daily. 02/03/16  Yes [provider]  Polyethyl Glycol-Propyl Glycol (SYSTANE ULTRA OP) Apply 1 drop to  eye.   Yes [provider]  QUEtiapine (SEROQUEL) 50 MG tablet Take 3 tablets (150 mg total) by mouth at bedtime. 09/24/15  Yes Clapacs, Madie Reno, MD  traMADol (ULTRAM) 50 MG tablet Take 1 tablet (50 mg total) by mouth every 6 (six) hours as needed. 04/10/16  Yes Long, Wonda Olds, MD  zolpidem (AMBIEN) 10 MG tablet Take 10 mg by mouth at bedtime as needed for sleep.   Yes [provider]    Family History Family History  Problem Relation Age of Onset  . Depression Brother   . Depression Sister   . Heart disease Father   . Anxiety disorder Father   . Depression Father   . Emphysema Mother   . Allergies Mother     Social History Social History  Substance Use Topics  . Smoking status: Former Smoker    Packs/day: 1.00    Years: 5.00    Types: Cigarettes    Quit date: 04/25/1967  . Smokeless tobacco: Never Used  . Alcohol use No     Allergies   Patient has no known allergies.   Review of Systems Review of Systems  Constitutional: Positive for fatigue. Negative for fever.  Gastrointestinal: Positive for diarrhea and nausea. Negative for abdominal distention, abdominal pain, anal bleeding, blood in stool, constipation, rectal pain and vomiting.  Neurological: Positive for weakness.  All other systems reviewed and are negative.    Physical Exam Updated Vital Signs BP 129/62 (BP Location: Left Arm)   Pulse 89   Temp 99.4 F (37.4 C) (Oral)   Resp 15   SpO2 96%   Physical Exam  Constitutional: He is oriented to person, place, and time. He appears well-nourished.  HENT:  Head: Normocephalic.  Eyes: Conjunctivae are normal.  Cardiovascular: Normal rate and regular rhythm.   Pulmonary/Chest: Effort normal and breath sounds normal.  Abdominal: Soft. He exhibits no distension. There is no tenderness.  Genitourinary: Rectum normal.  Genitourinary Comments: Rectum normal, no blood on exam  Neurological: He is oriented to person, place, and time.  Skin: Skin is  warm and dry. He is not diaphoretic.  Psychiatric: He has a normal mood and affect. His behavior is normal.     ED Treatments / Results  Labs (all labs ordered are listed, but only abnormal results are displayed) Labs Reviewed  COMPREHENSIVE METABOLIC PANEL - Abnormal; Notable for the following:       Result Value   Glucose, Bld 109 (*)    Calcium 8.8 (*)    All other components within normal limits  CBC WITH DIFFERENTIAL/PLATELET - Abnormal; Notable for the following:    Lymphs Abs 0.3 (*)    All other components within normal limits  ETHANOL  POC OCCULT BLOOD, ED    EKG  EKG Interpretation None       Radiology No results found.  Procedures Procedures (including critical care time)  Medications Ordered in ED Medications  sodium chloride 0.9 % bolus 1,000 mL (1,000 mLs Intravenous New Bag/Given 09/12/16 1652)     Initial Impression / Assessment and Plan / ED Course  I have reviewed the triage vital signs and the nursing notes.  Pertinent labs & imaging results that were available during my care of the patient were reviewed by me and considered in my medical decision making (see chart for details).     Patient is 71 year old male presenting with weakness and diarrhea. Patient has had less than 24 hours of diarrhea. No blood in stool. Suspect this is viral in nature or foodborne illness. We'll get labs, hydrate patient.  Patient was hypotensive according to EMS. However he has had normal blood pressures since arrival here with only 500 mL of fluid administered by EMS.  Patient has no abdominal pain on exam. Doubt C. difficile or because of no abx use.  7:15 PM Patietn continues to have normal vitals, labs. Ambualted without issue. Taking PO. Will discharge with follow up with PCP this week.     Final Clinical Impressions(s) / ED Diagnoses   Final diagnoses:  None    New Prescriptions New Prescriptions   No medications on file     Macarthur Critchley, MD 09/12/16 [id number]

## 2016-09-14 DIAGNOSIS — F321 Major depressive disorder, single episode, moderate: Secondary | ICD-10-CM | POA: Diagnosis not present

## 2016-10-05 DIAGNOSIS — F321 Major depressive disorder, single episode, moderate: Secondary | ICD-10-CM | POA: Diagnosis not present

## 2016-10-24 DIAGNOSIS — R300 Dysuria: Secondary | ICD-10-CM | POA: Diagnosis not present

## 2016-11-09 DIAGNOSIS — F321 Major depressive disorder, single episode, moderate: Secondary | ICD-10-CM | POA: Diagnosis not present

## 2016-12-03 DIAGNOSIS — M6281 Muscle weakness (generalized): Secondary | ICD-10-CM | POA: Diagnosis not present

## 2016-12-03 DIAGNOSIS — R42 Dizziness and giddiness: Secondary | ICD-10-CM | POA: Diagnosis not present

## 2017-01-11 ENCOUNTER — Emergency Department (HOSPITAL_COMMUNITY): Payer: Medicare Other

## 2017-01-11 ENCOUNTER — Emergency Department (HOSPITAL_COMMUNITY)
Admission: EM | Admit: 2017-01-11 | Discharge: 2017-01-11 | Disposition: A | Discharge status: Home / Self Care | Payer: Medicare Other | Attending: Emergency Medicine | Admitting: Emergency Medicine

## 2017-01-11 ENCOUNTER — Encounter (HOSPITAL_COMMUNITY): Payer: Self-pay | Admitting: Emergency Medicine

## 2017-01-11 DIAGNOSIS — I10 Essential (primary) hypertension: Secondary | ICD-10-CM | POA: Insufficient documentation

## 2017-01-11 DIAGNOSIS — J45909 Unspecified asthma, uncomplicated: Secondary | ICD-10-CM | POA: Diagnosis not present

## 2017-01-11 DIAGNOSIS — R911 Solitary pulmonary nodule: Secondary | ICD-10-CM | POA: Diagnosis not present

## 2017-01-11 DIAGNOSIS — R410 Disorientation, unspecified: Secondary | ICD-10-CM | POA: Insufficient documentation

## 2017-01-11 DIAGNOSIS — R4182 Altered mental status, unspecified: Secondary | ICD-10-CM | POA: Diagnosis present

## 2017-01-11 DIAGNOSIS — J449 Chronic obstructive pulmonary disease, unspecified: Secondary | ICD-10-CM | POA: Diagnosis not present

## 2017-01-11 LAB — CBC
HCT: 32.6 % — ABNORMAL LOW (ref 39.0–52.0)
HEMOGLOBIN: 11.4 g/dL — AB (ref 13.0–17.0)
MCH: 31.8 pg (ref 26.0–34.0)
MCHC: 35 g/dL (ref 30.0–36.0)
MCV: 90.8 fL (ref 78.0–100.0)
Platelets: 209 10*3/uL (ref 150–400)
RBC: 3.59 MIL/uL — AB (ref 4.22–5.81)
RDW: 12.7 % (ref 11.5–15.5)
WBC: 6.4 10*3/uL (ref 4.0–10.5)

## 2017-01-11 LAB — POCT I-STAT, CHEM 8
BUN: 12 mg/dL (ref 6–20)
Calcium, Ion: 1.13 mmol/L — ABNORMAL LOW (ref 1.15–1.40)
Chloride: 98 mmol/L — ABNORMAL LOW (ref 101–111)
Creatinine, Ser: 0.8 mg/dL (ref 0.61–1.24)
GLUCOSE: 98 mg/dL (ref 65–99)
HCT: 32 % — ABNORMAL LOW (ref 39.0–52.0)
HEMOGLOBIN: 10.9 g/dL — AB (ref 13.0–17.0)
POTASSIUM: 4.1 mmol/L (ref 3.5–5.1)
Sodium: 132 mmol/L — ABNORMAL LOW (ref 135–145)
TCO2: 27 mmol/L (ref 22–32)

## 2017-01-11 LAB — COMPREHENSIVE METABOLIC PANEL
ALK PHOS: 69 U/L (ref 38–126)
ALT: 16 U/L — ABNORMAL LOW (ref 17–63)
ANION GAP: 7 (ref 5–15)
AST: 18 U/L (ref 15–41)
Albumin: 3.8 g/dL (ref 3.5–5.0)
BILIRUBIN TOTAL: 0.5 mg/dL (ref 0.3–1.2)
BUN: 12 mg/dL (ref 6–20)
CALCIUM: 8.9 mg/dL (ref 8.9–10.3)
CO2: 24 mmol/L (ref 22–32)
Chloride: 102 mmol/L (ref 101–111)
Creatinine, Ser: 0.77 mg/dL (ref 0.61–1.24)
Glucose, Bld: 100 mg/dL — ABNORMAL HIGH (ref 65–99)
Potassium: 4 mmol/L (ref 3.5–5.1)
Sodium: 133 mmol/L — ABNORMAL LOW (ref 135–145)
TOTAL PROTEIN: 6.2 g/dL — AB (ref 6.5–8.1)

## 2017-01-11 LAB — CBG MONITORING, ED: GLUCOSE-CAPILLARY: 90 mg/dL (ref 65–99)

## 2017-01-11 LAB — URINALYSIS, ROUTINE W REFLEX MICROSCOPIC
Bilirubin Urine: NEGATIVE
GLUCOSE, UA: NEGATIVE mg/dL
Hgb urine dipstick: NEGATIVE
KETONES UR: NEGATIVE mg/dL
Leukocytes, UA: NEGATIVE
NITRITE: NEGATIVE
PROTEIN: NEGATIVE mg/dL
Specific Gravity, Urine: 1.006 (ref 1.005–1.030)
pH: 6 (ref 5.0–8.0)

## 2017-01-11 NOTE — Discharge Instructions (Signed)
Please call your [facility] psychiatrist regarding management for your new psychiatric medication. Your blood work is reassuring.  You have lung nodules, please let your PCP know to follow-up with CT chest. You must return without fail for worsening symptoms, including fever, worsening confusion, difficulty walking, or any other symptoms concerning to you.

## 2017-01-11 NOTE — ED Notes (Signed)
2x unsuccessful IV attempts.

## 2017-01-11 NOTE — ED Notes (Signed)
Patient transported to CT 

## 2017-01-11 NOTE — ED Triage Notes (Addendum)
Pt complaint of hypotension with reading of 114/62 at home, confusion, and wetting self. Family concern for dehydration and/or UTI; recently discharged for severe depression.   Pt denies numbness, all extremities equal/strong, pt able to verbalize day of week/year/month but not specific day otherwise pt alert to self, situation, and place.

## 2017-01-11 NOTE — ED Provider Notes (Signed)
[facility] DEPT Provider Note   CSN: [id number] Arrival date & time: 01/11/17  1445     History   Chief Complaint Chief Complaint  Patient presents with  . Altered Mental Status    HPI Matthew Patrick is a 71 y.o. male.  The history is provided by the patient.  Altered Mental Status   This is a new problem. The current episode started 2 days ago. The problem has not changed since onset.Associated symptoms include confusion and weakness. His past medical history is significant for depression.   71 year old male who presents with confusion, ongoing for the past 2 days. Patient was just discharged from the [facility] for a week ago for treatment of depression and started on a new antidepressant. He reports that over the past 2 days he has had generalized weakness, nausea, and intermittent confusion. His wife states that he intermittently has word finding difficulties. States that he is very restless at times, and we will start to do certain activities, but then turn around and sit back down. He has not had any fevers, chills, vomiting, diarrhea, cough or shortness of breath. His wife does report that he has had incontinence of urine due to inability to get to the restroom on time.   Past Medical History:  Diagnosis Date  . Anxiety   . Asthma   . Depression   . Hyperlipemia   . Hypertension   . Sleep apnea    has c-pap     Patient Active Problem List   Diagnosis Date Noted  . Generalized weakness 09/22/2015  . Depression 09/22/2015  . Anemia 09/22/2015  . Obsessive compulsive disorder 09/22/2015  . Generalized anxiety disorder 09/22/2015  . SIADH (syndrome of inappropriate ADH production) ([place]) 09/21/2015  . Hyponatremia 09/20/2015  . Severe recurrent major depression with psychotic features ([place]) 09/19/2015  . Major depressive disorder, recurrent severe without psychotic features ([place]) 09/17/2015  . Suicidal ideation 09/17/2015  . Cough 01/19/2015  . Restless leg  01/19/2015  . COLD (chronic obstructive lung disease) ([place]) 05/22/2014  . Lung nodule < 6cm on CT 05/22/2014  . OSA (obstructive sleep apnea) 07/11/2013  . Lung nodule 05/11/2013  . Nocturnal hypoxemia 05/11/2013  . Obstructive lung disease ([place]) 04/17/2013  . Chronic cough 04/17/2013  . Syncope 03/25/2011  . Hypertension 03/25/2011  . Hypokalemia 03/25/2011  . Asthma 03/25/2011  . Sciatica 03/25/2011  . SOB (shortness of breath) 03/25/2011    Past Surgical History:  Procedure Laterality Date  . 2D Echocardiogram  03/27/11  . CATARACT EXTRACTION    . Neck skin tag biopsy    . TONSILLECTOMY AND ADENOIDECTOMY         Home Medications    Prior to Admission medications   Medication Sig Start Date End Date Taking? Authorizing Provider  amLODipine (NORVASC) 5 MG tablet Take 5 mg by mouth every morning.   Yes [provider]  aspirin 81 MG tablet Take 81 mg by mouth daily.   Yes [provider]  loratadine (CLARITIN) 10 MG tablet Take 10 mg by mouth every morning.   Yes [provider]  LORazepam (ATIVAN) 0.5 MG tablet Take 0.5 tablets (0.25 mg total) by mouth 4 (four) times daily. Patient taking differently: Take 0.25 mg by mouth 4 (four) times daily as needed for anxiety.  09/22/15  Yes Theodoro Grist, MD  paliperidone (INVEGA) 6 MG 24 hr tablet Take 6 mg by mouth every morning.   Yes [provider]  Suvorexant (BELSOMRA) 20 MG  TABS Take 20 mg by mouth at bedtime.   Yes [provider]  tamsulosin (FLOMAX) 0.4 MG CAPS capsule Take 0.4 mg by mouth at bedtime.   Yes [provider]  vortioxetine HBr (TRINTELLIX) 5 MG TABS Take 5 mg by mouth every morning.   Yes [provider]  QUEtiapine (SEROQUEL) 50 MG tablet Take 3 tablets (150 mg total) by mouth at bedtime. Patient not taking: Reported on 01/11/2017 09/24/15   Clapacs, Madie Reno, MD  traMADol (ULTRAM) 50 MG tablet Take 1 tablet (50 mg total) by mouth every 6 (six) hours  as needed. Patient not taking: Reported on 01/11/2017 04/10/16   Long, Wonda Olds, MD    Family History Family History  Problem Relation Age of Onset  . Depression Brother   . Depression Sister   . Heart disease Father   . Anxiety disorder Father   . Depression Father   . Emphysema Mother   . Allergies Mother     Social History Social History  Substance Use Topics  . Smoking status: Former Smoker    Packs/day: 1.00    Years: 5.00    Types: Cigarettes    Quit date: 04/25/1967  . Smokeless tobacco: Never Used  . Alcohol use No     Allergies   Patient has no known allergies.   Review of Systems Review of Systems  Constitutional: Negative for fever.  Respiratory: Negative for cough and shortness of breath.   Cardiovascular: Negative for chest pain.  Neurological: Positive for weakness.  Psychiatric/Behavioral: Positive for confusion.  All other systems reviewed and are negative.    Physical Exam Updated Vital Signs BP (!) 117/46 (BP Location: Left Arm)   Pulse 87   Temp 98 F (36.7 C) (Oral)   Resp 18   Ht 5\' 10"  (1.778 m)   Wt 68 kg (150 lb)   SpO2 95%   BMI 21.52 kg/m   Physical Exam Physical Exam  Nursing note and vitals reviewed. Constitutional: Well developed, well nourished, non-toxic, and in no acute distress Head: Normocephalic and atraumatic.  Mouth/Throat: Oropharynx is clear and moist.  Neck: Normal range of motion. Neck supple.  Cardiovascular: Normal rate and regular rhythm.   Pulmonary/Chest: Effort normal and breath sounds normal.  Abdominal: Soft. There is no tenderness. There is no rebound and no guarding.  Musculoskeletal: Normal range of motion.  Skin: Skin is warm and dry.  Psychiatric: Cooperative Neurological:  Alert, oriented to person, place, time, and situation. Memory grossly in tact. Fluent speech. No dysarthria or aphasia.  Cranial nerves: VF are full. EOMI without nystagmus. No gaze deviation. Facial muscles symmetric with  activation. Sensation to light touch over face in tact bilaterally. Hearing grossly in tact. Palate elevates symmetrically. Head turn and shoulder shrug are intact. Tongue midline.  Reflexes defered.  Muscle bulk and tone normal. No pronator drift. Moves all extremities symmetrically. Sensation to light touch is in tact throughout in bilateral upper and lower extremities. Coordination reveals no dysmetria with finger to nose. Normal gait   ED Treatments / Results  Labs (all labs ordered are listed, but only abnormal results are displayed) Labs Reviewed  COMPREHENSIVE METABOLIC PANEL - Abnormal; Notable for the following:       Result Value   Sodium 133 (*)    Glucose, Bld 100 (*)    Total Protein 6.2 (*)    ALT 16 (*)    All other components within normal limits  CBC - Abnormal; Notable for the  following:    RBC 3.59 (*)    Hemoglobin 11.4 (*)    HCT 32.6 (*)    All other components within normal limits  POCT I-STAT, CHEM 8 - Abnormal; Notable for the following:    Sodium 132 (*)    Chloride 98 (*)    Calcium, Ion 1.13 (*)    Hemoglobin 10.9 (*)    HCT 32.0 (*)    All other components within normal limits  URINALYSIS, ROUTINE W REFLEX MICROSCOPIC  CBG MONITORING, ED  I-STAT CHEM 8, ED    EKG  EKG Interpretation None       Radiology Dg Chest 2 View  Result Date: 01/11/2017 CLINICAL DATA:  Confusion. EXAM: CHEST  2 VIEW COMPARISON:  Chest x-ray dated 04/22/2016. FINDINGS: Heart size and mediastinal contours are within normal limits. Lungs are hyperexpanded. On the lateral view, an irregular nodular density is seen adjacent to the posterior basilar pleural surface, retrocardiac, possibly corresponding to chronic appearing atelectasis seen at the right lung base on chest CT of 12/31/2014. Lungs appear otherwise clear. No pleural effusion or pneumothorax seen. IMPRESSION: 1. Irregular nodular density adjacent to the posterior basilar pleural surface, retrocardiac, seen on the  lateral view only, possibly corresponding too chronic appearing atelectasis seen at the right lung base on chest CT of 12/31/2014. Neoplastic nodule cannot be confidently excluded. Recommend chest CT for direct comparison with the earlier chest CT. Additionally, a lingular 5 mm pulmonary nodule was described on the earlier chest CT of 12/31/2014 for which a follow-up chest CT in 12 months was recommended. 2. Lungs otherwise clear. No evidence of pneumonia or pulmonary edema. 3. Hyperexpanded lungs indicating COPD. Electronically Signed   By: Franki Cabot M.D.   On: 01/11/2017 17:42   Ct Head Wo Contrast  Result Date: 01/11/2017 CLINICAL DATA:  "Pt complaint of hypotension with reading of 114/62 at home, confusion, and wetting self. Family concern for dehydration and/or UTI; recently discharged for severe depression. EXAM: CT HEAD WITHOUT CONTRAST TECHNIQUE: Contiguous axial images were obtained from the base of the skull through the vertex without intravenous contrast. COMPARISON:  Head CT dated 04/10/2016. FINDINGS: Brain: Mild generalized age related parenchymal atrophy with commensurate dilatation of the ventricles and sulci. Mild chronic small vessel ischemic changes within the deep periventricular white matter. No mass, hemorrhage, edema or other evidence of acute parenchymal abnormality. No extra-axial hemorrhage. Vascular: No hyperdense vessel or unexpected calcification. Skull: Normal. Negative for fracture or focal lesion. Sinuses/Orbits: No acute finding. Other: None. IMPRESSION: 1. No acute findings.  No intracranial mass, hemorrhage or edema. 2. Mild chronic small vessel ischemic changes in the white matter. Electronically Signed   By: Franki Cabot M.D.   On: 01/11/2017 15:50    Procedures Procedures (including critical care time)  Medications Ordered in ED Medications - No data to display   Initial Impression / Assessment and Plan / ED Course  I have reviewed the triage vital signs and  the nursing notes.  Pertinent labs & imaging results that were available during my care of the patient were reviewed by me and considered in my medical decision making (see chart for details).     He reports presenting with restlessness, and fatigue since starting new antidepressant. He is fully oriented, and has a normal neurological exam. Presentation may be related to his antidepressant. There are no major electrolyte or metabolic derangements on his blood work. Mild hyponatremia of 132, but does not explain symptoms. No evidence of infection on  UA her chest x-ray. He does have long nodules, which he is notified and he will follow-up with PCP. He is mentating normally, ambulatory. They've requested discharge home as they have preference to follow-up with outpatient PCP and psychiatrist closely. I have discussed strict return instructions for any worsening symptoms as he may warrant further workup and admission.  Final Clinical Impressions(s) / ED Diagnoses   Final diagnoses:  Confusion  Lung nodule    New Prescriptions New Prescriptions   No medications on file     Forde Dandy, MD 01/11/17 [phone]

## 2017-05-11 DIAGNOSIS — F314 Bipolar disorder, current episode depressed, severe, without psychotic features: Secondary | ICD-10-CM | POA: Diagnosis not present

## 2017-05-23 ENCOUNTER — Telehealth: Payer: Self-pay | Admitting: Pulmonary Disease

## 2017-05-23 NOTE — Telephone Encounter (Signed)
ATC spouse Jeani Hawking x2 - line busy Both providers will likely recommend that pt needs an office visit  Last ov w/ RA 2.27.17 Last ov w/ MR 9.27.16

## 2017-05-23 NOTE — Telephone Encounter (Signed)
Wife of patient Matthew Patrick [phone]) left for RA and MR; the letter is in regards to the patient getting [employer] disability; addressed envelope with the letters to be mailed back to patient. Wife states she is requesting two letters from RA and MR because the patient was seen by both providers. Contact # [phone].Marland KitchenMarland KitchenLetters and envelope left in RA folder at the checkout area.Marland Kitchen

## 2017-05-24 NOTE — Telephone Encounter (Signed)
Per RA, he will not write a letter for agent orange possibly causing his sleep apnea and restless leg syndrome. He stated that MR may would write the letter since he is his primary pulmonologist.   Will call patient's wife to let her know.

## 2017-05-28 DIAGNOSIS — R079 Chest pain, unspecified: Secondary | ICD-10-CM | POA: Diagnosis not present

## 2017-05-28 DIAGNOSIS — R0602 Shortness of breath: Secondary | ICD-10-CM | POA: Diagnosis not present

## 2017-05-28 DIAGNOSIS — R0789 Other chest pain: Secondary | ICD-10-CM | POA: Diagnosis not present

## 2017-05-28 DIAGNOSIS — M791 Myalgia, unspecified site: Secondary | ICD-10-CM | POA: Diagnosis not present

## 2017-05-28 DIAGNOSIS — Z87891 Personal history of nicotine dependence: Secondary | ICD-10-CM | POA: Diagnosis not present

## 2017-05-28 DIAGNOSIS — R51 Headache: Secondary | ICD-10-CM | POA: Diagnosis not present

## 2017-05-28 DIAGNOSIS — Z79899 Other long term (current) drug therapy: Secondary | ICD-10-CM | POA: Diagnosis not present

## 2017-05-28 DIAGNOSIS — J45901 Unspecified asthma with (acute) exacerbation: Secondary | ICD-10-CM | POA: Diagnosis not present

## 2017-05-28 DIAGNOSIS — I10 Essential (primary) hypertension: Secondary | ICD-10-CM | POA: Diagnosis not present

## 2017-05-28 DIAGNOSIS — R9431 Abnormal electrocardiogram [ECG] [EKG]: Secondary | ICD-10-CM | POA: Diagnosis not present

## 2017-05-28 DIAGNOSIS — J069 Acute upper respiratory infection, unspecified: Secondary | ICD-10-CM | POA: Diagnosis not present

## 2017-05-28 DIAGNOSIS — Z7982 Long term (current) use of aspirin: Secondary | ICD-10-CM | POA: Diagnosis not present

## 2017-06-04 ENCOUNTER — Telehealth: Payer: Self-pay | Admitting: Pulmonary Disease

## 2017-06-04 NOTE — Telephone Encounter (Signed)
(  copied from 1/30 phone note)  Per RA, he will not write a letter for agent orange possibly causing his sleep apnea and restless leg syndrome. He stated that MR may would write the letter since he is his primary pulmonologist.   Will call patient's wife to let her know.   ----- atc pt's wife X2, line rang to fast busy signal.  wcb.

## 2017-06-05 NOTE — Telephone Encounter (Signed)
Patient's wife is returning call, CB [phone].

## 2017-06-05 NOTE — Telephone Encounter (Signed)
Called and spoke with patients wife, advised her that MR could possibly write the letter. Will speak to MR and advise wife of status once he responds. Patients wife states she will bring a copy of what the letter is supposed to look like.    MR please advise on if this is ok for you to do, thanks!

## 2017-06-05 NOTE — Telephone Encounter (Signed)
Called and spoke with patients wife, they are scheduled for 3.14.19. Patients wife does not want to schedule any testing until they see MR.

## 2017-06-05 NOTE — Telephone Encounter (Signed)
Not seen since sept 2016  Plan - will need repeat spirometry  -  wll need office visit to reassess nodules/obstructive lung disease - at this visit can address letter about agent orange - 30 min office slot; first available  Dr. Brand Males, M.D., [facility].C.P Pulmonary and Critical Care Medicine Staff Physician, [facility] Director - Interstitial Lung Disease  Program  Pulmonary [place] [facility] [place], [place], [address]  Pager: [phone], If no answer or between  15:00h - 7:00h: call [phone] Telephone: [phone]

## 2017-07-04 DIAGNOSIS — F259 Schizoaffective disorder, unspecified: Secondary | ICD-10-CM | POA: Diagnosis not present

## 2017-07-05 ENCOUNTER — Encounter: Payer: Self-pay | Admitting: Internal Medicine

## 2017-07-05 ENCOUNTER — Ambulatory Visit (INDEPENDENT_AMBULATORY_CARE_PROVIDER_SITE_OTHER): Payer: Medicare Other | Admitting: Internal Medicine

## 2017-07-05 VITALS — BP 110/62 | HR 77 | Ht 70.0 in | Wt 157.2 lb

## 2017-07-05 DIAGNOSIS — R911 Solitary pulmonary nodule: Secondary | ICD-10-CM | POA: Diagnosis not present

## 2017-07-05 DIAGNOSIS — Z77098 Contact with and (suspected) exposure to other hazardous, chiefly nonmedicinal, chemicals: Secondary | ICD-10-CM | POA: Diagnosis not present

## 2017-07-05 DIAGNOSIS — J449 Chronic obstructive pulmonary disease, unspecified: Secondary | ICD-10-CM

## 2017-07-05 MED ORDER — TIOTROPIUM BROMIDE MONOHYDRATE 2.5 MCG/ACT IN AERS: 2.0000 | INHALATION_SPRAY | Freq: Every day | RESPIRATORY_TRACT | 0 refills | Status: DC

## 2017-07-05 MED ORDER — TIOTROPIUM BROMIDE MONOHYDRATE 2.5 MCG/ACT IN AERS: 2.0000 | INHALATION_SPRAY | Freq: Every day | RESPIRATORY_TRACT | 3 refills | Status: DC

## 2017-07-05 NOTE — Progress Notes (Signed)
Patient seen in the office today and instructed on use of Spiriva Respimat 2.80mcg.  Patient expressed understanding and demonstrated technique. Parke Poisson, [facility] 07/05/17

## 2017-07-05 NOTE — Patient Instructions (Signed)
ICD-10-CM   1. COPD, moderate ([place]) J44.9   2. H/O agent Orange exposure Z77.098   3. Nodule of left lung R91.1    COPD, moderate ([facility]) H/O agent Orange exposure   - given fact that agent orange was a spray herbicide it is conceivable in my view this contributed to obstructive lung diseas onset or even making prior one worse - many lung diseses can happen decades after exposure as well (eg Dentists getting pulmonary fibrosis)  - do Pre-bd spiro and dlco only. No lung volume or bd response. No post-bd spiro - next few weeks - restart spiriva respimat - take sample/instruction - look up institute of medicine report on agent orange -> http://www.nationalacademies.org/hmd/Reports/2016/Veterans-and-Agent-Orange-Update-2014.aspx   Nodule of left lung - you hjave not had followup for this in years - repeat CT chest without contrast  Followup   - next few to several weeks but after above

## 2017-07-05 NOTE — Progress Notes (Signed)
Subjective:     Patient ID: Matthew Patrick, male   DOB: [birth year], 73 y.o.   MRN: [id number]  HPI  HPI   PROB LIST  - obstructive lung disease nos with low dlco (paint fumes + remote smoking)  - never had subjective improvementt iwht MDI - chronic multifactorial cough  - sinus  - gerd: PPI  0- irritable larynx - started gabapentin early 2015/late 2014 with good rsult, s/P: speech Rx - Lung nodule with hx of remote smoking  -  44mm nodule RUL - no change dec 2014 through Jan 2016 - no fu for this . OSA with restless legs  - sees [facility]. STarted cpap 2015 mid  - resltess legs improved with gabapentin for cough  - smoking hx  -  reports that he quit smoking about 47 years ago. His smoking use included Cigarettes. He has a 5 pack-year smoking history. He does not have any smokeless tobacco history on file.    OV 05/22/2014  Chief Complaint  Patient presents with  . Follow-up    Pt stated his breathing is much improved since starting on CPAP. Pt denies cough, SOB and CP/tightness.     FU for all of above issues  1. Obstructive lung disease: Given lack of subjective improvement with maintenance bronchodilators and steroids. He has stopped using this completely. He only uses albuterol as needed. Every few weeks he does have some chest tightness for which she uses albuterol as rescue. He does not feel the need to take any further inhalers on a scheduled basis. Since I last saw him in March 2015 he has had a diagnosis of sleep apnea made and he is on CPAP which she feels better significant impact in his symptoms and he attributes most of the improvement due to diagnosis and management of sleep apnea. Spirometry today shows continued obstruction at moderate severity he did FEV1 2.3 L/65%, FVC 3.3 L/73%, ratio 69 (  Similar to dec 2014). Of note, did have a cold and took him long time to recover per wife  2. Lung nodule 4 mm right upper lobe: 1 you CT scan of the chest done January 2060 shows this  is stable but patient has new  5 mm pulmonary nodule in the inferior aspect of the lingula - Jan  2016    3. Chronic cough: This continues to be in remission with the help of gabapentin and Flonase and acid reflux treatment. He has now been on gabapentin for for at least a year. We discussed stopping this gradually which he is open to but his wife does not want him to stop his gabapentin ago she worries that his restless leg will come back and rt he will start kicking her in  bed  Smoking:  reports that he quit smoking about 47 years ago. His smoking use included Cigarettes. He has a 5 pack-year smoking history. He does not have any smokeless tobacco history on file.    OV 01/19/2015  Chief Complaint  Patient presents with  . Follow-up    Had CT scan done 2 wks. ago.Feeling better with coughing since on CPAP.Doing well with CPAP.Occass Sob when lying down,nasal congestion when lays down.PND,no sorethroat.    Follow-up  # Obstructive lung disease/like a COPD given exposure history: Overall stable. He is on Spiriva daily. He and his wife [name] that ever since this started using CPAP for sleep apnea overall quality of life, dyspnea, energy levels and restless legs all have improved. Today spirometry  shows FEV1 70%. There are no new issues no emergency room visits no exacerbations  #Cough this is no longer an issue but he still takes the gabapentin for his restless legs which is helping. He is taking 300 mg once daily at night for 2 years almost. Wife does not want him to stop this because they feel restless legs will be worse and he will start kicking her.  #Sleep apnea: This is associated with restless legs. He has not seen Dr. Elsworth Soho in over a year. He is taking gabapentin for restless legs. Wonder if he could go down on gabapentin  #Lung nodule  - Personally visualized the CT image. It is below. He is upset that I did not call him with the CT result from a few weeks ago even though today's  visit is to review these results. In the future he wants me to call him about the CT results even before the visit   MPRESSION: 1. Lingular 5 mm pulmonary nodule, stable for 7 months. A final follow-up chest CT is advised in 12 months. This recommendation follows the consensus statement: Guidelines for Management of Small Pulmonary Nodules Detected on CT Scans: A Statement from the [place] as published in Radiology 2005;237:395-400. 2. Five additional scattered pulmonary nodules, largest 5 mm in the left lower lobe, for which 20 month stability has been demonstrated, in keeping with benign nodules for which no further follow-up is required. 3. Stable subsegmental bibasilar lung scarring.   Electronically Signed  By: Ilona Sorrel M.D.  On: 12/31/2014 13:52   OV 07/05/2017  Chief Complaint  Patient presents with  . Follow-up    Last seen 01/19/15 by MR.  Pt states he is having trouble with becoming SOB a lot easier. Denies any cough or CP.     Matthew Patrick presents for follow-up.  I have not seen him in almost 3 years.  This visit is considered to establish routine follow-up visit because technically is still under 3 years.  But given the span of time I have had to work him up almost like a new patient.  I should see him for moderate obstructive lung disease with history of agent orange exposure and limited smoking.  He also had a lingula 5 mm lung nodule.  He has not followed up for any of these issues including his restless leg syndrome which she was seeing Dr. Elsworth Soho for.  It appears in the interim he has had multiple admissions because of major depression and bipolar disease.  This was all through the [facility].  He has now been discharged around Christmas 2000 2:18 month hospitalization for depression.  He undergoes scheduled electroconvulsive therapy because of major depression.  During all this time his inhaler Spiriva has been forgotten he is not taking any  inhalers.  Overall respiratory symptoms are stable although wife states that he gets dyspneic with exertion.  They want to reassess his lung status and see where things are.  Wife wanted to know if there is a possible link between agent orange exposure and obstructive lung disease.  This is because the official report ONLY history of cancers and neuropathies and birth defects as being linked to agent orange.  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  The [facility] is officially classified him as having been exposed to agent orange.      has a past medical  history of Anxiety, Asthma, Depression, Hyperlipemia, Hypertension, and Sleep apnea.   reports that he quit smoking about 50 years ago. His smoking use included cigarettes. He has a 5.00 pack-year smoking history. he has never used smokeless tobacco.  Past Surgical History:  Procedure Laterality Date  . 2D Echocardiogram  03/27/11  . CATARACT EXTRACTION    . Neck skin tag biopsy    . TONSILLECTOMY AND ADENOIDECTOMY      No Known Allergies  Immunization History  Administered Date(s) Administered  . Influenza Split 02/22/2013, 01/22/2014  . Influenza, High Dose Seasonal PF 01/04/2016  . Influenza,inj,Quad PF,6+ Mos 01/19/2015  . Pneumococcal Conjugate-13 06/10/2014  . Pneumococcal Polysaccharide-23 04/24/2010  . Tdap 06/06/2013    Family History  Problem Relation Age of Onset  . Depression Brother   . Depression Sister   . Heart disease Father   . Anxiety disorder Father   . Depression Father   . Emphysema Mother   . Allergies Mother      Current Outpatient Medications:  .  albuterol (PROVENTIL HFA;VENTOLIN HFA) 108 (90 Base) MCG/ACT inhaler, Inhale into the lungs., Disp: , Rfl:  .  amLODipine (NORVASC) 5 MG tablet, Take 5 mg by mouth every morning., Disp: , Rfl:  .  aspirin 81 MG tablet, Take 81 mg by mouth daily., Disp: , Rfl:  .  B Complex-C  (B-COMPLEX WITH VITAMIN C) tablet, Take by mouth., Disp: , Rfl:  .  fluticasone (FLONASE) 50 MCG/ACT nasal spray, 2 sprays by Both Nostrils route daily as needed., Disp: , Rfl:  .  loratadine (CLARITIN) 10 MG tablet, Take 10 mg by mouth every morning., Disp: , Rfl:  .  LORazepam (ATIVAN) 0.5 MG tablet, Take 0.5 tablets (0.25 mg total) by mouth 4 (four) times daily. (Patient taking differently: Take 0.25 mg by mouth 4 (four) times daily as needed for anxiety. ), Disp: 30 tablet, Rfl: 0 .  Melatonin 5 MG CAPS, Take by mouth., Disp: , Rfl:  .  Polyethyl Glycol-Propyl Glycol (SYSTANE) 0.4-0.3 % GEL ophthalmic gel, Place 1 drop into both eyes 2 (two) times daily., Disp: , Rfl:  .  Suvorexant (BELSOMRA) 20 MG TABS, Take 20 mg by mouth at bedtime., Disp: , Rfl:  .  tamsulosin (FLOMAX) 0.4 MG CAPS capsule, Take 0.4 mg by mouth at bedtime., Disp: , Rfl:  .  traZODone (DESYREL) 50 MG tablet, Take 40 mg by mouth at bedtime., Disp: , Rfl:   Review of Systems     Objective:   Physical Exam  Constitutional: He is oriented to person, place, and time. He appears well-developed and well-nourished. No distress.  HENT:  Head: Normocephalic and atraumatic.  Right Ear: External ear normal.  Left Ear: External ear normal.  Mouth/Throat: Oropharynx is clear and moist. No oropharyngeal exudate.  Eyes: Conjunctivae and EOM are normal. Pupils are equal, round, and reactive to light. Right eye exhibits no discharge. Left eye exhibits no discharge. No scleral icterus.  Neck: Normal range of motion. Neck supple. No JVD present. No tracheal deviation present. No thyromegaly present.  Cardiovascular: Normal rate, regular rhythm and intact distal pulses. Exam reveals no gallop and no friction rub.  No murmur heard. Pulmonary/Chest: Effort normal and breath sounds normal. No respiratory distress. He has no wheezes. He has no rales. He exhibits no tenderness.  Abdominal: Soft. Bowel sounds are normal. He exhibits no  distension and no mass. There is no tenderness. There is no rebound and no guarding.  Musculoskeletal: Normal range of  motion. He exhibits no edema or tenderness.  Lymphadenopathy:    He has no cervical adenopathy.  Neurological: He is alert and oriented to person, place, and time. He has normal reflexes. No cranial nerve deficit. Coordination normal.  Skin: Skin is warm and dry. No rash noted. He is not diaphoretic. No erythema. No pallor.  Psychiatric:  Very flat affect Not giving any hx Wife giving all hx  Nursing note and vitals reviewed.  Vitals:   07/05/17 0934  BP: 110/62  Pulse: 77  SpO2: 94%  Weight: 157 lb 3.2 oz (71.3 kg)  Height: 5\' 10"  (1.778 m)        Assessment:       ICD-10-CM   1. COPD, moderate ([place]) J44.9 Pulmonary function test  2. H/O agent Orange exposure Z77.098   3. Nodule of left lung R91.1        Plan:     COPD, moderate ([place]) H/O agent Orange exposure   - given fact that agent orange was a spray herbicide it is conceivable in my view this contributed to obstructive lung diseas onset or even making prior one worse - many lung diseses can happen decades after exposure as well (eg Dentists getting pulmonary fibrosis)  - do Pre-bd spiro and dlco only. No lung volume or bd response. No post-bd spiro - next few weeks - restart spiriva respimat - take sample/instruction - look up institute of medicine report on agent orange -> http://www.nationalacademies.org/hmd/Reports/2016/Veterans-and-Agent-Orange-Update-2014.aspx   Nodule of left lung - you hjave not had followup for this in years - repeat CT chest without contrast  Followup   - next few to several weeks but after above   Dr. Brand Males, M.D., [facility].C.P Pulmonary and Critical Care Medicine Staff Physician, [place] Director - Interstitial Lung Disease  Program  Pulmonary [place] at [place], [place], [address]  Pager:  [phone], If no answer or between  15:00h - 7:00h: call [phone] Telephone: [phone]

## 2017-07-13 ENCOUNTER — Ambulatory Visit (INDEPENDENT_AMBULATORY_CARE_PROVIDER_SITE_OTHER)
Admission: RE | Admit: 2017-07-13 | Discharge: 2017-07-13 | Disposition: A | Discharge status: Home / Self Care | Payer: Medicare Other | Source: Ambulatory Visit | Attending: Internal Medicine | Admitting: Internal Medicine

## 2017-07-13 DIAGNOSIS — R911 Solitary pulmonary nodule: Secondary | ICD-10-CM | POA: Diagnosis not present

## 2017-07-13 DIAGNOSIS — R918 Other nonspecific abnormal finding of lung field: Secondary | ICD-10-CM | POA: Diagnosis not present

## 2017-07-14 IMAGING — CT CT HEAD W/O CM
5 of 9 series · 20 of 47 positions shown, 22 images · non-contrast
Comparison: Head CT 03/25/2011

CLINICAL DATA: Fall from ladder and then down a flight of steps.

EXAM:
CT HEAD WITHOUT CONTRAST
CT CERVICAL SPINE WITHOUT CONTRAST
TECHNIQUE: Multidetector CT imaging of the head and cervical spine was
performed following the standard protocol without intravenous
contrast. Multiplanar CT image reconstructions of the cervical spine
were also generated.

[Series 203: coronal st, idose (1) · coronal · 0.40mm/px · 3 of 68 slices shown]
[im 17/68  brain]
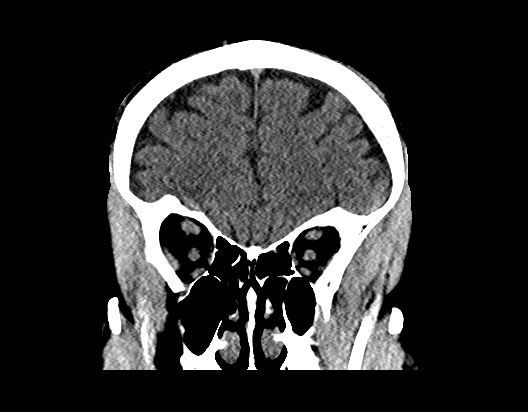
[im 34/68  brain]
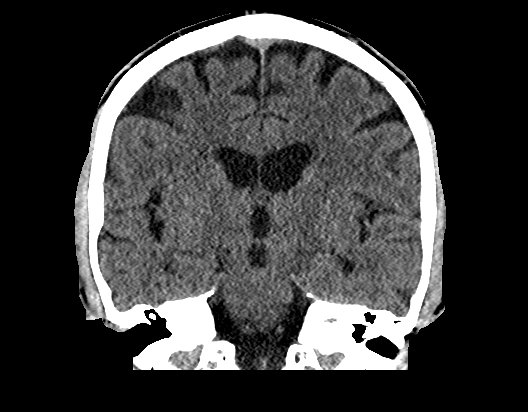
[im 51/68  brain]
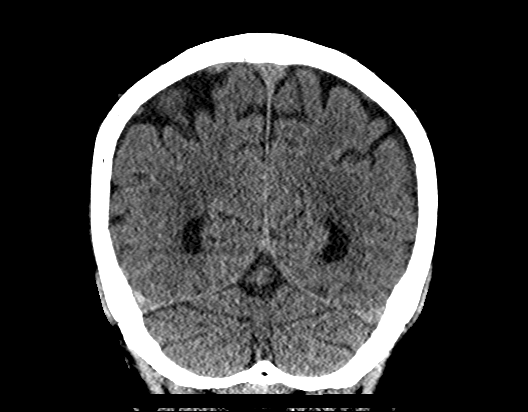

[Series 205: head w/o 2mm, idose (1) · axial · non-contrast · 0.52mm/px · z∈[+305,+393]mm · 5 of 81 slices shown]
[im 14/81  brain]
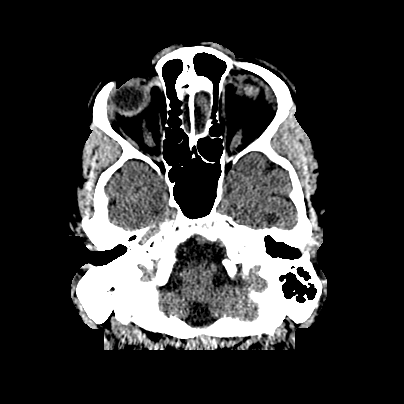
[im 27/81  brain]
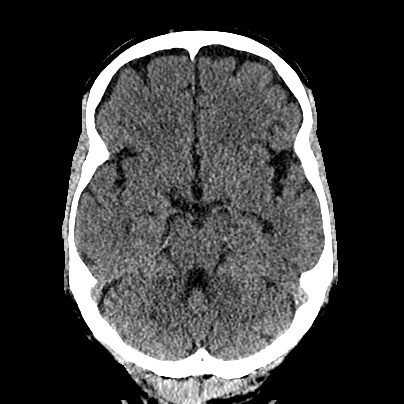
[im 41/81  brain]
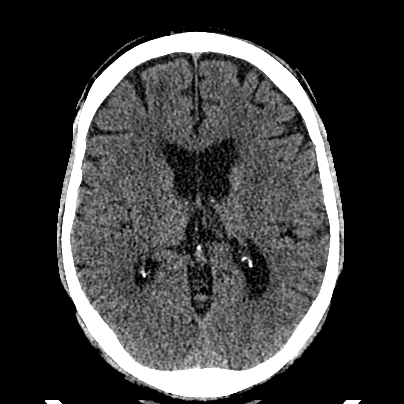
[im 54/81  brain]
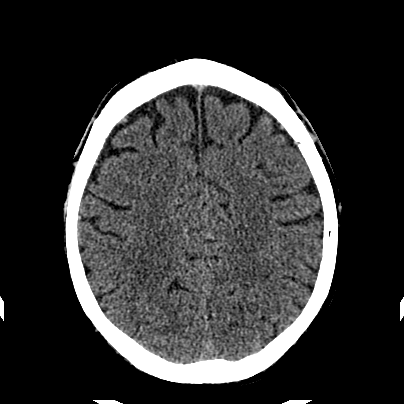
[im 67/81  brain]
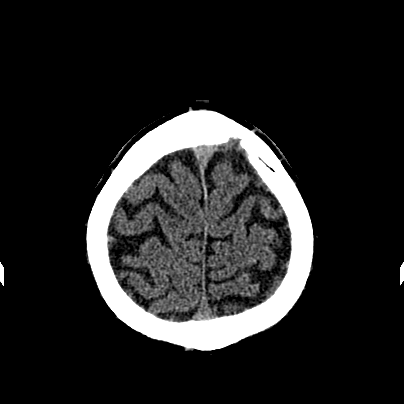

[Series 302: soft tissue, idose (2) · axial · 0.28mm/px · z∈[+83,+207]mm · 6 of 88 slices shown, 8 images]
[im 13/88  brain]
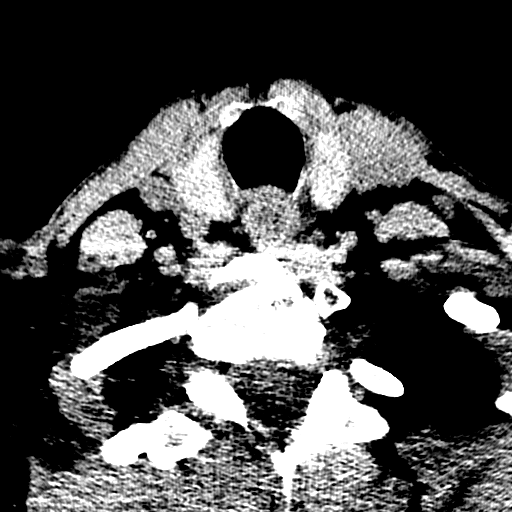
[im 13/88  bone]
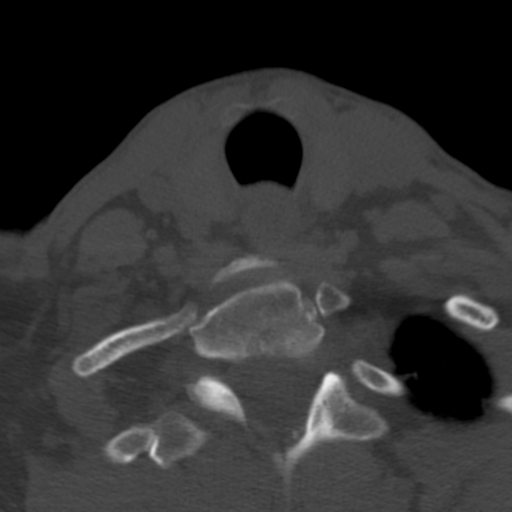
[im 25/88  brain]
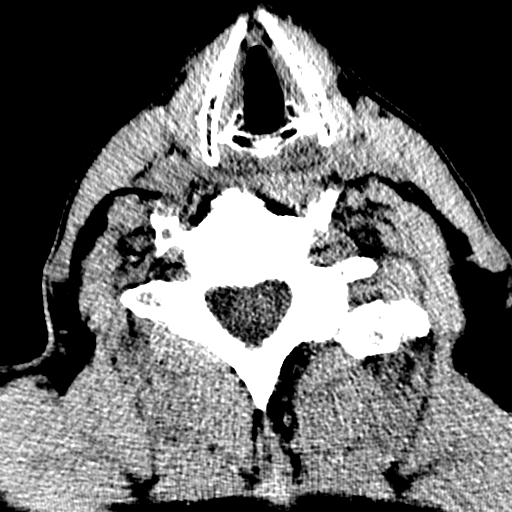
[im 38/88  brain]
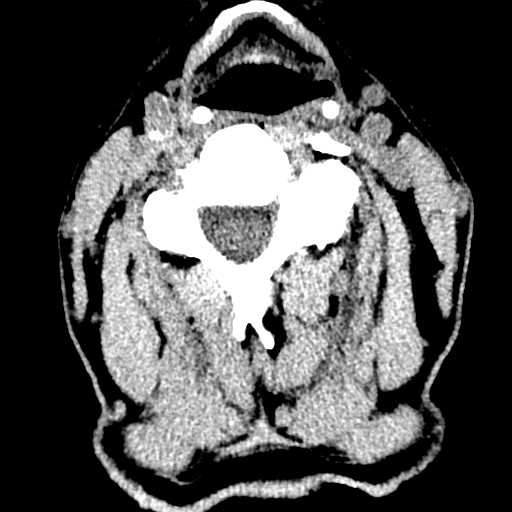
[im 50/88  brain]
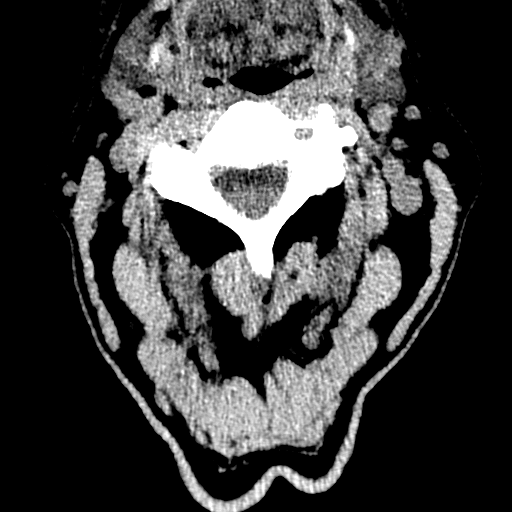
[im 63/88  brain]
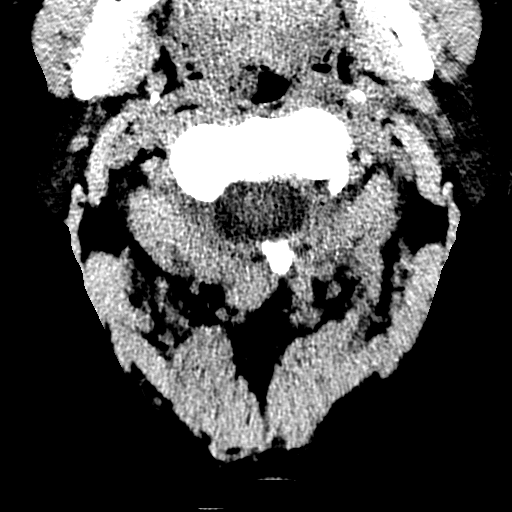
[im 63/88  bone]
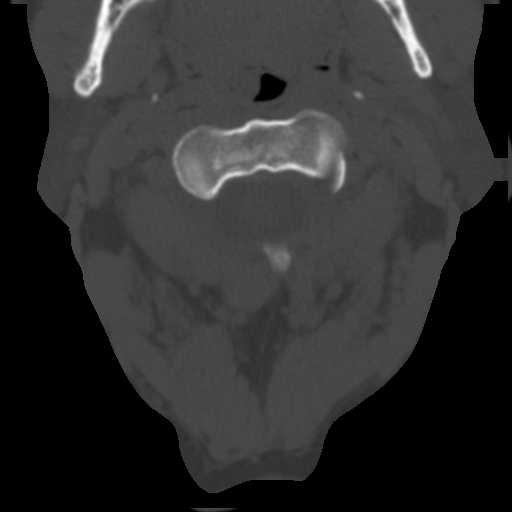
[im 75/88  brain]
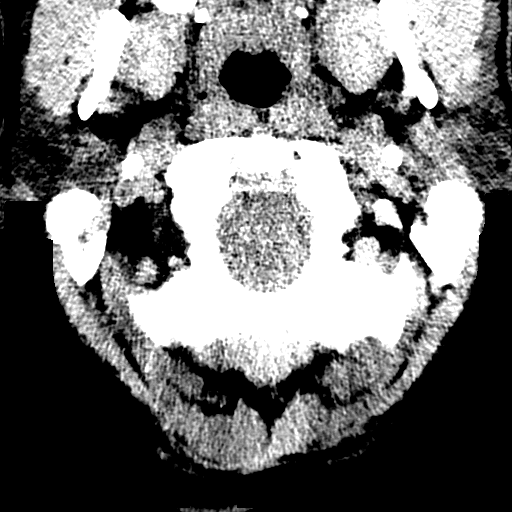

[Series 305: sagittal, idose (2) · sagittal · 0.28mm/px · 2 of 72 slices shown]
[im 24/72  brain]
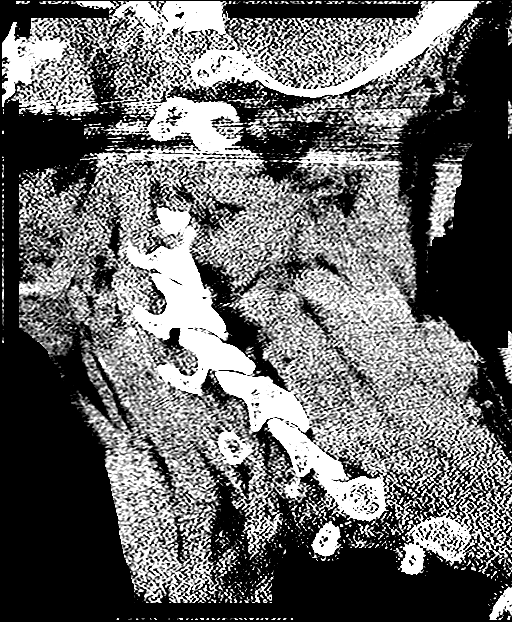
[im 48/72  brain]
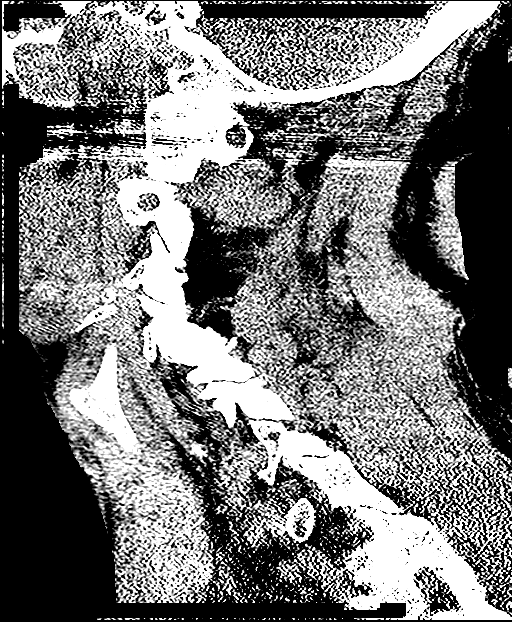

[Series 306: orthogonals, idose (2) · axial · 0.32mm/px · z∈[+142,+213]mm · 4 of 61 slices shown]
[im 13/61  brain]
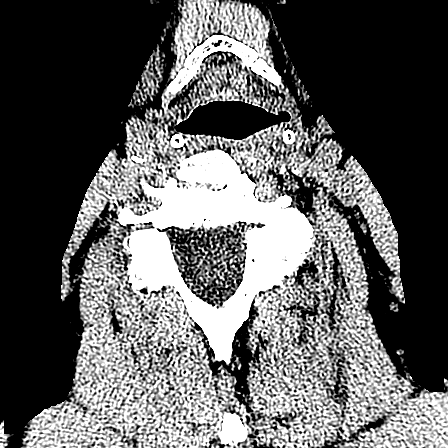
[im 25/61  brain]
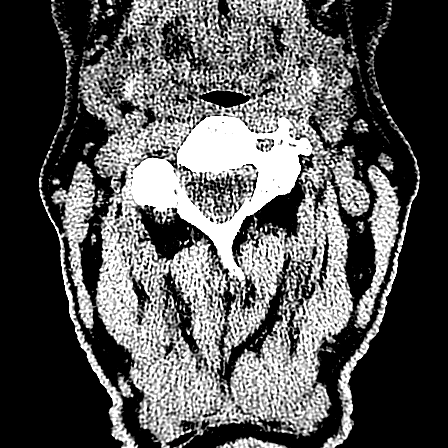
[im 37/61  brain]
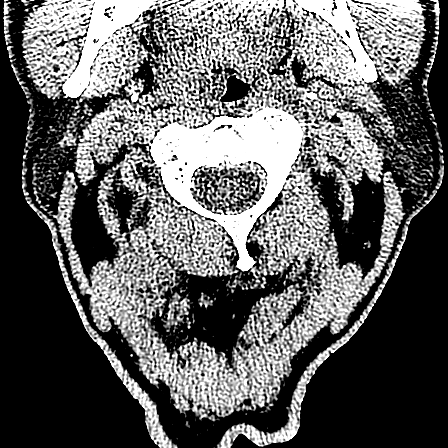
[im 49/61  brain]
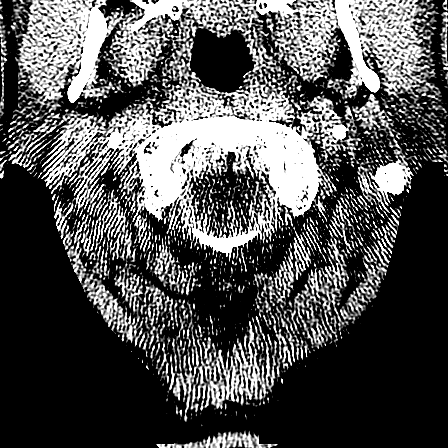

[20 of 47 positions shown; findings below may reference images not displayed]

FINDINGS: CT HEAD FINDINGS

Brain: There is no evidence for acute hemorrhage, hydrocephalus,
mass lesion, or abnormal extra-axial fluid collection. No definite
CT evidence for acute infarction. Diffuse loss of parenchymal volume
is consistent with atrophy. Patchy low attenuation in the deep
hemispheric and periventricular white matter is nonspecific, but
likely reflects chronic microvascular ischemic demyelination.

Vascular: No hyperdense vessel or unexpected calcification.

Skull: No evidence for fracture. No worrisome lytic or sclerotic
lesion.

Sinuses/Orbits: The visualized paranasal sinuses and mastoid air
cells are clear. Visualized portions of the globes and intraorbital
fat are unremarkable.

Other: None.

CT CERVICAL SPINE FINDINGS

Alignment: Accentuated lordosis

Skull base and vertebrae: No evidence of fracture from the skull
base to the T1 vertebral body.

Soft tissues and spinal canal: No prevertebral fluid or swelling. No
visible canal hematoma.

Disc levels: Loss of disc height is seen at C4-5 and C6-7 with
endplate degeneration. Left facets are fused at the C4-5 level with
facet degeneration seen on the left at C5-6.

Upper chest: Unremarkable.

Other: None.
IMPRESSION: 1. No acute intracranial abnormality.
2. Atrophy with chronic small vessel white matter ischemic disease.
3. Degenerative changes in the cervical spine without fracture.

## 2017-07-17 ENCOUNTER — Telehealth: Payer: Self-pay | Admitting: Internal Medicine

## 2017-07-17 NOTE — Telephone Encounter (Signed)
Attempted to contact pt. No answer, no option to leave a message. Will try back.  

## 2017-07-18 ENCOUNTER — Other Ambulatory Visit: Payer: Self-pay

## 2017-07-18 NOTE — Telephone Encounter (Signed)
ATC Lynn back, received a busy tone both times. Will call back later.

## 2017-07-19 NOTE — Telephone Encounter (Signed)
Called patient unable to reach left message to give us a call back.

## 2017-07-19 NOTE — Telephone Encounter (Signed)
Called and spoke with pt's wife Jeani Hawking who stated pt's Spiriva was going to cost them around $400 so they are wanting a different inhaler.  Stated to [place] to call insurance company and get a formulary list of preferred inhalers and either have them fax to our office or bring it to our office for MR to look at so we can see which inhaler is more affordable for pt that is same equilavence as the spiriva.  Will route message to MR as an FYI. Am going to go ahead and discontinue Spiriva Respimat off of pt's med list.  The Rx we sent to pt's pharmacy has been discontinued, but for now have left the sample of spiriva in med list.

## 2017-07-19 NOTE — Telephone Encounter (Signed)
[facility]AMA options -   spiriv, incruse, tudorza for inhalers and then new nebulizer anticholinergic and for nebs - yupelri If this is expensive,t hen through medicare B they can just for atrovent neb 4 times daily   Please give CT chest report - small nodules -0  Unchanged. Benign - emphysema +  - new trace left effusion - will keep an eye  Dr. Brand Males, M.D., [facility].C.P Pulmonary and Critical Care Medicine Staff Physician, [place] Director - Interstitial Lung Disease  Program  Pulmonary [place] at [place], [place], [address]  Pager: [phone], If no answer or between  15:00h - 7:00h: call [phone] Telephone: [phone]      IMPRESSION: Small right upper lobe pulmonary nodules measuring up to 4 mm, unchanged, benign. Given greater than 2 year stability, dedicated follow-up imaging is not required per Fleischner Society guidelines. This recommendation follows the consensus statement: Guidelines for Management of Small Pulmonary Nodules Detected on CT Images: From the Fleischner Society 2017; Radiology 2017; 284:228-243.  Bilateral lower lobe scarring, chronic. Mild lingular scarring/atelectasis.  Trace left pleural effusion, new.  Aortic Atherosclerosis (ICD10-I70.0) and Emphysema (ICD10-J43.9).   Electronically Signed   By: Julian Hy M.D.   On: 07/13/2017 13:50    .

## 2017-07-23 NOTE — Telephone Encounter (Signed)
Called and spoke to patient's wife, Jeani Hawking. Wife reported that they are going to see if the [place] will provide the Spiriva because in the past the [place] has provided inhalers.   Wife requested an earlier appointment and we were able to schedule that for 08/15/17 at 11am for PFT and the 12pm for OV.  Gave wife results of CT per Dr. Marylene Land. Patient's wife does have questions and would like MD to give her a call with more information.   Routing to MR

## 2017-07-25 NOTE — Telephone Encounter (Signed)
Please find out what the questions are and can it wait till OV 08/15/17   Dr. Brand Males, M.D., [facility].C.P Pulmonary and Critical Care Medicine Staff Physician, [place] Director - Interstitial Lung Disease  Program  Pulmonary [place] at [place], [place], [address]  Pager: [phone], If no answer or between  15:00h - 7:00h: call [phone] Telephone: [phone]

## 2017-07-25 NOTE — Telephone Encounter (Signed)
Spoke with [employer]. She stated that she just had general questions about the CT scan results. When asked what type of questions she had, she just stated that some of the wording in the CT scan results were concerning.   She is ok with waiting until Mr. [place] appt but stated if anything serious was going on, she would like a call back. Explained to patient that if anything serious was going on, MR or the office would contact her sooner. She verbalized understanding.   Will route to MR so he is aware that a call is not needed.

## 2017-07-27 ENCOUNTER — Telehealth: Payer: Self-pay | Admitting: Internal Medicine

## 2017-07-27 NOTE — Telephone Encounter (Signed)
Called and spoke to [employer]. She ask for the last OV note to be faxed over to Dr. Felton Clinton at the [facility] and that Dr. Felton Clinton will write the new Rx for patient when she reviews more about patient's history. Faxed over last OV to [phone] attention to Dr. Felton Clinton.  Nothing further is needed at this time.

## 2017-08-01 ENCOUNTER — Telehealth: Payer: Self-pay | Admitting: Internal Medicine

## 2017-08-01 NOTE — Telephone Encounter (Signed)
Left detailed message for Matthew Patrick that I will resend the OV notes to the [place] for the patient.

## 2017-08-03 ENCOUNTER — Other Ambulatory Visit (INDEPENDENT_AMBULATORY_CARE_PROVIDER_SITE_OTHER): Payer: Medicare Other

## 2017-08-03 ENCOUNTER — Ambulatory Visit (INDEPENDENT_AMBULATORY_CARE_PROVIDER_SITE_OTHER)
Admission: RE | Admit: 2017-08-03 | Discharge: 2017-08-03 | Disposition: A | Discharge status: Home / Self Care | Payer: Medicare Other | Source: Ambulatory Visit | Attending: Pulmonary Disease | Admitting: Pulmonary Disease

## 2017-08-03 ENCOUNTER — Ambulatory Visit (INDEPENDENT_AMBULATORY_CARE_PROVIDER_SITE_OTHER): Payer: Medicare Other | Admitting: Pulmonary Disease

## 2017-08-03 ENCOUNTER — Encounter: Payer: Self-pay | Admitting: Pulmonary Disease

## 2017-08-03 VITALS — BP 126/72 | HR 59 | Ht 70.0 in | Wt 158.8 lb

## 2017-08-03 DIAGNOSIS — J90 Pleural effusion, not elsewhere classified: Secondary | ICD-10-CM | POA: Diagnosis not present

## 2017-08-03 DIAGNOSIS — R0600 Dyspnea, unspecified: Secondary | ICD-10-CM

## 2017-08-03 DIAGNOSIS — R001 Bradycardia, unspecified: Secondary | ICD-10-CM | POA: Diagnosis not present

## 2017-08-03 LAB — CBC WITH DIFFERENTIAL/PLATELET
BASOS PCT: 0.5 % (ref 0.0–3.0)
Basophils Absolute: 0 10*3/uL (ref 0.0–0.1)
EOS PCT: 1 % (ref 0.0–5.0)
Eosinophils Absolute: 0.1 10*3/uL (ref 0.0–0.7)
HCT: 39.8 % (ref 39.0–52.0)
HEMOGLOBIN: 13.7 g/dL (ref 13.0–17.0)
Lymphocytes Relative: 14.1 % (ref 12.0–46.0)
Lymphs Abs: 1 10*3/uL (ref 0.7–4.0)
MCHC: 34.3 g/dL (ref 30.0–36.0)
MCV: 93.5 fl (ref 78.0–100.0)
MONOS PCT: 9.8 % (ref 3.0–12.0)
Monocytes Absolute: 0.7 10*3/uL (ref 0.1–1.0)
NEUTROS PCT: 74.6 % (ref 43.0–77.0)
Neutro Abs: 5.3 10*3/uL (ref 1.4–7.7)
Platelets: 219 10*3/uL (ref 150.0–400.0)
RBC: 4.26 Mil/uL (ref 4.22–5.81)
RDW: 12.5 % (ref 11.5–15.5)
WBC: 7.1 10*3/uL (ref 4.0–10.5)

## 2017-08-03 LAB — BASIC METABOLIC PANEL
BUN: 12 mg/dL (ref 6–23)
CALCIUM: 9.1 mg/dL (ref 8.4–10.5)
CO2: 28 meq/L (ref 19–32)
CREATININE: 0.95 mg/dL (ref 0.40–1.50)
Chloride: 96 mEq/L (ref 96–112)
GFR: 82.8 mL/min (ref >60.00)
GLUCOSE: 96 mg/dL (ref 70–99)
Potassium: 3.7 mEq/L (ref 3.5–5.1)
Sodium: 131 mEq/L — ABNORMAL LOW (ref 135–145)

## 2017-08-03 LAB — BRAIN NATRIURETIC PEPTIDE: Pro B Natriuretic peptide (BNP): 96 pg/mL (ref 0.0–100.0)

## 2017-08-03 LAB — D-DIMER, QUANTITATIVE (NOT AT ARMC): D DIMER QUANT: 0.46 ug{FEU}/mL (ref <0.50)

## 2017-08-03 LAB — TROPONIN I: TNIDX: 0 ug/l (ref 0.00–0.06)

## 2017-08-03 MED ORDER — ALBUTEROL SULFATE (2.5 MG/3ML) 0.083% IN NEBU
2.5000 mg | INHALATION_SOLUTION | Freq: Once | RESPIRATORY_TRACT | Status: AC
  Administered 2017-08-03: 2.5 mg via RESPIRATORY_TRACT

## 2017-08-03 NOTE — Patient Instructions (Addendum)
Sinus arrhythmia: Today her EKG showed that your heart rate was slightly slow and you have  and occasional abnormal beat, because of this we are going to refer you to cardiology for evaluation for something called a Holter monitor  Shortness of breath: Albuterol nebulizer treatment here in the office today Use Symbicort 2 puffs twice a day Use albuterol as needed for chest tightness wheezing or shortness of breath Today's blood work was within normal limits  Follow-up with [place] after cardiology if the symptoms have not improved  Addendum to plan at 3:55 PM:  Because you have been having lightheadedness after your head injury I recommend that you go to the emergency room to have this evaluated.

## 2017-08-03 NOTE — Progress Notes (Signed)
Synopsis: Patient of Dr. Chase Caller with OSA, cigarette smoking history, cough  Subjective:   PATIENT ID: Matthew Patrick GENDER: male DOB: 07-Nov-1945, MRN: [id number]   HPI  Chief Complaint  Patient presents with  . Acute Visit    MR pt treated for copd c/o worsening SOB unimproved with albuterol use X1 day.  Denies sinus congestion.    History at 1:30 PM: This is a patient of Dr. Chase Caller who comes to my clinic today for evaluation of shortness of breath.  He says that symptoms started yesterday and were of fairly gradual onset.  He has had a steady progression of shortness of breath.  He is short of breath at rest.  He has been using albuterol to no relief.  He is also been using Symbicort which she says did not help either.  No recent sick contacts.  No fevers, no chills, no body aches, no headaches, no diarrhea.  He has had some chest tightness throughout this time.  He noticed some leg swelling recently.  He says he has been coughing, no mucus production.   His wife says that ever since December he has been "huffing and puffing" more.  Specifically she says that he has been taking deep breaths with nearly all activity.  He was hospitalized for several months in late December for major depression.  He has been receiving weekly ECT treatments since then.  He fell a couple days ago and struck his head.  No recent weakness, numbness, change in mental status or speech.  Addendum history at 3:55 PM: The patient is now complaining of lightheadedness and dizziness.  He did not mention this when he came in.  Nor had he explained this to his wife.  However, he now says he has been having it all day today.   Past Medical History:  Diagnosis Date  . Anxiety   . Asthma   . Depression   . Hyperlipemia   . Hypertension   . Sleep apnea    has c-pap      Family History  Problem Relation Age of Onset  . Depression Brother   . Depression Sister   . Heart disease Father   . Anxiety disorder  Father   . Depression Father   . Emphysema Mother   . Allergies Mother      Social History   Socioeconomic History  . Marital status: Significant Other    Spouse name: Not on file  . Number of children: Not on file  . Years of education: Not on file  . Highest education level: Not on file  Occupational History  . Occupation: retired  Scientific laboratory technician  . Financial resource strain: Not on file  . Food insecurity:    Worry: Not on file    Inability: Not on file  . Transportation needs:    Medical: Not on file    Non-medical: Not on file  Tobacco Use  . Smoking status: Former Smoker    Packs/day: 1.00    Years: 5.00    Pack years: 5.00    Types: Cigarettes    Last attempt to quit: 04/25/1967    Years since quitting: 50.3  . Smokeless tobacco: Never Used  Substance and Sexual Activity  . Alcohol use: No  . Drug use: No  . Sexual activity: Never  Lifestyle  . Physical activity:    Days per week: Not on file    Minutes per session: Not on file  . Stress: Not on file  Relationships  . Social connections:    Talks on phone: Not on file    Gets together: Not on file    Attends religious service: Not on file    Active member of club or organization: Not on file    Attends meetings of clubs or organizations: Not on file    Relationship status: Not on file  . Intimate partner violence:    Fear of current or ex partner: Not on file    Emotionally abused: Not on file    Physically abused: Not on file    Forced sexual activity: Not on file  Other Topics Concern  . Not on file  Social History Narrative  . Not on file     No Known Allergies   Outpatient Medications Prior to Visit  Medication Sig Dispense Refill  . albuterol (PROVENTIL HFA;VENTOLIN HFA) 108 (90 Base) MCG/ACT inhaler Inhale into the lungs.    Marland Kitchen amLODipine (NORVASC) 5 MG tablet Take 5 mg by mouth every morning.    Marland Kitchen aspirin 81 MG tablet Take 81 mg by mouth daily.    . B Complex-C (B-COMPLEX WITH VITAMIN C)  tablet Take by mouth.    . fluticasone (FLONASE) 50 MCG/ACT nasal spray 2 sprays by Both Nostrils route daily as needed.    . loratadine (CLARITIN) 10 MG tablet Take 10 mg by mouth every morning.    Marland Kitchen LORazepam (ATIVAN) 0.5 MG tablet Take 0.5 tablets (0.25 mg total) by mouth 4 (four) times daily. (Patient taking differently: Take 0.25 mg by mouth 4 (four) times daily as needed for anxiety. ) 30 tablet 0  . Melatonin 5 MG CAPS Take by mouth.    [name] Glycol-Propyl Glycol (SYSTANE) 0.4-0.3 % GEL ophthalmic gel Place 1 drop into both eyes 2 (two) times daily.    . Suvorexant (BELSOMRA) 20 MG TABS Take 20 mg by mouth at bedtime.    . tamsulosin (FLOMAX) 0.4 MG CAPS capsule Take 0.4 mg by mouth at bedtime.    . traZODone (DESYREL) 50 MG tablet Take 40 mg by mouth at bedtime.    . Tiotropium Bromide Monohydrate (SPIRIVA RESPIMAT) 2.5 MCG/ACT AERS Inhale 2 puffs into the lungs daily for 1 day. 1 Inhaler 0   No facility-administered medications prior to visit.     Review of Systems  Constitutional: Negative for chills, diaphoresis and malaise/fatigue.  HENT: Negative for ear discharge, ear pain, nosebleeds and sinus pain.   Respiratory: Positive for cough, shortness of breath and wheezing. Negative for hemoptysis, sputum production and stridor.   Cardiovascular: Positive for chest pain. Negative for orthopnea and claudication.      Objective:  Physical Exam   Vitals:   08/03/17 1328  BP: 126/72  Pulse: (!) 59  SpO2: 100%  Weight: 158 lb 12.8 oz (72 kg)  Height: 5\' 10"  (1.778 m)    Gen: well appearing HENT: OP clear, TM's clear, neck supple, mild scalp scab, well healed PULM: CTA B, increased breathing rate but no increased work of breathing CV: RRR, no mgr, notable ankle edema GI: BS+, soft, nontender Derm: no cyanosis or rash Psyche: normal mood and affect   CBC    Component Value Date/Time   WBC 6.4 01/11/2017 1708   RBC 3.59 (L) 01/11/2017 1708   HGB 10.9 (L)  01/11/2017 1721   HCT 32.0 (L) 01/11/2017 1721   PLT 209 01/11/2017 1708   MCV 90.8 01/11/2017 1708   MCH 31.8 01/11/2017 1708   MCHC 35.0 01/11/2017  1708   RDW 12.7 01/11/2017 1708   LYMPHSABS 0.3 (L) 09/12/2016 1636   MONOABS 0.7 09/12/2016 1636   EOSABS 0.0 09/12/2016 1636   BASOSABS 0.0 09/12/2016 1636     Chest imaging: August 03, 2017 chest x-ray images independently reviewed showing:  PFT:  Labs:  Path:  Echo:  Heart Catheterization:       Assessment & Plan:   Dyspnea, unspecified type - Plan: Troponin I, B Nat Peptide, Basic Metabolic Panel (BMET), CBC w/Diff, D-Dimer, Quantitative, DG Chest 2 View, EKG 12-Lead  Discussion (57:69 PM): 72 year old male comes to our clinic today with a complaint of shortness of breath.  He has a normal physical exam, no recent cough or mucus production.  He does have some edema on exam.  I do not feel that he is having a COPD exacerbation and given the chest tightness he has experienced in leg swelling I suspect a cardiac abnormality.  We will try to get some lab work and x-rays done here in the office to try to get to the bottom of what is going on.  2:49 PM update: Chest x-ray is back and shows hyperinflation but no abnormality  3:19 PM update: Blood work is within normal limits  Plan: Sinus arrhythmia: Today her EKG showed that your heart rate was slightly slow and you have  and occasional abnormal beat, because of this we are going to refer you to cardiology for evaluation for something called a Holter monitor  Shortness of breath: Albuterol nebulizer treatment here in the office today Use Symbicort 2 puffs twice a day Use albuterol as needed for chest tightness wheezing or shortness of breath Today's blood work was within normal limits  Follow-up with [place] after cardiology if the symptoms have not improved  > 35 minutes spent with patient, 1 hour visit  Addendum to plan at 3:55 PM:  Because you have been having  lightheadedness after your head injury I recommend that you go to the emergency room to have this evaluated.  (Note that I came back in and spent more time with the patient to assess this history.  He says that the lightheadedness has been a problem today, this is the first his wife had heard of this as well.  I explained to him that if he is having lightheadedness after head injury he needs to go to the emergency room to have this evaluated further.  He refused.  He wants to go home.  I recommended against this and told him to go to the emergency room.)   Current Outpatient Medications:  .  albuterol (PROVENTIL HFA;VENTOLIN HFA) 108 (90 Base) MCG/ACT inhaler, Inhale into the lungs., Disp: , Rfl:  .  amLODipine (NORVASC) 5 MG tablet, Take 5 mg by mouth every morning., Disp: , Rfl:  .  aspirin 81 MG tablet, Take 81 mg by mouth daily., Disp: , Rfl:  .  B Complex-C (B-COMPLEX WITH VITAMIN C) tablet, Take by mouth., Disp: , Rfl:  .  fluticasone (FLONASE) 50 MCG/ACT nasal spray, 2 sprays by Both Nostrils route daily as needed., Disp: , Rfl:  .  loratadine (CLARITIN) 10 MG tablet, Take 10 mg by mouth every morning., Disp: , Rfl:  .  LORazepam (ATIVAN) 0.5 MG tablet, Take 0.5 tablets (0.25 mg total) by mouth 4 (four) times daily. (Patient taking differently: Take 0.25 mg by mouth 4 (four) times daily as needed for anxiety. ), Disp: 30 tablet, Rfl: 0 .  Melatonin 5 MG CAPS, Take by  mouth., Disp: , Rfl:  .  Polyethyl Glycol-Propyl Glycol (SYSTANE) 0.4-0.3 % GEL ophthalmic gel, Place 1 drop into both eyes 2 (two) times daily., Disp: , Rfl:  .  Suvorexant (BELSOMRA) 20 MG TABS, Take 20 mg by mouth at bedtime., Disp: , Rfl:  .  tamsulosin (FLOMAX) 0.4 MG CAPS capsule, Take 0.4 mg by mouth at bedtime., Disp: , Rfl:  .  traZODone (DESYREL) 50 MG tablet, Take 40 mg by mouth at bedtime., Disp: , Rfl:  .  Tiotropium Bromide Monohydrate (SPIRIVA RESPIMAT) 2.5 MCG/ACT AERS, Inhale 2 puffs into the lungs daily for 1  day., Disp: 1 Inhaler, Rfl: 0

## 2017-08-04 ENCOUNTER — Emergency Department (HOSPITAL_COMMUNITY): Payer: Medicare Other

## 2017-08-04 ENCOUNTER — Emergency Department (HOSPITAL_COMMUNITY)
Admission: EM | Admit: 2017-08-04 | Discharge: 2017-08-04 | Disposition: A | Discharge status: Home / Self Care | Payer: Medicare Other | Attending: Emergency Medicine | Admitting: Emergency Medicine

## 2017-08-04 ENCOUNTER — Encounter (HOSPITAL_COMMUNITY): Payer: Self-pay | Admitting: Emergency Medicine

## 2017-08-04 ENCOUNTER — Other Ambulatory Visit: Payer: Self-pay

## 2017-08-04 DIAGNOSIS — J45909 Unspecified asthma, uncomplicated: Secondary | ICD-10-CM | POA: Insufficient documentation

## 2017-08-04 DIAGNOSIS — W1830XA Fall on same level, unspecified, initial encounter: Secondary | ICD-10-CM | POA: Insufficient documentation

## 2017-08-04 DIAGNOSIS — Z79899 Other long term (current) drug therapy: Secondary | ICD-10-CM | POA: Diagnosis not present

## 2017-08-04 DIAGNOSIS — Y9289 Other specified places as the place of occurrence of the external cause: Secondary | ICD-10-CM | POA: Diagnosis not present

## 2017-08-04 DIAGNOSIS — R42 Dizziness and giddiness: Secondary | ICD-10-CM | POA: Diagnosis not present

## 2017-08-04 DIAGNOSIS — Y9389 Activity, other specified: Secondary | ICD-10-CM | POA: Diagnosis not present

## 2017-08-04 DIAGNOSIS — R0602 Shortness of breath: Secondary | ICD-10-CM | POA: Diagnosis not present

## 2017-08-04 DIAGNOSIS — R270 Ataxia, unspecified: Secondary | ICD-10-CM | POA: Diagnosis not present

## 2017-08-04 DIAGNOSIS — W19XXXA Unspecified fall, initial encounter: Secondary | ICD-10-CM

## 2017-08-04 DIAGNOSIS — Z87891 Personal history of nicotine dependence: Secondary | ICD-10-CM | POA: Insufficient documentation

## 2017-08-04 DIAGNOSIS — S0990XA Unspecified injury of head, initial encounter: Secondary | ICD-10-CM | POA: Diagnosis not present

## 2017-08-04 DIAGNOSIS — R5383 Other fatigue: Secondary | ICD-10-CM | POA: Diagnosis not present

## 2017-08-04 DIAGNOSIS — Y999 Unspecified external cause status: Secondary | ICD-10-CM | POA: Insufficient documentation

## 2017-08-04 DIAGNOSIS — I10 Essential (primary) hypertension: Secondary | ICD-10-CM | POA: Insufficient documentation

## 2017-08-04 DIAGNOSIS — R531 Weakness: Secondary | ICD-10-CM | POA: Diagnosis not present

## 2017-08-04 LAB — CBC
HCT: 38.8 % — ABNORMAL LOW (ref 39.0–52.0)
HEMOGLOBIN: 13.2 g/dL (ref 13.0–17.0)
MCH: 31.2 pg (ref 26.0–34.0)
MCHC: 34 g/dL (ref 30.0–36.0)
MCV: 91.7 fL (ref 78.0–100.0)
Platelets: 229 10*3/uL (ref 150–400)
RBC: 4.23 MIL/uL (ref 4.22–5.81)
RDW: 12.3 % (ref 11.5–15.5)
WBC: 5.6 10*3/uL (ref 4.0–10.5)

## 2017-08-04 LAB — BASIC METABOLIC PANEL
ANION GAP: 7 (ref 5–15)
BUN: 10 mg/dL (ref 6–20)
CALCIUM: 9.5 mg/dL (ref 8.9–10.3)
CO2: 24 mmol/L (ref 22–32)
Chloride: 99 mmol/L — ABNORMAL LOW (ref 101–111)
Creatinine, Ser: 0.84 mg/dL (ref 0.61–1.24)
GFR calc Af Amer: >60 mL/min (ref >60)
GLUCOSE: 99 mg/dL (ref 65–99)
Potassium: 4 mmol/L (ref 3.5–5.1)
Sodium: 130 mmol/L — ABNORMAL LOW (ref 135–145)

## 2017-08-04 LAB — I-STAT TROPONIN, ED: TROPONIN I, POC: 0 ng/mL (ref 0.00–0.08)

## 2017-08-04 MED ORDER — MECLIZINE HCL 12.5 MG PO TABS: 12.5000 mg | ORAL_TABLET | Freq: Three times a day (TID) | ORAL | 0 refills | Status: DC | PRN

## 2017-08-04 MED ORDER — DEXAMETHASONE 4 MG PO TABS
10.0000 mg | ORAL_TABLET | Freq: Once | ORAL | Status: AC
Start: 2017-08-04 — End: 2017-08-04
  Administered 2017-08-04: 10 mg via ORAL
  Filled 2017-08-04: qty 3

## 2017-08-04 MED ORDER — ALBUTEROL SULFATE (2.5 MG/3ML) 0.083% IN NEBU
5.0000 mg | INHALATION_SOLUTION | Freq: Once | RESPIRATORY_TRACT | Status: DC
  Filled 2017-08-04: qty 6

## 2017-08-04 NOTE — ED Notes (Signed)
Patient transported to CT 

## 2017-08-04 NOTE — ED Notes (Signed)
Roomed patient, placed him in a gown, and gave call bell.

## 2017-08-04 NOTE — Discharge Instructions (Addendum)
Continue to take the Antivert as directed.  Follow-up with neurology if symptoms do not resolve.  Return for any new or worse symptoms.  Return for development of any chest pain.  Return for any worsening shortness of breath.  Chest x-ray seemed to show some evidence of COPD that may explain the shortness of breath.  Continue to follow-up with pulmonary medicine.  Symptoms most likely consistent with a concussion.

## 2017-08-04 NOTE — ED Provider Notes (Signed)
[place] EMERGENCY DEPARTMENT Provider Note   CSN: [id number] Arrival date & time: 08/04/17  1032     History   Chief Complaint Chief Complaint  Patient presents with  . Dizziness  . Shortness of Breath  . Chest Pain    HPI Matthew Patrick is a 72 y.o. male.  Patient presents today with a complaint of a fall and hitting his head on Tuesday.  Also has had shortness of breath for 2 days and has had intermittent dizziness since right after the fall.  No true vertigo.  No chest pain.  Seen by pulmonary medicine yesterday for the shortness of breath and started on albuterol but does not feel any better.  Oxygen saturations on presentation were 94%.  Patient is usually followed at the [facility] clinic.  He is also followed by neurology.  And is also had persistent depression and receives electric shock therapy at [facility].  He had that on Wednesday he gets some anesthesia for that he was out in his [place]ty[place] because he could not sleep and he was cleaning things up and he bent over lost his balance and hit his head.  Since that time he has had dizziness.  Has an abrasion to the left forehead scalp area tetanus is up-to-date.  Wife did not note any significant neuro deficits.  Nothing out of the ordinary.  Patient is not on blood thinners.  Patient denies any loss of consciousness..      Past Medical History:  Diagnosis Date  . Anxiety   . Asthma   . Depression   . Hyperlipemia   . Hypertension   . Sleep apnea    has c-pap     Patient Active Problem List   Diagnosis Date Noted  . Generalized weakness 09/22/2015  . Depression 09/22/2015  . Anemia 09/22/2015  . Obsessive compulsive disorder 09/22/2015  . Generalized anxiety disorder 09/22/2015  . SIADH (syndrome of inappropriate ADH production) ([place]) 09/21/2015  . Hyponatremia 09/20/2015  . Severe recurrent major depression with psychotic features ([place]) 09/19/2015  . Major depressive disorder, recurrent  severe without psychotic features ([place]) 09/17/2015  . Suicidal ideation 09/17/2015  . Cough 01/19/2015  . Restless leg 01/19/2015  . COLD (chronic obstructive lung disease) ([place]) 05/22/2014  . Lung nodule < 6cm on CT 05/22/2014  . OSA (obstructive sleep apnea) 07/11/2013  . Lung nodule 05/11/2013  . Nocturnal hypoxemia 05/11/2013  . Obstructive lung disease ([place]) 04/17/2013  . Chronic cough 04/17/2013  . Syncope 03/25/2011  . Hypertension 03/25/2011  . Hypokalemia 03/25/2011  . Asthma 03/25/2011  . Sciatica 03/25/2011  . SOB (shortness of breath) 03/25/2011    Past Surgical History:  Procedure Laterality Date  . 2D Echocardiogram  03/27/11  . CATARACT EXTRACTION    . Neck skin tag biopsy    . TONSILLECTOMY AND ADENOIDECTOMY          Home Medications    Prior to Admission medications   Medication Sig Start Date End Date Taking? Authorizing Provider  albuterol (PROVENTIL HFA;VENTOLIN HFA) 108 (90 Base) MCG/ACT inhaler Inhale into the lungs.   Yes [provider]  amLODipine (NORVASC) 5 MG tablet Take 5 mg by mouth every morning.   Yes [provider]  aspirin 81 MG tablet Take 81 mg by mouth daily.   Yes [provider]  B Complex-C (B-COMPLEX WITH VITAMIN C) tablet Take by mouth.   Yes [provider]  fluticasone (FLONASE) 50 MCG/ACT nasal spray 2 sprays  by Both Nostrils route daily as needed.   Yes [provider]  loratadine (CLARITIN) 10 MG tablet Take 10 mg by mouth every morning.   Yes [provider]  LORazepam (ATIVAN) 0.5 MG tablet Take 0.5 tablets (0.25 mg total) by mouth 4 (four) times daily. Patient taking differently: Take 0.25 mg by mouth 4 (four) times daily as needed for anxiety.  09/22/15  Yes Theodoro Grist, MD  Melatonin 10 MG TABS Take 10 mg by mouth at bedtime.    Yes [provider]  Misc Natural Products (PROSTATE HEALTH) CAPS Take 1 capsule by mouth daily.   Yes [provider]    Multiple Vitamins-Minerals (EYE HEALTH PO) Take 1 tablet by mouth daily.   Yes [provider]  Polyethyl Glycol-Propyl Glycol (SYSTANE) 0.4-0.3 % GEL ophthalmic gel Place 1 drop into both eyes 2 (two) times daily.   Yes [provider]  Suvorexant (BELSOMRA) 20 MG TABS Take 20 mg by mouth at bedtime.   Yes [provider]  tamsulosin (FLOMAX) 0.4 MG CAPS capsule Take 0.4 mg by mouth at bedtime.   Yes [provider]  Tiotropium Bromide Monohydrate (SPIRIVA RESPIMAT) 2.5 MCG/ACT AERS Inhale 2 puffs into the lungs daily for 1 day. 07/05/17 08/04/17 Yes Brand Males, MD  traZODone (DESYREL) 50 MG tablet Take 40 mg by mouth at bedtime.   Yes [provider]  meclizine (ANTIVERT) 12.5 MG tablet Take 1 tablet (12.5 mg total) by mouth 3 (three) times daily as needed for dizziness. 08/04/17   Fredia Sorrow, MD    Family History Family History  Problem Relation Age of Onset  . Depression Brother   . Depression Sister   . Heart disease Father   . Anxiety disorder Father   . Depression Father   . Emphysema Mother   . Allergies Mother     Social History Social History   Tobacco Use  . Smoking status: Former Smoker    Packs/day: 1.00    Years: 5.00    Pack years: 5.00    Types: Cigarettes    Last attempt to quit: 04/25/1967    Years since quitting: 50.3  . Smokeless tobacco: Never Used  Substance Use Topics  . Alcohol use: No  . Drug use: No     Allergies   Patient has no known allergies.   Review of Systems Review of Systems  Constitutional: Positive for fatigue. Negative for fever.  HENT: Negative for congestion.   Eyes: Negative for visual disturbance.  Respiratory: Negative for shortness of breath.   Cardiovascular: Negative for chest pain.  Gastrointestinal: Negative for abdominal pain.  Musculoskeletal: Negative for back pain and neck pain.  Skin: Positive for wound. Negative for rash.  Neurological: Positive for  dizziness and weakness. Negative for syncope and headaches.  Hematological: Does not bruise/bleed easily.  Psychiatric/Behavioral: Positive for sleep disturbance. Negative for confusion.     Physical Exam Updated Vital Signs BP (!) 157/76   Pulse (!) 57   Temp 97.7 F (36.5 C) (Oral)   Resp 20   SpO2 98%   Physical Exam  Constitutional: He is oriented to person, place, and time. He appears well-developed and well-nourished. No distress.  HENT:  Head: Normocephalic.  Mouth/Throat: Oropharynx is clear and moist.  Large abrasion left forehead scalp area appears to be healing well.  Scabbed over.  Probably measures about 7 cm.  No laceration.  Eyes: Pupils are equal, round, and reactive to light. Conjunctivae and EOM are  normal.  Neck: Normal range of motion. Neck supple.  Cardiovascular: Normal rate, regular rhythm and normal heart sounds.  Pulmonary/Chest: Effort normal and breath sounds normal. No respiratory distress. He has no wheezes. He has no rales.  Abdominal: Soft. Bowel sounds are normal.  Musculoskeletal: Normal range of motion.  Trace bilateral leg edema.  Neurological: He is alert and oriented to person, place, and time. No cranial nerve deficit or sensory deficit. He exhibits normal muscle tone. Coordination normal.  Some tremor bilaterally.  No significant neuro focal deficit.  Skin: Skin is warm.  Nursing note and vitals reviewed.    ED Treatments / Results  Labs (all labs ordered are listed, but only abnormal results are displayed) Labs Reviewed  BASIC METABOLIC PANEL - Abnormal; Notable for the following components:      Result Value   Sodium 130 (*)    Chloride 99 (*)    All other components within normal limits  CBC - Abnormal; Notable for the following components:   HCT 38.8 (*)    All other components within normal limits  I-STAT TROPONIN, ED    EKG EKG Interpretation  Date/Time:  Saturday August 04 2017 10:39:43 EDT Ventricular Rate:  69 PR  Interval:  122 QRS Duration: 84 QT Interval:  388 QTC Calculation: 415 R Axis:   72 Text Interpretation:  Normal sinus rhythm Normal ECG No significant change since last tracing Confirmed by Fredia Sorrow [phone]) on 08/04/2017 2:07:30 PM   Radiology Dg Chest 2 View  Result Date: 08/04/2017 CLINICAL DATA:  Fall on Tuesday, dizziness and shortness of breath since. EXAM: CHEST - 2 VIEW COMPARISON:  Chest x-ray dated 08/03/2017. FINDINGS: Heart size and mediastinal contours are within normal limits. Lungs are hyperexpanded. Lungs are clear. No pleural effusion or pneumothorax seen. No acute osseous fracture or dislocation seen. Chronic mild compression deformity of a lower thoracic vertebral body. IMPRESSION: 1. No active cardiopulmonary disease. No evidence of pneumonia or pulmonary edema. 2. Hyperexpanded lungs indicating COPD. 3. No acute appearing osseous fracture or dislocation. Old mild compression deformity of a lower thoracic vertebral body. Electronically Signed   By: Franki Cabot M.D.   On: 08/04/2017 11:31   Dg Chest 2 View  Result Date: 08/03/2017 CLINICAL DATA:  Dyspnea EXAM: CHEST - 2 VIEW COMPARISON:  CT chest 07/13/2017, radiograph 01/11/2017 FINDINGS: Hyperinflation with small left pleural effusion or thickening. No acute airspace disease. Normal heart size. No pneumothorax. Minimal superior endplate deformity of a mid to upper thoracic vertebra. IMPRESSION: 1. Trace left pleural effusion or thickening 2. No focal airspace disease Electronically Signed   By: Donavan Foil M.D.   On: 08/03/2017 14:29   Ct Head Wo Contrast  Result Date: 08/04/2017 CLINICAL DATA:  Dizziness and ataxia post fall, injury to LEFT parietal region on April 9th. EXAM: CT HEAD WITHOUT CONTRAST TECHNIQUE: Contiguous axial images were obtained from the base of the skull through the vertex without intravenous contrast. COMPARISON:  Head CT dated 01/11/2017. FINDINGS: Brain: Ventricles are stable in size and  configuration. There is no mass, hemorrhage, edema or other evidence of acute parenchymal abnormality. No extra-axial hemorrhage. Vascular: No hyperdense vessel or unexpected calcification. Skull: Normal. Negative for fracture or focal lesion. Sinuses/Orbits: No acute finding. Other: None. IMPRESSION: Negative head CT.  No intracranial mass, hemorrhage or edema. Electronically Signed   By: Franki Cabot M.D.   On: 08/04/2017 14:40    Procedures Procedures (including critical care time)  Medications Ordered in ED Medications -  No data to display   Initial Impression / Assessment and Plan / ED Course  I have reviewed the triage vital signs and the nursing notes.  Pertinent labs & imaging results that were available during my care of the patient were reviewed by me and considered in my medical decision making (see chart for details).     Patient most likely has a postconcussive syndrome.  However since the circumstances were not witnessed it is possible that there could have been a stroke that led to him falling over hitting his head and that the dizziness that is persisted since immediately after the fall was related to that so patient will get MRI brain since head CT negative.  Rest of workup without any significant findings.  EKG very normal initial troponin negative however there was no chest pain.  Shortness of breath probably related to COPD chest x-ray shows some COPD changes no wheezing here not hypoxic.  Patient has follow-up available with neurology and the [place].  If MRI negative will treat with Antivert and have him follow-up with neurology.  MRI results still pending.  Final Clinical Impressions(s) / ED Diagnoses   Final diagnoses:  Dizziness  Fall, initial encounter  Injury of head, initial encounter    ED Discharge Orders        Ordered    meclizine (ANTIVERT) 12.5 MG tablet  3 times daily PRN     08/04/17 1538       Fredia Sorrow, MD 08/04/17 1555

## 2017-08-04 NOTE — ED Triage Notes (Signed)
Wife stated, I fell on Tuesday and since then Ive had some dizziness and SOB.

## 2017-08-04 NOTE — ED Provider Notes (Signed)
Assumed care at 4 PM. 72 yo here with fall on Wednesday night after ECT (at [facility], long-standing depression). Large contusion and abrasion on head. Since then, he's had dizziness, not true vertigo. H/o some gait problems in past with ? Parkinson's. Head CT neg. Labs unremarkable. EKG, CXR unremarkable. MR pending - unclear how he was feeling before he fell over.   MR negative.  Patient ambulatory in ED without difficulty.  Regarding his shortness of breath, EKG negative, troponin negative, suspect this may be his asthma.  I will give him a dose of Decadron and breathing treatment.  He is satting well.  Will discharge with outpatient follow-up with his pulmonologist and neurologist.  Discussed concussion return precautions and treatment.   Duffy Bruce, MD 08/04/17 1740

## 2017-08-10 ENCOUNTER — Other Ambulatory Visit (HOSPITAL_COMMUNITY): Payer: Self-pay | Admitting: Specialist

## 2017-08-10 DIAGNOSIS — G20 Parkinson's disease: Secondary | ICD-10-CM

## 2017-08-15 ENCOUNTER — Ambulatory Visit: Payer: Self-pay | Admitting: Internal Medicine

## 2017-08-21 ENCOUNTER — Ambulatory Visit (INDEPENDENT_AMBULATORY_CARE_PROVIDER_SITE_OTHER): Payer: Medicare Other | Admitting: Internal Medicine

## 2017-08-21 ENCOUNTER — Ambulatory Visit: Payer: Self-pay | Admitting: Internal Medicine

## 2017-08-21 ENCOUNTER — Encounter: Payer: Self-pay | Admitting: Internal Medicine

## 2017-08-21 VITALS — BP 124/72 | HR 60 | Ht 70.0 in | Wt 158.6 lb

## 2017-08-21 DIAGNOSIS — J449 Chronic obstructive pulmonary disease, unspecified: Secondary | ICD-10-CM | POA: Diagnosis not present

## 2017-08-21 DIAGNOSIS — Z77098 Contact with and (suspected) exposure to other hazardous, chiefly nonmedicinal, chemicals: Secondary | ICD-10-CM

## 2017-08-21 DIAGNOSIS — R911 Solitary pulmonary nodule: Secondary | ICD-10-CM | POA: Diagnosis not present

## 2017-08-21 DIAGNOSIS — Z7739 Contact with and (suspected) exposure to other war theater: Secondary | ICD-10-CM

## 2017-08-21 DIAGNOSIS — R42 Dizziness and giddiness: Secondary | ICD-10-CM | POA: Diagnosis not present

## 2017-08-21 LAB — PULMONARY FUNCTION TEST
DL/VA % PRED: 77 %
DL/VA: 3.55 ml/min/mmHg/L
DLCO UNC % PRED: 66 %
DLCO UNC: 21.56 ml/min/mmHg
FEF 25-75 Pre: 1.18 L/sec
FEF2575-%PRED-PRE: 49 %
FEV1-%Pred-Pre: 67 %
FEV1-Pre: 2.12 L
FEV1FVC-%PRED-PRE: 77 %
FEV6-%Pred-Pre: 89 %
FEV6-PRE: 3.64 L
FEV6FVC-%Pred-Pre: 103 %
FVC-%PRED-PRE: 86 %
FVC-PRE: 3.74 L
PRE FEV6/FVC RATIO: 97 %
Pre FEV1/FVC ratio: 57 %

## 2017-08-21 MED ORDER — FLUTICASONE FUROATE-VILANTEROL 100-25 MCG/INH IN AEPB: 1.0000 | INHALATION_SPRAY | Freq: Every day | RESPIRATORY_TRACT | 5 refills | Status: AC

## 2017-08-21 MED ORDER — FLUTICASONE FUROATE-VILANTEROL 100-25 MCG/INH IN AEPB: 1.0000 | INHALATION_SPRAY | Freq: Every day | RESPIRATORY_TRACT | 0 refills | Status: DC

## 2017-08-21 NOTE — Patient Instructions (Addendum)
COPD, moderate ([place])  - copd stable with PFT stability 2014 -> 2019 - but there is ongoing excess shortness of breath despite spiriva - So, add breo (symbicort not working for you subjectively) daily - Use albuterol as needed - cards referral to rule out cardiac cause of dyspnea - try to get into pulm rehab when possible   H/O agent Orange exposure - possible that obstructive lung disease is result of remote agent orange exposure  Nodule of right lung - stable 25mm right upper lobe x 2 year  - no further followup - could consider LDCT for lung cancer screen in future  Lightheadedness - glad ER workup mid April 2-019 was normal but due to ongoing lightheadedness - refer cards as recommended by Dr [place] Bells    Followup 6 months or sooner if needed

## 2017-08-21 NOTE — Progress Notes (Signed)
PFT before and DLCO completed 08/21/17  Patient was unable to do more than 1 DLCO due to being light headed and shortness of breath. Patients o2 saturation was at 97%

## 2017-08-21 NOTE — Progress Notes (Signed)
Subjective:     Patient ID: Matthew Patrick, male   DOB: Sep 08, 1945, 72 y.o.   MRN: [id number]  HPI  PROB LIST  - obstructive lung disease nos with low dlco (paint fumes + remote smoking)  - never had subjective improvementt iwht MDI - chronic multifactorial cough  - sinus  - gerd: PPI  0- irritable larynx - started gabapentin early 2015/late 2014 with good rsult, s/P: speech Rx - Lung nodule with hx of remote smoking  -  60mm nodule RUL - no change dec 2014 through Jan 2016 - no fu for this . OSA with restless legs  - sees [place]. STarted cpap 2015 mid  - resltess legs improved with gabapentin for cough  - smoking hx  -  reports that he quit smoking about 47 years ago. His smoking use included Cigarettes. He has a 5 pack-year smoking history. He does not have any smokeless tobacco history on file.    OV 05/22/2014  Chief Complaint  Patient presents with  . Follow-up    Pt stated his breathing is much improved since starting on CPAP. Pt denies cough, SOB and CP/tightness.     FU for all of above issues  1. Obstructive lung disease: Given lack of subjective improvement with maintenance bronchodilators and steroids. He has stopped using this completely. He only uses albuterol as needed. Every few weeks he does have some chest tightness for which she uses albuterol as rescue. He does not feel the need to take any further inhalers on a scheduled basis. Since I last saw him in March 2015 he has had a diagnosis of sleep apnea made and he is on CPAP which she feels better significant impact in his symptoms and he attributes most of the improvement due to diagnosis and management of sleep apnea. Spirometry today shows continued obstruction at moderate severity he did FEV1 2.3 L/65%, FVC 3.3 L/73%, ratio 69 (  Similar to dec 2014). Of note, did have a cold and took him long time to recover per wife  2. Lung nodule 4 mm right upper lobe: 1 you CT scan of the chest done January 2060 shows this is  stable but patient has new  5 mm pulmonary nodule in the inferior aspect of the lingula - Jan  2016    3. Chronic cough: This continues to be in remission with the help of gabapentin and Flonase and acid reflux treatment. He has now been on gabapentin for for at least a year. We discussed stopping this gradually which he is open to but his wife does not want him to stop his gabapentin ago she worries that his restless leg will come back and rt he will start kicking her in  bed  Smoking:  reports that he quit smoking about 47 years ago. His smoking use included Cigarettes. He has a 5 pack-year smoking history. He does not have any smokeless tobacco history on file.    OV 01/19/2015  Chief Complaint  Patient presents with  . Follow-up    Had CT scan done 2 wks. ago.Feeling better with coughing since on CPAP.Doing well with CPAP.Occass Sob when lying down,nasal congestion when lays down.PND,no sorethroat.    Follow-up  # Obstructive lung disease/like a COPD given exposure history: Overall stable. He is on Spiriva daily. He and his wife [name] that ever since this started using CPAP for sleep apnea overall quality of life, dyspnea, energy levels and restless legs all have improved. Today spirometry shows FEV1 70%.  There are no new issues no emergency room visits no exacerbations  #Cough this is no longer an issue but he still takes the gabapentin for his restless legs which is helping. He is taking 300 mg once daily at night for 2 years almost. Wife does not want him to stop this because they feel restless legs will be worse and he will start kicking her.  #Sleep apnea: This is associated with restless legs. He has not seen Dr. Elsworth Soho in over a year. He is taking gabapentin for restless legs. Wonder if he could go down on gabapentin  #Lung nodule  - Personally visualized the CT image. It is below. He is upset that I did not call him with the CT result from a few weeks ago even though today's  visit is to review these results. In the future he wants me to call him about the CT results even before the visit   MPRESSION: 1. Lingular 5 mm pulmonary nodule, stable for 7 months. A final follow-up chest CT is advised in 12 months. This recommendation follows the consensus statement: Guidelines for Management of Small Pulmonary Nodules Detected on CT Scans: A Statement from the [place] as published in Radiology 2005;237:395-400. 2. Five additional scattered pulmonary nodules, largest 5 mm in the left lower lobe, for which 20 month stability has been demonstrated, in keeping with benign nodules for which no further follow-up is required. 3. Stable subsegmental bibasilar lung scarring.   Electronically Signed  By: Ilona Sorrel M.D.  On: 12/31/2014 13:52   OV 07/05/2017  Chief Complaint  Patient presents with  . Follow-up    Last seen 01/19/15 by MR.  Pt states he is having trouble with becoming SOB a lot easier. Denies any cough or CP.     Matthew Patrick presents for follow-up.  I have not seen him in almost 3 years.  This visit is considered to establish routine follow-up visit because technically is still under 3 years.  But given the span of time I have had to work him up almost like a new patient.  I should see him for moderate obstructive lung disease with history of agent orange exposure and limited smoking.  He also had a lingula 5 mm lung nodule.  He has not followed up for any of these issues including his restless leg syndrome which she was seeing Dr. Elsworth Soho for.  It appears in the interim he has had multiple admissions because of major depression and bipolar disease.  This was all through the [facility]  He has now been discharged around Christmas 2000 2:18 month hospitalization for depression.  He undergoes scheduled electroconvulsive therapy because of major depression.  During all this time his inhaler Spiriva has been forgotten he is not taking any  inhalers.  Overall respiratory symptoms are stable although wife states that he gets dyspneic with exertion.  They want to reassess his lung status and see where things are.  Wife wanted to know if there is a possible link between agent orange exposure and obstructive lung disease.  This is because the official report ONLY history of cancers and neuropathies and birth defects as being linked to agent orange.  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  The [facility] is officially classified him as having been exposed to agent orange.    Acute visit 08/03/17 This is a patient of Dr.  Demya Scruggs who comes to my clinic today for evaluation of shortness of breath.  He says that symptoms started yesterday and were of fairly gradual onset.  He has had a steady progression of shortness of breath.  He is short of breath at rest.  He has been using albuterol to no relief.  He is also been using Symbicort which she says did not help either.  No recent sick contacts.  No fevers, no chills, no body aches, no headaches, no diarrhea.  He has had some chest tightness throughout this time.  He noticed some leg swelling recently.  He says he has been coughing, no mucus production.   His wife says that ever since December he has been "huffing and puffing" more.  Specifically she says that he has been taking deep breaths with nearly all activity.  He was hospitalized for several months in late December for major depression.  He has been receiving weekly ECT treatments since then.  He fell a couple days ago and struck his head.  No recent weakness, numbness, change in mental status or speech.  Addendum history at 3:55 PM: The patient is now complaining of lightheadedness and dizziness.  He did not mention this when he came in.  Nor had he explained this to his wife.  However, he now says he has been having it all day today.   OV 08/21/2017  Chief  Complaint  Patient presents with  . Follow-up    PFT done today.  Pt does have complaints of SOB that happens at anytime but is worse with exertion, occ. dry cough, and has some chest tightness.   Fu copd Fu lung nodule Fu agent orange exposure Fu new issue - dizziness/lightheadedness  Matthew Patrick presents with his wife. He continues to suffer from severe depression and is on scheduled electroconvulsive therapy. This visit is follow-up for test results and interim issues. He had pulmonary function test. He tells me that after he restarted himself on Spiriva at last visit he started feeling improved shortness of breath but he still has significant amount of residual shortness of breath. This is not getting better. He says he cannot do pulmonary rehabilitation because of scheduling issues. His pulmonary function test shows continued stability in FEV1 in the last 5 years. His CT chest shows healthy lung parenchyma.Wife again is wondering if the COPD was cost of made worse by history of remote agent orange exposure. I again explained to her that it is quite conceivable that remote exposures can cause delayed onset lung disease. However statistical correlation is required. I'm okay doing a letter that this is theoretically possible..   In terms of his lung nodule: Had repeat chest CT. In the past and thought he had lingular lung nodule. With a repeat chest CT says he has a right upper lobe lung nodule and it is stable. No further follow-up CT chest is recommended.  New issue of dizziness/lightheadedness: He complained about this acutely at pulmonary clinic visit 2 weeks ago. He was sent to the emergency department. I review the results there were all normal including EKG but he still continues to have dizziness. Dr. Lake Bells at that visit recommended that he be seen in cardiology for Holter monitor. Wife and he has not followed up with this.   PFT  Results for Matthew, Patrick (MRN [id number]) as of  08/21/2017 10:21  Ref. Range 04/15/2013 09:43 08/21/2017 08:54  FVC-Pre Latest Units: L 3.56 3.74  FVC-%Pred-Pre Latest Units: % 78 86  FEV1-Pre Latest Units: L 2.15 2.12  FEV1-%Pred-Pre Latest Units: % 64 67  Results for Matthew, Patrick (MRN [id number]) as of 08/21/2017 10:21  Ref. Range 04/15/2013 09:43 08/21/2017 08:54  DLCO unc Latest Units: ml/min/mmHg 19.59 21.56  DLCO unc % pred Latest Units: % 60 66    CT chest 07/13/2017 Lungs/Pleura: 4 mm posterior right upper lobe pulmonary nodule (series 3/image 46), grossly unchanged. 2-3 mm anterior right upper lobe pulmonary nodule (series 3/image 53), unchanged. IMPRESSION: Small right upper lobe pulmonary nodules measuring up to 4 mm, unchanged, benign. Given greater than 2 year stability, dedicated follow-up imaging is not required per Fleischner Society guidelines. This recommendation follows the consensus statement: Guidelines for Management of Small Pulmonary Nodules Detected on CT Images: From the Fleischner Society 2017; Radiology 2017; 284:228-243.  Bilateral lower lobe scarring, chronic. Mild lingular scarring/atelectasis.  Trace left pleural effusion, new.  Aortic Atherosclerosis (ICD10-I70.0) and Emphysema (ICD10-J43.9).   Electronically Signed   By: Julian Hy M.D.   On: 07/13/2017 13:50  Results for Matthew, Patrick (MRN [id number]) as of 08/21/2017 10:21  Ref. Range 08/04/2017 11:04  Creatinine Latest Ref Range: 0.61 - 1.24 mg/dL 0.84  Results for Matthew, Patrick (MRN [id number]) as of 08/21/2017 10:21  Ref. Range 08/04/2017 11:04  Hemoglobin Latest Ref Range: 13.0 - 17.0 g/dL 13.2  Results for Matthew, Patrick (MRN [id number]) as of 08/21/2017 10:21  Ref. Range 08/04/2017 11:16  Troponin i, poc Latest Ref Range: 0.00 - 0.08 ng/mL 0.00     has a past medical history of Anxiety, Asthma, Depression, Hyperlipemia, Hypertension, and Sleep apnea.   reports that he quit smoking about 50 years ago. His smoking use included  cigarettes. He has a 5.00 pack-year smoking history. He has never used smokeless tobacco.  Past Surgical History:  Procedure Laterality Date  . 2D Echocardiogram  03/27/11  . CATARACT EXTRACTION    . Neck skin tag biopsy    . TONSILLECTOMY AND ADENOIDECTOMY      No Known Allergies    Immunization History  Administered Date(s) Administered  . Influenza Split 02/22/2013, 01/22/2014  . Influenza, High Dose Seasonal PF 01/04/2016, 02/02/2017  . Influenza,inj,Quad PF,6+ Mos 01/19/2015  . Pneumococcal Conjugate-13 06/10/2014  . Pneumococcal Polysaccharide-23 04/24/2010  . Tdap 06/06/2013    Family History  Problem Relation Age of Onset  . Depression Brother   . Depression Sister   . Heart disease Father   . Anxiety disorder Father   . Depression Father   . Emphysema Mother   . Allergies Mother      Current Outpatient Medications:  .  albuterol (PROVENTIL HFA;VENTOLIN HFA) 108 (90 Base) MCG/ACT inhaler, Inhale into the lungs., Disp: , Rfl:  .  amLODipine (NORVASC) 5 MG tablet, Take 5 mg by mouth every morning., Disp: , Rfl:  .  aspirin 81 MG tablet, Take 81 mg by mouth daily., Disp: , Rfl:  .  B Complex-C (B-COMPLEX WITH VITAMIN C) tablet, Take by mouth., Disp: , Rfl:  .  fluticasone (FLONASE) 50 MCG/ACT nasal spray, 2 sprays by Both Nostrils route daily as needed., Disp: , Rfl:  .  loratadine (CLARITIN) 10 MG tablet, Take 10 mg by mouth every morning., Disp: , Rfl:  .  LORazepam (ATIVAN) 0.5 MG tablet, Take 0.5 tablets (0.25 mg total) by mouth 4 (four) times daily. (Patient taking differently: Take 0.25 mg by mouth 4 (four) times daily as needed for anxiety. ), Disp: 30 tablet, Rfl: 0 .  Melatonin 10 MG TABS, Take 10  mg by mouth at bedtime. , Disp: , Rfl:  .  Misc Natural Products (PROSTATE HEALTH) CAPS, Take 1 capsule by mouth daily., Disp: , Rfl:  .  Multiple Vitamins-Minerals (EYE HEALTH PO), Take 1 tablet by mouth daily., Disp: , Rfl:  .  Polyethyl Glycol-Propyl Glycol  (SYSTANE) 0.4-0.3 % GEL ophthalmic gel, Place 1 drop into both eyes 2 (two) times daily., Disp: , Rfl:  .  rOPINIRole HCl (REQUIP PO), Take 1 tablet by mouth 3 (three) times daily., Disp: , Rfl:  .  Suvorexant (BELSOMRA) 20 MG TABS, Take 20 mg by mouth at bedtime., Disp: , Rfl:  .  tamsulosin (FLOMAX) 0.4 MG CAPS capsule, Take 0.4 mg by mouth at bedtime., Disp: , Rfl:  .  Tiotropium Bromide Monohydrate (SPIRIVA RESPIMAT) 2.5 MCG/ACT AERS, Inhale 2 puffs into the lungs daily., Disp: , Rfl:  .  traZODone (DESYREL) 50 MG tablet, Take 40 mg by mouth at bedtime., Disp: , Rfl:  .  Tiotropium Bromide Monohydrate (SPIRIVA RESPIMAT) 2.5 MCG/ACT AERS, Inhale 2 puffs into the lungs daily for 1 day., Disp: 1 Inhaler, Rfl: 0    Review of Systems     Objective:   Physical Exam  Constitutional: He is oriented to person, place, and time. He appears well-developed and well-nourished. No distress.  HENT:  Head: Normocephalic and atraumatic.  Right Ear: External ear normal.  Left Ear: External ear normal.  Mouth/Throat: Oropharynx is clear and moist. No oropharyngeal exudate.  Eyes: Pupils are equal, round, and reactive to light. Conjunctivae and EOM are normal. Right eye exhibits no discharge. Left eye exhibits no discharge. No scleral icterus.  Neck: Normal range of motion. Neck supple. No JVD present. No tracheal deviation present. No thyromegaly present.  Cardiovascular: Normal rate, regular rhythm and intact distal pulses. Exam reveals no gallop and no friction rub.  No murmur heard. Pulmonary/Chest: Effort normal and breath sounds normal. No respiratory distress. He has no wheezes. He has no rales. He exhibits no tenderness.  Abdominal: Soft. Bowel sounds are normal. He exhibits no distension and no mass. There is no tenderness. There is no rebound and no guarding.  Musculoskeletal: Normal range of motion. He exhibits no edema or tenderness.  Lymphadenopathy:    He has no cervical adenopathy.   Neurological: He is alert and oriented to person, place, and time. He has normal reflexes. No cranial nerve deficit. Coordination normal.  Skin: Skin is warm and dry. No rash noted. He is not diaphoretic. No erythema. No pallor.  Psychiatric: Thought content normal.  Depressed flat affect  Nursing note and vitals reviewed.  Vitals:   08/21/17 1011  BP: 124/72  Pulse: 60  SpO2: 99%  Weight: 158 lb 9.6 oz (71.9 kg)  Height: 5\' 10"  (1.778 m)    Estimated body mass index is 22.76 kg/m as calculated from the following:   Height as of this encounter: 5\' 10"  (1.778 m).   Weight as of this encounter: 158 lb 9.6 oz (71.9 kg).        Assessment:       ICD-10-CM   1. COPD, moderate ([place]) J44.9   2. H/O agent Orange exposure Z77.098   3. Nodule of left lung R91.1   4. Lightheadedness R42        Plan:     COPD, moderate ([place])  - copd stable with PFT stability 2014 -> 2019 - but there is ongoing excess shortness of breath despite spiriva - So, add breo (symbicort not working for  you subjectively) daily - Use albuterol as needed - cards referral to rule out cardiac cause of dyspnea - try to get into pulm rehab when possible   H/O agent Orange exposure - possible that obstructive lung disease is result of remote agent orange exposure  Nodule of right lung - stable 47mm right upper lobe x 2 year  - no further followup - could consider LDCT for lung cancer screen in future  Lightheadedness - glad ER workup mid April 2-019 was normal but due to ongoing lightheadedness - refer cards as recommended by Dr Lake Bells    Followup 6 months or sooner if  Needed   Dr. Brand Males, M.D., [facility].C.P Pulmonary and Critical Care Medicine Staff Physician, [place] Director - Interstitial Lung Disease  Program  Pulmonary [place] [facility] [place], [place], [address]  Pager: [phone], If no answer or between  15:00h -  7:00h: call [phone] Telephone: [phone]

## 2017-08-24 ENCOUNTER — Telehealth: Payer: Self-pay | Admitting: Internal Medicine

## 2017-08-24 NOTE — Telephone Encounter (Signed)
Attempted to fax OV notes to number provided but this is a nonworking number per automated message.   Spoke to pt's wife who will obtain correct fax # and will call back with this.  Will await call back.

## 2017-08-24 NOTE — Telephone Encounter (Signed)
Pt wife is returning call. Per wife, the fax number is correct however will not connect if there is more than one fax coming through. Fax number [phone] Attn Dr. Felton Clinton or Helene Kelp

## 2017-08-24 NOTE — Telephone Encounter (Signed)
Spoke with pt's spouse and advised that I would try faxing again.  Pt's wife expressed understanding.  Nothing further needed at this time.

## 2017-08-30 ENCOUNTER — Ambulatory Visit (HOSPITAL_COMMUNITY)
Admission: RE | Admit: 2017-08-30 | Discharge: 2017-08-30 | Disposition: A | Discharge status: Home / Self Care | Payer: Medicare Other | Source: Ambulatory Visit | Attending: Specialist | Admitting: Specialist

## 2017-08-30 DIAGNOSIS — R413 Other amnesia: Secondary | ICD-10-CM | POA: Diagnosis not present

## 2017-08-30 DIAGNOSIS — G20 Parkinson's disease: Secondary | ICD-10-CM | POA: Diagnosis not present

## 2017-08-30 MED ORDER — IOFLUPANE I 123 185 MBQ/2.5ML IV SOLN
4.6000 | Freq: Once | INTRAVENOUS | Status: AC
  Administered 2017-08-30: 4.6 via INTRAVENOUS

## 2017-09-05 ENCOUNTER — Encounter: Payer: Self-pay | Admitting: Cardiovascular Disease

## 2017-09-05 ENCOUNTER — Ambulatory Visit (INDEPENDENT_AMBULATORY_CARE_PROVIDER_SITE_OTHER): Payer: Medicare Other | Admitting: Cardiovascular Disease

## 2017-09-05 VITALS — BP 132/70 | HR 73 | Ht 70.0 in | Wt 160.4 lb

## 2017-09-05 DIAGNOSIS — R072 Precordial pain: Secondary | ICD-10-CM | POA: Diagnosis not present

## 2017-09-05 DIAGNOSIS — Q991 46, XX true hermaphrodite: Secondary | ICD-10-CM

## 2017-09-05 DIAGNOSIS — R0602 Shortness of breath: Secondary | ICD-10-CM

## 2017-09-05 DIAGNOSIS — R0789 Other chest pain: Secondary | ICD-10-CM | POA: Insufficient documentation

## 2017-09-05 NOTE — Assessment & Plan Note (Signed)
History of essential hypertension with blood pressure measured today at 132/70.  He is on amlodipine.  Continue current meds.

## 2017-09-05 NOTE — Assessment & Plan Note (Signed)
History of chronic chest pressure family history I am going to get an exercise Myoview to further evaluate.

## 2017-09-05 NOTE — Assessment & Plan Note (Signed)
History of chronic shortness of breath thought to be related to reactive airways disease COPD.  There is a question of pericardial thickening on prior chest CTs.  I am going to get a 2D echocardiogram and a cardiac MRI to further evaluate.

## 2017-09-05 NOTE — Patient Instructions (Signed)
Medication Instructions: Your physician recommends that you continue on your current medications as directed. Please refer to the Current Medication list given to you today.   Testing/Procedures: Your physician has requested that you have an echocardiogram. Echocardiography is a painless test that uses sound waves to create images of your heart. It provides your doctor with information about the size and shape of your heart and how well your heart's chambers and valves are working. This procedure takes approximately one hour. There are no restrictions for this procedure.  Your physician has requested that you have an exercise stress myoview. For further information please visit [email or web address]. Please follow instruction sheet, as given.  Your physician has requested that you have a cardiac MRI. Cardiac MRI uses a computer to create images of your heart as its beating, producing both still and moving pictures of your heart and major blood vessels. For further information please visit [email or web address] Please follow the instruction sheet given to you today for more information.  Follow-Up: Your physician recommends that you schedule a follow-up appointment after testing with Dr. Gwenlyn Found.  If you need a refill on your cardiac medications before your next appointment, please call your pharmacy.

## 2017-09-05 NOTE — Progress Notes (Signed)
09/05/2017 Matthew Patrick   05-Dec-1945  [id number]  Primary Physician Bernell List, MD Primary Cardiologist: Lorretta Harp MD Matthew Patrick, [place]  HPI:  Matthew Patrick is a 72 y.o. 72 year old male father of 12, grandfather and 3 grandchildren referred by Dr. Felton Clinton the [facility] for evaluation of chronic chest pressure and shortness of breath.  His risk factors include treated hypertension.  His father did die of a myocardial infarction at age 76.  Does have obstructive sleep apnea and severe depression any weekly ECT.  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  He is never had a heart attack or stroke.  Does have chronic shortness of breath thought to be related to obstructive sleep apnea, COPD and/or reactive airways disease and chronic chest pressure.  Apparently he has had CT scans of his chest that showed pericardial thickening.   Current Meds  Medication Sig  . albuterol (PROVENTIL HFA;VENTOLIN HFA) 108 (90 Base) MCG/ACT inhaler Inhale into the lungs.  Marland Kitchen amLODipine (NORVASC) 5 MG tablet Take 5 mg by mouth every morning.  Marland Kitchen aspirin 81 MG tablet Take 81 mg by mouth daily.  . B Complex-C (B-COMPLEX WITH VITAMIN C) tablet Take by mouth.  . fluticasone (FLONASE) 50 MCG/ACT nasal spray 2 sprays by Both Nostrils route daily as needed.  . fluticasone furoate-vilanterol (BREO ELLIPTA) 100-25 MCG/INH AEPB Inhale 1 puff into the lungs daily.  Marland Kitchen loratadine (CLARITIN) 10 MG tablet Take 10 mg by mouth every morning.  Marland Kitchen LORazepam (ATIVAN) 0.5 MG tablet Take 0.5 tablets (0.25 mg total) by mouth 4 (four) times daily. (Patient taking differently: Take 0.25 mg by mouth 4 (four) times daily as needed for anxiety. )  . Melatonin 10 MG TABS Take 10 mg by mouth at bedtime.   . Misc Natural Products (PROSTATE HEALTH) CAPS Take 1 capsule by mouth daily.  . Multiple Vitamins-Minerals (EYE HEALTH PO) Take 1 tablet by mouth daily.  Matthew Patrick Glycol-Propyl Glycol (SYSTANE)  0.4-0.3 % GEL ophthalmic gel Place 1 drop into both eyes 2 (two) times daily.  Marland Kitchen rOPINIRole HCl (REQUIP PO) Take 1 tablet by mouth 3 (three) times daily.  . Suvorexant (BELSOMRA) 20 MG TABS Take 20 mg by mouth at bedtime.  . tamsulosin (FLOMAX) 0.4 MG CAPS capsule Take 0.4 mg by mouth at bedtime.  . Tiotropium Bromide Monohydrate (SPIRIVA RESPIMAT) 2.5 MCG/ACT AERS Inhale 2 puffs into the lungs daily.  . traZODone (DESYREL) 50 MG tablet Take 40 mg by mouth at bedtime.     No Known Allergies  Social History   Socioeconomic History  . Marital status: Married    Spouse name: Not on file  . Number of children: Not on file  . Years of education: Not on file  . Highest education level: Not on file  Occupational History  . Occupation: retired  Scientific laboratory technician  . Financial resource strain: Not on file  . Food insecurity:    Worry: Not on file    Inability: Not on file  . Transportation needs:    Medical: Not on file    Non-medical: Not on file  Tobacco Use  . Smoking status: Former Smoker    Packs/day: 1.00    Years: 5.00    Pack years: 5.00    Types: Cigarettes    Last attempt to quit: 04/25/1967    Years since quitting: 50.4  . Smokeless tobacco: Never Used  Substance and Sexual Activity  . Alcohol use: No  .  Drug use: No  . Sexual activity: Never  Lifestyle  . Physical activity:    Days per week: Not on file    Minutes per session: Not on file  . Stress: Not on file  Relationships  . Social connections:    Talks on phone: Not on file    Gets together: Not on file    Attends religious service: Not on file    Active member of club or organization: Not on file    Attends meetings of clubs or organizations: Not on file    Relationship status: Not on file  . Intimate partner violence:    Fear of current or ex partner: Not on file    Emotionally abused: Not on file    Physically abused: Not on file    Forced sexual activity: Not on file  Other Topics Concern  . Not on file   Social History Narrative  . Not on file     Review of Systems: General: negative for chills, fever, night sweats or weight changes.  Cardiovascular: negative for chest pain, dyspnea on exertion, edema, orthopnea, palpitations, paroxysmal nocturnal dyspnea or shortness of breath Dermatological: negative for rash Respiratory: negative for cough or wheezing Urologic: negative for hematuria Abdominal: negative for nausea, vomiting, diarrhea, bright red blood per rectum, melena, or hematemesis Neurologic: negative for visual changes, syncope, or dizziness All other systems reviewed and are otherwise negative except as noted above.    Blood pressure 132/70, pulse 73, height 5\' 10"  (1.778 m), weight 160 lb 6.4 oz (72.8 kg), SpO2 97 %.  General appearance: alert and no distress Neck: no adenopathy, no carotid bruit, no JVD, supple, symmetrical, trachea midline and thyroid not enlarged, symmetric, no tenderness/mass/nodules Lungs: clear to auscultation bilaterally Heart: regular rate and rhythm, S1, S2 normal, no murmur, click, rub or gallop Extremities: extremities normal, atraumatic, no cyanosis or edema Pulses: 2+ and symmetric Skin: Skin color, texture, turgor normal. No rashes or lesions Neurologic: Alert and oriented X 3, normal strength and tone. Normal symmetric reflexes. Normal coordination and gait  EKG not performed today  ASSESSMENT AND PLAN:   Hypertension History of essential hypertension with blood pressure measured today at 132/70.  He is on amlodipine.  Continue current meds.  Atypical chest pain History of chronic chest pressure family history I am going to get an exercise Myoview to further evaluate.  Shortness of breath History of chronic shortness of breath thought to be related to reactive airways disease COPD.  There is a question of pericardial thickening on prior chest CTs.  I am going to get a 2D echocardiogram and a cardiac MRI to further  evaluate.      Lorretta Harp MD FACP,FACC,FAHA, [facility] 09/05/2017 2:27 PM

## 2017-09-10 ENCOUNTER — Ambulatory Visit (HOSPITAL_COMMUNITY): Payer: Medicare Other | Attending: Cardiology

## 2017-09-10 ENCOUNTER — Other Ambulatory Visit: Payer: Self-pay

## 2017-09-10 DIAGNOSIS — Z87891 Personal history of nicotine dependence: Secondary | ICD-10-CM | POA: Insufficient documentation

## 2017-09-10 DIAGNOSIS — I10 Essential (primary) hypertension: Secondary | ICD-10-CM | POA: Diagnosis not present

## 2017-09-10 DIAGNOSIS — D649 Anemia, unspecified: Secondary | ICD-10-CM | POA: Diagnosis not present

## 2017-09-10 DIAGNOSIS — R072 Precordial pain: Secondary | ICD-10-CM | POA: Insufficient documentation

## 2017-09-10 DIAGNOSIS — G4733 Obstructive sleep apnea (adult) (pediatric): Secondary | ICD-10-CM | POA: Insufficient documentation

## 2017-09-10 DIAGNOSIS — R55 Syncope and collapse: Secondary | ICD-10-CM | POA: Insufficient documentation

## 2017-09-10 DIAGNOSIS — R0602 Shortness of breath: Secondary | ICD-10-CM | POA: Insufficient documentation

## 2017-09-13 ENCOUNTER — Telehealth (HOSPITAL_COMMUNITY): Payer: Self-pay

## 2017-09-13 ENCOUNTER — Encounter: Payer: Self-pay | Admitting: Cardiovascular Disease

## 2017-09-13 NOTE — Telephone Encounter (Signed)
Encounter complete. 

## 2017-09-14 ENCOUNTER — Telehealth (HOSPITAL_COMMUNITY): Payer: Self-pay

## 2017-09-14 NOTE — Telephone Encounter (Signed)
Encounter complete. 

## 2017-09-18 ENCOUNTER — Ambulatory Visit (HOSPITAL_COMMUNITY)
Admission: RE | Admit: 2017-09-18 | Discharge: 2017-09-18 | Disposition: A | Discharge status: Home / Self Care | Payer: Medicare Other | Source: Ambulatory Visit | Attending: Cardiovascular Disease | Admitting: Cardiovascular Disease

## 2017-09-18 DIAGNOSIS — F429 Obsessive-compulsive disorder, unspecified: Secondary | ICD-10-CM | POA: Insufficient documentation

## 2017-09-18 DIAGNOSIS — R55 Syncope and collapse: Secondary | ICD-10-CM | POA: Diagnosis not present

## 2017-09-18 DIAGNOSIS — R911 Solitary pulmonary nodule: Secondary | ICD-10-CM | POA: Insufficient documentation

## 2017-09-18 DIAGNOSIS — F329 Major depressive disorder, single episode, unspecified: Secondary | ICD-10-CM | POA: Diagnosis not present

## 2017-09-18 DIAGNOSIS — R45851 Suicidal ideations: Secondary | ICD-10-CM | POA: Diagnosis not present

## 2017-09-18 DIAGNOSIS — R0602 Shortness of breath: Secondary | ICD-10-CM | POA: Diagnosis not present

## 2017-09-18 DIAGNOSIS — Z8249 Family history of ischemic heart disease and other diseases of the circulatory system: Secondary | ICD-10-CM | POA: Insufficient documentation

## 2017-09-18 DIAGNOSIS — E876 Hypokalemia: Secondary | ICD-10-CM | POA: Insufficient documentation

## 2017-09-18 DIAGNOSIS — G4733 Obstructive sleep apnea (adult) (pediatric): Secondary | ICD-10-CM | POA: Insufficient documentation

## 2017-09-18 DIAGNOSIS — J449 Chronic obstructive pulmonary disease, unspecified: Secondary | ICD-10-CM | POA: Insufficient documentation

## 2017-09-18 DIAGNOSIS — R072 Precordial pain: Secondary | ICD-10-CM | POA: Diagnosis not present

## 2017-09-18 DIAGNOSIS — Z87891 Personal history of nicotine dependence: Secondary | ICD-10-CM | POA: Insufficient documentation

## 2017-09-18 LAB — MYOCARDIAL PERFUSION IMAGING
CHL CUP MPHR: 148 {beats}/min
CHL CUP RESTING HR STRESS: 62 {beats}/min
CHL RATE OF PERCEIVED EXERTION: 18
CSEPED: 4 min
CSEPEDS: 45 s
Estimated workload: 6.7 METS
LV dias vol: 120 mL (ref 62–150)
LV sys vol: 50 mL
NUC STRESS TID: 1.05
Peak HR: 151 {beats}/min
Percent HR: 102 %
SDS: 2
SRS: 0
SSS: 2

## 2017-09-18 MED ORDER — TECHNETIUM TC 99M TETROFOSMIN IV KIT
31.9000 | PACK | Freq: Once | INTRAVENOUS | Status: AC | PRN
  Administered 2017-09-18: 31.9 via INTRAVENOUS
  Filled 2017-09-18: qty 32

## 2017-09-18 MED ORDER — REGADENOSON 0.4 MG/5ML IV SOLN: 0.4000 mg | Freq: Once | INTRAVENOUS | Status: DC

## 2017-09-18 MED ORDER — TECHNETIUM TC 99M TETROFOSMIN IV KIT
10.6000 | PACK | Freq: Once | INTRAVENOUS | Status: AC | PRN
  Administered 2017-09-18: 10.6 via INTRAVENOUS
  Filled 2017-09-18: qty 11

## 2017-09-19 ENCOUNTER — Ambulatory Visit (HOSPITAL_COMMUNITY)
Admission: RE | Admit: 2017-09-19 | Discharge: 2017-09-19 | Disposition: A | Discharge status: Home / Self Care | Payer: Medicare Other | Source: Ambulatory Visit | Attending: Cardiovascular Disease | Admitting: Cardiovascular Disease

## 2017-09-19 DIAGNOSIS — Q991 46, XX true hermaphrodite: Secondary | ICD-10-CM

## 2017-09-19 DIAGNOSIS — I319 Disease of pericardium, unspecified: Secondary | ICD-10-CM | POA: Diagnosis not present

## 2017-09-19 DIAGNOSIS — I313 Pericardial effusion (noninflammatory): Secondary | ICD-10-CM | POA: Diagnosis not present

## 2017-09-19 DIAGNOSIS — R072 Precordial pain: Secondary | ICD-10-CM | POA: Diagnosis not present

## 2017-09-19 LAB — CREATININE, SERUM
Creatinine, Ser: 0.86 mg/dL (ref 0.61–1.24)
GFR calc non Af Amer: >60 mL/min (ref >60)

## 2017-09-19 MED ORDER — GADOBENATE DIMEGLUMINE 529 MG/ML IV SOLN
25.0000 mL | Freq: Once | INTRAVENOUS | Status: AC
  Administered 2017-09-19: 25 mL via INTRAVENOUS

## 2017-09-25 ENCOUNTER — Ambulatory Visit (INDEPENDENT_AMBULATORY_CARE_PROVIDER_SITE_OTHER): Payer: Medicare Other | Admitting: Cardiovascular Disease

## 2017-09-25 ENCOUNTER — Encounter: Payer: Self-pay | Admitting: Cardiovascular Disease

## 2017-09-25 DIAGNOSIS — R0602 Shortness of breath: Secondary | ICD-10-CM

## 2017-09-25 NOTE — Assessment & Plan Note (Signed)
Matthew Patrick returns for follow-up of his outpatient diagnostic studies.  2D echo was normal except for mild diastolic dysfunction.  A Myoview stress test showed no evidence of ischemia cardiac MRI showed normal right left heart size and function with no evidence of pericardial thickening.  I believe his shortness of breath is permanent merrily per pulmonary I suggested that he follow-up with Dr. Gwenette Greet for further evaluation and treatment.

## 2017-09-25 NOTE — Progress Notes (Signed)
Matthew Patrick returns for follow-up of his outpatient diagnostic studies.  2D echo was normal except for mild diastolic dysfunction.  A Myoview stress test showed no evidence of ischemia cardiac MRI showed normal right left heart size and function with no evidence of pericardial thickening.  I believe his shortness of breath is permanent merrily per pulmonary I suggested that he follow-up with Dr. Gwenette Greet for further evaluation and treatment.  Lorretta Harp, M.D., [place], [facility], [place] [address] [place], [place]  [address]  [phone] 09/25/2017 4:30 PM

## 2017-09-25 NOTE — Patient Instructions (Signed)
Your physician recommends that you schedule a follow-up appointment as needed with Dr. Berry   

## 2017-10-05 DIAGNOSIS — F332 Major depressive disorder, recurrent severe without psychotic features: Secondary | ICD-10-CM | POA: Diagnosis not present

## 2017-10-15 ENCOUNTER — Telehealth (HOSPITAL_COMMUNITY): Payer: Self-pay

## 2017-10-15 NOTE — Telephone Encounter (Signed)
Contact pt to see is he was interested in the Pulmonary Rehab Program. Pt stated he was heading into a meeting and would like [place] to give him a call back later.  Will follow up with pt.

## 2017-10-23 ENCOUNTER — Other Ambulatory Visit (HOSPITAL_BASED_OUTPATIENT_CLINIC_OR_DEPARTMENT_OTHER): Payer: Self-pay

## 2017-10-23 ENCOUNTER — Telehealth (HOSPITAL_COMMUNITY): Payer: Self-pay

## 2017-10-23 DIAGNOSIS — G4700 Insomnia, unspecified: Secondary | ICD-10-CM

## 2017-10-23 DIAGNOSIS — G4733 Obstructive sleep apnea (adult) (pediatric): Secondary | ICD-10-CM

## 2017-10-23 NOTE — Telephone Encounter (Signed)
Patient wife Matthew Patrick called to schedule patient for Pulmonary Rehab. Patient will come in for orientation on 11/19/17 @ 1:30PM and will attend the 1:30PM exercise class.  Mailed homework package.

## 2017-11-16 ENCOUNTER — Ambulatory Visit (HOSPITAL_BASED_OUTPATIENT_CLINIC_OR_DEPARTMENT_OTHER): Payer: Medicare Other | Attending: Pulmonary Disease | Admitting: Internal Medicine

## 2017-11-16 DIAGNOSIS — G4733 Obstructive sleep apnea (adult) (pediatric): Secondary | ICD-10-CM

## 2017-11-16 DIAGNOSIS — G4700 Insomnia, unspecified: Secondary | ICD-10-CM | POA: Diagnosis not present

## 2017-11-19 ENCOUNTER — Encounter (HOSPITAL_COMMUNITY): Payer: Self-pay

## 2017-11-19 ENCOUNTER — Encounter (HOSPITAL_COMMUNITY)
Admission: RE | Admit: 2017-11-19 | Discharge: 2017-11-19 | Disposition: A | Discharge status: Home / Self Care | Payer: No Typology Code available for payment source | Source: Ambulatory Visit | Attending: Pulmonary Disease | Admitting: Pulmonary Disease

## 2017-11-19 VITALS — BP 131/58 | HR 67 | Temp 98.3°F | Resp 20 | Ht 68.5 in | Wt 153.4 lb

## 2017-11-19 DIAGNOSIS — J45909 Unspecified asthma, uncomplicated: Secondary | ICD-10-CM | POA: Insufficient documentation

## 2017-11-19 DIAGNOSIS — Z87891 Personal history of nicotine dependence: Secondary | ICD-10-CM | POA: Diagnosis not present

## 2017-11-19 DIAGNOSIS — Z7951 Long term (current) use of inhaled steroids: Secondary | ICD-10-CM | POA: Diagnosis not present

## 2017-11-19 DIAGNOSIS — Z7952 Long term (current) use of systemic steroids: Secondary | ICD-10-CM | POA: Diagnosis not present

## 2017-11-19 DIAGNOSIS — Z79899 Other long term (current) drug therapy: Secondary | ICD-10-CM | POA: Diagnosis not present

## 2017-11-19 DIAGNOSIS — I10 Essential (primary) hypertension: Secondary | ICD-10-CM | POA: Diagnosis not present

## 2017-11-19 DIAGNOSIS — Z7901 Long term (current) use of anticoagulants: Secondary | ICD-10-CM | POA: Diagnosis not present

## 2017-11-19 DIAGNOSIS — Z7982 Long term (current) use of aspirin: Secondary | ICD-10-CM | POA: Diagnosis not present

## 2017-11-19 DIAGNOSIS — F329 Major depressive disorder, single episode, unspecified: Secondary | ICD-10-CM | POA: Insufficient documentation

## 2017-11-19 DIAGNOSIS — J449 Chronic obstructive pulmonary disease, unspecified: Secondary | ICD-10-CM | POA: Insufficient documentation

## 2017-11-19 NOTE — Progress Notes (Signed)
Matthew Patrick 72 y.o. male Pulmonary Rehab Orientation Note [place] a veteran who has [place] authorization - [id number] arrived today in Cardiac and Pulmonary Rehab for orientation to Pulmonary Rehab. Pt ambulated in from the [address] parking deck.  Pt is accompanied by his wife Matthew Patrick. {He/she He does not carry portable oxygen. Per pt, he uses oxygen never, continuously. Color good, skin warm and dry. Patient is oriented to time and place. However, pt has slow response to questions answered.  Pt admits to some long term memory loss.  Pt is noted to have tremors and involuntary movement that he is unaware he is doing.  Pt is under the care of a Psychiatrist and receives shock therapy for severe depression.  Pt last treatment was this morning.  Pt often repeats himself and becomes fidgety and anxious about participating in pulmonary rehab.  Pt stated several times that he was unsure he would "like to proceed".  Pt wife who is very supportive of him offered constant encouragement.  I asked pt to at least try it and if it is not a good fit for him, he is welcomed to stop.  Pt is also being evaluated for parkinson disease.  Patient's medical history, psychosocial health, and medications reviewed. Psychosocial assessment reveals pt lives with their spouse. Pt is currently retired. Pt hobbies include nothing.  Pt doesn't engage in any activities due to his depression. Pt reports his stress level is high. Areas of stress/anxiety include Health Family Finances stressors that he feels although they are not valid or endorsed by anyone.  Pt exhibits signs of depression. Signs of depression include anxiety, guilt, hopelessness and sadness and difficulty maintaining sleep. PHQ2/9 score 6/21 Pt shows poor coping skills with negative outlook . Lathon offered emotional support and reassurance. Will continue to monitor and evaluate progress toward psychosocial goal(s) of development of coping skills to deal with pt stressors. Physical  assessment reveals heart rate is normal, breath sounds clear to auscultation, no wheezes, rales, or rhonchi. Grip strength equal, strong. Distal pulses Palpable with no swelling.  Patient reports he does take medications as prescribed. Patient states he follows a Regular diet. The patient reports no specific efforts to gain or lose weight.. Patient's weight will be monitored closely. Demonstration and practice of PLB using pulse oximeter. Patient able to return demonstration satisfactorily. Safety and hand hygiene in the exercise area reviewed with patient. Patient voices understanding of the information reviewed. Department expectations discussed with patient and achievable goals were set. The patient shows enthusiasm about attending the program and we look forward to working with this nice pt. The patient is scheduled for a 6 min walk test on 7/30 and to begin exercise on 8/6 at 1:30. 45 minutes was spent on a variety of activities such as assessment of the patient, obtaining baseline data including height, weight, BMI, and grip strength, verifying medical history, allergies, and current medications, and teaching patient strategies for performing tasks with less respiratory effort with emphasis on pursed lip breathing. [place], BSN Cardiac and Training and development officer

## 2017-11-20 ENCOUNTER — Encounter (HOSPITAL_COMMUNITY)
Admission: RE | Admit: 2017-11-20 | Discharge: 2017-11-20 | Disposition: A | Discharge status: Home / Self Care | Payer: No Typology Code available for payment source | Source: Ambulatory Visit | Attending: Pulmonary Disease | Admitting: Pulmonary Disease

## 2017-11-20 DIAGNOSIS — J449 Chronic obstructive pulmonary disease, unspecified: Secondary | ICD-10-CM

## 2017-11-22 DIAGNOSIS — R2689 Other abnormalities of gait and mobility: Secondary | ICD-10-CM | POA: Diagnosis not present

## 2017-11-22 DIAGNOSIS — G2401 Drug induced subacute dyskinesia: Secondary | ICD-10-CM | POA: Diagnosis not present

## 2017-11-22 DIAGNOSIS — G4709 Other insomnia: Secondary | ICD-10-CM | POA: Diagnosis not present

## 2017-11-22 NOTE — Progress Notes (Signed)
Pulmonary Individual Treatment Plan  Patient Details  Name: Matthew Patrick MRN: [id number] Date of Birth: May 26, 1945 Referring Provider:     Pulmonary Rehab Walk Test from 11/20/2017 in [place]  Referring Provider  Dr. Nelda Marseille      Initial Encounter Date:    Pulmonary Rehab Walk Test from 11/20/2017 in [place]  Date  11/20/17      Visit Diagnosis: COPD with asthma ([place])  Patient's Home Medications on Admission:   Current Outpatient Medications:  .  albuterol (PROVENTIL HFA;VENTOLIN HFA) 108 (90 Base) MCG/ACT inhaler, Inhale into the lungs., Disp: , Rfl:  .  amLODipine (NORVASC) 5 MG tablet, Take 5 mg by mouth every morning., Disp: , Rfl:  .  aspirin 81 MG tablet, Take 81 mg by mouth daily., Disp: , Rfl:  .  B Complex-C (B-COMPLEX WITH VITAMIN C) tablet, Take by mouth., Disp: , Rfl:  .  escitalopram (LEXAPRO) 5 MG tablet, Take 2.5 mg by mouth daily., Disp: , Rfl:  .  fluticasone (FLONASE) 50 MCG/ACT nasal spray, 2 sprays by Both Nostrils route daily as needed., Disp: , Rfl:  .  fluticasone furoate-vilanterol (BREO ELLIPTA) 100-25 MCG/INH AEPB, Inhale 1 puff into the lungs daily. (Patient not taking: Reported on 11/19/2017), Disp: 1 each, Rfl: 5 .  loratadine (CLARITIN) 10 MG tablet, Take 10 mg by mouth every morning., Disp: , Rfl:  .  LORazepam (ATIVAN) 0.5 MG tablet, Take 0.5 tablets (0.25 mg total) by mouth 4 (four) times daily. (Patient taking differently: Take 0.25 mg by mouth 4 (four) times daily as needed for anxiety. ), Disp: 30 tablet, Rfl: 0 .  Melatonin 10 MG TABS, Take 10 mg by mouth at bedtime. , Disp: , Rfl:  .  Misc Natural Products (PROSTATE HEALTH) CAPS, Take 1 capsule by mouth daily., Disp: , Rfl:  .  Multiple Vitamins-Minerals (EYE HEALTH PO), Take 1 tablet by mouth daily., Disp: , Rfl:  .  Polyethyl Glycol-Propyl Glycol (SYSTANE) 0.4-0.3 % GEL ophthalmic gel, Place 1 drop into both eyes 2 (two) times  daily., Disp: , Rfl:  .  rOPINIRole HCl (REQUIP PO), Take 1 tablet by mouth 3 (three) times daily. 7/29 -For the third dose pt takes two tablets, Disp: , Rfl:  .  Suvorexant (BELSOMRA) 20 MG TABS, Take 20 mg by mouth at bedtime., Disp: , Rfl:  .  tamsulosin (FLOMAX) 0.4 MG CAPS capsule, Take 0.4 mg by mouth at bedtime. 7/29 Takes two tablets at bedtime., Disp: , Rfl:  .  Tiotropium Bromide Monohydrate (SPIRIVA RESPIMAT) 2.5 MCG/ACT AERS, Inhale 2 puffs into the lungs daily., Disp: , Rfl:  .  traZODone (DESYREL) 50 MG tablet, Take 100 mg by mouth at bedtime. , Disp: , Rfl:   Past Medical History: Past Medical History:  Diagnosis Date  . Anxiety   . Asthma   . Depression   . Hyperlipemia   . Hypertension   . Sleep apnea    has c-pap     Tobacco Use: Social History   Tobacco Use  Smoking Status Former Smoker  . Packs/day: 1.00  . Years: 5.00  . Pack years: 5.00  . Types: Cigarettes  . Last attempt to quit: 04/25/1967  . Years since quitting: 50.6  Smokeless Tobacco Never Used    Labs: Recent Chemical engineer    Labs for ITP Cardiac and Pulmonary Rehab Latest Ref Rng & Units 03/26/2011 03/26/2011 09/17/2015 04/10/2016 01/11/2017   Cholestrol 0 -  200 mg/dL 126 124 173 - -   LDLCALC 0 - 99 mg/dL 46 43 87 - -   HDL >40 mg/dL 44 45 58 - -   Trlycerides <150 mg/dL 178(H) 181(H) 140 - -   Hemoglobin A1c 4.0 - 6.0 % 5.7(H) - 5.7 - -   TCO2 22 - 32 mmol/L - - - 28 27      Capillary Blood Glucose: Lab Results  Component Value Date   GLUCAP 90 01/11/2017     Pulmonary Assessment Scores: Pulmonary Assessment Scores    Row Name 11/22/17 0721         ADL UCSD   ADL Phase  Entry       mMRC Score   mMRC Score  0        Pulmonary Function Assessment:   Exercise Target Goals: Date: 11/20/17  Exercise Program Goal: Individual exercise prescription set using results from initial 6 min walk test and THRR while considering  patient's activity barriers and safety.     Exercise Prescription Goal: Initial exercise prescription builds to 30-45 minutes a day of aerobic activity, 2-3 days per week.  Home exercise guidelines will be given to patient during program as part of exercise prescription that the participant will acknowledge.  Activity Barriers & Risk Stratification: Activity Barriers & Cardiac Risk Stratification - 11/19/17 1415      Activity Barriers & Cardiac Risk Stratification   Activity Barriers  Other (comment)    Comments  tremors and involuntary muscle movements    Cardiac Risk Stratification  Moderate       6 Minute Walk: 6 Minute Walk    Row Name 11/22/17 0719         6 Minute Walk   Phase  Initial     Distance  1250 feet     Walk Time  6 minutes     # of Rest Breaks  0     MPH  2.36     METS  2.84     RPE  11     Perceived Dyspnea   1     Symptoms  No     Resting HR  70 bpm     Resting BP  124/60     Resting Oxygen Saturation   94 %     Exercise Oxygen Saturation  during 6 min walk  93 %     Max Ex. HR  93 bpm     Max Ex. BP  144/60       Interval HR   1 Minute HR  69     2 Minute HR  82     3 Minute HR  93     4 Minute HR  83     5 Minute HR  82     6 Minute HR  79     2 Minute Post HR  70     Interval Heart Rate?  Yes       Interval Oxygen   Interval Oxygen?  Yes     Baseline Oxygen Saturation %  94 %     1 Minute Oxygen Saturation %  93 %     1 Minute Liters of Oxygen  0 L     2 Minute Oxygen Saturation %  94 %     2 Minute Liters of Oxygen  0 L     3 Minute Oxygen Saturation %  96 %     3 Minute Liters  of Oxygen  0 L     4 Minute Oxygen Saturation %  94 %     4 Minute Liters of Oxygen  0 L     5 Minute Oxygen Saturation %  95 %     5 Minute Liters of Oxygen  0 L     6 Minute Oxygen Saturation %  95 %     6 Minute Liters of Oxygen  0 L     2 Minute Post Oxygen Saturation %  95 %     2 Minute Post Liters of Oxygen  0 L        Oxygen Initial Assessment: Oxygen Initial Assessment - 11/22/17  0719      Initial 6 min Walk   Oxygen Used  None      Program Oxygen Prescription   Program Oxygen Prescription  None       Oxygen Re-Evaluation:   Oxygen Discharge (Final Oxygen Re-Evaluation):   Initial Exercise Prescription: Initial Exercise Prescription - 11/22/17 0700      Date of Initial Exercise RX and Referring Provider   Date  11/20/17    Referring Provider  Dr. Nelda Marseille      Bike   Level  0.4    Minutes  17      NuStep   Level  2    SPM  80    Minutes  17    METs  1.5      Track   Laps  10    Minutes  17      Prescription Details   Frequency (times per week)  2    Duration  Progress to 45 minutes of aerobic exercise without signs/symptoms of physical distress      Intensity   THRR 40-80% of Max Heartrate  59-118    Ratings of Perceived Exertion  11-13    Perceived Dyspnea  0-4      Progression   Progression  Continue progressive overload as per policy without signs/symptoms or physical distress.      Resistance Training   Training Prescription  Yes    Weight  blue bands    Reps  10-15       Perform Capillary Blood Glucose checks as needed.  Exercise Prescription Changes:   Exercise Comments:   Exercise Goals and Review: Exercise Goals    Row Name 11/19/17 1416             Exercise Goals   Increase Physical Activity  Yes       Intervention  Provide advice, education, support and counseling about physical activity/exercise needs.;Develop an individualized exercise prescription for aerobic and resistive training based on initial evaluation findings, risk stratification, comorbidities and participant's personal goals.       Expected Outcomes  Short Term: Attend rehab on a regular basis to increase amount of physical activity.;Long Term: Add in home exercise to make exercise part of routine and to increase amount of physical activity.;Long Term: Exercising regularly at least 3-5 days a week.       Increase Strength and Stamina  Yes        Intervention  Provide advice, education, support and counseling about physical activity/exercise needs.;Develop an individualized exercise prescription for aerobic and resistive training based on initial evaluation findings, risk stratification, comorbidities and participant's personal goals.       Expected Outcomes  Short Term: Increase workloads from initial exercise prescription for resistance, speed, and METs.;Short Term: Perform resistance training exercises  routinely during rehab and add in resistance training at home;Long Term: Improve cardiorespiratory fitness, muscular endurance and strength as measured by increased METs and functional capacity (6MWT)       Able to understand and use rate of perceived exertion (RPE) scale  Yes       Intervention  Provide education and explanation on how to use RPE scale       Expected Outcomes  Short Term: Able to use RPE daily in rehab to express subjective intensity level;Long Term:  Able to use RPE to guide intensity level when exercising independently       Able to understand and use Dyspnea scale  Yes       Intervention  Provide education and explanation on how to use Dyspnea scale       Expected Outcomes  Short Term: Able to use Dyspnea scale daily in rehab to express subjective sense of shortness of breath during exertion;Long Term: Able to use Dyspnea scale to guide intensity level when exercising independently       Knowledge and understanding of Target Heart Rate Range (THRR)  Yes       Intervention  Provide education and explanation of THRR including how the numbers were predicted and where they are located for reference       Expected Outcomes  Short Term: Able to state/look up THRR;Long Term: Able to use THRR to govern intensity when exercising independently;Short Term: Able to use daily as guideline for intensity in rehab       Understanding of Exercise Prescription  Yes       Intervention  Provide education, explanation, and written materials on  patient's individual exercise prescription       Expected Outcomes  Short Term: Able to explain program exercise prescription;Long Term: Able to explain home exercise prescription to exercise independently          Exercise Goals Re-Evaluation :   Discharge Exercise Prescription (Final Exercise Prescription Changes):   Nutrition:  Target Goals: Understanding of nutrition guidelines, daily intake of sodium 1500mg , cholesterol 200mg , calories 30% from fat and 7% or less from saturated fats, daily to have 5 or more servings of fruits and vegetables.  Biometrics:    Nutrition Therapy Plan and Nutrition Goals:   Nutrition Assessments:   Nutrition Goals Re-Evaluation:   Nutrition Goals Discharge (Final Nutrition Goals Re-Evaluation):   Psychosocial: Target Goals: Acknowledge presence or absence of significant depression and/or stress, maximize coping skills, provide positive support system. Participant is able to verbalize types and ability to use techniques and skills needed for reducing stress and depression.  Initial Review & Psychosocial Screening: Initial Psych Review & Screening - 11/19/17 1413      Initial Review   Current issues with  Current Depression;History of Depression;Current Anxiety/Panic;Current Stress Concerns;Current Psychotropic Meds;Current Sleep Concerns       Quality of Life Scores:  Scores of 19 and below usually indicate a poorer quality of life in these areas.  A difference of  2-3 points is a clinically meaningful difference.  A difference of 2-3 points in the total score of the Quality of Life Index has been associated with significant improvement in overall quality of life, self-image, physical symptoms, and general health in studies assessing change in quality of life.   PHQ-9: Recent Review Flowsheet Data    Depression screen [facility] 2/9 11/19/2017 11/19/2017   Decreased Interest 3 3   Down, Depressed, Hopeless 3 3   PHQ - 2 Score 6 6  Altered  sleeping 3 3   Tired, decreased energy 3 3   Change in appetite 0 0   Feeling bad or failure about yourself  3 3   Trouble concentrating 3 3   Moving slowly or fidgety/restless 3 3   Suicidal thoughts 0 0   PHQ-9 Score 21 21   Difficult doing work/chores Extremely dIfficult Very difficult     Interpretation of Total Score  Total Score Depression Severity:  1-4 = Minimal depression, 5-9 = Mild depression, 10-14 = Moderate depression, 15-19 = Moderately severe depression, 20-27 = Severe depression   Psychosocial Evaluation and Intervention: Psychosocial Evaluation - 11/19/17 1412      Psychosocial Evaluation & Interventions   Interventions  Stress management education;Relaxation education;Encouraged to exercise with the program and follow exercise prescription    Comments  Pt will report positive coping skills for his stressors    Expected Outcomes  Pt will be able to cope with stressors, continue treatment at [facility] Re-Evaluation:   Psychosocial Discharge (Final Psychosocial Re-Evaluation):   Education: Education Goals: Education classes will be provided on a weekly basis, covering required topics. Participant will state understanding/return demonstration of topics presented.  Learning Barriers/Preferences: Learning Barriers/Preferences - 11/19/17 1413      Learning Barriers/Preferences   Learning Barriers  Sight memory issues    Learning Preferences  Individual Instruction;Pictoral;Verbal Instruction;Group Instruction       Education Topics: Risk Factor Reduction:  -Group instruction that is supported by a PowerPoint presentation. Instructor discusses the definition of a risk factor, different risk factors for pulmonary disease, and how the heart and lungs work together.     Nutrition for Pulmonary Patient:  -Group instruction provided by PowerPoint slides, verbal discussion, and written materials to support subject matter. The instructor gives an  explanation and review of healthy diet recommendations, which includes a discussion on weight management, recommendations for fruit and vegetable consumption, as well as protein, fluid, caffeine, fiber, sodium, sugar, and alcohol. Tips for eating when patients are short of breath are discussed.   Pursed Lip Breathing:  -Group instruction that is supported by demonstration and informational handouts. Instructor discusses the benefits of pursed lip and diaphragmatic breathing and detailed demonstration on how to preform both.     Oxygen Safety:  -Group instruction provided by PowerPoint, verbal discussion, and written material to support subject matter. There is an overview of "What is Oxygen" and "Why do we need it".  Instructor also reviews how to create a safe environment for oxygen use, the importance of using oxygen as prescribed, and the risks of noncompliance. There is a brief discussion on traveling with oxygen and resources the patient may utilize.   Oxygen Equipment:  -Group instruction provided by [facility] Staff utilizing handouts, written materials, and equipment demonstrations.   Signs and Symptoms:  -Group instruction provided by written material and verbal discussion to support subject matter. Warning signs and symptoms of infection, stroke, and heart attack are reviewed and when to call the physician/911 reinforced. Tips for preventing the spread of infection discussed.   Advanced Directives:  -Group instruction provided by verbal instruction and written material to support subject matter. Instructor reviews Advanced Directive laws and proper instruction for filling out document.   Pulmonary Video:  -Group video education that reviews the importance of medication and oxygen compliance, exercise, good nutrition, pulmonary hygiene, and pursed lip and diaphragmatic breathing for the pulmonary patient.   Exercise for the Pulmonary Patient:  -  Group instruction that is  supported by a PowerPoint presentation. Instructor discusses benefits of exercise, core components of exercise, frequency, duration, and intensity of an exercise routine, importance of utilizing pulse oximetry during exercise, safety while exercising, and options of places to exercise outside of rehab.     Pulmonary Medications:  -Verbally interactive group education provided by instructor with focus on inhaled medications and proper administration.   Anatomy and Physiology of the Respiratory System and Intimacy:  -Group instruction provided by PowerPoint, verbal discussion, and written material to support subject matter. Instructor reviews respiratory cycle and anatomical components of the respiratory system and their functions. Instructor also reviews differences in obstructive and restrictive respiratory diseases with examples of each. Intimacy, Sex, and Sexuality differences are reviewed with a discussion on how relationships can change when diagnosed with pulmonary disease. Common sexual concerns are reviewed.   MD DAY -A group question and answer session with a medical doctor that allows participants to ask questions that relate to their pulmonary disease state.   OTHER EDUCATION -Group or individual verbal, written, or video instructions that support the educational goals of the pulmonary rehab program.   Holiday Eating Survival Tips:  -Group instruction provided by PowerPoint slides, verbal discussion, and written materials to support subject matter. The instructor gives patients tips, tricks, and techniques to help them not only survive but enjoy the holidays despite the onslaught of food that accompanies the holidays.   Knowledge Questionnaire Score:   Core Components/Risk Factors/Patient Goals at Admission: Personal Goals and Risk Factors at Admission - 11/19/17 1405      Core Components/Risk Factors/Patient Goals on Admission    Weight Management  Weight Maintenance;Weight  Gain;Yes    Intervention  Weight Management: Develop a combined nutrition and exercise program designed to reach desired caloric intake, while maintaining appropriate intake of nutrient and fiber, sodium and fats, and appropriate energy expenditure required for the weight goal.;Weight Management: Provide education and appropriate resources to help participant work on and attain dietary goals.;Weight Management/Obesity: Establish reasonable short term and long term weight goals.    Admit Weight  152 lb 1.9 oz (69 kg)    Improve shortness of breath with ADL's  Yes    Intervention  Provide education, individualized exercise plan and daily activity instruction to help decrease symptoms of SOB with activities of daily living.    Expected Outcomes  Short Term: Improve cardiorespiratory fitness to achieve a reduction of symptoms when performing ADLs;Long Term: Be able to perform more ADLs without symptoms or delay the onset of symptoms    Stress  Yes    Intervention  Offer individual and/or small group education and counseling on adjustment to heart disease, stress management and health-related lifestyle change. Teach and support self-help strategies.    Expected Outcomes  Short Term: Participant demonstrates changes in health-related behavior, relaxation and other stress management skills, ability to obtain effective social support, and compliance with psychotropic medications if prescribed.;Long Term: Emotional wellbeing is indicated by absence of clinically significant psychosocial distress or social isolation.       Core Components/Risk Factors/Patient Goals Review:    Core Components/Risk Factors/Patient Goals at Discharge (Final Review):    ITP Comments: ITP Comments    Row Name 11/19/17 1354           ITP Comments  Dr. Manfred Arch, Medical Director          Comments:

## 2017-11-25 DIAGNOSIS — G4733 Obstructive sleep apnea (adult) (pediatric): Secondary | ICD-10-CM

## 2017-11-25 NOTE — Procedures (Signed)
   Patient Name: Matthew Patrick, Matthew Patrick Date: 11/16/2017 Gender: Male D.O.B: 02-23-1946 Age (years): 72 Referring Provider: Danton Sewer MD Height (inches): 70 Interpreting Physician: Baird Lyons MD, ABSM Weight (lbs): 150 RPSGT: Earney [place] BMI: 22 MRN: [id number] Neck Size: 15.00  CLINICAL INFORMATION The patient is referred for a CPAP titration to treat sleep apnea.  Date of NPSG, Split Night or HST:  HST 08/26/13   AHI 10.8/ hr, desaturation to 79%, biody weight 79%  SLEEP STUDY TECHNIQUE As per the AASM Manual for the Scoring of Sleep and Associated Events v2.3 (April 2016) with a hypopnea requiring 4% desaturations.  The channels recorded and monitored were frontal, central and occipital EEG, electrooculogram (EOG), submentalis EMG (chin), nasal and oral airflow, thoracic and abdominal wall motion, anterior tibialis EMG, snore microphone, electrocardiogram, and pulse oximetry. Continuous positive airway pressure (CPAP) was initiated at the beginning of the study and titrated to treat sleep-disordered breathing.  MEDICATIONS Medications self-administered by patient taken the night of the study : TRAZADONE, LORAZAPAM, BELSOMRA, TAMULOSIN HCL, STOOL SOFTENER  TECHNICIAN COMMENTS Comments added by technician: NONE Comments added by scorer: N/A  RESPIRATORY PARAMETERS Optimal PAP Pressure (cm): 10 AHI at Optimal Pressure (/hr): 0.0 Overall Minimal O2 (%): 93.0 Supine % at Optimal Pressure (%): 0 Minimal O2 at Optimal Pressure (%): 94.0   SLEEP ARCHITECTURE The study was initiated at 10:05:29 PM and ended at 5:01:17 AM.  Sleep onset time was 11.9 minutes and the sleep efficiency was 76.0%%. The total sleep time was 315.9 minutes.  The patient spent 2.5%% of the night in stage N1 sleep, 77.2%% in stage N2 sleep, 0.0%% in stage N3 and 20.3% in REM.Stage REM latency was 185.5 minutes  Wake after sleep onset was 88.0. Alpha intrusion was absent. Supine sleep was  0.00%.  CARDIAC DATA The 2 lead EKG demonstrated sinus rhythm. The mean heart rate was 56.2 beats per minute. Other EKG findings include: None.  LEG MOVEMENT DATA The total Periodic Limb Movements of Sleep (PLMS) were 51. PLMs with arousal 9. PLM with arousal Index 1.7/ hr..  IMPRESSIONS - The optimal PAP pressure was 10 cm of water. - Central sleep apnea was not noted during this titration (CAI = 0.0/h). - Significant oxygen desaturations were not observed during this titration (min O2 = 93.0%). - The patient snored with soft snoring volume during this titration study. - No cardiac abnormalities were observed during this study. - Mild Periodic Limb Movement.  DIAGNOSIS - Obstructive Sleep Apnea (327.23 [G47.33 ICD-10])  RECOMMENDATIONS - Trial of CPAP therapy on 10 cm H2O or DME autopap 5-15. Patient used a Medium size Resmed Full Face Mask AirFit F10 mask and heated humidification. - Be careful with alcohol, sedatives and other CNS depressants that may worsen sleep apnea and disrupt normal sleep architecture. Note multiple sedating meds taken at this study, listed above. - Sleep hygiene should be reviewed to assess factors that may improve sleep quality. - Weight management and regular exercise should be initiated or continued.  [Electronically signed] 11/25/2017 10:13 AM  Baird Lyons MD, ABSM Diplomate, American Board of Sleep Medicine   NPI: [id number]                          [place]  ELECTRONICALLY SIGNED ON:  11/25/2017, 10:05 AM [place] PH: [phone]   FX: [phone] [place]

## 2017-11-27 ENCOUNTER — Encounter (HOSPITAL_COMMUNITY)
Admission: RE | Admit: 2017-11-27 | Discharge: 2017-11-27 | Disposition: A | Discharge status: Home / Self Care | Payer: No Typology Code available for payment source | Source: Ambulatory Visit | Attending: Pulmonary Disease | Admitting: Pulmonary Disease

## 2017-11-27 VITALS — Wt 153.7 lb

## 2017-11-27 DIAGNOSIS — Z87891 Personal history of nicotine dependence: Secondary | ICD-10-CM | POA: Insufficient documentation

## 2017-11-27 DIAGNOSIS — Z79899 Other long term (current) drug therapy: Secondary | ICD-10-CM | POA: Diagnosis not present

## 2017-11-27 DIAGNOSIS — Z7901 Long term (current) use of anticoagulants: Secondary | ICD-10-CM | POA: Insufficient documentation

## 2017-11-27 DIAGNOSIS — Z7951 Long term (current) use of inhaled steroids: Secondary | ICD-10-CM | POA: Diagnosis not present

## 2017-11-27 DIAGNOSIS — I10 Essential (primary) hypertension: Secondary | ICD-10-CM | POA: Insufficient documentation

## 2017-11-27 DIAGNOSIS — F329 Major depressive disorder, single episode, unspecified: Secondary | ICD-10-CM | POA: Diagnosis not present

## 2017-11-27 DIAGNOSIS — Z7982 Long term (current) use of aspirin: Secondary | ICD-10-CM | POA: Diagnosis not present

## 2017-11-27 DIAGNOSIS — Z7952 Long term (current) use of systemic steroids: Secondary | ICD-10-CM | POA: Diagnosis not present

## 2017-11-27 DIAGNOSIS — J45909 Unspecified asthma, uncomplicated: Secondary | ICD-10-CM | POA: Insufficient documentation

## 2017-11-27 DIAGNOSIS — J449 Chronic obstructive pulmonary disease, unspecified: Secondary | ICD-10-CM | POA: Insufficient documentation

## 2017-11-27 NOTE — Progress Notes (Signed)
Daily Session Note  Patient Details  Name: Matthew Patrick MRN: [id number] Date of Birth: 04/10/1946 Referring Provider:     Pulmonary Rehab Walk Test from 11/20/2017 in [place]  Referring Provider  Dr. Nelda Marseille      Encounter Date: 11/27/2017  Check In: Session Check In - 11/27/17 1619      Check-In   Supervising physician immediately available to respond to emergencies  [facility] immediately available    Physician(s)  Dr.Amin     Location  [facility]    Staff Present  Maurice Small, RN, BSN;Ramon Dredge, RN, MHA;Veleka Djordjevic Ysidro Evert, RN;Molly DiVincenzo, MS, ACSM RCEP, Exercise Physiologist    Medication changes reported      No    Fall or balance concerns reported     No    Tobacco Cessation  No Change    Warm-up and Cool-down  Performed as group-led Higher education careers adviser Performed  Yes    VAD Patient?  No    PAD/SET Patient?  No      Pain Assessment   Currently in Pain?  No/denies       Capillary Blood Glucose: No results found for this or any previous visit (from the past 24 hour(s)).  Exercise Prescription Changes - 11/27/17 1600      Response to Exercise   Blood Pressure (Admit)  120/60    Blood Pressure (Exercise)  142/58    Blood Pressure (Exit)  118/62    Heart Rate (Admit)  69 bpm    Heart Rate (Exercise)  83 bpm    Heart Rate (Exit)  68 bpm    Oxygen Saturation (Admit)  97 %    Oxygen Saturation (Exercise)  95 %    Oxygen Saturation (Exit)  98 %    Rating of Perceived Exertion (Exercise)  13    Perceived Dyspnea (Exercise)  2    Duration  Progress to 45 minutes of aerobic exercise without signs/symptoms of physical distress    Intensity  Other (comment) 40-80 % of HRR      Progression   Progression  Continue to progress workloads to maintain intensity without signs/symptoms of physical distress.      Resistance Training   Training Prescription  Yes    Weight  blue bands    Reps   10-15    Time  10 Minutes      Interval Training   Interval Training  No      Bike   Level  0.6    Minutes  17      NuStep   Level  2    SPM  80    Minutes  17    METs  1.4       Social History   Tobacco Use  Smoking Status Former Smoker  . Packs/day: 1.00  . Years: 5.00  . Pack years: 5.00  . Types: Cigarettes  . Last attempt to quit: 04/25/1967  . Years since quitting: 50.6  Smokeless Tobacco Never Used    Goals Met:  Exercise tolerated well No report of cardiac concerns or symptoms Strength training completed today  Goals Unmet:  Not Applicable  Comments: Service time is from 1345 to 1515    Dr. Rush Farmer is Medical Director for Pulmonary Rehab at [facility].

## 2017-11-29 ENCOUNTER — Encounter (HOSPITAL_COMMUNITY)
Admission: RE | Admit: 2017-11-29 | Discharge: 2017-11-29 | Disposition: A | Discharge status: Home / Self Care | Payer: No Typology Code available for payment source | Source: Ambulatory Visit | Attending: Pulmonary Disease | Admitting: Pulmonary Disease

## 2017-11-29 DIAGNOSIS — J449 Chronic obstructive pulmonary disease, unspecified: Secondary | ICD-10-CM | POA: Diagnosis not present

## 2017-11-29 NOTE — Progress Notes (Signed)
Daily Session Note  Patient Details  Name: Matthew Patrick MRN: [id number] Date of Birth: 07-Feb-1946 Referring Provider:     Pulmonary Rehab Walk Test from 11/20/2017 in [place]  Referring Provider  Dr. Nelda Marseille      Encounter Date: 11/29/2017  Check In: Session Check In - 11/29/17 1546      Check-In   Supervising physician immediately available to respond to emergencies  [facility] immediately available    Physician(s)  Dr. [place] Pink    Location  [facility]    Staff Present  Su Hilt, MS, ACSM RCEP, Exercise Physiologist;Edessa Jakubowicz Colletta [place], RN, MHA    Medication changes reported      No    Fall or balance concerns reported     No    Tobacco Cessation  No Change    Warm-up and Cool-down  Performed as group-led instruction    Resistance Training Performed  Yes    VAD Patient?  No    PAD/SET Patient?  No      Pain Assessment   Currently in Pain?  No/denies    Multiple Pain Sites  No       Capillary Blood Glucose: No results found for this or any previous visit (from the past 24 hour(s)).    Social History   Tobacco Use  Smoking Status Former Smoker  . Packs/day: 1.00  . Years: 5.00  . Pack years: 5.00  . Types: Cigarettes  . Last attempt to quit: 04/25/1967  . Years since quitting: 50.6  Smokeless Tobacco Never Used    Goals Met:  Exercise tolerated well No report of cardiac concerns or symptoms Strength training completed today  Goals Unmet:  Not Applicable  Comments: Service time is from 1330 to 1515    Dr. Rush Farmer is Medical Director for Pulmonary Rehab at [facility]

## 2017-12-04 ENCOUNTER — Encounter (HOSPITAL_COMMUNITY)
Admission: RE | Admit: 2017-12-04 | Discharge: 2017-12-04 | Disposition: A | Discharge status: Home / Self Care | Payer: No Typology Code available for payment source | Source: Ambulatory Visit | Attending: Pulmonary Disease | Admitting: Pulmonary Disease

## 2017-12-04 DIAGNOSIS — J449 Chronic obstructive pulmonary disease, unspecified: Secondary | ICD-10-CM | POA: Diagnosis not present

## 2017-12-04 NOTE — Progress Notes (Signed)
Daily Session Note  Patient Details  Name: Matthew Patrick MRN: [id number] Date of Birth: 07/25/1945 Referring Provider:     Pulmonary Rehab Walk Test from 11/20/2017 in [facility]  Referring Provider  Dr. Yacoub      Encounter Date: 12/04/2017  Check In: Session Check In - 12/04/17 1530      Check-In   Supervising physician immediately available to respond to emergencies  [facility] immediately available    Physician(s)  Dr. Powell    Location  [facility]    Staff Present  Carlette Carlton, RN, BSN;Molly DiVincenzo, MS, ACSM RCEP, Exercise Physiologist; , RN    Medication changes reported      No    Fall or balance concerns reported     No    Tobacco Cessation  No Change    Warm-up and Cool-down  Performed as group-led instruction    Resistance Training Performed  Yes    VAD Patient?  No    PAD/SET Patient?  No      Pain Assessment   Currently in Pain?  No/denies    Multiple Pain Sites  No       Capillary Blood Glucose: No results found for this or any previous visit (from the past 24 hour(s)).    Social History   Tobacco Use  Smoking Status Former Smoker  . Packs/day: 1.00  . Years: 5.00  . Pack years: 5.00  . Types: Cigarettes  . Last attempt to quit: 04/25/1967  . Years since quitting: 50.6  Smokeless Tobacco Never Used    Goals Met:  Exercise tolerated well No report of cardiac concerns or symptoms Strength training completed today  Goals Unmet:  Not Applicable  Comments: Service time is from 1330 to 1500    Dr. Wesam G. Yacoub is Medical Director for Pulmonary Rehab at [facility]. 

## 2017-12-06 ENCOUNTER — Encounter (HOSPITAL_COMMUNITY)
Admission: RE | Admit: 2017-12-06 | Discharge: 2017-12-06 | Disposition: A | Discharge status: Home / Self Care | Payer: No Typology Code available for payment source | Source: Ambulatory Visit | Attending: Pulmonary Disease | Admitting: Pulmonary Disease

## 2017-12-06 DIAGNOSIS — J449 Chronic obstructive pulmonary disease, unspecified: Secondary | ICD-10-CM

## 2017-12-06 NOTE — Progress Notes (Signed)
Daily Session Note  Patient Details  Name: Matthew Patrick MRN: [id number] Date of Birth: 1945/05/20 Referring Provider:     Pulmonary Rehab Walk Test from 11/20/2017 in [place]  Referring Provider  Dr. Nelda Marseille      Encounter Date: 12/06/2017  Check In: Session Check In - 12/06/17 1621      Check-In   Supervising physician immediately available to respond to emergencies  [facility] immediately available    Physician(s)  Dr. Florene Glen    Location  [facility]    Staff Present  Maurice Small, RN, BSN;Hasana Alcorta, MS, ACSM RCEP, Exercise Physiologist;Annedrea [place]house, RN, [place]    Medication changes reported      No    Fall or balance concerns reported     No    Tobacco Cessation  No Change    Warm-up and Cool-down  Performed as group-led instruction    Resistance Training Performed  Yes    VAD Patient?  No    PAD/SET Patient?  No      Pain Assessment   Currently in Pain?  No/denies    Multiple Pain Sites  No       Capillary Blood Glucose: No results found for this or any previous visit (from the past 24 hour(s)).    Social History   Tobacco Use  Smoking Status Former Smoker  . Packs/day: 1.00  . Years: 5.00  . Pack years: 5.00  . Types: Cigarettes  . Last attempt to quit: 04/25/1967  . Years since quitting: 50.6  Smokeless Tobacco Never Used    Goals Met:  Exercise tolerated well Personal goals reviewed  Goals Unmet:  Not Applicable  Comments: Service time is from 1:30p to 3:30p    Dr. Rush Farmer is Medical Director for Pulmonary Rehab at [facility]

## 2017-12-11 ENCOUNTER — Encounter (HOSPITAL_COMMUNITY)
Admission: RE | Admit: 2017-12-11 | Discharge: 2017-12-11 | Disposition: A | Discharge status: Home / Self Care | Payer: No Typology Code available for payment source | Source: Ambulatory Visit | Attending: Pulmonary Disease | Admitting: Pulmonary Disease

## 2017-12-11 VITALS — Wt 153.9 lb

## 2017-12-11 DIAGNOSIS — J449 Chronic obstructive pulmonary disease, unspecified: Secondary | ICD-10-CM

## 2017-12-11 NOTE — Progress Notes (Signed)
Daily Session Note  Patient Details  Name: Matthew Patrick MRN: [id number] Date of Birth: [birth year] Referring Provider:     Pulmonary Rehab Walk Test from 11/20/2017 in [place]  Referring Provider  Dr. Nelda Marseille      Encounter Date: 12/11/2017  Check In: Session Check In - 12/11/17 1405      Check-In   Supervising physician immediately available to respond to emergencies  [facility] immediately available    Physician(s)  Dr. Florene Glen    Location  [facility]    Staff Present  Maurice Small, RN, BSN;Molly DiVincenzo, MS, ACSM RCEP, Exercise Physiologist;Annedrea Rosezella [place], RN, MHA;Olinty [place], MS, ACSM CEP, Exercise Physiologist    Medication changes reported      No    Fall or balance concerns reported     No    Tobacco Cessation  No Change    Warm-up and Cool-down  Performed as group-led instruction    Resistance Training Performed  Yes    VAD Patient?  No    PAD/SET Patient?  No      Pain Assessment   Currently in Pain?  No/denies       Capillary Blood Glucose: No results found for this or any previous visit (from the past 24 hour(s)).  Exercise Prescription Changes - 12/11/17 1500      Response to Exercise   Blood Pressure (Admit)  112/70    Blood Pressure (Exercise)  116/50    Blood Pressure (Exit)  110/60    Heart Rate (Admit)  74 bpm    Heart Rate (Exercise)  92 bpm    Heart Rate (Exit)  82 bpm    Oxygen Saturation (Admit)  99 %    Oxygen Saturation (Exercise)  96 %    Oxygen Saturation (Exit)  96 %    Rating of Perceived Exertion (Exercise)  13    Perceived Dyspnea (Exercise)  2    Duration  Progress to 45 minutes of aerobic exercise without signs/symptoms of physical distress    Intensity  THRR unchanged      Progression   Progression  Continue to progress workloads to maintain intensity without signs/symptoms of physical distress.      Resistance Training   Training Prescription  Yes    Weight   blue bands    Reps  10-15    Time  10 Minutes      Interval Training   Interval Training  No      Bike   Level  0.8    Minutes  17      NuStep   Level  3    SPM  80    Minutes  17    METs  2.3      Track   Laps  16    Minutes  17       Social History   Tobacco Use  Smoking Status Former Smoker  . Packs/day: 1.00  . Years: 5.00  . Pack years: 5.00  . Types: Cigarettes  . Last attempt to quit: 04/25/1967  . Years since quitting: 50.6  Smokeless Tobacco Never Used    Goals Met:  Exercise tolerated well No report of cardiac concerns or symptoms Strength training completed today  Goals Unmet:  Not Applicable  Comments: Service time is from 1330 to 1510    Dr. Rush Farmer is Medical Director for Pulmonary Rehab at [facility].

## 2017-12-12 NOTE — Progress Notes (Signed)
Pulmonary Individual Treatment Plan  Patient Details  Name: Matthew Patrick MRN: [id number] Date of Birth: May 26, 1945 Referring Provider:     Pulmonary Rehab Walk Test from 11/20/2017 in [place]  Referring Provider  Dr. Nelda Marseille      Initial Encounter Date:    Pulmonary Rehab Walk Test from 11/20/2017 in [place]  Date  11/20/17      Visit Diagnosis: COPD with asthma ([place])  Patient's Home Medications on Admission:   Current Outpatient Medications:  .  albuterol (PROVENTIL HFA;VENTOLIN HFA) 108 (90 Base) MCG/ACT inhaler, Inhale into the lungs., Disp: , Rfl:  .  amLODipine (NORVASC) 5 MG tablet, Take 5 mg by mouth every morning., Disp: , Rfl:  .  aspirin 81 MG tablet, Take 81 mg by mouth daily., Disp: , Rfl:  .  B Complex-C (B-COMPLEX WITH VITAMIN C) tablet, Take by mouth., Disp: , Rfl:  .  escitalopram (LEXAPRO) 5 MG tablet, Take 2.5 mg by mouth daily., Disp: , Rfl:  .  fluticasone (FLONASE) 50 MCG/ACT nasal spray, 2 sprays by Both Nostrils route daily as needed., Disp: , Rfl:  .  fluticasone furoate-vilanterol (BREO ELLIPTA) 100-25 MCG/INH AEPB, Inhale 1 puff into the lungs daily. (Patient not taking: Reported on 11/19/2017), Disp: 1 each, Rfl: 5 .  loratadine (CLARITIN) 10 MG tablet, Take 10 mg by mouth every morning., Disp: , Rfl:  .  LORazepam (ATIVAN) 0.5 MG tablet, Take 0.5 tablets (0.25 mg total) by mouth 4 (four) times daily. (Patient taking differently: Take 0.25 mg by mouth 4 (four) times daily as needed for anxiety. ), Disp: 30 tablet, Rfl: 0 .  Melatonin 10 MG TABS, Take 10 mg by mouth at bedtime. , Disp: , Rfl:  .  Misc Natural Products (PROSTATE HEALTH) CAPS, Take 1 capsule by mouth daily., Disp: , Rfl:  .  Multiple Vitamins-Minerals (EYE HEALTH PO), Take 1 tablet by mouth daily., Disp: , Rfl:  .  Polyethyl Glycol-Propyl Glycol (SYSTANE) 0.4-0.3 % GEL ophthalmic gel, Place 1 drop into both eyes 2 (two) times  daily., Disp: , Rfl:  .  rOPINIRole HCl (REQUIP PO), Take 1 tablet by mouth 3 (three) times daily. 7/29 -For the third dose pt takes two tablets, Disp: , Rfl:  .  Suvorexant (BELSOMRA) 20 MG TABS, Take 20 mg by mouth at bedtime., Disp: , Rfl:  .  tamsulosin (FLOMAX) 0.4 MG CAPS capsule, Take 0.4 mg by mouth at bedtime. 7/29 Takes two tablets at bedtime., Disp: , Rfl:  .  Tiotropium Bromide Monohydrate (SPIRIVA RESPIMAT) 2.5 MCG/ACT AERS, Inhale 2 puffs into the lungs daily., Disp: , Rfl:  .  traZODone (DESYREL) 50 MG tablet, Take 100 mg by mouth at bedtime. , Disp: , Rfl:   Past Medical History: Past Medical History:  Diagnosis Date  . Anxiety   . Asthma   . Depression   . Hyperlipemia   . Hypertension   . Sleep apnea    has c-pap     Tobacco Use: Social History   Tobacco Use  Smoking Status Former Smoker  . Packs/day: 1.00  . Years: 5.00  . Pack years: 5.00  . Types: Cigarettes  . Last attempt to quit: 04/25/1967  . Years since quitting: 50.6  Smokeless Tobacco Never Used    Labs: Recent Chemical engineer    Labs for ITP Cardiac and Pulmonary Rehab Latest Ref Rng & Units 03/26/2011 03/26/2011 09/17/2015 04/10/2016 01/11/2017   Cholestrol 0 -  200 mg/dL 126 124 173 - -   LDLCALC 0 - 99 mg/dL 46 43 87 - -   HDL >40 mg/dL 44 45 58 - -   Trlycerides <150 mg/dL 178(H) 181(H) 140 - -   Hemoglobin A1c 4.0 - 6.0 % 5.7(H) - 5.7 - -   TCO2 22 - 32 mmol/L - - - 28 27      Capillary Blood Glucose: Lab Results  Component Value Date   GLUCAP 90 01/11/2017     Pulmonary Assessment Scores: Pulmonary Assessment Scores    Row Name 11/22/17 0721         ADL UCSD   ADL Phase  Entry       mMRC Score   mMRC Score  0        Pulmonary Function Assessment:   Exercise Target Goals: Exercise Program Goal: Individual exercise prescription set using results from initial 6 min walk test and THRR while considering  patient's activity barriers and safety.   Exercise  Prescription Goal: Initial exercise prescription builds to 30-45 minutes a day of aerobic activity, 2-3 days per week.  Home exercise guidelines will be given to patient during program as part of exercise prescription that the participant will acknowledge.  Activity Barriers & Risk Stratification: Activity Barriers & Cardiac Risk Stratification - 11/19/17 1415      Activity Barriers & Cardiac Risk Stratification   Activity Barriers  Other (comment)    Comments  tremors and involuntary muscle movements    Cardiac Risk Stratification  Moderate       6 Minute Walk: 6 Minute Walk    Row Name 11/22/17 0719         6 Minute Walk   Phase  Initial     Distance  1250 feet     Walk Time  6 minutes     # of Rest Breaks  0     MPH  2.36     METS  2.84     RPE  11     Perceived Dyspnea   1     Symptoms  No     Resting HR  70 bpm     Resting BP  124/60     Resting Oxygen Saturation   94 %     Exercise Oxygen Saturation  during 6 min walk  93 %     Max Ex. HR  93 bpm     Max Ex. BP  144/60       Interval HR   1 Minute HR  69     2 Minute HR  82     3 Minute HR  93     4 Minute HR  83     5 Minute HR  82     6 Minute HR  79     2 Minute Post HR  70     Interval Heart Rate?  Yes       Interval Oxygen   Interval Oxygen?  Yes     Baseline Oxygen Saturation %  94 %     1 Minute Oxygen Saturation %  93 %     1 Minute Liters of Oxygen  0 L     2 Minute Oxygen Saturation %  94 %     2 Minute Liters of Oxygen  0 L     3 Minute Oxygen Saturation %  96 %     3 Minute Liters of Oxygen  0  L     4 Minute Oxygen Saturation %  94 %     4 Minute Liters of Oxygen  0 L     5 Minute Oxygen Saturation %  95 %     5 Minute Liters of Oxygen  0 L     6 Minute Oxygen Saturation %  95 %     6 Minute Liters of Oxygen  0 L     2 Minute Post Oxygen Saturation %  95 %     2 Minute Post Liters of Oxygen  0 L        Oxygen Initial Assessment: Oxygen Initial Assessment - 11/22/17 0719       Initial 6 min Walk   Oxygen Used  None      Program Oxygen Prescription   Program Oxygen Prescription  None       Oxygen Re-Evaluation: Oxygen Re-Evaluation    Row Name 12/10/17 1634             Program Oxygen Prescription   Program Oxygen Prescription  None         Home Oxygen   Home Oxygen Device  None       Sleep Oxygen Prescription  None       Home Exercise Oxygen Prescription  None       Home at Rest Exercise Oxygen Prescription  None          Oxygen Discharge (Final Oxygen Re-Evaluation): Oxygen Re-Evaluation - 12/10/17 1634      Program Oxygen Prescription   Program Oxygen Prescription  None      Home Oxygen   Home Oxygen Device  None    Sleep Oxygen Prescription  None    Home Exercise Oxygen Prescription  None    Home at Rest Exercise Oxygen Prescription  None       Initial Exercise Prescription: Initial Exercise Prescription - 11/22/17 0700      Date of Initial Exercise RX and Referring Provider   Date  11/20/17    Referring Provider  Dr. Nelda Marseille      Bike   Level  0.4    Minutes  17      NuStep   Level  2    SPM  80    Minutes  17    METs  1.5      Track   Laps  10    Minutes  17      Prescription Details   Frequency (times per week)  2    Duration  Progress to 45 minutes of aerobic exercise without signs/symptoms of physical distress      Intensity   THRR 40-80% of Max Heartrate  59-118    Ratings of Perceived Exertion  11-13    Perceived Dyspnea  0-4      Progression   Progression  Continue progressive overload as per policy without signs/symptoms or physical distress.      Resistance Training   Training Prescription  Yes    Weight  blue bands    Reps  10-15       Perform Capillary Blood Glucose checks as needed.  Exercise Prescription Changes:  Exercise Prescription Changes    Row Name 11/27/17 1600 12/11/17 1500           Response to Exercise   Blood Pressure (Admit)  120/60  112/70      Blood Pressure  (Exercise)  142/58  116/50      Blood  Pressure (Exit)  118/62  110/60      Heart Rate (Admit)  69 bpm  74 bpm      Heart Rate (Exercise)  83 bpm  92 bpm      Heart Rate (Exit)  68 bpm  82 bpm      Oxygen Saturation (Admit)  97 %  99 %      Oxygen Saturation (Exercise)  95 %  96 %      Oxygen Saturation (Exit)  98 %  96 %      Rating of Perceived Exertion (Exercise)  13  13      Perceived Dyspnea (Exercise)  2  2      Duration  Progress to 45 minutes of aerobic exercise without signs/symptoms of physical distress  Progress to 45 minutes of aerobic exercise without signs/symptoms of physical distress      Intensity  Other (comment) 40-80 % of HRR  THRR unchanged        Progression   Progression  Continue to progress workloads to maintain intensity without signs/symptoms of physical distress.  Continue to progress workloads to maintain intensity without signs/symptoms of physical distress.        Resistance Training   Training Prescription  Yes  Yes      Weight  blue bands  blue bands      Reps  10-15  10-15      Time  10 Minutes  10 Minutes        Interval Training   Interval Training  No  No        Bike   Level  0.6  0.8      Minutes  17  17        NuStep   Level  2  3      SPM  80  80      Minutes  17  17      METs  1.4  2.3        Track   Laps  -  16      Minutes  -  17         Exercise Comments:   Exercise Goals and Review:  Exercise Goals    Row Name 11/19/17 1416             Exercise Goals   Increase Physical Activity  Yes       Intervention  Provide advice, education, support and counseling about physical activity/exercise needs.;Develop an individualized exercise prescription for aerobic and resistive training based on initial evaluation findings, risk stratification, comorbidities and participant's personal goals.       Expected Outcomes  Short Term: Attend rehab on a regular basis to increase amount of physical activity.;Long Term: Add in home exercise  to make exercise part of routine and to increase amount of physical activity.;Long Term: Exercising regularly at least 3-5 days a week.       Increase Strength and Stamina  Yes       Intervention  Provide advice, education, support and counseling about physical activity/exercise needs.;Develop an individualized exercise prescription for aerobic and resistive training based on initial evaluation findings, risk stratification, comorbidities and participant's personal goals.       Expected Outcomes  Short Term: Increase workloads from initial exercise prescription for resistance, speed, and METs.;Short Term: Perform resistance training exercises routinely during rehab and add in resistance training at home;Long Term: Improve cardiorespiratory fitness, muscular endurance and strength as measured by  increased METs and functional capacity (6MWT)       Able to understand and use rate of perceived exertion (RPE) scale  Yes       Intervention  Provide education and explanation on how to use RPE scale       Expected Outcomes  Short Term: Able to use RPE daily in rehab to express subjective intensity level;Long Term:  Able to use RPE to guide intensity level when exercising independently       Able to understand and use Dyspnea scale  Yes       Intervention  Provide education and explanation on how to use Dyspnea scale       Expected Outcomes  Short Term: Able to use Dyspnea scale daily in rehab to express subjective sense of shortness of breath during exertion;Long Term: Able to use Dyspnea scale to guide intensity level when exercising independently       Knowledge and understanding of Target Heart Rate Range (THRR)  Yes       Intervention  Provide education and explanation of THRR including how the numbers were predicted and where they are located for reference       Expected Outcomes  Short Term: Able to state/look up THRR;Long Term: Able to use THRR to govern intensity when exercising independently;Short Term:  Able to use daily as guideline for intensity in rehab       Understanding of Exercise Prescription  Yes       Intervention  Provide education, explanation, and written materials on patient's individual exercise prescription       Expected Outcomes  Short Term: Able to explain program exercise prescription;Long Term: Able to explain home exercise prescription to exercise independently          Exercise Goals Re-Evaluation : Exercise Goals Re-Evaluation    Row Name 12/10/17 1634             Exercise Goal Re-Evaluation   Exercise Goals Review  Understanding of Exercise Prescription;Knowledge and understanding of Target Heart Rate Range (THRR);Able to understand and use Dyspnea scale;Able to understand and use rate of perceived exertion (RPE) scale;Increase Strength and Stamina;Increase Physical Activity       Comments  The patient has only attended 4 rehab sessions. Will cont. to monitor and progress.        Expected Outcomes  Through exercise at rehab and at home, patient will increase physical capacity and ADL's will be easier to perform. The patient will also feel comfortable establishing a home exercise program.          Discharge Exercise Prescription (Final Exercise Prescription Changes): Exercise Prescription Changes - 12/11/17 1500      Response to Exercise   Blood Pressure (Admit)  112/70    Blood Pressure (Exercise)  116/50    Blood Pressure (Exit)  110/60    Heart Rate (Admit)  74 bpm    Heart Rate (Exercise)  92 bpm    Heart Rate (Exit)  82 bpm    Oxygen Saturation (Admit)  99 %    Oxygen Saturation (Exercise)  96 %    Oxygen Saturation (Exit)  96 %    Rating of Perceived Exertion (Exercise)  13    Perceived Dyspnea (Exercise)  2    Duration  Progress to 45 minutes of aerobic exercise without signs/symptoms of physical distress    Intensity  THRR unchanged      Progression   Progression  Continue to progress workloads to maintain intensity  without signs/symptoms of  physical distress.      Resistance Training   Training Prescription  Yes    Weight  blue bands    Reps  10-15    Time  10 Minutes      Interval Training   Interval Training  No      Bike   Level  0.8    Minutes  17      NuStep   Level  3    SPM  80    Minutes  17    METs  2.3      Track   Laps  16    Minutes  17       Nutrition:  Target Goals: Understanding of nutrition guidelines, daily intake of sodium <1578m, cholesterol <204m calories 30% from fat and 7% or less from saturated fats, daily to have 5 or more servings of fruits and vegetables.  Biometrics:    Nutrition Therapy Plan and Nutrition Goals:   Nutrition Assessments:   Nutrition Goals Re-Evaluation:   Nutrition Goals Discharge (Final Nutrition Goals Re-Evaluation):   Psychosocial: Target Goals: Acknowledge presence or absence of significant depression and/or stress, maximize coping skills, provide positive support system. Participant is able to verbalize types and ability to use techniques and skills needed for reducing stress and depression.  Initial Review & Psychosocial Screening: Initial Psych Review & Screening - 11/19/17 1413      Initial Review   Current issues with  Current Depression;History of Depression;Current Anxiety/Panic;Current Stress Concerns;Current Psychotropic Meds;Current Sleep Concerns       Quality of Life Scores:  Scores of 19 and below usually indicate a poorer quality of life in these areas.  A difference of  2-3 points is a clinically meaningful difference.  A difference of 2-3 points in the total score of the Quality of Life Index has been associated with significant improvement in overall quality of life, self-image, physical symptoms, and general health in studies assessing change in quality of life.  PHQ-9: Recent Review Flowsheet Data    [facility] 11/19/2017 11/19/2017   Decreased Interest 3 3   Down, Depressed, Hopeless 3 3   PHQ - 2 Score 6  6   Altered sleeping 3 3   Tired, decreased energy 3 3   Change in appetite 0 0   Feeling bad or failure about yourself  3 3   Trouble concentrating 3 3   Moving slowly or fidgety/restless 3 3   Suicidal thoughts 0 0   PHQ-9 Score 21 21   Difficult doing work/chores Extremely dIfficult Very difficult     Interpretation of Total Score  Total Score Depression Severity:  1-4 = Minimal depression, 5-9 = Mild depression, 10-14 = Moderate depression, 15-19 = Moderately severe depression, 20-27 = Severe depression   Psychosocial Evaluation and Intervention: Psychosocial Evaluation - 11/19/17 1412      Psychosocial Evaluation & Interventions   Interventions  Stress management education;Relaxation education;Encouraged to exercise with the program and follow exercise prescription    Comments  Pt will report positive coping skills for his stressors    Expected Outcomes  Pt will be able to cope with stressors, continue treatment [facility]:   Psychosocial Discharge (Final Psychosocial Re-Evaluation):   Education: Education Goals: Education classes will be provided on a weekly basis, covering required topics. Participant will state understanding/return demonstration of topics presented.  Learning Barriers/Preferences: Learning Barriers/Preferences - 11/19/17 1413  Learning Barriers/Preferences   Learning Barriers  Sight   memory issues   Learning Preferences  Individual Instruction;Pictoral;Verbal Instruction;Group Instruction       Education Topics: Risk Factor Reduction:  -Group instruction that is supported by a PowerPoint presentation. Instructor discusses the definition of a risk factor, different risk factors for pulmonary disease, and how the heart and lungs work together.     Nutrition for Pulmonary Patient:  -Group instruction provided by PowerPoint slides, verbal discussion, and written materials to support subject matter. The  instructor gives an explanation and review of healthy diet recommendations, which includes a discussion on weight management, recommendations for fruit and vegetable consumption, as well as protein, fluid, caffeine, fiber, sodium, sugar, and alcohol. Tips for eating when patients are short of breath are discussed.   Pursed Lip Breathing:  -Group instruction that is supported by demonstration and informational handouts. Instructor discusses the benefits of pursed lip and diaphragmatic breathing and detailed demonstration on how to preform both.     Oxygen Safety:  -Group instruction provided by PowerPoint, verbal discussion, and written material to support subject matter. There is an overview of "What is Oxygen" and "Why do we need it".  Instructor also reviews how to create a safe environment for oxygen use, the importance of using oxygen as prescribed, and the risks of noncompliance. There is a brief discussion on traveling with oxygen and resources the patient may utilize.   Oxygen Equipment:  -Group instruction provided by [facility] Staff utilizing handouts, written materials, and equipment demonstrations.   Signs and Symptoms:  -Group instruction provided by written material and verbal discussion to support subject matter. Warning signs and symptoms of infection, stroke, and heart attack are reviewed and when to call the physician/911 reinforced. Tips for preventing the spread of infection discussed.   Advanced Directives:  -Group instruction provided by verbal instruction and written material to support subject matter. Instructor reviews Advanced Directive laws and proper instruction for filling out document.   Pulmonary Video:  -Group video education that reviews the importance of medication and oxygen compliance, exercise, good nutrition, pulmonary hygiene, and pursed lip and diaphragmatic breathing for the pulmonary patient.   Exercise for the Pulmonary Patient:  -Group  instruction that is supported by a PowerPoint presentation. Instructor discusses benefits of exercise, core components of exercise, frequency, duration, and intensity of an exercise routine, importance of utilizing pulse oximetry during exercise, safety while exercising, and options of places to exercise outside of rehab.     Pulmonary Medications:  -Verbally interactive group education provided by instructor with focus on inhaled medications and proper administration.   Anatomy and Physiology of the Respiratory System and Intimacy:  -Group instruction provided by PowerPoint, verbal discussion, and written material to support subject matter. Instructor reviews respiratory cycle and anatomical components of the respiratory system and their functions. Instructor also reviews differences in obstructive and restrictive respiratory diseases with examples of each. Intimacy, Sex, and Sexuality differences are reviewed with a discussion on how relationships can change when diagnosed with pulmonary disease. Common sexual concerns are reviewed.   MD DAY -A group question and answer session with a medical doctor that allows participants to ask questions that relate to their pulmonary disease state.   PULMONARY REHAB CHRONIC OBSTRUCTIVE PULMONARY DISEASE from 12/06/2017 in [place]  Date  11/27/17  Educator  Dr. Nelda Marseille  Instruction Review Code  1- Verbalizes Understanding      OTHER EDUCATION -Group or individual verbal, written, or  video instructions that support the educational goals of the pulmonary rehab program.   Holiday Eating Survival Tips:  -Group instruction provided by PowerPoint slides, verbal discussion, and written materials to support subject matter. The instructor gives patients tips, tricks, and techniques to help them not only survive but enjoy the holidays despite the onslaught of food that accompanies the holidays.   Knowledge Questionnaire  Score:   Core Components/Risk Factors/Patient Goals at Admission: Personal Goals and Risk Factors at Admission - 11/19/17 1405      Core Components/Risk Factors/Patient Goals on Admission    Weight Management  Weight Maintenance;Weight Gain;Yes    Intervention  Weight Management: Develop a combined nutrition and exercise program designed to reach desired caloric intake, while maintaining appropriate intake of nutrient and fiber, sodium and fats, and appropriate energy expenditure required for the weight goal.;Weight Management: Provide education and appropriate resources to help participant work on and attain dietary goals.;Weight Management/Obesity: Establish reasonable short term and long term weight goals.    Admit Weight  152 lb 1.9 oz (69 kg)    Improve shortness of breath with ADL's  Yes    Intervention  Provide education, individualized exercise plan and daily activity instruction to help decrease symptoms of SOB with activities of daily living.    Expected Outcomes  Short Term: Improve cardiorespiratory fitness to achieve a reduction of symptoms when performing ADLs;Long Term: Be able to perform more ADLs without symptoms or delay the onset of symptoms    Stress  Yes    Intervention  Offer individual and/or small group education and counseling on adjustment to heart disease, stress management and health-related lifestyle change. Teach and support self-help strategies.    Expected Outcomes  Short Term: Participant demonstrates changes in health-related behavior, relaxation and other stress management skills, ability to obtain effective social support, and compliance with psychotropic medications if prescribed.;Long Term: Emotional wellbeing is indicated by absence of clinically significant psychosocial distress or social isolation.       Core Components/Risk Factors/Patient Goals Review:  Goals and Risk Factor Review    Row Name 12/12/17 1256             Core Components/Risk  Factors/Patient Goals Review   Personal Goals Review  Weight Management/Obesity;Improve shortness of breath with ADL's;Develop more efficient breathing techniques such as purse lipped breathing and diaphragmatic breathing and practicing self-pacing with activity.;Increase knowledge of respiratory medications and ability to use respiratory devices properly.;Stress       Review  Pt has completed 5 exercise sessions since 11/27/17. This is tremendous for this pt who has severe depression and becomes very anxious over everything,  wife is very supportive and faithfully brings him to exercise.  Pt is working on learning PLB and diphragmatic breathing.  I anticipate with consistent coaching this will become very second nature to pt.  Pt shows slight weight gain of .1kg.  Pt has not yet started home exercise.  Pt receives his depression managment at [facility].  Pt feels comfortable with them and feels the medications are working for him.   I do anticipate with pt already consistent attendance, he will show measurable progress toward patient goals in the next 30 days assessment.       Expected Outcomes  See Admission Goals/Outcomes          Core Components/Risk Factors/Patient Goals at Discharge (Final Review):  Goals and Risk Factor Review - 12/12/17 1256      Core Components/Risk Factors/Patient Goals Review   Personal  Goals Review  Weight Management/Obesity;Improve shortness of breath with ADL's;Develop more efficient breathing techniques such as purse lipped breathing and diaphragmatic breathing and practicing self-pacing with activity.;Increase knowledge of respiratory medications and ability to use respiratory devices properly.;Stress    Review  Pt has completed 5 exercise sessions since 11/27/17. This is tremendous for this pt who has severe depression and becomes very anxious over everything,  wife is very supportive and faithfully brings him to exercise.  Pt is working on learning PLB and diphragmatic  breathing.  I anticipate with consistent coaching this will become very second nature to pt.  Pt shows slight weight gain of .1kg.  Pt has not yet started home exercise.  Pt receives his depression managment at [facility].  Pt feels comfortable with them and feels the medications are working for him.   I do anticipate with pt already consistent attendance, he will show measurable progress toward patient goals in the next 30 days assessment.    Expected Outcomes  See Admission Goals/Outcomes       ITP Comments: ITP Comments    Row Name 11/19/17 1354 12/12/17 1256         ITP Comments  Dr. Manfred Arch, Medical Director  Dr. Manfred Arch, Medical Director         Comments:  Pt has completed 5 exercise sessions since 11/27/17. Cherre Huger, BSN Cardiac and Training and development officer

## 2017-12-13 ENCOUNTER — Encounter (HOSPITAL_COMMUNITY): Payer: No Typology Code available for payment source

## 2017-12-18 ENCOUNTER — Encounter (HOSPITAL_COMMUNITY): Payer: No Typology Code available for payment source

## 2017-12-20 ENCOUNTER — Encounter (HOSPITAL_COMMUNITY)
Admission: RE | Admit: 2017-12-20 | Discharge: 2017-12-20 | Disposition: A | Discharge status: Home / Self Care | Payer: No Typology Code available for payment source | Source: Ambulatory Visit | Attending: Pulmonary Disease | Admitting: Pulmonary Disease

## 2017-12-20 DIAGNOSIS — J449 Chronic obstructive pulmonary disease, unspecified: Secondary | ICD-10-CM

## 2017-12-20 NOTE — Progress Notes (Signed)
Daily Session Note  Patient Details  Name: Takai Chiaramonte MRN: [id number] Date of Birth: 01/17/1946 Referring Provider:     Pulmonary Rehab Walk Test from 11/20/2017 in [place]  Referring Provider  Dr. Nelda Marseille      Encounter Date: 12/20/2017  Check In: Session Check In - 12/20/17 1353      Check-In   Supervising physician immediately available to respond to emergencies  [facility] immediately available    Physician(s)  Dr. Florene Glen    Location  [facility]    Staff Present  Maurice Small, RN, BSN;Taytum Wheller, MS, ACSM RCEP, Exercise Physiologist;Annedrea Stackhouse, RN, [place]    Medication changes reported      No    Fall or balance concerns reported     No    Tobacco Cessation  No Change    Warm-up and Cool-down  Performed as group-led instruction    Resistance Training Performed  Yes    VAD Patient?  No    PAD/SET Patient?  No      Pain Assessment   Currently in Pain?  No/denies    Multiple Pain Sites  No       Capillary Blood Glucose: No results found for this or any previous visit (from the past 24 hour(s)).    Social History   Tobacco Use  Smoking Status Former Smoker  . Packs/day: 1.00  . Years: 5.00  . Pack years: 5.00  . Types: Cigarettes  . Last attempt to quit: 04/25/1967  . Years since quitting: 50.6  Smokeless Tobacco Never Used    Goals Met:  Exercise tolerated well  Goals Unmet:  Not Applicable  Comments: Service time is from 1:30p to 3:45p    Dr. Rush Farmer is Medical Director for Pulmonary Rehab at [facility].

## 2017-12-25 ENCOUNTER — Encounter (HOSPITAL_COMMUNITY)
Admission: RE | Admit: 2017-12-25 | Discharge: 2017-12-25 | Disposition: A | Discharge status: Home / Self Care | Payer: No Typology Code available for payment source | Source: Ambulatory Visit | Attending: Pulmonary Disease | Admitting: Pulmonary Disease

## 2017-12-25 VITALS — Wt 154.1 lb

## 2017-12-25 DIAGNOSIS — Z7901 Long term (current) use of anticoagulants: Secondary | ICD-10-CM | POA: Insufficient documentation

## 2017-12-25 DIAGNOSIS — F329 Major depressive disorder, single episode, unspecified: Secondary | ICD-10-CM | POA: Diagnosis not present

## 2017-12-25 DIAGNOSIS — Z7951 Long term (current) use of inhaled steroids: Secondary | ICD-10-CM | POA: Insufficient documentation

## 2017-12-25 DIAGNOSIS — I10 Essential (primary) hypertension: Secondary | ICD-10-CM | POA: Diagnosis not present

## 2017-12-25 DIAGNOSIS — J449 Chronic obstructive pulmonary disease, unspecified: Secondary | ICD-10-CM | POA: Insufficient documentation

## 2017-12-25 DIAGNOSIS — Z7952 Long term (current) use of systemic steroids: Secondary | ICD-10-CM | POA: Diagnosis not present

## 2017-12-25 DIAGNOSIS — Z79899 Other long term (current) drug therapy: Secondary | ICD-10-CM | POA: Diagnosis not present

## 2017-12-25 DIAGNOSIS — Z87891 Personal history of nicotine dependence: Secondary | ICD-10-CM | POA: Insufficient documentation

## 2017-12-25 DIAGNOSIS — J45909 Unspecified asthma, uncomplicated: Secondary | ICD-10-CM | POA: Insufficient documentation

## 2017-12-25 DIAGNOSIS — Z7982 Long term (current) use of aspirin: Secondary | ICD-10-CM | POA: Insufficient documentation

## 2017-12-25 NOTE — Progress Notes (Signed)
Daily Session Note  Patient Details  Name: Kayce Betty MRN: [id number] Date of Birth: [birth year] Referring Provider:     Pulmonary Rehab Walk Test from 11/20/2017 in [place]  Referring Provider  Dr. Nelda Marseille      Encounter Date: 12/25/2017  Check In: Session Check In - 12/25/17 1554      Check-In   Supervising physician immediately available to respond to emergencies  [facility] immediately available    Physician(s)  Dr. Karleen Hampshire    Location  [facility]    Staff Present  Maurice Small, RN, BSN;Molly DiVincenzo, MS, ACSM RCEP, Exercise Physiologist;Elain Wixon [place]sidro Evert, RN;Maria Whitaker, RN, BSN    Medication changes reported      No    Fall or balance concerns reported     No    Tobacco Cessation  No Change    Warm-up and Cool-down  Performed as group-led Higher education careers adviser Performed  Yes    VAD Patient?  No    PAD/SET Patient?  No      Pain Assessment   Currently in Pain?  No/denies    Pain Score  0-No pain    Multiple Pain Sites  No       Capillary Blood Glucose: No results found for this or any previous visit (from the past 24 hour(s)).  Exercise Prescription Changes - 12/25/17 1600      Response to Exercise   Blood Pressure (Admit)  130/52    Blood Pressure (Exercise)  128/60    Blood Pressure (Exit)  124/62    Heart Rate (Admit)  66 bpm    Heart Rate (Exercise)  128 bpm    Heart Rate (Exit)  86 bpm    Oxygen Saturation (Admit)  98 %    Oxygen Saturation (Exercise)  95 %    Oxygen Saturation (Exit)  96 %    Rating of Perceived Exertion (Exercise)  13    Perceived Dyspnea (Exercise)  3    Duration  Progress to 45 minutes of aerobic exercise without signs/symptoms of physical distress    Intensity  THRR unchanged      Progression   Progression  Continue to progress workloads to maintain intensity without signs/symptoms of physical distress.      Resistance Training   Training Prescription  Yes     Weight  blue bands    Reps  10-15    Time  10 Minutes      Interval Training   Interval Training  No      Bike   Level  1    Minutes  17      NuStep   Level  3    SPM  80    Minutes  17    METs  2.1      Track   Laps  16    Minutes  17       Social History   Tobacco Use  Smoking Status Former Smoker  . Packs/day: 1.00  . Years: 5.00  . Pack years: 5.00  . Types: Cigarettes  . Last attempt to quit: 04/25/1967  . Years since quitting: 50.7  Smokeless Tobacco Never Used    Goals Met:  Exercise tolerated well No report of cardiac concerns or symptoms Strength training completed today  Goals Unmet:  Not Applicable  Comments: Service time is from 1330 to 1515    Dr. Rush Farmer is Medical Director for Pulmonary  Rehab at [facility].

## 2017-12-27 ENCOUNTER — Encounter (HOSPITAL_COMMUNITY)
Admission: RE | Admit: 2017-12-27 | Discharge: 2017-12-27 | Disposition: A | Discharge status: Home / Self Care | Payer: No Typology Code available for payment source | Source: Ambulatory Visit | Attending: Pulmonary Disease | Admitting: Pulmonary Disease

## 2017-12-27 VITALS — Wt 151.0 lb

## 2017-12-27 DIAGNOSIS — J449 Chronic obstructive pulmonary disease, unspecified: Secondary | ICD-10-CM | POA: Diagnosis not present

## 2017-12-27 NOTE — Progress Notes (Signed)
Daily Session Note  Patient Details  Name: Matthew Patrick MRN: [id number] Date of Birth: 10/08/1945 Referring Provider:     Pulmonary Rehab Walk Test from 11/20/2017 in [place]  Referring Provider  Dr. Nelda Marseille      Encounter Date: 12/27/2017  Check In: Session Check In - 12/27/17 1513      Check-In   Supervising physician immediately available to respond to emergencies  [facility] immediately available    Physician(s)  Dr. Lars Mage     Location  [facility]    Staff Present  Maurice Small, RN, BSN;Tressie Ragin [place] Evert, RN;Joann Rion, RN, BSN;Ramon Dredge, RN, MHA    Medication changes reported      No    Fall or balance concerns reported     No    Tobacco Cessation  No Change    Warm-up and Cool-down  Performed as group-led instruction    Resistance Training Performed  Yes    VAD Patient?  No    PAD/SET Patient?  No      Pain Assessment   Currently in Pain?  No/denies       Capillary Blood Glucose: No results found for this or any previous visit (from the past 24 hour(s)).    Social History   Tobacco Use  Smoking Status Former Smoker  . Packs/day: 1.00  . Years: 5.00  . Pack years: 5.00  . Types: Cigarettes  . Last attempt to quit: 04/25/1967  . Years since quitting: 50.7  Smokeless Tobacco Never Used    Goals Met:  Exercise tolerated well No report of cardiac concerns or symptoms Strength training completed today  Goals Unmet:  Not Applicable  Comments: Service time is from 1330 to 1505    Dr. Rush Farmer is Medical Director for Pulmonary Rehab at [facility].

## 2018-01-01 ENCOUNTER — Telehealth (HOSPITAL_COMMUNITY): Payer: Self-pay | Admitting: Internal Medicine

## 2018-01-01 ENCOUNTER — Encounter (HOSPITAL_COMMUNITY): Payer: No Typology Code available for payment source

## 2018-01-03 ENCOUNTER — Encounter (HOSPITAL_COMMUNITY): Payer: No Typology Code available for payment source

## 2018-01-08 ENCOUNTER — Encounter (HOSPITAL_COMMUNITY)
Admission: RE | Admit: 2018-01-08 | Discharge: 2018-01-08 | Disposition: A | Discharge status: Home / Self Care | Payer: No Typology Code available for payment source | Source: Ambulatory Visit | Attending: Pulmonary Disease | Admitting: Pulmonary Disease

## 2018-01-08 ENCOUNTER — Telehealth (HOSPITAL_COMMUNITY): Payer: Self-pay | Admitting: Internal Medicine

## 2018-01-10 ENCOUNTER — Encounter (HOSPITAL_COMMUNITY): Payer: No Typology Code available for payment source

## 2018-01-10 NOTE — Progress Notes (Signed)
Pulmonary Individual Treatment Plan  Patient Details  Name: Matthew Patrick MRN: [id number] Date of Birth: 08/27/1945 Referring Provider:     Pulmonary Rehab Walk Test from 11/20/2017 in [place]  Referring Provider  Dr. Nelda Marseille      Initial Encounter Date:    Pulmonary Rehab Walk Test from 11/20/2017 in [place]  Date  11/20/17      Visit Diagnosis: COPD with asthma ([place])  Patient's Home Medications on Admission:   Current Outpatient Medications:  .  albuterol (PROVENTIL HFA;VENTOLIN HFA) 108 (90 Base) MCG/ACT inhaler, Inhale into the lungs., Disp: , Rfl:  .  amLODipine (NORVASC) 5 MG tablet, Take 5 mg by mouth every morning., Disp: , Rfl:  .  aspirin 81 MG tablet, Take 81 mg by mouth daily., Disp: , Rfl:  .  B Complex-C (B-COMPLEX WITH VITAMIN C) tablet, Take by mouth., Disp: , Rfl:  .  escitalopram (LEXAPRO) 5 MG tablet, Take 2.5 mg by mouth daily., Disp: , Rfl:  .  fluticasone (FLONASE) 50 MCG/ACT nasal spray, 2 sprays by Both Nostrils route daily as needed., Disp: , Rfl:  .  fluticasone furoate-vilanterol (BREO ELLIPTA) 100-25 MCG/INH AEPB, Inhale 1 puff into the lungs daily. (Patient not taking: Reported on 11/19/2017), Disp: 1 each, Rfl: 5 .  loratadine (CLARITIN) 10 MG tablet, Take 10 mg by mouth every morning., Disp: , Rfl:  .  LORazepam (ATIVAN) 0.5 MG tablet, Take 0.5 tablets (0.25 mg total) by mouth 4 (four) times daily. (Patient taking differently: Take 0.25 mg by mouth 4 (four) times daily as needed for anxiety. ), Disp: 30 tablet, Rfl: 0 .  Melatonin 10 MG TABS, Take 10 mg by mouth at bedtime. , Disp: , Rfl:  .  Misc Natural Products (PROSTATE HEALTH) CAPS, Take 1 capsule by mouth daily., Disp: , Rfl:  .  Multiple Vitamins-Minerals (EYE HEALTH PO), Take 1 tablet by mouth daily., Disp: , Rfl:  .  Polyethyl Glycol-Propyl Glycol (SYSTANE) 0.4-0.3 % GEL ophthalmic gel, Place 1 drop into both eyes 2 (two) times  daily., Disp: , Rfl:  .  rOPINIRole HCl (REQUIP PO), Take 1 tablet by mouth 3 (three) times daily. 7/29 -For the third dose pt takes two tablets, Disp: , Rfl:  .  Suvorexant (BELSOMRA) 20 MG TABS, Take 20 mg by mouth at bedtime., Disp: , Rfl:  .  tamsulosin (FLOMAX) 0.4 MG CAPS capsule, Take 0.4 mg by mouth at bedtime. 7/29 Takes two tablets at bedtime., Disp: , Rfl:  .  Tiotropium Bromide Monohydrate (SPIRIVA RESPIMAT) 2.5 MCG/ACT AERS, Inhale 2 puffs into the lungs daily., Disp: , Rfl:  .  traZODone (DESYREL) 50 MG tablet, Take 100 mg by mouth at bedtime. , Disp: , Rfl:   Past Medical History: Past Medical History:  Diagnosis Date  . Anxiety   . Asthma   . Depression   . Hyperlipemia   . Hypertension   . Sleep apnea    has c-pap     Tobacco Use: Social History   Tobacco Use  Smoking Status Former Smoker  . Packs/day: 1.00  . Years: 5.00  . Pack years: 5.00  . Types: Cigarettes  . Last attempt to quit: 04/25/1967  . Years since quitting: 50.7  Smokeless Tobacco Never Used    Labs: Recent Chemical engineer    Labs for ITP Cardiac and Pulmonary Rehab Latest Ref Rng & Units 03/26/2011 03/26/2011 09/17/2015 04/10/2016 01/11/2017   Cholestrol 0 -  200 mg/dL 126 124 173 - -   LDLCALC 0 - 99 mg/dL 46 43 87 - -   HDL >40 mg/dL 44 45 58 - -   Trlycerides <150 mg/dL 178(H) 181(H) 140 - -   Hemoglobin A1c 4.0 - 6.0 % 5.7(H) - 5.7 - -   TCO2 22 - 32 mmol/L - - - 28 27      Capillary Blood Glucose: Lab Results  Component Value Date   GLUCAP 90 01/11/2017     Pulmonary Assessment Scores: Pulmonary Assessment Scores    Row Name 11/22/17 0721         ADL UCSD   ADL Phase  Entry       mMRC Score   mMRC Score  0        Pulmonary Function Assessment:   Exercise Target Goals: Exercise Program Goal: Individual exercise prescription set using results from initial 6 min walk test and THRR while considering  patient's activity barriers and safety.   Exercise  Prescription Goal: Initial exercise prescription builds to 30-45 minutes a day of aerobic activity, 2-3 days per week.  Home exercise guidelines will be given to patient during program as part of exercise prescription that the participant will acknowledge.  Activity Barriers & Risk Stratification: Activity Barriers & Cardiac Risk Stratification - 11/19/17 1415      Activity Barriers & Cardiac Risk Stratification   Activity Barriers  Other (comment)    Comments  tremors and involuntary muscle movements    Cardiac Risk Stratification  Moderate       6 Minute Walk: 6 Minute Walk    Row Name 11/22/17 0719         6 Minute Walk   Phase  Initial     Distance  1250 feet     Walk Time  6 minutes     # of Rest Breaks  0     MPH  2.36     METS  2.84     RPE  11     Perceived Dyspnea   1     Symptoms  No     Resting HR  70 bpm     Resting BP  124/60     Resting Oxygen Saturation   94 %     Exercise Oxygen Saturation  during 6 min walk  93 %     Max Ex. HR  93 bpm     Max Ex. BP  144/60       Interval HR   1 Minute HR  69     2 Minute HR  82     3 Minute HR  93     4 Minute HR  83     5 Minute HR  82     6 Minute HR  79     2 Minute Post HR  70     Interval Heart Rate?  Yes       Interval Oxygen   Interval Oxygen?  Yes     Baseline Oxygen Saturation %  94 %     1 Minute Oxygen Saturation %  93 %     1 Minute Liters of Oxygen  0 L     2 Minute Oxygen Saturation %  94 %     2 Minute Liters of Oxygen  0 L     3 Minute Oxygen Saturation %  96 %     3 Minute Liters of Oxygen  0  L     4 Minute Oxygen Saturation %  94 %     4 Minute Liters of Oxygen  0 L     5 Minute Oxygen Saturation %  95 %     5 Minute Liters of Oxygen  0 L     6 Minute Oxygen Saturation %  95 %     6 Minute Liters of Oxygen  0 L     2 Minute Post Oxygen Saturation %  95 %     2 Minute Post Liters of Oxygen  0 L        Oxygen Initial Assessment: Oxygen Initial Assessment - 11/22/17 0719       Initial 6 min Walk   Oxygen Used  None      Program Oxygen Prescription   Program Oxygen Prescription  None       Oxygen Re-Evaluation: Oxygen Re-Evaluation    Row Name 12/10/17 1634 01/08/18 0731           Program Oxygen Prescription   Program Oxygen Prescription  None  None        Home Oxygen   Home Oxygen Device  None  None      Sleep Oxygen Prescription  None  None      Home Exercise Oxygen Prescription  None  None      Home at Rest Exercise Oxygen Prescription  None  None         Oxygen Discharge (Final Oxygen Re-Evaluation): Oxygen Re-Evaluation - 01/08/18 0731      Program Oxygen Prescription   Program Oxygen Prescription  None      Home Oxygen   Home Oxygen Device  None    Sleep Oxygen Prescription  None    Home Exercise Oxygen Prescription  None    Home at Rest Exercise Oxygen Prescription  None       Initial Exercise Prescription: Initial Exercise Prescription - 11/22/17 0700      Date of Initial Exercise RX and Referring Provider   Date  11/20/17    Referring Provider  Dr. Nelda Marseille      Bike   Level  0.4    Minutes  17      NuStep   Level  2    SPM  80    Minutes  17    METs  1.5      Track   Laps  10    Minutes  17      Prescription Details   Frequency (times per week)  2    Duration  Progress to 45 minutes of aerobic exercise without signs/symptoms of physical distress      Intensity   THRR 40-80% of Max Heartrate  59-118    Ratings of Perceived Exertion  11-13    Perceived Dyspnea  0-4      Progression   Progression  Continue progressive overload as per policy without signs/symptoms or physical distress.      Resistance Training   Training Prescription  Yes    Weight  blue bands    Reps  10-15       Perform Capillary Blood Glucose checks as needed.  Exercise Prescription Changes: Exercise Prescription Changes    Row Name 11/27/17 1600 12/11/17 1500 12/25/17 1600 12/27/17 1729       Response to Exercise   Blood  Pressure (Admit)  120/60  112/70  130/52  100/50    Blood Pressure (Exercise)  142/58  116/50  128/60  120/62    Blood Pressure (Exit)  118/62  110/60  124/62  110/58    Heart Rate (Admit)  69 bpm  74 bpm  66 bpm  66 bpm    Heart Rate (Exercise)  83 bpm  92 bpm  128 bpm  117 bpm    Heart Rate (Exit)  68 bpm  82 bpm  86 bpm  80 bpm    Oxygen Saturation (Admit)  97 %  99 %  98 %  95 %    Oxygen Saturation (Exercise)  95 %  96 %  95 %  96 %    Oxygen Saturation (Exit)  98 %  96 %  96 %  97 %    Rating of Perceived Exertion (Exercise)  _0 Perceived Dyspnea (Exercise)  _1 Duration  Progress to 45 minutes of aerobic exercise without signs/symptoms of physical distress  Progress to 45 minutes of aerobic exercise without signs/symptoms of physical distress  Progress to 45 minutes of aerobic exercise without signs/symptoms of physical distress  Progress to 45 minutes of aerobic exercise without signs/symptoms of physical distress    Intensity  Other (comment) 40-80 % of HRR  THRR unchanged  THRR unchanged  THRR unchanged      Progression   Progression  Continue to progress workloads to maintain intensity without signs/symptoms of physical distress.  Continue to progress workloads to maintain intensity without signs/symptoms of physical distress.  Continue to progress workloads to maintain intensity without signs/symptoms of physical distress.  Continue to progress workloads to maintain intensity without signs/symptoms of physical distress.      Resistance Training   Training Prescription  Yes  Yes  Yes  Yes    Weight  blue bands  blue bands  blue bands  blue bands    Reps  10-15  10-15  10-15  10-15    Time  10 Minutes  10 Minutes  10 Minutes  10 Minutes      Interval Training   Interval Training  No  No  No  No      Bike   Level  0.6  0._2 Minutes  _3 NuStep   Level  _4 SPM  80  80  80  80    Minutes  _5 METs  1.4   2.3  2.1  2      Track   Laps  -  16  16  -    Minutes  -  17  17  -       Exercise Comments:   Exercise Goals and Review: Exercise Goals    Row Name 11/19/17 1416             Exercise Goals   Increase Physical Activity  Yes       Intervention  Provide advice, education, support and counseling about physical activity/exercise needs.;Develop an individualized exercise prescription for aerobic and resistive training based on initial evaluation findings, risk stratification, comorbidities and participant's personal goals.       Expected Outcomes  Short Term: Attend rehab on a regular basis to increase amount of physical activity.;Long Term: Add in home exercise to make exercise  part of routine and to increase amount of physical activity.;Long Term: Exercising regularly at least 3-5 days a week.       Increase Strength and Stamina  Yes       Intervention  Provide advice, education, support and counseling about physical activity/exercise needs.;Develop an individualized exercise prescription for aerobic and resistive training based on initial evaluation findings, risk stratification, comorbidities and participant's personal goals.       Expected Outcomes  Short Term: Increase workloads from initial exercise prescription for resistance, speed, and METs.;Short Term: Perform resistance training exercises routinely during rehab and add in resistance training at home;Long Term: Improve cardiorespiratory fitness, muscular endurance and strength as measured by increased METs and functional capacity (6MWT)       Able to understand and use rate of perceived exertion (RPE) scale  Yes       Intervention  Provide education and explanation on how to use RPE scale       Expected Outcomes  Short Term: Able to use RPE daily in rehab to express subjective intensity level;Long Term:  Able to use RPE to guide intensity level when exercising independently       Able to understand and use Dyspnea scale  Yes        Intervention  Provide education and explanation on how to use Dyspnea scale       Expected Outcomes  Short Term: Able to use Dyspnea scale daily in rehab to express subjective sense of shortness of breath during exertion;Long Term: Able to use Dyspnea scale to guide intensity level when exercising independently       Knowledge and understanding of Target Heart Rate Range (THRR)  Yes       Intervention  Provide education and explanation of THRR including how the numbers were predicted and where they are located for reference       Expected Outcomes  Short Term: Able to state/look up THRR;Long Term: Able to use THRR to govern intensity when exercising independently;Short Term: Able to use daily as guideline for intensity in rehab       Understanding of Exercise Prescription  Yes       Intervention  Provide education, explanation, and written materials on patient's individual exercise prescription       Expected Outcomes  Short Term: Able to explain program exercise prescription;Long Term: Able to explain home exercise prescription to exercise independently          Exercise Goals Re-Evaluation : Exercise Goals Re-Evaluation    Row Name 12/10/17 1634 01/08/18 0731           Exercise Goal Re-Evaluation   Exercise Goals Review  Understanding of Exercise Prescription;Knowledge and understanding of Target Heart Rate Range (THRR);Able to understand and use Dyspnea scale;Able to understand and use rate of perceived exertion (RPE) scale;Increase Strength and Stamina;Increase Physical Activity  Understanding of Exercise Prescription;Knowledge and understanding of Target Heart Rate Range (THRR);Able to understand and use Dyspnea scale;Able to understand and use rate of perceived exertion (RPE) scale;Increase Strength and Stamina;Increase Physical Activity      Comments  The patient has only attended 4 rehab sessions. Will cont. to monitor and progress.   Patient is progressing well. Patient has increased  average METs from 1.4 to 2.1. Patient is able to walk 16 laps (200 ft each) in 15 minutes. Is open to workload changes. Will monitor and motivate.      Expected Outcomes  Through exercise at rehab and at home, patient will  increase physical capacity and ADL's will be easier to perform. The patient will also feel comfortable establishing a home exercise program.  Through exercise at rehab and at home, patient will increase physical capacity and ADL's will be easier to perform. The patient will also feel comfortable establishing a home exercise program.         Discharge Exercise Prescription (Final Exercise Prescription Changes): Exercise Prescription Changes - 12/27/17 1729      Response to Exercise   Blood Pressure (Admit)  100/50    Blood Pressure (Exercise)  120/62    Blood Pressure (Exit)  110/58    Heart Rate (Admit)  66 bpm    Heart Rate (Exercise)  117 bpm    Heart Rate (Exit)  80 bpm    Oxygen Saturation (Admit)  95 %    Oxygen Saturation (Exercise)  96 %    Oxygen Saturation (Exit)  97 %    Rating of Perceived Exertion (Exercise)  12    Perceived Dyspnea (Exercise)  3    Duration  Progress to 45 minutes of aerobic exercise without signs/symptoms of physical distress    Intensity  THRR unchanged      Progression   Progression  Continue to progress workloads to maintain intensity without signs/symptoms of physical distress.      Resistance Training   Training Prescription  Yes    Weight  blue bands    Reps  10-15    Time  10 Minutes      Interval Training   Interval Training  No      Bike   Level  1    Minutes  17      NuStep   Level  4    SPM  80    Minutes  17    METs  2       Nutrition:  Target Goals: Understanding of nutrition guidelines, daily intake of sodium '1500mg'$ , cholesterol '200mg'$ , calories 30% from fat and 7% or less from saturated fats, daily to have 5 or more servings of fruits and vegetables.  Biometrics:    Nutrition Therapy Plan and  Nutrition Goals:   Nutrition Assessments:   Nutrition Goals Re-Evaluation:   Nutrition Goals Discharge (Final Nutrition Goals Re-Evaluation):   Psychosocial: Target Goals: Acknowledge presence or absence of significant depression and/or stress, maximize coping skills, provide positive support system. Participant is able to verbalize types and ability to use techniques and skills needed for reducing stress and depression.  Initial Review & Psychosocial Screening: Initial Psych Review & Screening - 11/19/17 1413      Initial Review   Current issues with  Current Depression;History of Depression;Current Anxiety/Panic;Current Stress Concerns;Current Psychotropic Meds;Current Sleep Concerns       Quality of Life Scores:  Scores of 19 and below usually indicate a poorer quality of life in these areas.  A difference of  2-3 points is a clinically meaningful difference.  A difference of 2-3 points in the total score of the Quality of Life Index has been associated with significant improvement in overall quality of life, self-image, physical symptoms, and general health in studies assessing change in quality of life.  PHQ-9: Recent Review Flowsheet Data    Depression screen [facility] 2/9 11/19/2017 11/19/2017   Decreased Interest 3 3   Down, Depressed, Hopeless 3 3   PHQ - 2 Score 6 6   Altered sleeping 3 3   Tired, decreased energy 3 3   Change in appetite 0 0  Feeling bad or failure about yourself  3 3   Trouble concentrating 3 3   Moving slowly or fidgety/restless 3 3   Suicidal thoughts 0 0   PHQ-9 Score 21 21   Difficult doing work/chores Extremely dIfficult Very difficult     Interpretation of Total Score  Total Score Depression Severity:  1-4 = Minimal depression, 5-9 = Mild depression, 10-14 = Moderate depression, 15-19 = Moderately severe depression, 20-27 = Severe depression   Psychosocial Evaluation and Intervention: Psychosocial Evaluation - 01/10/18 2224       Psychosocial Evaluation & Interventions   Interventions  Stress management education;Relaxation education;Encouraged to exercise with the program and follow exercise prescription    Comments  Pt with hospitalization for paniac attack and anxiety.  Pt who is veteran and receives PTSD therapy.  pt also has electric shock treatments under the care of a pyschiatrist.     Expected Outcomes  Pt will be able to cope with stressors, continue treatment at [facility].        Psychosocial Re-Evaluation:   Psychosocial Discharge (Final Psychosocial Re-Evaluation):   Education: Education Goals: Education classes will be provided on a weekly basis, covering required topics. Participant will state understanding/return demonstration of topics presented.  Learning Barriers/Preferences: Learning Barriers/Preferences - 11/19/17 1413      Learning Barriers/Preferences   Learning Barriers  Sight   memory issues   Learning Preferences  Individual Instruction;Pictoral;Verbal Instruction;Group Instruction       Education Topics: Risk Factor Reduction:  -Group instruction that is supported by a PowerPoint presentation. Instructor discusses the definition of a risk factor, different risk factors for pulmonary disease, and how the heart and lungs work together.     Nutrition for Pulmonary Patient:  -Group instruction provided by PowerPoint slides, verbal discussion, and written materials to support subject matter. The instructor gives an explanation and review of healthy diet recommendations, which includes a discussion on weight management, recommendations for fruit and vegetable consumption, as well as protein, fluid, caffeine, fiber, sodium, sugar, and alcohol. Tips for eating when patients are short of breath are discussed.   PULMONARY REHAB CHRONIC OBSTRUCTIVE PULMONARY DISEASE from 12/20/2017 in [place]  Date  12/20/17  Educator  Rodman Pickle  Instruction Review Code  2-  Demonstrated Understanding      Pursed Lip Breathing:  -Group instruction that is supported by demonstration and informational handouts. Instructor discusses the benefits of pursed lip and diaphragmatic breathing and detailed demonstration on how to preform both.     Oxygen Safety:  -Group instruction provided by PowerPoint, verbal discussion, and written material to support subject matter. There is an overview of "What is Oxygen" and "Why do we need it".  Instructor also reviews how to create a safe environment for oxygen use, the importance of using oxygen as prescribed, and the risks of noncompliance. There is a brief discussion on traveling with oxygen and resources the patient may utilize.   Oxygen Equipment:  -Group instruction provided by [facility] Staff utilizing handouts, written materials, and equipment demonstrations.   Signs and Symptoms:  -Group instruction provided by written material and verbal discussion to support subject matter. Warning signs and symptoms of infection, stroke, and heart attack are reviewed and when to call the physician/911 reinforced. Tips for preventing the spread of infection discussed.   Advanced Directives:  -Group instruction provided by verbal instruction and written material to support subject matter. Instructor reviews Advanced Directive laws and proper instruction for filling out document.  Pulmonary Video:  -Group video education that reviews the importance of medication and oxygen compliance, exercise, good nutrition, pulmonary hygiene, and pursed lip and diaphragmatic breathing for the pulmonary patient.   Exercise for the Pulmonary Patient:  -Group instruction that is supported by a PowerPoint presentation. Instructor discusses benefits of exercise, core components of exercise, frequency, duration, and intensity of an exercise routine, importance of utilizing pulse oximetry during exercise, safety while exercising, and options of places  to exercise outside of rehab.     Pulmonary Medications:  -Verbally interactive group education provided by instructor with focus on inhaled medications and proper administration.   Anatomy and Physiology of the Respiratory System and Intimacy:  -Group instruction provided by PowerPoint, verbal discussion, and written material to support subject matter. Instructor reviews respiratory cycle and anatomical components of the respiratory system and their functions. Instructor also reviews differences in obstructive and restrictive respiratory diseases with examples of each. Intimacy, Sex, and Sexuality differences are reviewed with a discussion on how relationships can change when diagnosed with pulmonary disease. Common sexual concerns are reviewed.   MD DAY -A group question and answer session with a medical doctor that allows participants to ask questions that relate to their pulmonary disease state.   PULMONARY REHAB CHRONIC OBSTRUCTIVE PULMONARY DISEASE from 12/20/2017 in [place]  Date  11/27/17  Educator  Dr. Nelda Marseille  Instruction Review Code  1- Verbalizes Understanding      OTHER EDUCATION -Group or individual verbal, written, or video instructions that support the educational goals of the pulmonary rehab program.   Holiday Eating Survival Tips:  -Group instruction provided by PowerPoint slides, verbal discussion, and written materials to support subject matter. The instructor gives patients tips, tricks, and techniques to help them not only survive but enjoy the holidays despite the onslaught of food that accompanies the holidays.   Knowledge Questionnaire Score:   Core Components/Risk Factors/Patient Goals at Admission: Personal Goals and Risk Factors at Admission - 11/19/17 1405      Core Components/Risk Factors/Patient Goals on Admission    Weight Management  Weight Maintenance;Weight Gain;Yes    Intervention  Weight Management: Develop a  combined nutrition and exercise program designed to reach desired caloric intake, while maintaining appropriate intake of nutrient and fiber, sodium and fats, and appropriate energy expenditure required for the weight goal.;Weight Management: Provide education and appropriate resources to help participant work on and attain dietary goals.;Weight Management/Obesity: Establish reasonable short term and long term weight goals.    Admit Weight  152 lb 1.9 oz (69 kg)    Improve shortness of breath with ADL's  Yes    Intervention  Provide education, individualized exercise plan and daily activity instruction to help decrease symptoms of SOB with activities of daily living.    Expected Outcomes  Short Term: Improve cardiorespiratory fitness to achieve a reduction of symptoms when performing ADLs;Long Term: Be able to perform more ADLs without symptoms or delay the onset of symptoms    Stress  Yes    Intervention  Offer individual and/or small group education and counseling on adjustment to heart disease, stress management and health-related lifestyle change. Teach and support self-help strategies.    Expected Outcomes  Short Term: Participant demonstrates changes in health-related behavior, relaxation and other stress management skills, ability to obtain effective social support, and compliance with psychotropic medications if prescribed.;Long Term: Emotional wellbeing is indicated by absence of clinically significant psychosocial distress or social isolation.  Core Components/Risk Factors/Patient Goals Review:  Goals and Risk Factor Review    Row Name 12/12/17 1256 01/10/18 2229           Core Components/Risk Factors/Patient Goals Review   Personal Goals Review  Weight Management/Obesity;Improve shortness of breath with ADL's;Develop more efficient breathing techniques such as purse lipped breathing and diaphragmatic breathing and practicing self-pacing with activity.;Increase knowledge of  respiratory medications and ability to use respiratory devices properly.;Stress  Weight Management/Obesity;Improve shortness of breath with ADL's;Develop more efficient breathing techniques such as purse lipped breathing and diaphragmatic breathing and practicing self-pacing with activity.;Increase knowledge of respiratory medications and ability to use respiratory devices properly.;Stress      Review  Pt has completed 5 exercise sessions since 11/27/17. This is tremendous for this pt who has severe depression and becomes very anxious over everything,  wife is very supportive and faithfully brings him to exercise.  Pt is working on learning PLB and diphragmatic breathing.  I anticipate with consistent coaching this will become very second nature to pt.  Pt shows slight weight gain of .1kg.  Pt has not yet started home exercise.  Pt receives his depression managment at [facility].  Pt feels comfortable with them and feels the medications are working for him.   I do anticipate with pt already consistent attendance, he will show measurable progress toward patient goals in the next 30 days assessment.  Pt has completed 8 exercise sessions since 11/27/17. This is tremendous for this pt who has severe depression and becomes very anxious over everything,  wife is very supportive and faithfully brings him to exercise. However pt did have a hospitalization for panic attacks and anxiety.  Pt hopes to return to exercies on Tuesday.  Pt is working on learning PLB and diphragmatic breathing.  I anticipate with consistent coaching this will become very second nature to pt.  Pt shows slight weight gain of .18kg  Pt has not yet started home exercise although he has had home exercise.  Pt receives his depression managment at [facility].  Pt feels comfortable with them and feels the medications are working for him.   I do anticipate with pt already consistent attendance, he will show measurable progress toward patient goals in the  next 30 days assessment.      Expected Outcomes  See Admission Goals/Outcomes  See Admission Goals/Outcomes         Core Components/Risk Factors/Patient Goals at Discharge (Final Review):  Goals and Risk Factor Review - 01/10/18 2229      Core Components/Risk Factors/Patient Goals Review   Personal Goals Review  Weight Management/Obesity;Improve shortness of breath with ADL's;Develop more efficient breathing techniques such as purse lipped breathing and diaphragmatic breathing and practicing self-pacing with activity.;Increase knowledge of respiratory medications and ability to use respiratory devices properly.;Stress    Review  Pt has completed 8 exercise sessions since 11/27/17. This is tremendous for this pt who has severe depression and becomes very anxious over everything,  wife is very supportive and faithfully brings him to exercise. However pt did have a hospitalization for panic attacks and anxiety.  Pt hopes to return to exercies on Tuesday.  Pt is working on learning PLB and diphragmatic breathing.  I anticipate with consistent coaching this will become very second nature to pt.  Pt shows slight weight gain of .18kg  Pt has not yet started home exercise although he has had home exercise.  Pt receives his depression managment at [facility].  Pt  feels comfortable with them and feels the medications are working for him.   I do anticipate with pt already consistent attendance, he will show measurable progress toward patient goals in the next 30 days assessment.    Expected Outcomes  See Admission Goals/Outcomes       ITP Comments: ITP Comments    Row Name 11/19/17 1354 12/12/17 1256 01/10/18 2224       ITP Comments  Dr. Manfred Arch, Medical Director  Dr. Manfred Arch, Medical Director  Dr. Manfred Arch, Medical Director Pulmonary Rehab        Comments: Pt has completed 8 exercise sessions.  Continue to monitor. Cherre Huger, BSN Cardiac and Training and development officer

## 2018-01-15 ENCOUNTER — Encounter (HOSPITAL_COMMUNITY): Admission: RE | Admit: 2018-01-15 | Payer: No Typology Code available for payment source | Source: Ambulatory Visit

## 2018-01-15 ENCOUNTER — Telehealth (HOSPITAL_COMMUNITY): Payer: Self-pay | Admitting: Internal Medicine

## 2018-01-17 ENCOUNTER — Encounter (HOSPITAL_COMMUNITY): Payer: No Typology Code available for payment source

## 2018-01-17 ENCOUNTER — Telehealth (HOSPITAL_COMMUNITY): Payer: Self-pay | Admitting: Internal Medicine

## 2018-01-22 ENCOUNTER — Encounter (HOSPITAL_COMMUNITY): Payer: Non-veteran care

## 2018-01-24 ENCOUNTER — Encounter (HOSPITAL_COMMUNITY): Payer: Non-veteran care

## 2018-01-29 ENCOUNTER — Encounter (HOSPITAL_COMMUNITY): Payer: Non-veteran care

## 2018-01-31 ENCOUNTER — Encounter (HOSPITAL_COMMUNITY): Payer: Non-veteran care

## 2018-02-05 ENCOUNTER — Encounter (HOSPITAL_COMMUNITY): Payer: Non-veteran care

## 2018-02-07 ENCOUNTER — Encounter (HOSPITAL_COMMUNITY): Payer: Non-veteran care

## 2018-02-12 ENCOUNTER — Encounter (HOSPITAL_COMMUNITY): Payer: Non-veteran care

## 2018-02-14 ENCOUNTER — Encounter (HOSPITAL_COMMUNITY): Payer: Non-veteran care

## 2018-02-19 ENCOUNTER — Encounter (HOSPITAL_COMMUNITY): Payer: Non-veteran care

## 2018-02-21 ENCOUNTER — Encounter (HOSPITAL_COMMUNITY): Payer: Non-veteran care

## 2018-02-26 ENCOUNTER — Encounter (HOSPITAL_COMMUNITY): Payer: Non-veteran care

## 2018-02-28 ENCOUNTER — Encounter (HOSPITAL_COMMUNITY): Payer: Non-veteran care

## 2018-03-01 NOTE — Addendum Note (Signed)
Encounter addended by: Ivonne Andrew, RD on: 03/01/2018 12:17 PM  Actions taken: Visit Navigator Flowsheet section accepted

## 2018-03-10 NOTE — Addendum Note (Signed)
Encounter addended by: Rowe Pavy, RN on: 03/10/2018 8:00 PM  Actions taken: Episode resolved, Flowsheet data copied forward, Visit Navigator Flowsheet section accepted, Sign clinical note

## 2018-03-10 NOTE — Progress Notes (Signed)
Discharge Progress Report  Patient Details  Name: Matthew Patrick MRN: [id number] Date of Birth: 05/15/1945 Referring Provider:     Pulmonary Rehab Walk Test from 11/20/2017 in [place]  Referring Provider  Dr. Nelda Marseille       Number of Visits: 8  Reason for Discharge:  Early Exit:  Personal  Smoking History:  Social History   Tobacco Use  Smoking Status Former Smoker  . Packs/day: 1.00  . Years: 5.00  . Pack years: 5.00  . Types: Cigarettes  . Last attempt to quit: 04/25/1967  . Years since quitting: 50.9  Smokeless Tobacco Never Used    Diagnosis:  COPD with asthma ([place])  ADL UCSD: Pulmonary Assessment Scores    Row Name 11/22/17 0721         ADL UCSD   ADL Phase  Entry       mMRC Score   mMRC Score  0        Initial Exercise Prescription: Initial Exercise Prescription - 11/22/17 0700      Date of Initial Exercise RX and Referring Provider   Date  11/20/17    Referring Provider  Dr. Nelda Marseille      Bike   Level  0.4    Minutes  17      NuStep   Level  2    SPM  80    Minutes  17    METs  1.5      Track   Laps  10    Minutes  17      Prescription Details   Frequency (times per week)  2    Duration  Progress to 45 minutes of aerobic exercise without signs/symptoms of physical distress      Intensity   THRR 40-80% of Max Heartrate  59-118    Ratings of Perceived Exertion  11-13    Perceived Dyspnea  0-4      Progression   Progression  Continue progressive overload as per policy without signs/symptoms or physical distress.      Resistance Training   Training Prescription  Yes    Weight  blue bands    Reps  10-15       Discharge Exercise Prescription (Final Exercise Prescription Changes): Exercise Prescription Changes - 12/27/17 1729      Response to Exercise   Blood Pressure (Admit)  100/50    Blood Pressure (Exercise)  120/62    Blood Pressure (Exit)  110/58    Heart Rate (Admit)  66 bpm    Heart Rate  (Exercise)  117 bpm    Heart Rate (Exit)  80 bpm    Oxygen Saturation (Admit)  95 %    Oxygen Saturation (Exercise)  96 %    Oxygen Saturation (Exit)  97 %    Rating of Perceived Exertion (Exercise)  12    Perceived Dyspnea (Exercise)  3    Duration  Progress to 45 minutes of aerobic exercise without signs/symptoms of physical distress    Intensity  THRR unchanged      Progression   Progression  Continue to progress workloads to maintain intensity without signs/symptoms of physical distress.      Resistance Training   Training Prescription  Yes    Weight  blue bands    Reps  10-15    Time  10 Minutes      Interval Training   Interval Training  No      Bike  Level  1    Minutes  17      NuStep   Level  4    SPM  80    Minutes  17    METs  2       Functional Capacity: 6 Minute Walk    Row Name 11/22/17 0719         6 Minute Walk   Phase  Initial     Distance  1250 feet     Walk Time  6 minutes     # of Rest Breaks  0     MPH  2.36     METS  2.84     RPE  11     Perceived Dyspnea   1     Symptoms  No     Resting HR  70 bpm     Resting BP  124/60     Resting Oxygen Saturation   94 %     Exercise Oxygen Saturation  during 6 min walk  93 %     Max Ex. HR  93 bpm     Max Ex. BP  144/60       Interval HR   1 Minute HR  69     2 Minute HR  82     3 Minute HR  93     4 Minute HR  83     5 Minute HR  82     6 Minute HR  79     2 Minute Post HR  70     Interval Heart Rate?  Yes       Interval Oxygen   Interval Oxygen?  Yes     Baseline Oxygen Saturation %  94 %     1 Minute Oxygen Saturation %  93 %     1 Minute Liters of Oxygen  0 L     2 Minute Oxygen Saturation %  94 %     2 Minute Liters of Oxygen  0 L     3 Minute Oxygen Saturation %  96 %     3 Minute Liters of Oxygen  0 L     4 Minute Oxygen Saturation %  94 %     4 Minute Liters of Oxygen  0 L     5 Minute Oxygen Saturation %  95 %     5 Minute Liters of Oxygen  0 L     6 Minute Oxygen  Saturation %  95 %     6 Minute Liters of Oxygen  0 L     2 Minute Post Oxygen Saturation %  95 %     2 Minute Post Liters of Oxygen  0 L        Psychological, QOL, Others - Outcomes: PHQ 2/9: Depression screen [facility] 2/9 11/19/2017 11/19/2017  Decreased Interest 3 3  Down, Depressed, Hopeless 3 3  PHQ - 2 Score 6 6  Altered sleeping 3 3  Tired, decreased energy 3 3  Change in appetite 0 0  Feeling bad or failure about yourself  3 3  Trouble concentrating 3 3  Moving slowly or fidgety/restless 3 3  Suicidal thoughts 0 0  PHQ-9 Score 21 21  Difficult doing work/chores Extremely dIfficult Very difficult    Quality of Life:   Personal Goals: Goals established at orientation with interventions provided to work toward goal. Personal Goals and Risk Factors at Admission - 11/19/17 1405  Core Components/Risk Factors/Patient Goals on Admission    Weight Management  Weight Maintenance;Weight Gain;Yes    Intervention  Weight Management: Develop a combined nutrition and exercise program designed to reach desired caloric intake, while maintaining appropriate intake of nutrient and fiber, sodium and fats, and appropriate energy expenditure required for the weight goal.;Weight Management: Provide education and appropriate resources to help participant work on and attain dietary goals.;Weight Management/Obesity: Establish reasonable short term and long term weight goals.    Admit Weight  152 lb 1.9 oz (69 kg)    Improve shortness of breath with ADL's  Yes    Intervention  Provide education, individualized exercise plan and daily activity instruction to help decrease symptoms of SOB with activities of daily living.    Expected Outcomes  Short Term: Improve cardiorespiratory fitness to achieve a reduction of symptoms when performing ADLs;Long Term: Be able to perform more ADLs without symptoms or delay the onset of symptoms    Stress  Yes    Intervention  Offer individual and/or small group  education and counseling on adjustment to heart disease, stress management and health-related lifestyle change. Teach and support self-help strategies.    Expected Outcomes  Short Term: Participant demonstrates changes in health-related behavior, relaxation and other stress management skills, ability to obtain effective social support, and compliance with psychotropic medications if prescribed.;Long Term: Emotional wellbeing is indicated by absence of clinically significant psychosocial distress or social isolation.        Personal Goals Discharge: Goals and Risk Factor Review    Row Name 12/12/17 1256 01/10/18 2229 03/10/18 1950         Core Components/Risk Factors/Patient Goals Review   Personal Goals Review  Weight Management/Obesity;Improve shortness of breath with ADL's;Develop more efficient breathing techniques such as purse lipped breathing and diaphragmatic breathing and practicing self-pacing with activity.;Increase knowledge of respiratory medications and ability to use respiratory devices properly.;Stress  Weight Management/Obesity;Improve shortness of breath with ADL's;Develop more efficient breathing techniques such as purse lipped breathing and diaphragmatic breathing and practicing self-pacing with activity.;Increase knowledge of respiratory medications and ability to use respiratory devices properly.;Stress  Weight Management/Obesity;Improve shortness of breath with ADL's;Develop more efficient breathing techniques such as purse lipped breathing and diaphragmatic breathing and practicing self-pacing with activity.;Increase knowledge of respiratory medications and ability to use respiratory devices properly.;Stress     Review  Pt has completed 5 exercise sessions since 11/27/17. This is tremendous for this pt who has severe depression and becomes very anxious over everything,  wife is very supportive and faithfully brings him to exercise.  Pt is working on learning PLB and diphragmatic  breathing.  I anticipate with consistent coaching this will become very second nature to pt.  Pt shows slight weight gain of .1kg.  Pt has not yet started home exercise.  Pt receives his depression managment at [facility].  Pt feels comfortable with them and feels the medications are working for him.   I do anticipate with pt already consistent attendance, he will show measurable progress toward patient goals in the next 30 days assessment.  Pt has completed 8 exercise sessions since 11/27/17. This is tremendous for this pt who has severe depression and becomes very anxious over everything,  wife is very supportive and faithfully brings him to exercise. However pt did have a hospitalization for panic attacks and anxiety.  Pt hopes to return to exercies on Tuesday.  Pt is working on learning PLB and diphragmatic breathing.  I anticipate with consistent coaching  this will become very second nature to pt.  Pt shows slight weight gain of .18kg  Pt has not yet started home exercise although he has had home exercise.  Pt receives his depression managment at [facility].  Pt feels comfortable with them and feels the medications are working for him.   I do anticipate with pt already consistent attendance, he will show measurable progress toward patient goals in the next 30 days assessment.  Pt discharged with the completion of 8 sessions.  Pt unable to continue participation due to behavioral health issues     Expected Outcomes  See Admission Goals/Outcomes  See Admission Goals/Outcomes  Pt did not met goals due to the short period of participation        Exercise Goals and Review: Exercise Goals    [place] Name 11/19/17 1416             Exercise Goals   Increase Physical Activity  Yes       Intervention  Provide advice, education, support and counseling about physical activity/exercise needs.;Develop an individualized exercise prescription for aerobic and resistive training based on initial evaluation  findings, risk stratification, comorbidities and participant's personal goals.       Expected Outcomes  Short Term: Attend rehab on a regular basis to increase amount of physical activity.;Long Term: Add in home exercise to make exercise part of routine and to increase amount of physical activity.;Long Term: Exercising regularly at least 3-5 days a week.       Increase Strength and Stamina  Yes       Intervention  Provide advice, education, support and counseling about physical activity/exercise needs.;Develop an individualized exercise prescription for aerobic and resistive training based on initial evaluation findings, risk stratification, comorbidities and participant's personal goals.       Expected Outcomes  Short Term: Increase workloads from initial exercise prescription for resistance, speed, and METs.;Short Term: Perform resistance training exercises routinely during rehab and add in resistance training at home;Long Term: Improve cardiorespiratory fitness, muscular endurance and strength as measured by increased METs and functional capacity (6MWT)       Able to understand and use rate of perceived exertion (RPE) scale  Yes       Intervention  Provide education and explanation on how to use RPE scale       Expected Outcomes  Short Term: Able to use RPE daily in rehab to express subjective intensity level;Long Term:  Able to use RPE to guide intensity level when exercising independently       Able to understand and use Dyspnea scale  Yes       Intervention  Provide education and explanation on how to use Dyspnea scale       Expected Outcomes  Short Term: Able to use Dyspnea scale daily in rehab to express subjective sense of shortness of breath during exertion;Long Term: Able to use Dyspnea scale to guide intensity level when exercising independently       Knowledge and understanding of Target Heart Rate Range (THRR)  Yes       Intervention  Provide education and explanation of THRR including how  the numbers were predicted and where they are located for reference       Expected Outcomes  Short Term: Able to state/look up THRR;Long Term: Able to use THRR to govern intensity when exercising independently;Short Term: Able to use daily as guideline for intensity in rehab       Understanding of Exercise  Prescription  Yes       Intervention  Provide education, explanation, and written materials on patient's individual exercise prescription       Expected Outcomes  Short Term: Able to explain program exercise prescription;Long Term: Able to explain home exercise prescription to exercise independently          Exercise Goals Re-Evaluation: Exercise Goals Re-Evaluation    Row Name 12/10/17 1634 01/08/18 0731           Exercise Goal Re-Evaluation   Exercise Goals Review  Understanding of Exercise Prescription;Knowledge and understanding of Target Heart Rate Range (THRR);Able to understand and use Dyspnea scale;Able to understand and use rate of perceived exertion (RPE) scale;Increase Strength and Stamina;Increase Physical Activity  Understanding of Exercise Prescription;Knowledge and understanding of Target Heart Rate Range (THRR);Able to understand and use Dyspnea scale;Able to understand and use rate of perceived exertion (RPE) scale;Increase Strength and Stamina;Increase Physical Activity      Comments  The patient has only attended 4 rehab sessions. Will cont. to monitor and progress.   Patient is progressing well. Patient has increased average METs from 1.4 to 2.1. Patient is able to walk 16 laps (200 ft each) in 15 minutes. Is open to workload changes. Will monitor and motivate.      Expected Outcomes  Through exercise at rehab and at home, patient will increase physical capacity and ADL's will be easier to perform. The patient will also feel comfortable establishing a home exercise program.  Through exercise at rehab and at home, patient will increase physical capacity and ADL's will be easier  to perform. The patient will also feel comfortable establishing a home exercise program.         Nutrition & Weight - Outcomes:    Nutrition: Nutrition Therapy & Goals - 03/01/18 1216      Nutrition Therapy   Diet  general healthful      Personal Nutrition Goals   Nutrition Goal  pt to identify and limit food sources of sodium      Intervention Plan   Intervention  Prescribe, educate and counsel regarding individualized specific dietary modifications aiming towards targeted core components such as weight, hypertension, lipid management, diabetes, heart failure and other comorbidities.    Expected Outcomes  Short Term Goal: Understand basic principles of dietary content, such as calories, fat, sodium, cholesterol and nutrients.       Nutrition Discharge: Nutrition Assessments - 03/01/18 1217      Rate Your Plate Scores   Pre Score  --   pt did not return pre survey   Post Score  --   pt did not return post survey      Education Questionnaire Score:   Cherre Huger, BSN Cardiac and Training and development officer

## 2018-04-29 ENCOUNTER — Other Ambulatory Visit: Payer: Self-pay

## 2018-04-29 ENCOUNTER — Emergency Department (HOSPITAL_COMMUNITY)
Admission: EM | Admit: 2018-04-29 | Discharge: 2018-04-29 | Disposition: A | Discharge status: Home / Self Care | Payer: No Typology Code available for payment source | Attending: Emergency Medicine | Admitting: Emergency Medicine

## 2018-04-29 ENCOUNTER — Emergency Department (HOSPITAL_COMMUNITY): Payer: No Typology Code available for payment source

## 2018-04-29 ENCOUNTER — Encounter (HOSPITAL_COMMUNITY): Payer: Self-pay | Admitting: Emergency Medicine

## 2018-04-29 DIAGNOSIS — Z7982 Long term (current) use of aspirin: Secondary | ICD-10-CM | POA: Insufficient documentation

## 2018-04-29 DIAGNOSIS — I10 Essential (primary) hypertension: Secondary | ICD-10-CM | POA: Diagnosis not present

## 2018-04-29 DIAGNOSIS — R42 Dizziness and giddiness: Secondary | ICD-10-CM | POA: Insufficient documentation

## 2018-04-29 DIAGNOSIS — Z79899 Other long term (current) drug therapy: Secondary | ICD-10-CM | POA: Diagnosis not present

## 2018-04-29 DIAGNOSIS — Z87891 Personal history of nicotine dependence: Secondary | ICD-10-CM | POA: Insufficient documentation

## 2018-04-29 DIAGNOSIS — R51 Headache: Secondary | ICD-10-CM | POA: Diagnosis not present

## 2018-04-29 DIAGNOSIS — J45909 Unspecified asthma, uncomplicated: Secondary | ICD-10-CM | POA: Diagnosis not present

## 2018-04-29 LAB — URINALYSIS, ROUTINE W REFLEX MICROSCOPIC
BILIRUBIN URINE: NEGATIVE
Bacteria, UA: NONE SEEN
Glucose, UA: NEGATIVE mg/dL
Ketones, ur: 5 mg/dL — AB
Leukocytes, UA: NEGATIVE
Nitrite: NEGATIVE
Protein, ur: NEGATIVE mg/dL
SPECIFIC GRAVITY, URINE: 1.012 (ref 1.005–1.030)
pH: 6 (ref 5.0–8.0)

## 2018-04-29 LAB — BASIC METABOLIC PANEL
Anion gap: 10 (ref 5–15)
BUN: 10 mg/dL (ref 8–23)
CHLORIDE: 98 mmol/L (ref 98–111)
CO2: 27 mmol/L (ref 22–32)
Calcium: 9.5 mg/dL (ref 8.9–10.3)
Creatinine, Ser: 0.93 mg/dL (ref 0.61–1.24)
GFR calc Af Amer: >60 mL/min (ref >60)
GFR calc non Af Amer: >60 mL/min (ref >60)
Glucose, Bld: 109 mg/dL — ABNORMAL HIGH (ref 70–99)
POTASSIUM: 3.6 mmol/L (ref 3.5–5.1)
SODIUM: 135 mmol/L (ref 135–145)

## 2018-04-29 LAB — CBC
HEMATOCRIT: 41 % (ref 39.0–52.0)
Hemoglobin: 13.8 g/dL (ref 13.0–17.0)
MCH: 32.4 pg (ref 26.0–34.0)
MCHC: 33.7 g/dL (ref 30.0–36.0)
MCV: 96.2 fL (ref 80.0–100.0)
Platelets: 206 10*3/uL (ref 150–400)
RBC: 4.26 MIL/uL (ref 4.22–5.81)
RDW: 11.7 % (ref 11.5–15.5)
WBC: 5 10*3/uL (ref 4.0–10.5)
nRBC: 0 % (ref 0.0–0.2)

## 2018-04-29 LAB — I-STAT TROPONIN, ED: Troponin i, poc: 0.01 ng/mL (ref 0.00–0.08)

## 2018-04-29 LAB — HEPATIC FUNCTION PANEL
ALK PHOS: 59 U/L (ref 38–126)
ALT: 18 U/L (ref 0–44)
AST: 22 U/L (ref 15–41)
Albumin: 4 g/dL (ref 3.5–5.0)
Bilirubin, Direct: 0.1 mg/dL (ref 0.0–0.2)
Indirect Bilirubin: 0.2 mg/dL — ABNORMAL LOW (ref 0.3–0.9)
Total Bilirubin: 0.3 mg/dL (ref 0.3–1.2)
Total Protein: 6.7 g/dL (ref 6.5–8.1)

## 2018-04-29 LAB — CBG MONITORING, ED: Glucose-Capillary: 79 mg/dL (ref 70–99)

## 2018-04-29 MED ORDER — KETOROLAC TROMETHAMINE 15 MG/ML IJ SOLN
15.0000 mg | Freq: Once | INTRAMUSCULAR | Status: AC
  Administered 2018-04-29: 15 mg via INTRAVENOUS
  Filled 2018-04-29: qty 1

## 2018-04-29 MED ORDER — METOCLOPRAMIDE HCL 5 MG/ML IJ SOLN
10.0000 mg | Freq: Once | INTRAMUSCULAR | Status: AC
  Administered 2018-04-29: 10 mg via INTRAVENOUS
  Filled 2018-04-29: qty 2

## 2018-04-29 MED ORDER — SODIUM CHLORIDE 0.9 % IV BOLUS
1000.0000 mL | Freq: Once | INTRAVENOUS | Status: AC
  Administered 2018-04-29: 1000 mL via INTRAVENOUS

## 2018-04-29 MED ORDER — ONDANSETRON HCL 4 MG/2ML IJ SOLN
4.0000 mg | Freq: Once | INTRAMUSCULAR | Status: AC
  Administered 2018-04-29: 4 mg via INTRAVENOUS
  Filled 2018-04-29: qty 2

## 2018-04-29 MED ORDER — DIPHENHYDRAMINE HCL 50 MG/ML IJ SOLN
25.0000 mg | Freq: Once | INTRAMUSCULAR | Status: AC
  Administered 2018-04-29: 25 mg via INTRAVENOUS
  Filled 2018-04-29: qty 1

## 2018-04-29 NOTE — ED Triage Notes (Signed)
Pt wife pt dizziness onset yesterday; some associated nausea. Orthostatic blood pressure and blood sugar normal yesterday at [facility].

## 2018-04-29 NOTE — ED Provider Notes (Signed)
[facility] DEPT Provider Note   CSN: [id number] Arrival date & time: 04/29/18  1014     History   Chief Complaint Chief Complaint  Patient presents with  . Dizziness    HPI Matthew Patrick is a 73 y.o. male.  73 y.o male with a PMH of HTN, pression, SIADH presents to the ED brought in by wife with a chief complaint of dizziness since yesterday.  Reports he was watching TV yesterday when he suddenly began to feel dizzy.  Reports taking him to a [facility] where she was told "his EKG shows regularities in his heart ", she was instructed to go to the ED for further evaluation however she reports she was unable to bring him yesterday for evaluation.  Wife attempted to call PCP this morning but reports they were instructed to seek evaluation in the emergency department.  He reports feeling dizzy this morning, woke up and immediately went back to bed.  No medication has been taken for relieving symptoms. Wife at the bedside reports slight nausea but no episodes of vomiting,no fever, shortness of breath or other complaints.  Does have a history of depressive episode along with Parkinson's and dementia but wife states his behavior has been consistent with his baseline aside from him indicating that he had some dizziness.     Past Medical History:  Diagnosis Date  . Anxiety   . Asthma   . Depression   . Hyperlipemia   . Hypertension   . Sleep apnea    has c-pap     Patient Active Problem List   Diagnosis Date Noted  . Atypical chest pain 09/05/2017  . Generalized weakness 09/22/2015  . Depression 09/22/2015  . Anemia 09/22/2015  . Obsessive compulsive disorder 09/22/2015  . Generalized anxiety disorder 09/22/2015  . SIADH (syndrome of inappropriate ADH production) ([place]) 09/21/2015  . Hyponatremia 09/20/2015  . Severe recurrent major depression with psychotic features ([place]) 09/19/2015  . Major depressive disorder, recurrent severe without  psychotic features ([place]) 09/17/2015  . Suicidal ideation 09/17/2015  . Cough 01/19/2015  . Restless leg 01/19/2015  . COLD (chronic obstructive lung disease) ([place]) 05/22/2014  . Lung nodule < 6cm on CT 05/22/2014  . OSA (obstructive sleep apnea) 07/11/2013  . Lung nodule 05/11/2013  . Nocturnal hypoxemia 05/11/2013  . Obstructive lung disease ([place]) 04/17/2013  . Chronic cough 04/17/2013  . Syncope 03/25/2011  . Hypertension 03/25/2011  . Hypokalemia 03/25/2011  . Asthma 03/25/2011  . Sciatica 03/25/2011  . Shortness of breath 03/25/2011    Past Surgical History:  Procedure Laterality Date  . 2D Echocardiogram  03/27/11  . CATARACT EXTRACTION    . Neck skin tag biopsy    . TONSILLECTOMY AND ADENOIDECTOMY          Home Medications    Prior to Admission medications   Medication Sig Start Date End Date Taking? Authorizing Provider  Acetaminophen (TYLENOL PO) Take 2 tablets by mouth daily as needed (pain).   Yes [provider]  albuterol (PROVENTIL HFA;VENTOLIN HFA) 108 (90 Base) MCG/ACT inhaler Inhale into the lungs.   Yes [provider]  amLODipine (NORVASC) 5 MG tablet Take 5 mg by mouth every morning.   Yes [provider]  aspirin 81 MG tablet Take 81 mg by mouth daily.   Yes [provider]  B Complex-C (B-COMPLEX WITH VITAMIN C) tablet Take by mouth.   Yes [provider]  busPIRone (BUSPAR) 15 MG tablet Take  15 mg by mouth 3 (three) times daily.   Yes [provider]  clonazePAM (KLONOPIN) 1 MG tablet Take 1 mg by mouth 2 (two) times daily.   Yes [provider]  escitalopram (LEXAPRO) 10 MG tablet Take 10 mg by mouth daily.   Yes [provider]  fluticasone furoate-vilanterol (BREO ELLIPTA) 100-25 MCG/INH AEPB Inhale 1 puff into the lungs daily. 08/21/17  Yes Brand Males, MD  hydrOXYzine (ATARAX/VISTARIL) 25 MG tablet Take 25 mg by mouth daily as needed for anxiety.   Yes [provider]  loratadine (CLARITIN) 10 MG tablet Take 10 mg by mouth every morning.   Yes [provider]  Melatonin 10 MG TABS Take 20 mg by mouth at bedtime.    Yes [provider]  memantine (NAMENDA) 10 MG tablet Take 20 mg by mouth 2 (two) times daily.   Yes [provider]  Misc Natural Products (PROSTATE HEALTH) CAPS Take 1 capsule by mouth daily.   Yes [provider]  Multiple Vitamins-Minerals (EYE HEALTH PO) Take 1 tablet by mouth daily.   Yes [provider]  OLANZapine (ZYPREXA) 20 MG tablet Take 10 mg by mouth at bedtime.   Yes [provider]  OVER THE COUNTER MEDICATION Take 1 tablet by mouth daily. Prostovan   Yes [provider]  Polyethyl Glycol-Propyl Glycol (SYSTANE) 0.4-0.3 % GEL ophthalmic gel Place 1 drop into both eyes 2 (two) times daily.   Yes [provider]  pramipexole (MIRAPEX) 0.125 MG tablet Take 0.125 mg by mouth 4 (four) times daily. Marland Kitchen125 mg three times daily and at bedtime   Yes [provider]  ropinirole (REQUIP) 5 MG tablet Take 1 tablet by mouth 3 (three) times daily. 5 mg twice daily and 10 mg at bedtime   Yes [provider]  tamsulosin (FLOMAX) 0.4 MG CAPS capsule Take 0.8 mg by mouth at bedtime.    Yes [provider]  thiamine 100 MG tablet Take 100 mg by mouth daily.   Yes [provider]  Tiotropium Bromide Monohydrate (SPIRIVA RESPIMAT) 2.5 MCG/ACT AERS Inhale 2 puffs into the lungs daily.   Yes [provider]  valproic acid (DEPAKENE) 250 MG capsule Take 750 mg by mouth at bedtime.   Yes [provider]  vitamin B-12 (CYANOCOBALAMIN) 500 MCG tablet Take 500 mcg by mouth 2 (two) times daily.   Yes [provider]  LORazepam (ATIVAN) 0.5 MG tablet Take 0.5 tablets (0.25 mg total) by mouth 4 (four) times daily. Patient not taking: Reported on 04/29/2018 09/22/15   Theodoro Grist, MD    Family History Family History    Problem Relation Age of Onset  . Depression Brother   . Depression Sister   . Heart disease Father   . Anxiety disorder Father   . Depression Father   . Emphysema Mother   . Allergies Mother     Social History Social History   Tobacco Use  . Smoking status: Former Smoker    Packs/day: 1.00    Years: 5.00    Pack years: 5.00    Types: Cigarettes    Last attempt to quit: 04/25/1967    Years since quitting: 51.0  . Smokeless tobacco: Never Used  Substance Use Topics  . Alcohol use: No  . Drug use: No     Allergies   Patient has no known allergies.   Review of Systems Review of Systems  Constitutional: Negative for chills and fever.  HENT: Negative for ear pain and sore throat.   Eyes: Negative for pain and visual disturbance.  Respiratory: Negative for cough and shortness of breath.   Cardiovascular: Negative for chest pain and palpitations.  Gastrointestinal: Negative for abdominal pain and vomiting.  Genitourinary: Negative for dysuria and hematuria.  Musculoskeletal: Negative for arthralgias and back pain.  Skin: Negative for color change and rash.  Neurological: Positive for dizziness and headaches. Negative for seizures, syncope, light-headedness and numbness.  All other systems reviewed and are negative.    Physical Exam Updated Vital Signs BP 121/62   Pulse (!) 50   Temp 98.2 F (36.8 C) (Oral)   Resp (!) 8   Ht 5\' 10"  (1.778 m)   Wt 68 kg   SpO2 100%   BMI 21.52 kg/m   Physical Exam Vitals signs and nursing note reviewed.  Constitutional:      Appearance: Normal appearance. He is well-developed.  HENT:     Head: Normocephalic and atraumatic.  Eyes:     General: No scleral icterus.    Pupils: Pupils are equal, round, and reactive to light.  Neck:     Musculoskeletal: Normal range of motion.  Cardiovascular:     Heart sounds: Normal heart sounds.  Pulmonary:     Effort: Pulmonary effort is normal.     Breath sounds: Normal breath sounds.  No wheezing.  Chest:     Chest wall: No tenderness.  Abdominal:     General: Bowel sounds are normal. There is no distension.     Palpations: Abdomen is soft.     Tenderness: There is no abdominal tenderness.  Musculoskeletal:        General: No tenderness or deformity.  Skin:    General: Skin is warm and dry.  Neurological:     Mental Status: He is alert. Mental status is at baseline.      ED Treatments / Results  Labs (all labs ordered are listed, but only abnormal results are displayed) Labs Reviewed  BASIC METABOLIC PANEL - Abnormal; Notable for the following components:      Result Value   Glucose, Bld 109 (*)    All other components within normal limits  URINALYSIS, ROUTINE W REFLEX MICROSCOPIC - Abnormal; Notable for the following components:   Hgb urine dipstick SMALL (*)    Ketones, ur 5 (*)    All other components within normal limits  HEPATIC FUNCTION PANEL - Abnormal; Notable for the following components:   Indirect Bilirubin 0.2 (*)    All other components within normal limits  CBC  CBG MONITORING, ED  I-STAT TROPONIN, ED    EKG EKG Interpretation  Date/Time:  Monday April 29 2018 10:27:50 EST Ventricular Rate:  58 PR Interval:    QRS Duration: 84 QT Interval:  435 QTC Calculation: 428 R Axis:   55 Text Interpretation:  Sinus rhythm Borderline repolarization abnormality upsloping st segments, in the inferior leads present before but more pronounced Otherwise no significant change Confirmed by Deno Etienne [phone]) on 04/29/2018 11:44:05 AM   Radiology Dg Chest 2 View  Result Date: 04/29/2018 CLINICAL DATA:  Dizziness and nausea beginning yesterday. EXAM: CHEST - 2 VIEW COMPARISON:  08/04/2017 FINDINGS: Artifact overlies the chest. Heart size is normal. Mediastinal shadows are normal. There is patchy atelectasis and or infiltrate in both lower lobes. The upper lungs are clear. Small effusions in the posterior costophrenic angles. No acute bone finding.  IMPRESSION: Patchy atelectasis and/or infiltrate in both lower lobes. Small  effusions. Electronically Signed   By: Nelson Chimes M.D.   On: 04/29/2018 11:28    Procedures Procedures (including critical care time)  Medications Ordered in ED Medications  ondansetron (ZOFRAN) injection 4 mg (4 mg Intravenous Given 04/29/18 1103)  sodium chloride 0.9 % bolus 1,000 mL (0 mLs Intravenous Stopped 04/29/18 1348)  metoCLOPramide (REGLAN) injection 10 mg (10 mg Intravenous Given 04/29/18 1252)  diphenhydrAMINE (BENADRYL) injection 25 mg (25 mg Intravenous Given 04/29/18 1252)  ketorolac (TORADOL) 15 MG/ML injection 15 mg (15 mg Intravenous Given 04/29/18 1253)     Initial Impression / Assessment and Plan / ED Course  I have reviewed the triage vital signs and the nursing notes.  Pertinent labs & imaging results that were available during my care of the patient were reviewed by me and considered in my medical decision making (see chart for details).    With a previous history of Parkinson along with dementia presents with dizziness which began yesterday.  [facility] and advised to return to the ED for further evaluation, wife reports he was unable to bring him yesterday.BMP showed no electrolyte abnormality, hepatic function was within normal limits. CBC showed no leukocytosis, UA showed small hgb no bacteria or leukocytes, denies urinary symptoms.  CBG was 79 and first troponin was negative.  EKG showed no changes consistent with infarct or STEMI. Orthostatic vitals obtained.  Patient received 1L bolus along with zofran and a headache cocktail. I have discussed this patient with Dr. Tyrone Nine who has seen patient. Patient's wife at the bedside advised that will need to obtain an MRI of his Head to r/o any infarct, however she reports "I am not sure if my insurance will cover it". At this time I have explained to wife that I am unable to further evaluate the dizziness as a CT would not  provide enough information to rule it out, will need to obtain MRI.   Share decision making conversation with wife who reports she will schedule the MRI outpatient at this time. Husband reports he is feeling better (told this to wife) but when asked by me he reports slight improvement. Due to patient's medical history unable to confirm baseline, he was ambulatory in the ED. Will have wife schedule MRI outpatient and return to the ED if symptoms worsen.   Final Clinical Impressions(s) / ED Diagnoses   Final diagnoses:  Dizziness    ED Discharge Orders    None       Janeece Fitting, PA-C 04/29/18 [place], DO 04/29/18 1435

## 2018-04-29 NOTE — Discharge Instructions (Addendum)
Your laboratory results along with your chest xray were within normal limits today. We were unable to rule out any stroke or brain infarct as the MRI was not obtained. Please follow up with your PCP at your earliest convenience.

## 2018-10-01 DIAGNOSIS — L570 Actinic keratosis: Secondary | ICD-10-CM | POA: Diagnosis not present

## 2018-10-01 DIAGNOSIS — D485 Neoplasm of uncertain behavior of skin: Secondary | ICD-10-CM | POA: Diagnosis not present

## 2018-10-01 DIAGNOSIS — D3617 Benign neoplasm of peripheral nerves and autonomic nervous system of trunk, unspecified: Secondary | ICD-10-CM | POA: Diagnosis not present

## 2018-10-02 DIAGNOSIS — Z029 Encounter for administrative examinations, unspecified: Secondary | ICD-10-CM | POA: Diagnosis not present

## 2018-12-12 DIAGNOSIS — Z23 Encounter for immunization: Secondary | ICD-10-CM | POA: Diagnosis not present

## 2021-06-07 ENCOUNTER — Encounter (HOSPITAL_COMMUNITY): Payer: Self-pay | Admitting: Emergency Medicine

## 2021-06-07 ENCOUNTER — Emergency Department (HOSPITAL_COMMUNITY)
Admission: EM | Admit: 2021-06-07 | Discharge: 2021-06-07 | Disposition: A | Discharge status: Home / Self Care | Payer: No Typology Code available for payment source | Attending: Emergency Medicine | Admitting: Emergency Medicine

## 2021-06-07 DIAGNOSIS — R531 Weakness: Secondary | ICD-10-CM | POA: Diagnosis not present

## 2021-06-07 DIAGNOSIS — Z5321 Procedure and treatment not carried out due to patient leaving prior to being seen by health care provider: Secondary | ICD-10-CM | POA: Diagnosis not present

## 2021-06-07 DIAGNOSIS — R059 Cough, unspecified: Secondary | ICD-10-CM | POA: Insufficient documentation

## 2021-06-07 LAB — BASIC METABOLIC PANEL
Anion gap: 9 (ref 5–15)
BUN: 18 mg/dL (ref 8–23)
CO2: 26 mmol/L (ref 22–32)
Calcium: 8.9 mg/dL (ref 8.9–10.3)
Chloride: 91 mmol/L — ABNORMAL LOW (ref 98–111)
Creatinine, Ser: 0.94 mg/dL (ref 0.61–1.24)
GFR, Estimated: >60 mL/min (ref >60)
Glucose, Bld: 96 mg/dL (ref 70–99)
Potassium: 4 mmol/L (ref 3.5–5.1)
Sodium: 126 mmol/L — ABNORMAL LOW (ref 135–145)

## 2021-06-07 LAB — CBC WITH DIFFERENTIAL/PLATELET
Abs Immature Granulocytes: 0.19 10*3/uL — ABNORMAL HIGH (ref 0.00–0.07)
Basophils Absolute: 0.1 10*3/uL (ref 0.0–0.1)
Basophils Relative: 1 %
Eosinophils Absolute: 0.1 10*3/uL (ref 0.0–0.5)
Eosinophils Relative: 1 %
HCT: 39.1 % (ref 39.0–52.0)
Hemoglobin: 13.7 g/dL (ref 13.0–17.0)
Immature Granulocytes: 2 %
Lymphocytes Relative: 9 %
Lymphs Abs: 0.9 10*3/uL (ref 0.7–4.0)
MCH: 33.2 pg (ref 26.0–34.0)
MCHC: 35 g/dL (ref 30.0–36.0)
MCV: 94.7 fL (ref 80.0–100.0)
Monocytes Absolute: 1.4 10*3/uL — ABNORMAL HIGH (ref 0.1–1.0)
Monocytes Relative: 15 %
Neutro Abs: 7.1 10*3/uL (ref 1.7–7.7)
Neutrophils Relative %: 72 %
Platelets: 230 10*3/uL (ref 150–400)
RBC: 4.13 MIL/uL — ABNORMAL LOW (ref 4.22–5.81)
RDW: 11.6 % (ref 11.5–15.5)
WBC: 9.7 10*3/uL (ref 4.0–10.5)
nRBC: 0 % (ref 0.0–0.2)

## 2021-06-07 LAB — CBG MONITORING, ED: Glucose-Capillary: 89 mg/dL (ref 70–99)

## 2021-06-07 NOTE — ED Triage Notes (Addendum)
Per EMS-patient coming from home-complaining of cough for 2 days-weakness for about a week-progressively getting worse-was seen at [facility] yesterday-had CXR done-sent home with amoxicillin

## 2021-06-07 NOTE — ED Provider Triage Note (Signed)
Emergency Medicine Provider Triage Evaluation Note  Matthew Patrick , a 76 y.o. male  was evaluated in triage.  Pt complains of cough x2 days.  Patient has associated generalized weakness.  Patient was evaluated at the [facility] yesterday and started on amoxicillin. Denies chest pain, shortness of breath, fever, chills, abdominal pain, nausea, vomiting.  Per patient chart review: Patient was evaluated at the [facility] on yesterday, 06/06/2021 and at that time had a negative chest x-ray, negative COVID, flu swabs.  No labs were obtained during the visit.  Patient was sent a prescription for amoxicillin.  Review of Systems  Positive: As per HPI above Negative: Chest pain, shortness of breath  Physical Exam  BP (!) 160/86 (BP Location: Right Arm)    Pulse 88    Temp 98.1 F (36.7 C) (Oral)    Resp 16    SpO2 99%  Gen:   Awake, no distress   Resp:  Normal effort  MSK:   Moves extremities without difficulty  Other:  No chest wall tenderness to palpation  Medical Decision Making  Medically screening exam initiated at 11:19 AM.  Appropriate orders placed.  [place] Souter was informed that the remainder of the evaluation will be completed by another provider, this initial triage assessment does not replace that evaluation, and the importance of remaining in the ED until their evaluation is complete.    Arrow Emmerich A, PA-C 06/07/21 1121

## 2021-06-07 NOTE — ED Notes (Signed)
Pt told registration staff that he was leaving.

## 2023-06-20 ENCOUNTER — Telehealth (HOSPITAL_COMMUNITY): Payer: Self-pay

## 2023-06-20 NOTE — Telephone Encounter (Signed)
 Received referral from Dr. Charline Bills from the [facility] for this pt to participate in Pulmonary Rehab with the diagnosis of COPD 2. Clinical review of pt follow up appt on 05/30/2023 Pulmonary office note. Pt appropriate for scheduling for Pulmonary rehab.   Essie Hart Cardiac and Pulmonary Rehab

## 2023-06-20 NOTE — Telephone Encounter (Signed)
 LVM for patient to schedule Pulmonary Rehab Orientation. Awaiting call back

## 2023-06-25 ENCOUNTER — Encounter (HOSPITAL_COMMUNITY): Payer: Self-pay

## 2023-06-25 NOTE — Progress Notes (Signed)
 Attempted to schedule pt for Pulmonary rehab orientation. No answer. VM left.

## 2023-06-28 ENCOUNTER — Telehealth (HOSPITAL_COMMUNITY): Payer: Self-pay

## 2023-06-28 NOTE — Telephone Encounter (Signed)
 Received referral from Dr. Carlisle Cater from the [facility] for this pt to participate in Pulmonary Rehab with the diagnosis of COPD 2. Clinical review of pt follow up appt on 05/30/2023 Pulmonary office note. Pt appropriate for scheduling for Pulmonary rehab.   LVM for pt to set up appointment. Awaiting call back.   Essie Hart Cardiac and Pulmonary Rehab

## 2023-06-28 NOTE — Telephone Encounter (Signed)
 Attempted to return pt phone call, LMTCB.

## 2023-06-29 ENCOUNTER — Encounter (HOSPITAL_COMMUNITY): Payer: Self-pay

## 2023-07-02 ENCOUNTER — Telehealth (HOSPITAL_COMMUNITY): Payer: Self-pay | Admitting: *Deleted

## 2023-07-02 NOTE — Telephone Encounter (Signed)
 Called pt again to discuss Pulmonary Rehab. LVM and sent letter. Will await return call.  Ethelda Chick BS, ACSM-CEP 07/02/2023 11:42 AM

## 2023-07-09 ENCOUNTER — Other Ambulatory Visit (HOSPITAL_COMMUNITY): Payer: Self-pay

## 2023-07-13 ENCOUNTER — Ambulatory Visit (HOSPITAL_COMMUNITY)

## 2023-07-17 ENCOUNTER — Telehealth (HOSPITAL_COMMUNITY): Payer: Self-pay

## 2023-07-17 NOTE — Telephone Encounter (Signed)
 Called pt to confirm his Pulmonary rehab appointment on 07/18/23. No answer. LVM.

## 2023-07-18 ENCOUNTER — Encounter (HOSPITAL_COMMUNITY)
Admission: RE | Admit: 2023-07-18 | Discharge: 2023-07-18 | Disposition: A | Discharge status: Home / Self Care | Source: Ambulatory Visit | Attending: Pulmonary Disease | Admitting: Pulmonary Disease

## 2023-07-18 ENCOUNTER — Encounter (HOSPITAL_COMMUNITY): Payer: Self-pay

## 2023-07-18 VITALS — BP 122/60 | HR 75 | Ht 69.5 in | Wt 187.6 lb

## 2023-07-18 DIAGNOSIS — M545 Low back pain, unspecified: Secondary | ICD-10-CM | POA: Diagnosis present

## 2023-07-18 DIAGNOSIS — J449 Chronic obstructive pulmonary disease, unspecified: Secondary | ICD-10-CM | POA: Diagnosis present

## 2023-07-18 HISTORY — DX: Allergic rhinitis, unspecified: J30.9

## 2023-07-18 HISTORY — DX: Functional diarrhea: K59.1

## 2023-07-18 HISTORY — DX: Major depressive disorder, single episode, unspecified: F32.9

## 2023-07-18 HISTORY — DX: Vascular dementia, unspecified severity, without behavioral disturbance, psychotic disturbance, mood disturbance, and anxiety: F01.50

## 2023-07-18 HISTORY — DX: Bipolar disorder, current episode mixed, severe, with psychotic features: F31.64

## 2023-07-18 HISTORY — DX: Other symptoms and signs involving cognitive functions and awareness: R41.89

## 2023-07-18 HISTORY — DX: Parkinson's disease without dyskinesia, without mention of fluctuations: G20.A1

## 2023-07-18 HISTORY — DX: Other nonspecific abnormal finding of lung field: R91.8

## 2023-07-18 HISTORY — DX: Actinic keratosis: L57.0

## 2023-07-18 HISTORY — DX: Contact with and (suspected) exposure to other hazardous substances: Z77.29

## 2023-07-18 HISTORY — DX: Insomnia, unspecified: G47.00

## 2023-07-18 HISTORY — DX: Drug induced akathisia: G25.71

## 2023-07-18 HISTORY — DX: Contact with and (suspected) exposure to other hazardous, chiefly nonmedicinal, chemicals: Z77.098

## 2023-07-18 HISTORY — DX: Disorder of central nervous system, unspecified: G96.9

## 2023-07-18 HISTORY — DX: Dysuria: R30.0

## 2023-07-18 HISTORY — DX: Benign prostatic hyperplasia without lower urinary tract symptoms: N40.0

## 2023-07-18 HISTORY — DX: Restless legs syndrome: G25.81

## 2023-07-18 NOTE — Progress Notes (Signed)
 Matthew Patrick 78 y.o. male  Initial Psychosocial Assessment  Psychosocial assessment reveals patient lives with spouse. Matthew Patrick is currently retired. Patient hobbies include watching tv, spending time with others, and reading. Patient reports his stress level is low. Areas of stress/anxiety include health. Patient does exhibit signs of depression. Signs of depression include anxiety, helplessness, and sadness and fatigue. PHQ2/9 score 2/4. Matthew Patrick shows good  coping skills with positive outlook on life. Matthew Patrick states he regularly sees a therapist at the [place] who comes to his house. Offered emotional support and reassurance. Will continue to monitor and evaluate progress toward psychosocial goal(s) of decreased symptoms of depression, anxiety, and health related stress. We will monitor and evaluate progress toward psychosocial goal(s).  Goal(s): Improved management of stress, anxiety, and depression Improved coping skills Help patient work toward returning to meaningful activities that improve patient's QOL and are attainable with patient's lung disease   07/18/2023 1:52 PM

## 2023-07-18 NOTE — Progress Notes (Signed)
 Birdie Riddle 78 y.o. male  Pulmonary Rehab Orientation Note  This patient who was [facility] with [facility] COPD 2 arrived today in Cardiac and Pulmonary Rehab. He arrived ambulatory with normal gait. He does not carry portable oxygen. Per patient, Orson uses oxygen never. Color good, skin warm and dry. Patient is oriented to time and place but does stated he has "memory problems". Patient's medical history, psychosocial health, and medications reviewed.   Psychosocial assessment reveals patient lives with spouse. Jailin is currently retired. Patient hobbies include watching tv, spending time with others, and reading. Patient reports his stress level is low. Areas of stress/anxiety include health. Patient does exhibit signs of depression. Signs of depression include anxiety, helplessness, and sadness and fatigue. PHQ2/9 score 2/4. Shahiem shows good  coping skills with positive outlook on life. Shahin states he regularly sees a therapist at the [place] who comes to his house. Offered emotional support and reassurance. Will continue to monitor and evaluate progress toward psychosocial goal(s) of decreased symptoms of depression, anxiety, and health related stress.   Physical Assessment reveals: Well appearing, A&Ox4, states he does have memory problems Eyes/Ears: wears corrective glasses Lungs: Clear with no wheezes, rales, rhonchi, denies chronic cough, dyspnea on exertion Heart: Regular rate rhythm, no murmurs, no rubs, no clicks Gastrointestinal: abdomin soft, + bowel sounds in all 4 quads, states his appetite has increased and states he has been gaining weight, endorses normal BMs Genitourinary: WNL, pt denies s/s Extremities:  +2 pulses, grip strength equal, strong, no edema, no cyanosis, no clubbing Integumentary: pt denies any rashes, open or non healing wounds Psy/Soc: Pt endorses depression, anxiety and health related stress. Pt states he has great support from  his wife and can depend on his daughters if needed.  Assistive devices: Pt states he uses a CPAP nightly  Lyam reports he  does take medications as prescribed. Patient states he  follows a regular  diet. The patient has been trying to lose weight through a healthy diet and exercise program. Pt's weight will be monitored closely.   Demonstration and practice of PLB using pulse oximeter. Miquel able to return demonstration satisfactorily. Safety and hand hygiene in the exercise area reviewed with patient. Aahil voices understanding of the information reviewed. Department expectations discussed with patient and achievable goals were set. The patient shows enthusiasm about attending the program and we look forward to working with Jesusita Oka. Fabrizio completed a 6 min walk test today and is scheduled to begin exercise on 07/24/23 at 1315.   1250-1400 Essie Hart

## 2023-07-18 NOTE — Progress Notes (Signed)
 Pulmonary Individual Treatment Plan  Patient Details  Name: Matthew Patrick MRN: [id number] Date of Birth: 08/05/1945 Referring Provider:   Doristine Devoid Pulmonary Rehab Walk Test from 07/18/2023 in [facility]  Referring Provider Briones       Initial Encounter Date:  Flowsheet Row Pulmonary Rehab Walk Test from 07/18/2023 in [facility]  Date 07/18/23       Visit Diagnosis: Stage 2 moderate COPD by GOLD classification (HCC)  Low back pain, unspecified back pain laterality, unspecified chronicity, unspecified whether sciatica present  Patient's Home Medications on Admission:   Current Outpatient Medications:    Acetaminophen (TYLENOL PO), Take 2 tablets by mouth daily as needed (pain)., Disp: , Rfl:    Albuterol Sulfate (PROAIR RESPICLICK) 108 (90 Base) MCG/ACT AEPB, Inhale into the lungs., Disp: , Rfl:    alprostadil (EDEX) 20 MCG injection, by Intracavernosal route., Disp: , Rfl:    amLODipine (NORVASC) 5 MG tablet, Take 5 mg by mouth every morning., Disp: , Rfl:    aspirin 81 MG tablet, Take 81 mg by mouth daily., Disp: , Rfl:    azelastine (ASTELIN) 0.1 % nasal spray, Place 2 sprays into the nose 2 (two) times daily., Disp: , Rfl:    busPIRone (BUSPAR) 15 MG tablet, Take 15 mg by mouth 3 (three) times daily., Disp: , Rfl:    escitalopram (LEXAPRO) 10 MG tablet, Take 10 mg by mouth daily., Disp: , Rfl:    fluticasone furoate-vilanterol (BREO ELLIPTA) 100-25 MCG/INH AEPB, Inhale 1 puff into the lungs daily., Disp: 1 each, Rfl: 5   galantamine (RAZADYNE ER) 8 MG 24 hr capsule, Take 8 mg by mouth daily with breakfast., Disp: , Rfl:    ibuprofen (ADVIL) 600 MG tablet, Take 600 mg by mouth every 8 (eight) hours as needed for moderate pain (pain score 4-6)., Disp: , Rfl:    latanoprost (XALATAN) 0.005 % ophthalmic solution, Place 1 drop into both eyes at bedtime., Disp: ,  Rfl:    magnesium oxide (MAG-OX) 400 (240 Mg) MG tablet, Take 420 mg by mouth 2 (two) times daily. Take for leg cramping, Disp: , Rfl:    meclizine (ANTIVERT) 25 MG tablet, Take 12.5 mg by mouth 3 (three) times daily as needed for dizziness., Disp: , Rfl:    memantine (NAMENDA) 10 MG tablet, Take 20 mg by mouth 2 (two) times daily., Disp: , Rfl:    Misc Natural Products (PROSTATE HEALTH) CAPS, Take 1 capsule by mouth daily., Disp: , Rfl:    mometasone (ASMANEX) 220 MCG/ACT inhaler, Inhale 1 puff into the lungs daily., Disp: , Rfl:    Multiple Vitamins-Minerals (EYE HEALTH PO), Take 1 tablet by mouth daily., Disp: , Rfl:    pramipexole (MIRAPEX) 0.125 MG tablet, Take 0.125 mg by mouth 4 (four) times daily. Marland Kitchen125 mg three times daily and at bedtime, Disp: , Rfl:    ropinirole (REQUIP) 5 MG tablet, Take 1 tablet by mouth 3 (three) times daily. 5 mg twice daily and 10 mg at bedtime, Disp: , Rfl:    rosuvastatin (CRESTOR) 10 MG tablet, Take 10 mg by mouth daily., Disp: , Rfl:    tamsulosin (FLOMAX) 0.4 MG CAPS capsule, Take 0.8 mg by mouth at bedtime. , Disp: , Rfl:    temazepam (RESTORIL) 7.5 MG capsule, Take 7.5 mg by mouth at bedtime as needed for sleep., Disp: , Rfl:    TIOTROPIUM BROMIDE-OLODATEROL  IN, Inhale 2.5 mcg into the lungs daily. 2 puffs/daily, Disp: , Rfl:    traZODone (DESYREL) 50 MG tablet, Take 50 mg by mouth at bedtime., Disp: , Rfl:    valproic acid (DEPAKENE) 250 MG capsule, Take 750 mg by mouth at bedtime., Disp: , Rfl:    vardenafil (LEVITRA) 20 MG tablet, Take 20 mg by mouth daily as needed for erectile dysfunction., Disp: , Rfl:    vitamin B-12 (CYANOCOBALAMIN) 500 MCG tablet, Take 500 mcg by mouth 2 (two) times daily. (Patient not taking: Reported on 07/18/2023), Disp: , Rfl:   Past Medical History: Past Medical History:  Diagnosis Date   Abnormal findings on diagnostic imaging of lung    Accidental exposure to phenoxyacid derivative herbicide    Actinic keratosis     Allergic rhinitis    Anxiety    Asthma    Benign prostatic hyperplasia    Bipolar affective disorder, mixed, severe, with psychotic behavior (HCC)    Cognitive impairment    Depression    Drug induced akathisia    Dysuria    Exposure to potentially hazardous substance    Functional diarrhea    Hyperlipemia    Hypertension    Insomnia    Lumbago 06/27/2016   Major depressive disorder    Parkinson disease (HCC)    Restless leg syndrome    Sleep apnea    has c-pap    Tremor due to disorder of central nervous system    Vascular dementia without behavioral disturbance (HCC)     Tobacco Use: Social History   Tobacco Use  Smoking Status Former   Current packs/day: 0.00   Average packs/day: 1 pack/day for 5.0 years (5.0 ttl pk-yrs)   Types: Cigarettes   Start date: 04/24/1962   Quit date: 04/25/1967   Years since quitting: 56.2  Smokeless Tobacco Never    Labs: Review Flowsheet       Latest Ref Rng & Units 03/26/2011 09/17/2015 04/10/2016 01/11/2017  Labs for ITP Cardiac and Pulmonary Rehab  Cholestrol 0 - 200 mg/dL 161  096  045  - -  LDL (calc) 0 - 99 mg/dL 43  46  87  - -  HDL-C >40 mg/dL 45  44  58  - -  Trlycerides <150 mg/dL 409  811  914  - -  Hemoglobin A1c 4.0 - 6.0 % 5.7  5.7  - -  TCO2 22 - 32 mmol/L - - 28  27     Details       Multiple values from one day are sorted in reverse-chronological order         Capillary Blood Glucose: Lab Results  Component Value Date   GLUCAP 89 06/07/2021   GLUCAP 79 04/29/2018   GLUCAP 90 01/11/2017     Pulmonary Assessment Scores:  Pulmonary Assessment Scores     Row Name 07/18/23 1323 07/18/23 1342       ADL UCSD   ADL Phase -- Entry    SOB Score total -- 38      CAT Score   CAT Score -- 19      mMRC Score   mMRC Score -- 1            UCSD: Self-administered rating of dyspnea associated with activities of daily living (ADLs) 6-point scale (0 = "not at all" to 5 = "maximal or unable to do because  of breathlessness")  Scoring Scores range from 0 to 120.  Minimally important difference  is 5 units  CAT: CAT can identify the health impairment of COPD patients and is better correlated with disease progression.  CAT has a scoring range of zero to 40. The CAT score is classified into four groups of low (less than 10), medium (10 - 20), high (21-30) and very high (31-40) based on the impact level of disease on health status. A CAT score over 10 suggests significant symptoms.  A worsening CAT score could be explained by an exacerbation, poor medication adherence, poor inhaler technique, or progression of COPD or comorbid conditions.  CAT MCID is 2 points  mMRC: mMRC (Modified Medical Research Council) Dyspnea Scale is used to assess the degree of baseline functional disability in patients of respiratory disease due to dyspnea. No minimal important difference is established. A decrease in score of 1 point or greater is considered a positive change.   Pulmonary Function Assessment:  Pulmonary Function Assessment - 07/18/23 1342       Breath   Bilateral Breath Sounds Clear    Shortness of Breath Yes;Limiting activity;Fear of Shortness of Breath             Exercise Target Goals: Exercise Program Goal: Individual exercise prescription set using results from initial 6 min walk test and THRR while considering  patient's activity barriers and safety.   Exercise Prescription Goal: Initial exercise prescription builds to 30-45 minutes a day of aerobic activity, 2-3 days per week.  Home exercise guidelines will be given to patient during program as part of exercise prescription that the participant will acknowledge.  Activity Barriers & Risk Stratification:  Activity Barriers & Cardiac Risk Stratification - 07/18/23 1328       Activity Barriers & Cardiac Risk Stratification   Activity Barriers Muscular Weakness;Shortness of Breath;Deconditioning             6 Minute Walk:  6  Minute Walk     Row Name 07/18/23 1416         6 Minute Walk   Phase Initial     Distance 1510 feet     Walk Time 6 minutes     # of Rest Breaks 0     MPH 2.86     METS 3.43     RPE 13     Perceived Dyspnea  2     VO2 Peak 12.01     Symptoms No     Resting HR 75 bpm     Resting BP 122/60     Resting Oxygen Saturation  95 %     Exercise Oxygen Saturation  during 6 min walk 95 %     Max Ex. HR 2.66 bpm     Max Ex. BP 186/70     2 Minute Post BP 150/70       Interval HR   1 Minute HR 97     2 Minute HR 103     3 Minute HR 103     4 Minute HR 104     5 Minute HR 108     6 Minute HR 110     2 Minute Post HR 93     Interval Heart Rate? Yes       Interval Oxygen   Interval Oxygen? Yes     Baseline Oxygen Saturation % 95 %     1 Minute Oxygen Saturation % 97 %     1 Minute Liters of Oxygen 0 L     2 Minute Oxygen Saturation %  95 %     2 Minute Liters of Oxygen 0 L     3 Minute Oxygen Saturation % 96 %     3 Minute Liters of Oxygen 0 L     4 Minute Oxygen Saturation % 96 %     4 Minute Liters of Oxygen 0 L     5 Minute Oxygen Saturation % 96 %     5 Minute Liters of Oxygen 0 L     6 Minute Oxygen Saturation % 96 %     6 Minute Liters of Oxygen 0 L     2 Minute Post Oxygen Saturation % 97 %     2 Minute Post Liters of Oxygen 0 L              Oxygen Initial Assessment:  Oxygen Initial Assessment - 07/18/23 1321       Home Oxygen   Home Oxygen Device None    Sleep Oxygen Prescription CPAP    Home Exercise Oxygen Prescription None    Home Resting Oxygen Prescription None      Initial 6 min Walk   Oxygen Used None      Program Oxygen Prescription   Program Oxygen Prescription None      Intervention   Short Term Goals To learn and understand importance of maintaining oxygen saturations>88%;To learn and understand importance of monitoring SPO2 with pulse oximeter and demonstrate accurate use of the pulse oximeter.;To learn and demonstrate proper pursed lip  breathing techniques or other breathing techniques.     Long  Term Goals Exhibits proper breathing techniques, such as pursed lip breathing or other method taught during program session;Verbalizes importance of monitoring SPO2 with pulse oximeter and return demonstration;Compliance with respiratory medication;Maintenance of O2 saturations>88%             Oxygen Re-Evaluation:   Oxygen Discharge (Final Oxygen Re-Evaluation):   Initial Exercise Prescription:  Initial Exercise Prescription - 07/18/23 1400       Date of Initial Exercise RX and Referring Provider   Date 07/18/23    Referring Provider Briones    Expected Discharge Date 10/11/23      Treadmill   MPH 2    Grade 0    Minutes 15    METs 2.5      Bike   Level 3    Minutes 15    METs 3      Prescription Details   Frequency (times per week) 2    Duration Progress to 30 minutes of continuous aerobic without signs/symptoms of physical distress      Intensity   THRR 40-80% of Max Heartrate 57-114    Ratings of Perceived Exertion 11-13    Perceived Dyspnea 0-4      Progression   Progression Continue to progress workloads to maintain intensity without signs/symptoms of physical distress.      Resistance Training   Training Prescription Yes    Weight --   black bands   Reps 10-15             Perform Capillary Blood Glucose checks as needed.  Exercise Prescription Changes:   Exercise Comments:   Exercise Goals and Review:   Exercise Goals     Row Name 07/18/23 1321             Exercise Goals   Increase Physical Activity Yes       Intervention Provide advice, education, support and counseling about physical activity/exercise needs.;Develop an  individualized exercise prescription for aerobic and resistive training based on initial evaluation findings, risk stratification, comorbidities and participant's personal goals.       Expected Outcomes Short Term: Attend rehab on a regular basis to  increase amount of physical activity.;Long Term: Exercising regularly at least 3-5 days a week.;Long Term: Add in home exercise to make exercise part of routine and to increase amount of physical activity.       Increase Strength and Stamina Yes       Intervention Provide advice, education, support and counseling about physical activity/exercise needs.;Develop an individualized exercise prescription for aerobic and resistive training based on initial evaluation findings, risk stratification, comorbidities and participant's personal goals.       Expected Outcomes Short Term: Increase workloads from initial exercise prescription for resistance, speed, and METs.;Short Term: Perform resistance training exercises routinely during rehab and add in resistance training at home;Long Term: Improve cardiorespiratory fitness, muscular endurance and strength as measured by increased METs and functional capacity ( )       Able to understand and use rate of perceived exertion (RPE) scale Yes       Intervention Provide education and explanation on how to use RPE scale       Expected Outcomes Short Term: Able to use RPE daily in rehab to express subjective intensity level;Long Term:  Able to use RPE to guide intensity level when exercising independently       Able to understand and use Dyspnea scale Yes       Intervention Provide education and explanation on how to use Dyspnea scale       Expected Outcomes Short Term: Able to use Dyspnea scale daily in rehab to express subjective sense of shortness of breath during exertion;Long Term: Able to use Dyspnea scale to guide intensity level when exercising independently       Knowledge and understanding of Target Heart Rate Range (THRR) Yes       Intervention Provide education and explanation of THRR including how the numbers were predicted and where they are located for reference       Expected Outcomes Short Term: Able to state/look up THRR;Short Term: Able to use daily  as guideline for intensity in rehab;Long Term: Able to use THRR to govern intensity when exercising independently       Understanding of Exercise Prescription Yes       Intervention Provide education, explanation, and written materials on patient's individual exercise prescription       Expected Outcomes Short Term: Able to explain program exercise prescription;Long Term: Able to explain home exercise prescription to exercise independently                Exercise Goals Re-Evaluation :   Discharge Exercise Prescription (Final Exercise Prescription Changes):   Nutrition:  Target Goals: Understanding of nutrition guidelines, daily intake of sodium 1500mg , cholesterol 200mg , calories 30% from fat and 7% or less from saturated fats, daily to have 5 or more servings of fruits and vegetables.  Biometrics:  Pre Biometrics - 07/18/23 1423       Pre Biometrics   Grip Strength 29 kg              Nutrition Therapy Plan and Nutrition Goals:   Nutrition Assessments:  MEDIFICTS Score Key: >=70 Need to make dietary changes  40-70 Heart Healthy Diet <= 40 Therapeutic Level Cholesterol Diet   Picture Your Plate Scores: <16 Unhealthy dietary pattern with much room for improvement. 41-50 Dietary pattern  unlikely to meet recommendations for good health and room for improvement. 51-60 More healthful dietary pattern, with some room for improvement.  >60 Healthy dietary pattern, although there may be some specific behaviors that could be improved.    Nutrition Goals Re-Evaluation:   Nutrition Goals Discharge (Final Nutrition Goals Re-Evaluation):   Psychosocial: Target Goals: Acknowledge presence or absence of significant depression and/or stress, maximize coping skills, provide positive support system. Participant is able to verbalize types and ability to use techniques and skills needed for reducing stress and depression.  Initial Review & Psychosocial Screening:  Initial  Psych Review & Screening - 07/18/23 1323       Initial Review   Current issues with Current Depression;History of Depression;Current Anxiety/Panic;Current Psychotropic Meds;Current Sleep Concerns      Family Dynamics   Good Support System? Yes    Comments supportive wife      Barriers   Psychosocial barriers to participate in program The patient should benefit from training in stress management and relaxation.;Psychosocial barriers identified (see note)      Screening Interventions   Interventions Encouraged to exercise;Provide feedback about the scores to participant;To provide support and resources with identified psychosocial needs    Expected Outcomes Short Term goal: Utilizing psychosocial counselor, staff and physician to assist with identification of specific Stressors or current issues interfering with healing process. Setting desired goal for each stressor or current issue identified.;Long Term Goal: Stressors or current issues are controlled or eliminated.;Short Term goal: Identification and review with participant of any Quality of Life or Depression concerns found by scoring the questionnaire.;Long Term goal: The participant improves quality of Life and PHQ9 Scores as seen by post scores and/or verbalization of changes             Quality of Life Scores:  Scores of 19 and below usually indicate a poorer quality of life in these areas.  A difference of  2-3 points is a clinically meaningful difference.  A difference of 2-3 points in the total score of the Quality of Life Index has been associated with significant improvement in overall quality of life, self-image, physical symptoms, and general health in studies assessing change in quality of life.  PHQ-9: Review Flowsheet       07/18/2023 11/19/2017  Depression screen PHQ 2/9  Decreased Interest 2 3 3   Down, Depressed, Hopeless 0 3 3  PHQ - 2 Score 2 6 6   Altered sleeping 1 3 3   Tired, decreased energy 1 3 3   Change in  appetite 0 0 0  Feeling bad or failure about yourself  0 3 3  Trouble concentrating 0 3 3  Moving slowly or fidgety/restless 0 3 3  Suicidal thoughts 0 0 0  PHQ-9 Score 4 21 21   Difficult doing work/chores Not difficult at all Extremely dIfficult Very difficult    Details       Multiple values from one day are sorted in reverse-chronological order        Interpretation of Total Score  Total Score Depression Severity:  1-4 = Minimal depression, 5-9 = Mild depression, 10-14 = Moderate depression, 15-19 = Moderately severe depression, 20-27 = Severe depression   Psychosocial Evaluation and Intervention:  Psychosocial Evaluation - 07/18/23 1325       Psychosocial Evaluation & Interventions   Interventions Encouraged to exercise with the program and follow exercise prescription    Comments Pt is a veteran and has a history of depression, anxiety and PTSD. In the past, pt  has been hospitalized for mental health and received ECT treatments in the past. He regularly has therapy appointments and sees his psychiatrist for med management. He states that his mental health is stable and denies any needs or resources at this time.    Expected Outcomes For Josimar to have decreased symptoms of stress, anxiety, and depression. [facility] and [facility].    Continue Psychosocial Services  Follow up required by staff             Psychosocial Re-Evaluation:   Psychosocial Discharge (Final Psychosocial Re-Evaluation):   Education: Education Goals: Education classes will be provided on a weekly basis, covering required topics. Participant will state understanding/return demonstration of topics presented.  Learning Barriers/Preferences:  Learning Barriers/Preferences - 07/18/23 1325       Learning Barriers/Preferences   Learning Barriers Sight    Learning Preferences Group Instruction;Verbal Instruction;Written Material             Education Topics: Know  Your Numbers Group instruction that is supported by a PowerPoint presentation. Instructor discusses importance of knowing and understanding resting, exercise, and post-exercise oxygen saturation, heart rate, and blood pressure. Oxygen saturation, heart rate, blood pressure, rating of perceived exertion, and dyspnea are reviewed along with a normal range for these values.    Exercise for the Pulmonary Patient Group instruction that is supported by a PowerPoint presentation. Instructor discusses benefits of exercise, core components of exercise, frequency, duration, and intensity of an exercise routine, importance of utilizing pulse oximetry during exercise, safety while exercising, and options of places to exercise outside of rehab.    MET Level  Group instruction provided by PowerPoint, verbal discussion, and written material to support subject matter. Instructor reviews what METs are and how to increase METs.    Pulmonary Medications Verbally interactive group education provided by instructor with focus on inhaled medications and proper administration.   Anatomy and Physiology of the Respiratory System Group instruction provided by PowerPoint, verbal discussion, and written material to support subject matter. Instructor reviews respiratory cycle and anatomical components of the respiratory system and their functions. Instructor also reviews differences in obstructive and restrictive respiratory diseases with examples of each.    Oxygen Safety Group instruction provided by PowerPoint, verbal discussion, and written material to support subject matter. There is an overview of "What is Oxygen" and "Why do we need it".  Instructor also reviews how to create a safe environment for oxygen use, the importance of using oxygen as prescribed, and the risks of noncompliance. There is a brief discussion on traveling with oxygen and resources the patient may utilize.   Oxygen Use Group instruction  provided by PowerPoint, verbal discussion, and written material to discuss how supplemental oxygen is prescribed and different types of oxygen supply systems. Resources for more information are provided.    Breathing Techniques Group instruction that is supported by demonstration and informational handouts. Instructor discusses the benefits of pursed lip and diaphragmatic breathing and detailed demonstration on how to perform both.     Risk Factor Reduction Group instruction that is supported by a PowerPoint presentation. Instructor discusses the definition of a risk factor, different risk factors for pulmonary disease, and how the heart and lungs work together.   Pulmonary Diseases Group instruction provided by PowerPoint, verbal discussion, and written material to support subject matter. Instructor gives an overview of the different type of pulmonary diseases. There is also a discussion on risk factors and symptoms as well as ways  to manage the diseases.   Stress and Energy Conservation Group instruction provided by PowerPoint, verbal discussion, and written material to support subject matter. Instructor gives an overview of stress and the impact it can have on the body. Instructor also reviews ways to reduce stress. There is also a discussion on energy conservation and ways to conserve energy throughout the day.   Warning Signs and Symptoms Group instruction provided by PowerPoint, verbal discussion, and written material to support subject matter. Instructor reviews warning signs and symptoms of stroke, heart attack, cold and flu. Instructor also reviews ways to prevent the spread of infection.   Other Education Group or individual verbal, written, or video instructions that support the educational goals of the pulmonary rehab program.    Knowledge Questionnaire Score:  Knowledge Questionnaire Score - 07/18/23 1429       Knowledge Questionnaire Score   Pre Score 16/18              Core Components/Risk Factors/Patient Goals at Admission:  Personal Goals and Risk Factors at Admission - 07/18/23 1328       Core Components/Risk Factors/Patient Goals on Admission    Weight Management Weight Loss;Obesity    Improve shortness of breath with ADL's Yes    Intervention Provide education, individualized exercise plan and daily activity instruction to help decrease symptoms of SOB with activities of daily living.    Expected Outcomes Short Term: Improve cardiorespiratory fitness to achieve a reduction of symptoms when performing ADLs;Long Term: Be able to perform more ADLs without symptoms or delay the onset of symptoms             Core Components/Risk Factors/Patient Goals Review:    Core Components/Risk Factors/Patient Goals at Discharge (Final Review):    ITP Comments:   Comments: Dr. Mechele Collin is Medical Director for Pulmonary Rehab at [facility].

## 2023-07-24 ENCOUNTER — Encounter (HOSPITAL_COMMUNITY)
Admission: RE | Admit: 2023-07-24 | Discharge: 2023-07-24 | Disposition: A | Discharge status: Home / Self Care | Source: Ambulatory Visit | Attending: Pulmonary Disease | Admitting: Pulmonary Disease

## 2023-07-24 VITALS — Wt 187.8 lb

## 2023-07-24 DIAGNOSIS — J449 Chronic obstructive pulmonary disease, unspecified: Secondary | ICD-10-CM | POA: Diagnosis present

## 2023-07-24 DIAGNOSIS — M545 Low back pain, unspecified: Secondary | ICD-10-CM | POA: Diagnosis present

## 2023-07-24 NOTE — Progress Notes (Signed)
 Daily Session Note  Patient Details  Name: Matthew Patrick MRN: [id number] Date of Birth: [birth year] Referring Provider:   Doristine Devoid Pulmonary Rehab Walk Test from 07/18/2023 in [facility]  Referring Provider Briones       Encounter Date: 07/24/2023  Check In:  Session Check In - 07/24/23 1502       Check-In   Supervising physician immediately available to respond to emergencies [facility] MD immediately available    Physician(s) Littie Deeds, NP    Location [facility]    Staff Present Essie Hart, RN, BSN;Demonta Wombles Idelle Crouch BS, ACSM-CEP, Exercise Physiologist;Kaylee Earlene Plater, MS, ACSM-CEP, Exercise Physiologist;Casey Hermine Messick Belarus, RD, LDN    Virtual Visit No    Medication changes reported     No    Fall or balance concerns reported    No    Tobacco Cessation No Change    Warm-up and Cool-down Performed as group-led instruction   onlyt   Resistance Training Performed Yes    VAD Patient? No    PAD/SET Patient? No      Pain Assessment   Currently in Pain? No/denies    Multiple Pain Sites No             Capillary Blood Glucose: No results found for this or any previous visit (from the past 24 hours).   Exercise Prescription Changes - 07/24/23 1500       Response to Exercise   Blood Pressure (Admit) 130/68    Blood Pressure (Exercise) 180/70    Blood Pressure (Exit) 140/64    Heart Rate (Admit) 67 bpm    Heart Rate (Exercise) 101 bpm    Heart Rate (Exit) 80 bpm    Oxygen Saturation (Admit) 96 %    Oxygen Saturation (Exercise) 95 %    Oxygen Saturation (Exit) 95 %    Rating of Perceived Exertion (Exercise) 12    Perceived Dyspnea (Exercise) 2    Duration Continue with 30 min of aerobic exercise without signs/symptoms of physical distress.    Intensity THRR unchanged      Progression   Progression Continue to progress workloads to maintain intensity without signs/symptoms of physical  distress.      Resistance Training   Training Prescription Yes    Weight blue bands    Reps 10-15    Time 10 Minutes      Bike   Level 3    Minutes 15    METs 3      Track   Laps 7    Minutes 10    METs 2.6             Social History   Tobacco Use  Smoking Status Former   Current packs/day: 0.00   Average packs/day: 1 pack/day for 5.0 years (5.0 ttl pk-yrs)   Types: Cigarettes   Start date: 04/24/1962   Quit date: 04/25/1967   Years since quitting: 56.2  Smokeless Tobacco Never    Goals Met:  Exercise tolerated well No report of concerns or symptoms today Strength training completed today  Goals Unmet:  Not Applicable  Comments: Service time is from 1318 to 1453.    Dr. Mechele Collin is Medical Director for Pulmonary Rehab at [facility].

## 2023-07-26 ENCOUNTER — Encounter (HOSPITAL_COMMUNITY)
Admission: RE | Admit: 2023-07-26 | Discharge: 2023-07-26 | Disposition: A | Discharge status: Home / Self Care | Source: Ambulatory Visit | Attending: Pulmonary Disease | Admitting: Pulmonary Disease

## 2023-07-26 DIAGNOSIS — J449 Chronic obstructive pulmonary disease, unspecified: Secondary | ICD-10-CM

## 2023-07-26 NOTE — Progress Notes (Signed)
 Daily Session Note  Patient Details  Name: Matthew Patrick MRN: [id number] Date of Birth: December 24, 1945 Referring Provider:   Doristine Devoid Pulmonary Rehab Walk Test from 07/18/2023 in [facility]  Referring Provider Briones       Encounter Date: 07/26/2023  Check In:  Session Check In - 07/26/23 1505       Check-In   Supervising physician immediately available to respond to emergencies [facility] MD immediately available    Physician(s) Carlyon Shadow, NP    Location [facility]    Staff Present Essie Hart, RN, BSN;Randi Idelle Crouch BS, ACSM-CEP, Exercise Physiologist;Kaylee Earlene Plater, MS, ACSM-CEP, Exercise Physiologist;Eurika Sandy Hermine Messick Belarus, RD, LDN    Virtual Visit No    Medication changes reported     No    Fall or balance concerns reported    No    Tobacco Cessation No Change    Warm-up and Cool-down Performed as group-led instruction   onlyt   Resistance Training Performed Yes    VAD Patient? No    PAD/SET Patient? No      Pain Assessment   Currently in Pain? No/denies    Multiple Pain Sites No             Capillary Blood Glucose: No results found for this or any previous visit (from the past 24 hours).    Social History   Tobacco Use  Smoking Status Former   Current packs/day: 0.00   Average packs/day: 1 pack/day for 5.0 years (5.0 ttl pk-yrs)   Types: Cigarettes   Start date: 04/24/1962   Quit date: 04/25/1967   Years since quitting: 56.2  Smokeless Tobacco Never    Goals Met:  Proper associated with RPD/PD & O2 Sat Independence with exercise equipment Exercise tolerated well No report of concerns or symptoms today Strength training completed today  Goals Unmet:  Not Applicable  Comments: Service time is from 1316 to 1445.    Dr. Mechele Collin is Medical Director for Pulmonary Rehab at [facility].

## 2023-07-31 ENCOUNTER — Encounter (HOSPITAL_COMMUNITY)
Admission: RE | Admit: 2023-07-31 | Discharge: 2023-07-31 | Disposition: A | Discharge status: Home / Self Care | Source: Ambulatory Visit | Attending: Pulmonary Disease | Admitting: Pulmonary Disease

## 2023-07-31 DIAGNOSIS — J449 Chronic obstructive pulmonary disease, unspecified: Secondary | ICD-10-CM

## 2023-07-31 NOTE — Progress Notes (Addendum)
 Incomplete Session Note  Patient Details  Name: Matthew Patrick MRN: [id number] Date of Birth: October 20, 1945 Referring Provider:   Doristine Devoid Pulmonary Rehab Walk Test from 07/18/2023 in [facility]  Referring Provider Briones       Matthew Patrick did not complete his rehab session.  He had increased SOB and chest tightness. Lung sounds clear. He exercised for the first session but needed to leave due to not feeling well still. VSS. Has a MD appt 4/10.  Ethelda Chick BS, ACSM-CEP 07/31/2023 3:37 PM

## 2023-08-02 ENCOUNTER — Encounter (HOSPITAL_COMMUNITY)

## 2023-08-07 ENCOUNTER — Encounter (HOSPITAL_COMMUNITY)
Admission: RE | Admit: 2023-08-07 | Discharge: 2023-08-07 | Disposition: A | Discharge status: Home / Self Care | Source: Ambulatory Visit | Attending: Pulmonary Disease

## 2023-08-07 VITALS — Wt 188.7 lb

## 2023-08-07 DIAGNOSIS — J449 Chronic obstructive pulmonary disease, unspecified: Secondary | ICD-10-CM | POA: Diagnosis not present

## 2023-08-07 NOTE — Progress Notes (Signed)
 Daily Session Note  Patient Details  Name: Caliber Landess MRN: [id number] Date of Birth: [birth year] Referring Provider:   Doristine Devoid Pulmonary Rehab Walk Test from 07/18/2023 in [facility]  Referring Provider Briones       Encounter Date: 08/07/2023  Check In:  Session Check In - 08/07/23 1327       Check-In   Supervising physician immediately available to respond to emergencies [facility] MD immediately available    Physician(s) Rise Paganini, NP    Location [facility]    Staff Present Essie Hart, RN, BSN;Randi Idelle Crouch BS, ACSM-CEP, Exercise Physiologist;Kaylee Earlene Plater, MS, ACSM-CEP, Exercise Physiologist;Raelie Lohr Katrinka Blazing, RT    Virtual Visit No    Medication changes reported     No    Fall or balance concerns reported    No    Tobacco Cessation No Change    Warm-up and Cool-down Performed as group-led instruction   warm up done, pt left before cool down   Resistance Training Performed Yes    VAD Patient? No    PAD/SET Patient? No      Pain Assessment   Currently in Pain? No/denies    Multiple Pain Sites No             Capillary Blood Glucose: No results found for this or any previous visit (from the past 24 hours).   Exercise Prescription Changes - 08/07/23 1500       Response to Exercise   Blood Pressure (Admit) 108/58    Blood Pressure (Exercise) 150/60    Blood Pressure (Exit) 102/50    Heart Rate (Admit) 65 bpm    Heart Rate (Exercise) 84 bpm    Heart Rate (Exit) 71 bpm    Oxygen Saturation (Admit) 94 %    Oxygen Saturation (Exercise) 93 %    Oxygen Saturation (Exit) 94 %    Rating of Perceived Exertion (Exercise) 11    Perceived Dyspnea (Exercise) 1    Duration Continue with 30 min of aerobic exercise without signs/symptoms of physical distress.    Intensity THRR unchanged      Progression   Progression Continue to progress workloads to maintain intensity without signs/symptoms of  physical distress.      Resistance Training   Training Prescription Yes    Weight blue bands    Reps 10-15    Time 10 Minutes      Bike   Level 3    Minutes 15    METs 3.4      Track   Laps 12    Minutes 15    METs 2.85             Social History   Tobacco Use  Smoking Status Former   Current packs/day: 0.00   Average packs/day: 1 pack/day for 5.0 years (5.0 ttl pk-yrs)   Types: Cigarettes   Start date: 04/24/1962   Quit date: 04/25/1967   Years since quitting: 56.3  Smokeless Tobacco Never    Goals Met:  Proper associated with RPD/PD & O2 Sat Independence with exercise equipment Exercise tolerated well No report of concerns or symptoms today Strength training completed today  Goals Unmet:  Not Applicable  Comments: Service time is from 1312 to 1445.     Dr. Mechele Collin is Medical Director for Pulmonary Rehab at [facility].

## 2023-08-08 NOTE — Progress Notes (Signed)
 Pulmonary Individual Treatment Plan  Patient Details  Name: Matthew Patrick MRN: [id number] Date of Birth: Jan 01, 1946 Referring Provider:   Doristine Devoid Pulmonary Rehab Walk Test from 07/18/2023 in [facility]  Referring Provider Briones       Initial Encounter Date:  Flowsheet Row Pulmonary Rehab Walk Test from 07/18/2023 in [facility]  Date 07/18/23       Visit Diagnosis: Stage 2 moderate COPD by GOLD classification (HCC)  Patient's Home Medications on Admission:   Current Outpatient Medications:    Acetaminophen (TYLENOL PO), Take 2 tablets by mouth daily as needed (pain)., Disp: , Rfl:    Albuterol Sulfate (PROAIR RESPICLICK) 108 (90 Base) MCG/ACT AEPB, Inhale into the lungs., Disp: , Rfl:    alprostadil (EDEX) 20 MCG injection, by Intracavernosal route., Disp: , Rfl:    amLODipine (NORVASC) 5 MG tablet, Take 5 mg by mouth every morning., Disp: , Rfl:    aspirin 81 MG tablet, Take 81 mg by mouth daily., Disp: , Rfl:    azelastine (ASTELIN) 0.1 % nasal spray, Place 2 sprays into the nose 2 (two) times daily., Disp: , Rfl:    busPIRone (BUSPAR) 15 MG tablet, Take 15 mg by mouth 3 (three) times daily., Disp: , Rfl:    escitalopram (LEXAPRO) 10 MG tablet, Take 10 mg by mouth daily., Disp: , Rfl:    fluticasone furoate-vilanterol (BREO ELLIPTA) 100-25 MCG/INH AEPB, Inhale 1 puff into the lungs daily., Disp: 1 each, Rfl: 5   galantamine (RAZADYNE ER) 8 MG 24 hr capsule, Take 8 mg by mouth daily with breakfast., Disp: , Rfl:    ibuprofen (ADVIL) 600 MG tablet, Take 600 mg by mouth every 8 (eight) hours as needed for moderate pain (pain score 4-6)., Disp: , Rfl:    latanoprost (XALATAN) 0.005 % ophthalmic solution, Place 1 drop into both eyes at bedtime., Disp: , Rfl:    magnesium oxide (MAG-OX) 400 (240 Mg) MG tablet, Take 420 mg by mouth 2 (two) times daily. Take for leg  cramping, Disp: , Rfl:    meclizine (ANTIVERT) 25 MG tablet, Take 12.5 mg by mouth 3 (three) times daily as needed for dizziness., Disp: , Rfl:    memantine (NAMENDA) 10 MG tablet, Take 20 mg by mouth 2 (two) times daily., Disp: , Rfl:    Misc Natural Products (PROSTATE HEALTH) CAPS, Take 1 capsule by mouth daily., Disp: , Rfl:    mometasone (ASMANEX) 220 MCG/ACT inhaler, Inhale 1 puff into the lungs daily., Disp: , Rfl:    Multiple Vitamins-Minerals (EYE HEALTH PO), Take 1 tablet by mouth daily., Disp: , Rfl:    pramipexole (MIRAPEX) 0.125 MG tablet, Take 0.125 mg by mouth 4 (four) times daily. Marland Kitchen125 mg three times daily and at bedtime, Disp: , Rfl:    ropinirole (REQUIP) 5 MG tablet, Take 1 tablet by mouth 3 (three) times daily. 5 mg twice daily and 10 mg at bedtime, Disp: , Rfl:    rosuvastatin (CRESTOR) 10 MG tablet, Take 10 mg by mouth daily., Disp: , Rfl:    tamsulosin (FLOMAX) 0.4 MG CAPS capsule, Take 0.8 mg by mouth at bedtime. , Disp: , Rfl:    temazepam (RESTORIL) 7.5 MG capsule, Take 7.5 mg by mouth at bedtime as needed for sleep., Disp: , Rfl:    TIOTROPIUM BROMIDE-OLODATEROL IN, Inhale 2.5 mcg into the lungs daily. 2 puffs/daily, Disp: , Rfl:  traZODone (DESYREL) 50 MG tablet, Take 50 mg by mouth at bedtime., Disp: , Rfl:    valproic acid (DEPAKENE) 250 MG capsule, Take 750 mg by mouth at bedtime., Disp: , Rfl:    vardenafil (LEVITRA) 20 MG tablet, Take 20 mg by mouth daily as needed for erectile dysfunction., Disp: , Rfl:    vitamin B-12 (CYANOCOBALAMIN) 500 MCG tablet, Take 500 mcg by mouth 2 (two) times daily. (Patient not taking: Reported on 07/18/2023), Disp: , Rfl:   Past Medical History: Past Medical History:  Diagnosis Date   Abnormal findings on diagnostic imaging of lung    Accidental exposure to phenoxyacid derivative herbicide    Actinic keratosis    Allergic rhinitis    Anxiety    Asthma    Benign prostatic hyperplasia    Bipolar affective disorder, mixed,  severe, with psychotic behavior (HCC)    Cognitive impairment    Depression    Drug induced akathisia    Dysuria    Exposure to potentially hazardous substance    Functional diarrhea    Hyperlipemia    Hypertension    Insomnia    Lumbago 06/27/2016   Major depressive disorder    Parkinson disease (HCC)    Restless leg syndrome    Sleep apnea    has c-pap    Tremor due to disorder of central nervous system    Vascular dementia without behavioral disturbance (HCC)     Tobacco Use: Social History   Tobacco Use  Smoking Status Former   Current packs/day: 0.00   Average packs/day: 1 pack/day for 5.0 years (5.0 ttl pk-yrs)   Types: Cigarettes   Start date: 04/24/1962   Quit date: 04/25/1967   Years since quitting: 56.3  Smokeless Tobacco Never    Labs: Review Flowsheet       Latest Ref Rng & Units 03/26/2011 09/17/2015 04/10/2016 01/11/2017  Labs for ITP Cardiac and Pulmonary Rehab  Cholestrol 0 - 200 mg/dL 914  782  956  - -  LDL (calc) 0 - 99 mg/dL 43  46  87  - -  HDL-C >40 mg/dL 45  44  58  - -  Trlycerides <150 mg/dL 213  086  578  - -  Hemoglobin A1c 4.0 - 6.0 % 5.7  5.7  - -  TCO2 22 - 32 mmol/L - - 28  27     Details       Multiple values from one day are sorted in reverse-chronological order         Capillary Blood Glucose: Lab Results  Component Value Date   GLUCAP 89 06/07/2021   GLUCAP 79 04/29/2018   GLUCAP 90 01/11/2017     Pulmonary Assessment Scores:  Pulmonary Assessment Scores     Row Name 07/18/23 1323 07/18/23 1342       ADL UCSD   ADL Phase -- Entry    SOB Score total -- 38      CAT Score   CAT Score -- 19      mMRC Score   mMRC Score -- 1            UCSD: Self-administered rating of dyspnea associated with activities of daily living (ADLs) 6-point scale (0 = "not at all" to 5 = "maximal or unable to do because of breathlessness")  Scoring Scores range from 0 to 120.  Minimally important difference is 5  units  CAT: CAT can identify the health impairment of COPD patients and is  better correlated with disease progression.  CAT has a scoring range of zero to 40. The CAT score is classified into four groups of low (less than 10), medium (10 - 20), high (21-30) and very high (31-40) based on the impact level of disease on health status. A CAT score over 10 suggests significant symptoms.  A worsening CAT score could be explained by an exacerbation, poor medication adherence, poor inhaler technique, or progression of COPD or comorbid conditions.  CAT MCID is 2 points  mMRC: mMRC (Modified Medical Research Council) Dyspnea Scale is used to assess the degree of baseline functional disability in patients of respiratory disease due to dyspnea. No minimal important difference is established. A decrease in score of 1 point or greater is considered a positive change.   Pulmonary Function Assessment:  Pulmonary Function Assessment - 07/18/23 1342       Breath   Bilateral Breath Sounds Clear    Shortness of Breath Yes;Limiting activity;Fear of Shortness of Breath             Exercise Target Goals: Exercise Program Goal: Individual exercise prescription set using results from initial 6 min walk test and THRR while considering  patient's activity barriers and safety.   Exercise Prescription Goal: Initial exercise prescription builds to 30-45 minutes a day of aerobic activity, 2-3 days per week.  Home exercise guidelines will be given to patient during program as part of exercise prescription that the participant will acknowledge.  Activity Barriers & Risk Stratification:  Activity Barriers & Cardiac Risk Stratification - 07/18/23 1328       Activity Barriers & Cardiac Risk Stratification   Activity Barriers Muscular Weakness;Shortness of Breath;Deconditioning             6 Minute Walk:  6 Minute Walk     Row Name 07/18/23 1416         6 Minute Walk   Phase Initial     Distance  1510 feet     Walk Time 6 minutes     # of Rest Breaks 0     MPH 2.86     METS 3.43     RPE 13     Perceived Dyspnea  2     VO2 Peak 12.01     Symptoms No     Resting HR 75 bpm     Resting BP 122/60     Resting Oxygen Saturation  95 %     Exercise Oxygen Saturation  during 6 min walk 95 %     Max Ex. HR 2.66 bpm     Max Ex. BP 186/70     2 Minute Post BP 150/70       Interval HR   1 Minute HR 97     2 Minute HR 103     3 Minute HR 103     4 Minute HR 104     5 Minute HR 108     6 Minute HR 110     2 Minute Post HR 93     Interval Heart Rate? Yes       Interval Oxygen   Interval Oxygen? Yes     Baseline Oxygen Saturation % 95 %     1 Minute Oxygen Saturation % 97 %     1 Minute Liters of Oxygen 0 L     2 Minute Oxygen Saturation % 95 %     2 Minute Liters of Oxygen 0 L  3 Minute Oxygen Saturation % 96 %     3 Minute Liters of Oxygen 0 L     4 Minute Oxygen Saturation % 96 %     4 Minute Liters of Oxygen 0 L     5 Minute Oxygen Saturation % 96 %     5 Minute Liters of Oxygen 0 L     6 Minute Oxygen Saturation % 96 %     6 Minute Liters of Oxygen 0 L     2 Minute Post Oxygen Saturation % 97 %     2 Minute Post Liters of Oxygen 0 L              Oxygen Initial Assessment:  Oxygen Initial Assessment - 07/18/23 1321       Home Oxygen   Home Oxygen Device None    Sleep Oxygen Prescription CPAP    Home Exercise Oxygen Prescription None    Home Resting Oxygen Prescription None      Initial 6 min Walk   Oxygen Used None      Program Oxygen Prescription   Program Oxygen Prescription None      Intervention   Short Term Goals To learn and understand importance of maintaining oxygen saturations>88%;To learn and understand importance of monitoring SPO2 with pulse oximeter and demonstrate accurate use of the pulse oximeter.;To learn and demonstrate proper pursed lip breathing techniques or other breathing techniques.     Long  Term Goals Exhibits proper  breathing techniques, such as pursed lip breathing or other method taught during program session;Verbalizes importance of monitoring SPO2 with pulse oximeter and return demonstration;Compliance with respiratory medication;Maintenance of O2 saturations>88%             Oxygen Re-Evaluation:  Oxygen Re-Evaluation     Row Name 08/03/23 0741             Program Oxygen Prescription   Program Oxygen Prescription None         Home Oxygen   Home Oxygen Device None       Sleep Oxygen Prescription CPAP       Home Exercise Oxygen Prescription None       Home Resting Oxygen Prescription None         Goals/Expected Outcomes   Short Term Goals To learn and understand importance of maintaining oxygen saturations>88%;To learn and understand importance of monitoring SPO2 with pulse oximeter and demonstrate accurate use of the pulse oximeter.;To learn and demonstrate proper pursed lip breathing techniques or other breathing techniques.        Long  Term Goals Exhibits proper breathing techniques, such as pursed lip breathing or other method taught during program session;Verbalizes importance of monitoring SPO2 with pulse oximeter and return demonstration;Compliance with respiratory medication;Maintenance of O2 saturations>88%       Goals/Expected Outcomes Compliance and understanding of oxygen saturation monitoring and breathing techniques to decrease shortness of breath.                Oxygen Discharge (Final Oxygen Re-Evaluation):  Oxygen Re-Evaluation - 08/03/23 0741       Program Oxygen Prescription   Program Oxygen Prescription None      Home Oxygen   Home Oxygen Device None    Sleep Oxygen Prescription CPAP    Home Exercise Oxygen Prescription None    Home Resting Oxygen Prescription None      Goals/Expected Outcomes   Short Term Goals To learn and understand importance of maintaining oxygen saturations>88%;To learn  and understand importance of monitoring SPO2 with pulse  oximeter and demonstrate accurate use of the pulse oximeter.;To learn and demonstrate proper pursed lip breathing techniques or other breathing techniques.     Long  Term Goals Exhibits proper breathing techniques, such as pursed lip breathing or other method taught during program session;Verbalizes importance of monitoring SPO2 with pulse oximeter and return demonstration;Compliance with respiratory medication;Maintenance of O2 saturations>88%    Goals/Expected Outcomes Compliance and understanding of oxygen saturation monitoring and breathing techniques to decrease shortness of breath.             Initial Exercise Prescription:  Initial Exercise Prescription - 07/18/23 1400       Date of Initial Exercise RX and Referring Provider   Date 07/18/23    Referring Provider Briones    Expected Discharge Date 10/11/23      Treadmill   MPH 2    Grade 0    Minutes 15    METs 2.5      Bike   Level 3    Minutes 15    METs 3      Prescription Details   Frequency (times per week) 2    Duration Progress to 30 minutes of continuous aerobic without signs/symptoms of physical distress      Intensity   THRR 40-80% of Max Heartrate 57-114    Ratings of Perceived Exertion 11-13    Perceived Dyspnea 0-4      Progression   Progression Continue to progress workloads to maintain intensity without signs/symptoms of physical distress.      Resistance Training   Training Prescription Yes    Weight --   black bands   Reps 10-15             Perform Capillary Blood Glucose checks as needed.  Exercise Prescription Changes:   Exercise Prescription Changes     Row Name 07/24/23 1500 08/07/23 1500           Response to Exercise   Blood Pressure (Admit) 130/68 108/58      Blood Pressure (Exercise) 180/70 150/60      Blood Pressure (Exit) 140/64 102/50      Heart Rate (Admit) 67 bpm 65 bpm      Heart Rate (Exercise) 101 bpm 84 bpm      Heart Rate (Exit) 80 bpm 71 bpm      Oxygen  Saturation (Admit) 96 % 94 %      Oxygen Saturation (Exercise) 95 % 93 %      Oxygen Saturation (Exit) 95 % 94 %      Rating of Perceived Exertion (Exercise) 12 11      Perceived Dyspnea (Exercise) 2 1      Duration Continue with 30 min of aerobic exercise without signs/symptoms of physical distress. Continue with 30 min of aerobic exercise without signs/symptoms of physical distress.      Intensity THRR unchanged THRR unchanged        Progression   Progression Continue to progress workloads to maintain intensity without signs/symptoms of physical distress. Continue to progress workloads to maintain intensity without signs/symptoms of physical distress.        Resistance Training   Training Prescription Yes Yes      Weight blue bands blue bands      Reps 10-15 10-15      Time 10 Minutes 10 Minutes        Bike   Level 3 3  Minutes 15 15      METs 3 3.4        Track   Laps 7 12      Minutes 10 15      METs 2.6 2.85               Exercise Comments:   Exercise Comments     Row Name 07/24/23 1513           Exercise Comments Pt completed his first day of group exercise. He attempted to walk the treadmill however he felt exerted and dizzy after a few minutes. Therefore walked the track for 10 min, METs 2.6, tolerated well. Will continue on track. He then exercised on the scifit bike on level 3, METs 3, for 15 min. Tolerated well. Performed warm up and cool down standing without limitations. Discussed METs.                Exercise Goals and Review:   Exercise Goals     Row Name 07/18/23 1321             Exercise Goals   Increase Physical Activity Yes       Intervention Provide advice, education, support and counseling about physical activity/exercise needs.;Develop an individualized exercise prescription for aerobic and resistive training based on initial evaluation findings, risk stratification, comorbidities and participant's personal goals.       Expected  Outcomes Short Term: Attend rehab on a regular basis to increase amount of physical activity.;Long Term: Exercising regularly at least 3-5 days a week.;Long Term: Add in home exercise to make exercise part of routine and to increase amount of physical activity.       Increase Strength and Stamina Yes       Intervention Provide advice, education, support and counseling about physical activity/exercise needs.;Develop an individualized exercise prescription for aerobic and resistive training based on initial evaluation findings, risk stratification, comorbidities and participant's personal goals.       Expected Outcomes Short Term: Increase workloads from initial exercise prescription for resistance, speed, and METs.;Short Term: Perform resistance training exercises routinely during rehab and add in resistance training at home;Long Term: Improve cardiorespiratory fitness, muscular endurance and strength as measured by increased METs and functional capacity ( )       Able to understand and use rate of perceived exertion (RPE) scale Yes       Intervention Provide education and explanation on how to use RPE scale       Expected Outcomes Short Term: Able to use RPE daily in rehab to express subjective intensity level;Long Term:  Able to use RPE to guide intensity level when exercising independently       Able to understand and use Dyspnea scale Yes       Intervention Provide education and explanation on how to use Dyspnea scale       Expected Outcomes Short Term: Able to use Dyspnea scale daily in rehab to express subjective sense of shortness of breath during exertion;Long Term: Able to use Dyspnea scale to guide intensity level when exercising independently       Knowledge and understanding of Target Heart Rate Range (THRR) Yes       Intervention Provide education and explanation of THRR including how the numbers were predicted and where they are located for reference       Expected Outcomes Short Term:  Able to state/look up THRR;Short Term: Able to use daily as guideline for intensity in rehab;Long Term:  Able to use THRR to govern intensity when exercising independently       Understanding of Exercise Prescription Yes       Intervention Provide education, explanation, and written materials on patient's individual exercise prescription       Expected Outcomes Short Term: Able to explain program exercise prescription;Long Term: Able to explain home exercise prescription to exercise independently                Exercise Goals Re-Evaluation :  Exercise Goals Re-Evaluation     Row Name 08/03/23 0739             Exercise Goal Re-Evaluation   Exercise Goals Review Increase Physical Activity;Able to understand and use Dyspnea scale;Understanding of Exercise Prescription;Increase Strength and Stamina;Knowledge and understanding of Target Heart Rate Range (THRR);Able to understand and use rate of perceived exertion (RPE) scale       Comments Pt has completed 2 1/2 exercise sessions. He needed to leave the last class early due to significant SOB. He missed the last session for a MD appt. Pt is walking the track for 15 min, METs 3 at the most. He then is exercising on the scifit bike for 15 min, level 3, METs 3.3. He is motivated. Performs warm up and cool down without limitations, black bands. Will progress as tolerated.       Expected Outcomes Through exercise at rehab and at home, patient will increase physical capacity and ADL's will be easier to perform. The patient will also feel comfortable establishing a home exercise program.                Discharge Exercise Prescription (Final Exercise Prescription Changes):  Exercise Prescription Changes - 08/07/23 1500       Response to Exercise   Blood Pressure (Admit) 108/58    Blood Pressure (Exercise) 150/60    Blood Pressure (Exit) 102/50    Heart Rate (Admit) 65 bpm    Heart Rate (Exercise) 84 bpm    Heart Rate (Exit) 71 bpm     Oxygen Saturation (Admit) 94 %    Oxygen Saturation (Exercise) 93 %    Oxygen Saturation (Exit) 94 %    Rating of Perceived Exertion (Exercise) 11    Perceived Dyspnea (Exercise) 1    Duration Continue with 30 min of aerobic exercise without signs/symptoms of physical distress.    Intensity THRR unchanged      Progression   Progression Continue to progress workloads to maintain intensity without signs/symptoms of physical distress.      Resistance Training   Training Prescription Yes    Weight blue bands    Reps 10-15    Time 10 Minutes      Bike   Level 3    Minutes 15    METs 3.4      Track   Laps 12    Minutes 15    METs 2.85             Nutrition:  Target Goals: Understanding of nutrition guidelines, daily intake of sodium 1500mg , cholesterol 200mg , calories 30% from fat and 7% or less from saturated fats, daily to have 5 or more servings of fruits and vegetables.  Biometrics:  Pre Biometrics - 07/18/23 1423       Pre Biometrics   Grip Strength 29 kg              Nutrition Therapy Plan and Nutrition Goals:  Nutrition Therapy & Goals - 07/24/23 1513  Nutrition Therapy   Diet General Healthy Diet    Drug/Food Interactions Statins/Certain Fruits      Personal Nutrition Goals   Nutrition Goal Patient to improve diet quality by using the plate method as a guide for meal planning to include lean protein/plant protein, fruits, vegetables, whole grains, nonfat dairy as part of a well-balanced diet.    Personal Goal #2 Patient to identify strategies for weight loss of 0.5-2.0# per week.    Comments Matthew Patrick has medical history of HTN, OSA, COPD. Matthew Patrick reports motivation to lose weight; will continue to discuss strategies for creating calorie deficit including calorie density, the plate method as aguide for meal planning, etc. Patient will benefit from participation in pulmonary rehab for nutrition, exercise, and lifestyle modification.      Intervention Plan    Intervention Prescribe, educate and counsel regarding individualized specific dietary modifications aiming towards targeted core components such as weight, hypertension, lipid management, diabetes, heart failure and other comorbidities.;Nutrition handout(s) given to patient.    Expected Outcomes Short Term Goal: Understand basic principles of dietary content, such as calories, fat, sodium, cholesterol and nutrients.;Long Term Goal: Adherence to prescribed nutrition plan.             Nutrition Assessments:  MEDIFICTS Score Key: >=70 Need to make dietary changes  40-70 Heart Healthy Diet <= 40 Therapeutic Level Cholesterol Diet   Picture Your Plate Scores: <16 Unhealthy dietary pattern with much room for improvement. 41-50 Dietary pattern unlikely to meet recommendations for good health and room for improvement. 51-60 More healthful dietary pattern, with some room for improvement.  >60 Healthy dietary pattern, although there may be some specific behaviors that could be improved.    Nutrition Goals Re-Evaluation:  Nutrition Goals Re-Evaluation     Row Name 07/24/23 1513             Goals   Current Weight 187 lb 13.3 oz (85.2 kg)       Comment labs WNL       Expected Outcome Barrie has medical history of HTN, OSA, COPD. Clois reports motivation to lose weight; will continue to discuss strategies for creating calorie deficit including calorie density, the plate method as aguide for meal planning, etc. Patient will benefit from participation in pulmonary rehab for nutrition, exercise, and lifestyle modification.                Nutrition Goals Discharge (Final Nutrition Goals Re-Evaluation):  Nutrition Goals Re-Evaluation - 07/24/23 1513       Goals   Current Weight 187 lb 13.3 oz (85.2 kg)    Comment labs WNL    Expected Outcome Matthew Patrick has medical history of HTN, OSA, COPD. Matthew Patrick reports motivation to lose weight; will continue to discuss strategies for creating calorie deficit  including calorie density, the plate method as aguide for meal planning, etc. Patient will benefit from participation in pulmonary rehab for nutrition, exercise, and lifestyle modification.             Psychosocial: Target Goals: Acknowledge presence or absence of significant depression and/or stress, maximize coping skills, provide positive support system. Participant is able to verbalize types and ability to use techniques and skills needed for reducing stress and depression.  Initial Review & Psychosocial Screening:  Initial Psych Review & Screening - 07/18/23 1323       Initial Review   Current issues with Current Depression;History of Depression;Current Anxiety/Panic;Current Psychotropic Meds;Current Sleep Concerns      Family Dynamics  Good Support System? Yes    Comments supportive wife      Barriers   Psychosocial barriers to participate in program The patient should benefit from training in stress management and relaxation.;Psychosocial barriers identified (see note)      Screening Interventions   Interventions Encouraged to exercise;Provide feedback about the scores to participant;To provide support and resources with identified psychosocial needs    Expected Outcomes Short Term goal: Utilizing psychosocial counselor, staff and physician to assist with identification of specific Stressors or current issues interfering with healing process. Setting desired goal for each stressor or current issue identified.;Long Term Goal: Stressors or current issues are controlled or eliminated.;Short Term goal: Identification and review with participant of any Quality of Life or Depression concerns found by scoring the questionnaire.;Long Term goal: The participant improves quality of Life and PHQ9 Scores as seen by post scores and/or verbalization of changes             Quality of Life Scores:  Scores of 19 and below usually indicate a poorer quality of life in these areas.  A  difference of  2-3 points is a clinically meaningful difference.  A difference of 2-3 points in the total score of the Quality of Life Index has been associated with significant improvement in overall quality of life, self-image, physical symptoms, and general health in studies assessing change in quality of life.  PHQ-9: Review Flowsheet       07/18/2023 11/19/2017  Depression screen PHQ 2/9  Decreased Interest 2 3 3   Down, Depressed, Hopeless 0 3 3  PHQ - 2 Score 2 6 6   Altered sleeping 1 3 3   Tired, decreased energy 1 3 3   Change in appetite 0 0 0  Feeling bad or failure about yourself  0 3 3  Trouble concentrating 0 3 3  Moving slowly or fidgety/restless 0 3 3  Suicidal thoughts 0 0 0  PHQ-9 Score 4 21 21   Difficult doing work/chores Not difficult at all Extremely dIfficult Very difficult    Details       Multiple values from one day are sorted in reverse-chronological order        Interpretation of Total Score  Total Score Depression Severity:  1-4 = Minimal depression, 5-9 = Mild depression, 10-14 = Moderate depression, 15-19 = Moderately severe depression, 20-27 = Severe depression   Psychosocial Evaluation and Intervention:  Psychosocial Evaluation - 07/18/23 1325       Psychosocial Evaluation & Interventions   Interventions Encouraged to exercise with the program and follow exercise prescription    Comments Pt is a veteran and has a history of depression, anxiety and PTSD. In the past, pt has been hospitalized for mental health and received ECT treatments in the past. He regularly has therapy appointments and sees his psychiatrist for med management. He states that his mental health is stable and denies any needs or resources at this time.    Expected Outcomes For Matthew Patrick to have decreased symptoms of stress, anxiety, and depression. [facility].    Continue Psychosocial Services  Follow up required by staff              Psychosocial Re-Evaluation:  Psychosocial Re-Evaluation     Row Name 08/01/23 [phone]             Psychosocial Re-Evaluation   Current issues with Current Depression;History of Depression;Current Anxiety/Panic;Current Psychotropic Meds;Current Sleep Concerns;Current Stress Concerns  Comments Pt has an extensive mental health history. He is under the care of the [place] and sees a therapist and psychiatrist regularly. He is compliant with taking his psychotropic meds. Neo denies any additional needs or resources currently.       Expected Outcomes For Matthew Patrick to have decreased symptoms of stress, anxiety, and depression. [facility] and be [facility].       Interventions Encouraged to attend Pulmonary Rehabilitation for the exercise;Relaxation education;Stress management education       Continue Psychosocial Services  Follow up required by staff                Psychosocial Discharge (Final Psychosocial Re-Evaluation):  Psychosocial Re-Evaluation - 08/01/23 0943       Psychosocial Re-Evaluation   Current issues with Current Depression;History of Depression;Current Anxiety/Panic;Current Psychotropic Meds;Current Sleep Concerns;Current Stress Concerns    Comments Pt has an extensive mental health history. He is under the care of the [place] and sees a therapist and psychiatrist regularly. He is compliant with taking his psychotropic meds. Matthew Patrick denies any additional needs or resources currently.    Expected Outcomes For Matthew Patrick to have decreased symptoms of stress, anxiety, and depression. [facility] and [facility].    Interventions Encouraged to attend Pulmonary Rehabilitation for the exercise;Relaxation education;Stress management education    Continue Psychosocial Services  Follow up required by staff             Education: Education Goals: Education classes will be provided on a weekly basis, covering required topics. Participant will state  understanding/return demonstration of topics presented.  Learning Barriers/Preferences:  Learning Barriers/Preferences - 07/18/23 1325       Learning Barriers/Preferences   Learning Barriers Sight    Learning Preferences Group Instruction;Verbal Instruction;Written Material             Education Topics: Know Your Numbers Group instruction that is supported by a PowerPoint presentation. Instructor discusses importance of knowing and understanding resting, exercise, and post-exercise oxygen saturation, heart rate, and blood pressure. Oxygen saturation, heart rate, blood pressure, rating of perceived exertion, and dyspnea are reviewed along with a normal range for these values.  Flowsheet Row PULMONARY REHAB CHRONIC OBSTRUCTIVE PULMONARY DISEASE from 07/26/2023 in [facility]  Date 07/26/23  Educator EP  Instruction Review Code 1- Verbalizes Understanding       Exercise for the Pulmonary Patient Group instruction that is supported by a PowerPoint presentation. Instructor discusses benefits of exercise, core components of exercise, frequency, duration, and intensity of an exercise routine, importance of utilizing pulse oximetry during exercise, safety while exercising, and options of places to exercise outside of rehab.    MET Level  Group instruction provided by PowerPoint, verbal discussion, and written material to support subject matter. Instructor reviews what METs are and how to increase METs.    Pulmonary Medications Verbally interactive group education provided by instructor with focus on inhaled medications and proper administration.   Anatomy and Physiology of the Respiratory System Group instruction provided by PowerPoint, verbal discussion, and written material to support subject matter. Instructor reviews respiratory cycle and anatomical components of the respiratory system and their functions. Instructor also  reviews differences in obstructive and restrictive respiratory diseases with examples of each.    Oxygen Safety Group instruction provided by PowerPoint, verbal discussion, and written material to support subject matter. There is an overview of "What is Oxygen"  and "Why do we need it".  Instructor also reviews how to create a safe environment for oxygen use, the importance of using oxygen as prescribed, and the risks of noncompliance. There is a brief discussion on traveling with oxygen and resources the patient may utilize.   Oxygen Use Group instruction provided by PowerPoint, verbal discussion, and written material to discuss how supplemental oxygen is prescribed and different types of oxygen supply systems. Resources for more information are provided.    Breathing Techniques Group instruction that is supported by demonstration and informational handouts. Instructor discusses the benefits of pursed lip and diaphragmatic breathing and detailed demonstration on how to perform both.     Risk Factor Reduction Group instruction that is supported by a PowerPoint presentation. Instructor discusses the definition of a risk factor, different risk factors for pulmonary disease, and how the heart and lungs work together.   Pulmonary Diseases Group instruction provided by PowerPoint, verbal discussion, and written material to support subject matter. Instructor gives an overview of the different type of pulmonary diseases. There is also a discussion on risk factors and symptoms as well as ways to manage the diseases.   Stress and Energy Conservation Group instruction provided by PowerPoint, verbal discussion, and written material to support subject matter. Instructor gives an overview of stress and the impact it can have on the body. Instructor also reviews ways to reduce stress. There is also a discussion on energy conservation and ways to conserve energy throughout the day.   Warning Signs and  Symptoms Group instruction provided by PowerPoint, verbal discussion, and written material to support subject matter. Instructor reviews warning signs and symptoms of stroke, heart attack, cold and flu. Instructor also reviews ways to prevent the spread of infection.   Other Education Group or individual verbal, written, or video instructions that support the educational goals of the pulmonary rehab program.    Knowledge Questionnaire Score:  Knowledge Questionnaire Score - 07/18/23 1429       Knowledge Questionnaire Score   Pre Score 16/18             Core Components/Risk Factors/Patient Goals at Admission:  Personal Goals and Risk Factors at Admission - 07/18/23 1328       Core Components/Risk Factors/Patient Goals on Admission    Weight Management Weight Loss;Obesity    Improve shortness of breath with ADL's Yes    Intervention Provide education, individualized exercise plan and daily activity instruction to help decrease symptoms of SOB with activities of daily living.    Expected Outcomes Short Term: Improve cardiorespiratory fitness to achieve a reduction of symptoms when performing ADLs;Long Term: Be able to perform more ADLs without symptoms or delay the onset of symptoms             Core Components/Risk Factors/Patient Goals Review:   Goals and Risk Factor Review     Row Name 08/01/23 0946 08/01/23 0949           Core Components/Risk Factors/Patient Goals Review   Personal Goals Review -- Weight Management/Obesity;Improve shortness of breath with ADL's;Develop more efficient breathing techniques such as purse lipped breathing and diaphragmatic breathing and practicing self-pacing with activity.;Increase knowledge of respiratory medications and ability to use respiratory devices properly.      Review -- Core components/risk factors/patient goals monthly re-evaluate are as follows:   Matthew Patrick has completed 3 sessions so far. Goal progressing on weight loss. He has  been working with our dietician and she has educated him on calorie  deficit and the plate method. Goal progressing on improving his shortness of breath with ADLs. Matthew Patrick is currently exercising on the up right bike and walking the track. He is slowly increasing his lap count, METs and workload. His shortness of breath scale while exercising have been WNL. Goal met for increasing his knowledge of respiratory medications and the ability to use respiratory devices properly. Our respiratory therapist has worked with him and reviewed medication names, usage, directions, and side effects. Matthew Patrick understands without assistance and all questions answered. Goal in progress for developing more efficient breathing techniques such as purse lipped breathing and diaphragmatic breathing and practicing self-pacing with activity. We have begun teaching Matthew Patrick how to use and incorporate breathing techniques. Matthew Patrick can self-pace while walking the track. Matthew Patrick will continue to benefit from PR for nutrition, education, exercise, and lifestyle modification.      Expected Outcomes -- For Matthew Patrick to lose weight, develop more efficient breathing techniques such as purse lipped breathing and diaphragmatic breathing; and practice self-pacing with activity, and improve his shortness of breath with ADLs.               Core Components/Risk Factors/Patient Goals at Discharge (Final Review):   Goals and Risk Factor Review - 08/01/23 0949       Core Components/Risk Factors/Patient Goals Review   Personal Goals Review Weight Management/Obesity;Improve shortness of breath with ADL's;Develop more efficient breathing techniques such as purse lipped breathing and diaphragmatic breathing and practicing self-pacing with activity.;Increase knowledge of respiratory medications and ability to use respiratory devices properly.    Review Core components/risk factors/patient goals monthly re-evaluate are as follows:   Matthew Patrick has completed 3 sessions so far. Goal  progressing on weight loss. He has been working with our dietician and she has educated him on calorie deficit and the plate method. Goal progressing on improving his shortness of breath with ADLs. Matthew Patrick is currently exercising on the up right bike and walking the track. He is slowly increasing his lap count, METs and workload. His shortness of breath scale while exercising have been WNL. Goal met for increasing his knowledge of respiratory medications and the ability to use respiratory devices properly. Our respiratory therapist has worked with him and reviewed medication names, usage, directions, and side effects. Matthew Patrick understands without assistance and all questions answered. Goal in progress for developing more efficient breathing techniques such as purse lipped breathing and diaphragmatic breathing and practicing self-pacing with activity. We have begun teaching Matthew Patrick how to use and incorporate breathing techniques. Matthew Patrick can self-pace while walking the track. Matthew Patrick will continue to benefit from PR for nutrition, education, exercise, and lifestyle modification.    Expected Outcomes For Matthew Patrick to lose weight, develop more efficient breathing techniques such as purse lipped breathing and diaphragmatic breathing; and practice self-pacing with activity, and improve his shortness of breath with ADLs.             ITP Comments:Pt is making expected progress toward Pulmonary Rehab goals after completing 4 session(s). Recommend continued exercise, life style modification, education, and utilization of breathing techniques to increase stamina and strength, while also decreasing shortness of breath with exertion.  Dr. Genetta Kenning is Medical Director for Pulmonary Rehab at [facility]

## 2023-08-09 ENCOUNTER — Encounter (HOSPITAL_COMMUNITY)
Admission: RE | Admit: 2023-08-09 | Discharge: 2023-08-09 | Disposition: A | Discharge status: Home / Self Care | Source: Ambulatory Visit | Attending: Pulmonary Disease | Admitting: Pulmonary Disease

## 2023-08-09 DIAGNOSIS — J449 Chronic obstructive pulmonary disease, unspecified: Secondary | ICD-10-CM

## 2023-08-09 DIAGNOSIS — M545 Low back pain, unspecified: Secondary | ICD-10-CM

## 2023-08-09 NOTE — Progress Notes (Signed)
 Daily Session Note  Patient Details  Name: Matthew Patrick MRN: [id number] Date of Birth: 1945/07/27 Referring Provider:   Gattis Kass Pulmonary Rehab Walk Test from 07/18/2023 in [facility] for Heart, Vascular, & Lung Health  Referring Provider Briones       Encounter Date: 08/09/2023  Check In:  Session Check In - 08/09/23 1323       Check-In   Supervising physician immediately available to respond to emergencies [facility] MD immediately available    Physician(s) Charles Connor, NP    Location [facility]    Staff Present Willard Harman, RN, BSN;Randi Regis Captain BS, ACSM-CEP, Exercise Physiologist;Kaylee Nolon Baxter, MS, ACSM-CEP, Exercise Physiologist;Shiryl Ruddy Ena Harries, MS, Exercise Physiologist    Virtual Visit No    Medication changes reported     No    Fall or balance concerns reported    No    Tobacco Cessation No Change    Warm-up and Cool-down Performed as group-led instruction   warm up done, pt left before cool down   Resistance Training Performed Yes    VAD Patient? No    PAD/SET Patient? No      Pain Assessment   Currently in Pain? No/denies    Multiple Pain Sites No             Capillary Blood Glucose: No results found for this or any previous visit (from the past 24 hours).    Social History   Tobacco Use  Smoking Status Former   Current packs/day: 0.00   Average packs/day: 1 pack/day for 5.0 years (5.0 ttl pk-yrs)   Types: Cigarettes   Start date: 04/24/1962   Quit date: 04/25/1967   Years since quitting: 56.3  Smokeless Tobacco Never    Goals Met:  Proper associated with RPD/PD & O2 Sat Independence with exercise equipment Exercise tolerated well No report of concerns or symptoms today Strength training completed today  Goals Unmet:  Not Applicable  Comments: Service time is from 1315 to 1441.    Dr. Genetta Kenning is Medical Director for Pulmonary Rehab at [facility].

## 2023-08-14 ENCOUNTER — Encounter (HOSPITAL_COMMUNITY)
Admission: RE | Admit: 2023-08-14 | Discharge: 2023-08-14 | Disposition: A | Discharge status: Home / Self Care | Source: Ambulatory Visit | Attending: Pulmonary Disease

## 2023-08-14 DIAGNOSIS — J449 Chronic obstructive pulmonary disease, unspecified: Secondary | ICD-10-CM | POA: Diagnosis not present

## 2023-08-14 NOTE — Progress Notes (Signed)
 Daily Session Note  Patient Details  Name: Matthew Patrick MRN: [id number] Date of Birth: Aug 22, 1945 Referring Provider:   Gattis Kass Pulmonary Rehab Walk Test from 07/18/2023 in [facility] for Heart, Vascular, & Lung Health  Referring Provider Briones       Encounter Date: 08/14/2023  Check In:  Session Check In - 08/14/23 1440       Check-In   Supervising physician immediately available to respond to emergencies [facility] MD immediately available    Physician(s) Marlana Silvan, NP    [facility]    Staff Present Sueellen Emery BS, ACSM-CEP, Exercise Physiologist;Kaylee Nolon Baxter, MS, ACSM-CEP, Exercise Physiologist;Ayauna Mcnay Ena Harries, MS, Exercise Physiologist    Virtual Visit No    Medication changes reported     No    Fall or balance concerns reported    No    Tobacco Cessation No Change    Warm-up and Cool-down Performed as group-led instruction   warm up done, pt left before cool down   Resistance Training Performed Yes    VAD Patient? No    PAD/SET Patient? No      Pain Assessment   Currently in Pain? No/denies    Multiple Pain Sites No             Capillary Blood Glucose: No results found for this or any previous visit (from the past 24 hours).    Social History   Tobacco Use  Smoking Status Former   Current packs/day: 0.00   Average packs/day: 1 pack/day for 5.0 years (5.0 ttl pk-yrs)   Types: Cigarettes   Start date: 04/24/1962   Quit date: 04/25/1967   Years since quitting: 56.3  Smokeless Tobacco Never    Goals Met:  Proper associated with RPD/PD & O2 Sat Independence with exercise equipment Exercise tolerated well No report of concerns or symptoms today Strength training completed today  Goals Unmet:  Not Applicable  Comments: Service time is from 1318 to 1450.    Dr. Genetta Kenning is Medical Director for Pulmonary Rehab at [facility].

## 2023-08-14 NOTE — Progress Notes (Signed)
 Daily Session Note  Patient Details  Name: Matthew Patrick MRN: [id number] Date of Birth: 20-Mar-1946 Referring Provider:   Gattis Kass Pulmonary Rehab Walk Test from 07/18/2023 in [facility] for Heart, Vascular, & Lung Health  Referring Provider Briones       Encounter Date: 08/14/2023  Check In:  Session Check In - 08/14/23 1440       Check-In   Supervising physician immediately available to respond to emergencies [facility] MD immediately available    Physician(s) Marlana Silvan, NP    [facility]    Staff Present Sueellen Emery BS, ACSM-CEP, Exercise Physiologist;Rawson Minix Nolon Baxter, MS, ACSM-CEP, Exercise Physiologist;Casey Ena Harries, MS, Exercise Physiologist    Virtual Visit No    Medication changes reported     No    Fall or balance concerns reported    No    Tobacco Cessation No Change    Warm-up and Cool-down Performed as group-led instruction   warm up done, pt left before cool down   Resistance Training Performed Yes    VAD Patient? No    PAD/SET Patient? No      Pain Assessment   Currently in Pain? No/denies    Multiple Pain Sites No             Capillary Blood Glucose: No results found for this or any previous visit (from the past 24 hours).    Social History   Tobacco Use  Smoking Status Former   Current packs/day: 0.00   Average packs/day: 1 pack/day for 5.0 years (5.0 ttl pk-yrs)   Types: Cigarettes   Start date: 04/24/1962   Quit date: 04/25/1967   Years since quitting: 56.3  Smokeless Tobacco Never    Goals Met:  Proper associated with RPD/PD & O2 Sat Exercise tolerated well No report of concerns or symptoms today Strength training completed today  Goals Unmet:  Not Applicable  Comments: Service time is from 1310 to 1448.    Dr. Genetta Kenning is Medical Director for Pulmonary Rehab at [facility].

## 2023-08-16 ENCOUNTER — Encounter (HOSPITAL_COMMUNITY)
Admission: RE | Admit: 2023-08-16 | Discharge: 2023-08-16 | Disposition: A | Discharge status: Home / Self Care | Source: Ambulatory Visit | Attending: Pulmonary Disease | Admitting: Pulmonary Disease

## 2023-08-16 DIAGNOSIS — J449 Chronic obstructive pulmonary disease, unspecified: Secondary | ICD-10-CM | POA: Diagnosis not present

## 2023-08-16 NOTE — Progress Notes (Signed)
 Daily Session Note  Patient Details  Name: Matthew Patrick MRN: [id number] Date of Birth: 12/26/1945 Referring Provider:   Gattis Kass Pulmonary Rehab Walk Test from 07/18/2023 in [facility] Heart, Vascular, & Lung Health  Referring Provider Briones       Encounter Date: 08/16/2023  Check In:   Capillary Blood Glucose: No results found for this or any previous visit (from the past 24 hours).    Social History   Tobacco Use  Smoking Status Former   Current packs/day: 0.00   Average packs/day: 1 pack/day for 5.0 years (5.0 ttl pk-yrs)   Types: Cigarettes   Start date: 04/24/1962   Quit date: 04/25/1967   Years since quitting: 56.3  Smokeless Tobacco Never    Goals Met:  Proper associated with RPD/PD & O2 Sat Independence with exercise equipment Exercise tolerated well No report of concerns or symptoms today Strength training completed today  Goals Unmet:  Not Applicable  Comments: Service time is from 1312 to 1457.    Dr. Genetta Kenning is Medical Director for Pulmonary Rehab at [facility].

## 2023-08-21 ENCOUNTER — Encounter (HOSPITAL_COMMUNITY)
Admission: RE | Admit: 2023-08-21 | Discharge: 2023-08-21 | Disposition: A | Discharge status: Home / Self Care | Source: Ambulatory Visit | Attending: Pulmonary Disease

## 2023-08-21 VITALS — Wt 186.5 lb

## 2023-08-21 DIAGNOSIS — J449 Chronic obstructive pulmonary disease, unspecified: Secondary | ICD-10-CM

## 2023-08-21 NOTE — Progress Notes (Signed)
 Daily Session Note  Patient Details  Name: Matthew Patrick MRN: [id number] Date of Birth: 18-Mar-1946 Referring Provider:   Gattis Kass Pulmonary Rehab Walk Test from 07/18/2023 in [facility] for Heart, Vascular, & Lung Health  Referring Provider Briones       Encounter Date: 08/21/2023  Check In:  Session Check In - 08/21/23 1428       Check-In   Supervising physician immediately available to respond to emergencies [facility] MD immediately available    Physician(s) Lawana Pray, NP    [facility]    Staff Present Sueellen Emery BS, ACSM-CEP, Exercise Physiologist;Kaylee Nolon Baxter, MS, ACSM-CEP, Exercise Physiologist;Casey Ena Harries, MS, Exercise Physiologist;Raymondo Garcialopez Arlester Ladd, RN, BSN    Virtual Visit No    Medication changes reported     No    Fall or balance concerns reported    No    Tobacco Cessation No Change    Warm-up and Cool-down Performed as group-led instruction   warm up done, pt left before cool down   Resistance Training Performed Yes    VAD Patient? No    PAD/SET Patient? No      Pain Assessment   Currently in Pain? No/denies    Multiple Pain Sites No             Capillary Blood Glucose: No results found for this or any previous visit (from the past 24 hours).   Exercise Prescription Changes - 08/21/23 1500       Response to Exercise   Blood Pressure (Admit) 100/52    Blood Pressure (Exercise) 150/60    Blood Pressure (Exit) 140/60    Heart Rate (Admit) 59 bpm    Heart Rate (Exercise) 88 bpm    Heart Rate (Exit) 72 bpm    Oxygen Saturation (Admit) 97 %    Oxygen Saturation (Exercise) 96 %    Oxygen Saturation (Exit) 97 %    Rating of Perceived Exertion (Exercise) 11    Perceived Dyspnea (Exercise) 2    Duration Continue with 30 min of aerobic exercise without signs/symptoms of physical distress.    Intensity THRR unchanged      Progression   Progression Continue to progress workloads to maintain  intensity without signs/symptoms of physical distress.      Resistance Training   Training Prescription Yes    Weight blue bands    Reps 10-15    Time 10 Minutes      Bike   Level 3    Minutes 15    METs 3.1      Track   Laps 13    Minutes 15    METs 3             Social History   Tobacco Use  Smoking Status Former   Current packs/day: 0.00   Average packs/day: 1 pack/day for 5.0 years (5.0 ttl pk-yrs)   Types: Cigarettes   Start date: 04/24/1962   Quit date: 04/25/1967   Years since quitting: 56.3  Smokeless Tobacco Never    Goals Met:  Independence with exercise equipment Exercise tolerated well No report of concerns or symptoms today Strength training completed today  Goals Unmet:  Not Applicable  Comments: Service time is from 1320 to 1446    Dr. Genetta Kenning is Medical Director for Pulmonary Rehab at [facility].

## 2023-08-23 ENCOUNTER — Encounter (HOSPITAL_COMMUNITY)
Admission: RE | Admit: 2023-08-23 | Discharge: 2023-08-23 | Disposition: A | Discharge status: Home / Self Care | Source: Ambulatory Visit | Attending: Pulmonary Disease | Admitting: Pulmonary Disease

## 2023-08-23 DIAGNOSIS — M545 Low back pain, unspecified: Secondary | ICD-10-CM | POA: Insufficient documentation

## 2023-08-23 DIAGNOSIS — J449 Chronic obstructive pulmonary disease, unspecified: Secondary | ICD-10-CM

## 2023-08-23 NOTE — Progress Notes (Signed)
 Daily Session Note  Patient Details  Name: Matthew Patrick MRN: [id number] Date of Birth: 1946/01/01 Referring Provider:   Gattis Kass Pulmonary Rehab Walk Test from 07/18/2023 in [facility]  Referring Provider Briones       Encounter Date: 08/23/2023  Check In:  Session Check In - 08/23/23 1345       Check-In   Supervising physician immediately available to respond to emergencies [facility] MD immediately available    Physician(s) Koren Persons, NP    Location [facility]    Staff Present Sueellen Emery BS, ACSM-CEP, Exercise Physiologist;Sennie Borden Nolon Baxter, MS, ACSM-CEP, Exercise Physiologist;Casey Carmen Chol, RN, Mercedes Stake, RN, BSN    Virtual Visit No    Medication changes reported     No    Fall or balance concerns reported    No    Tobacco Cessation No Change    Warm-up and Cool-down Performed as group-led instruction   warm up done, pt left before cool down   Resistance Training Performed Yes    VAD Patient? No    PAD/SET Patient? No      Pain Assessment   Currently in Pain? No/denies    Multiple Pain Sites No             Capillary Blood Glucose: No results found for this or any previous visit (from the past 24 hours).    Social History   Tobacco Use  Smoking Status Former   Current packs/day: 0.00   Average packs/day: 1 pack/day for 5.0 years (5.0 ttl pk-yrs)   Types: Cigarettes   Start date: 04/24/1962   Quit date: 04/25/1967   Years since quitting: 56.3  Smokeless Tobacco Never    Goals Met:  Proper associated with RPD/PD & O2 Sat Independence with exercise equipment Exercise tolerated well No report of concerns or symptoms today Strength training completed today  Goals Unmet:  Not Applicable  Comments: Service time is from 1312 to 1435.    Dr. Genetta Kenning is Medical Director for Pulmonary Rehab at [facility].

## 2023-08-28 ENCOUNTER — Encounter (HOSPITAL_COMMUNITY)
Admission: RE | Admit: 2023-08-28 | Discharge: 2023-08-28 | Disposition: A | Discharge status: Home / Self Care | Source: Ambulatory Visit | Attending: Pulmonary Disease

## 2023-08-28 DIAGNOSIS — J449 Chronic obstructive pulmonary disease, unspecified: Secondary | ICD-10-CM

## 2023-08-28 NOTE — Progress Notes (Signed)
 Daily Session Note  Patient Details  Name: Matthew Patrick MRN: [id number] Date of Birth: 09-30-1945 Referring Provider:   Gattis Kass Pulmonary Rehab Walk Test from 07/18/2023 in [facility]  Referring Provider Briones       Encounter Date: 08/28/2023  Check In:  Session Check In - 08/28/23 1327       Check-In   Supervising physician immediately available to respond to emergencies [facility] MD immediately available    Physician(s) Theresia Flasher, NP    [facility]    Staff Present Sueellen Emery BS, ACSM-CEP, Exercise Physiologist;Giulia Hickey Nolon Baxter, MS, ACSM-CEP, Exercise Physiologist;Casey Carmen Chol, RN, BSN;Johnny Porter, MS, Exercise Physiologist    Virtual Visit No    Medication changes reported     No    Fall or balance concerns reported    No    Tobacco Cessation No Change    Warm-up and Cool-down Performed as group-led instruction    Resistance Training Performed Yes    VAD Patient? No    PAD/SET Patient? No      Pain Assessment   Currently in Pain? No/denies    Multiple Pain Sites No             Capillary Blood Glucose: No results found for this or any previous visit (from the past 24 hours).    Social History   Tobacco Use  Smoking Status Former   Current packs/day: 0.00   Average packs/day: 1 pack/day for 5.0 years (5.0 ttl pk-yrs)   Types: Cigarettes   Start date: 04/24/1962   Quit date: 04/25/1967   Years since quitting: 56.3  Smokeless Tobacco Never    Goals Met:  Proper associated with RPD/PD & O2 Sat Independence with exercise equipment Exercise tolerated well No report of concerns or symptoms today Strength training completed today  Goals Unmet:  Not Applicable  Comments: Service time is from 1312 to 1442.    Dr. Genetta Kenning is Medical Director for Pulmonary Rehab at [facility].

## 2023-08-30 ENCOUNTER — Encounter (HOSPITAL_COMMUNITY): Admission: RE | Admit: 2023-08-30 | Source: Ambulatory Visit

## 2023-09-04 ENCOUNTER — Encounter (HOSPITAL_COMMUNITY)
Admission: RE | Admit: 2023-09-04 | Discharge: 2023-09-04 | Disposition: A | Discharge status: Home / Self Care | Source: Ambulatory Visit | Attending: Pulmonary Disease | Admitting: Pulmonary Disease

## 2023-09-04 VITALS — Wt 188.3 lb

## 2023-09-04 DIAGNOSIS — J449 Chronic obstructive pulmonary disease, unspecified: Secondary | ICD-10-CM | POA: Diagnosis not present

## 2023-09-04 NOTE — Progress Notes (Signed)
 Daily Session Note  Patient Details  Name: Matthew Patrick MRN: [id number] Date of Birth: [birth year] Referring Provider:   Gattis Kass Pulmonary Rehab Walk Test from 07/18/2023 in [facility]  Referring Provider Briones       Encounter Date: 09/04/2023  Check In:  Session Check In - 09/04/23 1405       Check-In   Supervising physician immediately available to respond to emergencies [facility] MD immediately available    Physician(s) Morey Ar, NP    [facility]    Staff Present Sueellen Emery BS, ACSM-CEP, Exercise Physiologist;Aliceson Dolbow Nolon Baxter, MS, ACSM-CEP, Exercise Physiologist;Casey Carmen Chol, RN, BSN    Virtual Visit No    Medication changes reported     No    Fall or balance concerns reported    No    Tobacco Cessation No Change    Warm-up and Cool-down Performed as group-led instruction    Resistance Training Performed Yes    VAD Patient? No    PAD/SET Patient? No      Pain Assessment   Currently in Pain? No/denies    Multiple Pain Sites No             Capillary Blood Glucose: No results found for this or any previous visit (from the past 24 hours).   Exercise Prescription Changes - 09/04/23 1400       Response to Exercise   Blood Pressure (Admit) 108/60    Blood Pressure (Exercise) 132/60    Blood Pressure (Exit) 122/60    Heart Rate (Admit) 60 bpm    Heart Rate (Exercise) 78 bpm    Heart Rate (Exit) 65 bpm    Oxygen Saturation (Admit) 94 %    Oxygen Saturation (Exercise) 94 %    Oxygen Saturation (Exit) 95 %    Rating of Perceived Exertion (Exercise) 9    Perceived Dyspnea (Exercise) 1    Duration Continue with 30 min of aerobic exercise without signs/symptoms of physical distress.    Intensity THRR unchanged      Progression   Progression Continue to progress workloads to maintain intensity without signs/symptoms of physical distress.      Resistance  Training   Training Prescription Yes    Weight blue bands    Reps 10-15    Time 10 Minutes      Bike   Level 4    Minutes 15    METs 3.2      Track   Laps 12    Minutes 15             Social History   Tobacco Use  Smoking Status Former   Current packs/day: 0.00   Average packs/day: 1 pack/day for 5.0 years (5.0 ttl pk-yrs)   Types: Cigarettes   Start date: 04/24/1962   Quit date: 04/25/1967   Years since quitting: 56.4  Smokeless Tobacco Never    Goals Met:  Proper associated with RPD/PD & O2 Sat Independence with exercise equipment Exercise tolerated well No report of concerns or symptoms today Strength training completed today  Goals Unmet:  Not Applicable  Comments: Service time is from 1312 to 1432.    Dr. Genetta Kenning is Medical Director for Pulmonary Rehab at [facility].

## 2023-09-05 NOTE — Progress Notes (Signed)
 Pulmonary Individual Treatment Plan  Patient Details  Name: Matthew Patrick MRN: [id number] Date of Birth: May 31, 1945 Referring Provider:   Gattis Kass Pulmonary Rehab Walk Test from 07/18/2023 in [facility]  Referring Provider Briones       Initial Encounter Date:  Flowsheet Row Pulmonary Rehab Walk Test from 07/18/2023 in [facility] for [facility], [facility], [facility] Lung [facility]  Date 07/18/23       Visit Diagnosis: Stage 2 moderate COPD by GOLD classification (HCC)  Patient's Home Medications on Admission:   Current Outpatient Medications:    Acetaminophen  (TYLENOL  PO), Take 2 tablets by mouth daily as needed (pain)., Disp: , Rfl:    Albuterol  Sulfate (PROAIR  RESPICLICK) 108 (90 Base) MCG/ACT AEPB, Inhale into the lungs., Disp: , Rfl:    alprostadil (EDEX) 20 MCG injection, by Intracavernosal route., Disp: , Rfl:    amLODipine (NORVASC) 5 MG tablet, Take 5 mg by mouth every morning., Disp: , Rfl:    aspirin  81 MG tablet, Take 81 mg by mouth daily., Disp: , Rfl:    azelastine (ASTELIN) 0.1 % nasal spray, Place 2 sprays into the nose 2 (two) times daily., Disp: , Rfl:    busPIRone (BUSPAR) 15 MG tablet, Take 15 mg by mouth 3 (three) times daily., Disp: , Rfl:    escitalopram (LEXAPRO) 10 MG tablet, Take 10 mg by mouth daily., Disp: , Rfl:    fluticasone  furoate-vilanterol (BREO ELLIPTA ) 100-25 MCG/INH AEPB, Inhale 1 puff into the lungs daily., Disp: 1 each, Rfl: 5   galantamine (RAZADYNE ER) 8 MG 24 hr capsule, Take 8 mg by mouth daily with breakfast., Disp: , Rfl:    ibuprofen  (ADVIL ) 600 MG tablet, Take 600 mg by mouth every 8 (eight) hours as needed for moderate pain (pain score 4-6)., Disp: , Rfl:    latanoprost (XALATAN) 0.005 % ophthalmic solution, Place 1 drop into both eyes at bedtime., Disp: , Rfl:    magnesium  oxide (MAG-OX) 400 (240 Mg) MG tablet, Take 420 mg by mouth 2 (two) times daily. Take for leg  cramping, Disp: , Rfl:    meclizine  (ANTIVERT ) 25 MG tablet, Take 12.5 mg by mouth 3 (three) times daily as needed for dizziness., Disp: , Rfl:    memantine (NAMENDA) 10 MG tablet, Take 20 mg by mouth 2 (two) times daily., Disp: , Rfl:    Misc Natural Products (PROSTATE HEALTH) CAPS, Take 1 capsule by mouth daily., Disp: , Rfl:    mometasone (ASMANEX) 220 MCG/ACT inhaler, Inhale 1 puff into the lungs daily., Disp: , Rfl:    Multiple Vitamins-Minerals (EYE HEALTH PO), Take 1 tablet by mouth daily., Disp: , Rfl:    pramipexole (MIRAPEX) 0.125 MG tablet, Take 0.125 mg by mouth 4 (four) times daily. Aaron Aas125 mg three times daily and at bedtime, Disp: , Rfl:    ropinirole (REQUIP) 5 MG tablet, Take 1 tablet by mouth 3 (three) times daily. 5 mg twice daily and 10 mg at bedtime, Disp: , Rfl:    rosuvastatin  (CRESTOR ) 10 MG tablet, Take 10 mg by mouth daily., Disp: , Rfl:    tamsulosin (FLOMAX) 0.4 MG CAPS capsule, Take 0.8 mg by mouth at bedtime. , Disp: , Rfl:    temazepam (RESTORIL) 7.5 MG capsule, Take 7.5 mg by mouth at bedtime as needed for sleep., Disp: , Rfl:    TIOTROPIUM BROMIDE -OLODATEROL IN, Inhale 2.5 mcg into the lungs daily. 2 puffs/daily, Disp: , Rfl:  traZODone (DESYREL) 50 MG tablet, Take 50 mg by mouth at bedtime., Disp: , Rfl:    valproic acid (DEPAKENE) 250 MG capsule, Take 750 mg by mouth at bedtime., Disp: , Rfl:    vardenafil (LEVITRA) 20 MG tablet, Take 20 mg by mouth daily as needed for erectile dysfunction., Disp: , Rfl:    vitamin B-12 (CYANOCOBALAMIN) 500 MCG tablet, Take 500 mcg by mouth 2 (two) times daily. (Patient not taking: Reported on 07/18/2023), Disp: , Rfl:   Past Medical History: Past Medical History:  Diagnosis Date   Abnormal findings on diagnostic imaging of lung    Accidental exposure to phenoxyacid derivative herbicide    Actinic keratosis    Allergic rhinitis    Anxiety    Asthma    Benign prostatic hyperplasia    Bipolar affective disorder, mixed,  severe, with psychotic behavior (HCC)    Cognitive impairment    Depression    Drug induced akathisia    Dysuria    Exposure to potentially hazardous substance    Functional diarrhea    Hyperlipemia    Hypertension    Insomnia    Lumbago 06/27/2016   Major depressive disorder    Parkinson disease (HCC)    Restless leg syndrome    Sleep apnea    has c-pap    Tremor due to disorder of central nervous system    Vascular dementia without behavioral disturbance (HCC)     Tobacco Use: Social History   Tobacco Use  Smoking Status Former   Current packs/day: 0.00   Average packs/day: 1 pack/day for 5.0 years (5.0 ttl pk-yrs)   Types: Cigarettes   Start date: 04/24/1962   Quit date: 04/25/1967   Years since quitting: 56.4  Smokeless Tobacco Never    Labs: Review Flowsheet       Latest Ref Rng & Units 03/26/2011 09/17/2015 04/10/2016 01/11/2017  Labs for ITP Cardiac and Pulmonary Rehab  Cholestrol 0 - 200 mg/dL 161  096  045  - -  LDL (calc) 0 - 99 mg/dL 43  46  87  - -  HDL-C >40 mg/dL 45  44  58  - -  Trlycerides <150 mg/dL 409  811  914  - -  Hemoglobin A1c 4.0 - 6.0 % 5.7  5.7  - -  TCO2 22 - 32 mmol/L - - 28  27     Details       Multiple values from one day are sorted in reverse-chronological order         Capillary Blood Glucose: Lab Results  Component Value Date   GLUCAP 89 06/07/2021   GLUCAP 79 04/29/2018   GLUCAP 90 01/11/2017     Pulmonary Assessment Scores:  Pulmonary Assessment Scores     Row Name 07/18/23 1323 07/18/23 1342       ADL UCSD   ADL Phase -- Entry    SOB Score total -- 38      CAT Score   CAT Score -- 19      mMRC Score   mMRC Score -- 1            UCSD: Self-administered rating of dyspnea associated with activities of daily living (ADLs) 6-point scale (0 = "not at all" to 5 = "maximal or unable to do because of breathlessness")  Scoring Scores range from 0 to 120.  Minimally important difference is 5  units  CAT: CAT can identify the health impairment of COPD patients and is  better correlated with disease progression.  CAT has a scoring range of zero to 40. The CAT score is classified into four groups of low (less than 10), medium (10 - 20), high (21-30) and very high (31-40) based on the impact level of disease on health status. A CAT score over 10 suggests significant symptoms.  A worsening CAT score could be explained by an exacerbation, poor medication adherence, poor inhaler technique, or progression of COPD or comorbid conditions.  CAT MCID is 2 points  mMRC: mMRC (Modified Medical Research Council) Dyspnea Scale is used to assess the degree of baseline functional disability in patients of respiratory disease due to dyspnea. No minimal important difference is established. A decrease in score of 1 point or greater is considered a positive change.   Pulmonary Function Assessment:  Pulmonary Function Assessment - 07/18/23 1342       Breath   Bilateral Breath Sounds Clear    Shortness of Breath Yes;Limiting activity;Fear of Shortness of Breath             Exercise Target Goals: Exercise Program Goal: Individual exercise prescription set using results from initial 6 min walk test and THRR while considering  patient's activity barriers and safety.   Exercise Prescription Goal: Initial exercise prescription builds to 30-45 minutes a day of aerobic activity, 2-3 days per week.  Home exercise guidelines will be given to patient during program as part of exercise prescription that the participant will acknowledge.  Activity Barriers & Risk Stratification:  Activity Barriers & Cardiac Risk Stratification - 07/18/23 1328       Activity Barriers & Cardiac Risk Stratification   Activity Barriers Muscular Weakness;Shortness of Breath;Deconditioning             6 Minute Walk:  6 Minute Walk     Row Name 07/18/23 1416         6 Minute Walk   Phase Initial     Distance  1510 feet     Walk Time 6 minutes     # of Rest Breaks 0     MPH 2.86     METS 3.43     RPE 13     Perceived Dyspnea  2     VO2 Peak 12.01     Symptoms No     Resting HR 75 bpm     Resting BP 122/60     Resting Oxygen Saturation  95 %     Exercise Oxygen Saturation  during 6 min walk 95 %     Max Ex. HR 2.66 bpm     Max Ex. BP 186/70     2 Minute Post BP 150/70       Interval HR   1 Minute HR 97     2 Minute HR 103     3 Minute HR 103     4 Minute HR 104     5 Minute HR 108     6 Minute HR 110     2 Minute Post HR 93     Interval Heart Rate? Yes       Interval Oxygen   Interval Oxygen? Yes     Baseline Oxygen Saturation % 95 %     1 Minute Oxygen Saturation % 97 %     1 Minute Liters of Oxygen 0 L     2 Minute Oxygen Saturation % 95 %     2 Minute Liters of Oxygen 0 L  3 Minute Oxygen Saturation % 96 %     3 Minute Liters of Oxygen 0 L     4 Minute Oxygen Saturation % 96 %     4 Minute Liters of Oxygen 0 L     5 Minute Oxygen Saturation % 96 %     5 Minute Liters of Oxygen 0 L     6 Minute Oxygen Saturation % 96 %     6 Minute Liters of Oxygen 0 L     2 Minute Post Oxygen Saturation % 97 %     2 Minute Post Liters of Oxygen 0 L              Oxygen Initial Assessment:  Oxygen Initial Assessment - 07/18/23 1321       Home Oxygen   Home Oxygen Device None    Sleep Oxygen Prescription CPAP    Home Exercise Oxygen Prescription None    Home Resting Oxygen Prescription None      Initial 6 min Walk   Oxygen Used None      Program Oxygen Prescription   Program Oxygen Prescription None      Intervention   Short Term Goals To learn and understand importance of maintaining oxygen saturations>88%;To learn and understand importance of monitoring SPO2 with pulse oximeter and demonstrate accurate use of the pulse oximeter.;To learn and demonstrate proper pursed lip breathing techniques or other breathing techniques.     Long  Term Goals Exhibits proper  breathing techniques, such as pursed lip breathing or other method taught during program session;Verbalizes importance of monitoring SPO2 with pulse oximeter and return demonstration;Compliance with respiratory medication;Maintenance of O2 saturations>88%             Oxygen Re-Evaluation:  Oxygen Re-Evaluation     Row Name 08/03/23 0741 08/31/23 1403           Program Oxygen Prescription   Program Oxygen Prescription None None        Home Oxygen   Home Oxygen Device None None      Sleep Oxygen Prescription CPAP CPAP      Home Exercise Oxygen Prescription None None      Home Resting Oxygen Prescription None None        Goals/Expected Outcomes   Short Term Goals To learn and understand importance of maintaining oxygen saturations>88%;To learn and understand importance of monitoring SPO2 with pulse oximeter and demonstrate accurate use of the pulse oximeter.;To learn and demonstrate proper pursed lip breathing techniques or other breathing techniques.  To learn and understand importance of maintaining oxygen saturations>88%;To learn and understand importance of monitoring SPO2 with pulse oximeter and demonstrate accurate use of the pulse oximeter.;To learn and demonstrate proper pursed lip breathing techniques or other breathing techniques.       Long  Term Goals Exhibits proper breathing techniques, such as pursed lip breathing or other method taught during program session;Verbalizes importance of monitoring SPO2 with pulse oximeter and return demonstration;Compliance with respiratory medication;Maintenance of O2 saturations>88% Exhibits proper breathing techniques, such as pursed lip breathing or other method taught during program session;Verbalizes importance of monitoring SPO2 with pulse oximeter and return demonstration;Compliance with respiratory medication;Maintenance of O2 saturations>88%      Goals/Expected Outcomes Compliance and understanding of oxygen saturation monitoring and  breathing techniques to decrease shortness of breath. Compliance and understanding of oxygen saturation monitoring and breathing techniques to decrease shortness of breath.  Oxygen Discharge (Final Oxygen Re-Evaluation):  Oxygen Re-Evaluation - 08/31/23 1403       Program Oxygen Prescription   Program Oxygen Prescription None      Home Oxygen   Home Oxygen Device None    Sleep Oxygen Prescription CPAP    Home Exercise Oxygen Prescription None    Home Resting Oxygen Prescription None      Goals/Expected Outcomes   Short Term Goals To learn and understand importance of maintaining oxygen saturations>88%;To learn and understand importance of monitoring SPO2 with pulse oximeter and demonstrate accurate use of the pulse oximeter.;To learn and demonstrate proper pursed lip breathing techniques or other breathing techniques.     Long  Term Goals Exhibits proper breathing techniques, such as pursed lip breathing or other method taught during program session;Verbalizes importance of monitoring SPO2 with pulse oximeter and return demonstration;Compliance with respiratory medication;Maintenance of O2 saturations>88%    Goals/Expected Outcomes Compliance and understanding of oxygen saturation monitoring and breathing techniques to decrease shortness of breath.             Initial Exercise Prescription:  Initial Exercise Prescription - 07/18/23 1400       Date of Initial Exercise RX and Referring Provider   Date 07/18/23    Referring Provider Briones    Expected Discharge Date 10/11/23      Treadmill   MPH 2    Grade 0    Minutes 15    METs 2.5      Bike   Level 3    Minutes 15    METs 3      Prescription Details   Frequency (times per week) 2    Duration Progress to 30 minutes of continuous aerobic without signs/symptoms of physical distress      Intensity   THRR 40-80% of Max Heartrate 57-114    Ratings of Perceived Exertion 11-13    Perceived Dyspnea 0-4       Progression   Progression Continue to progress workloads to maintain intensity without signs/symptoms of physical distress.      Resistance Training   Training Prescription Yes    Weight --   black bands   Reps 10-15             Perform Capillary Blood Glucose checks as needed.  Exercise Prescription Changes:   Exercise Prescription Changes     Row Name 07/24/23 1500 08/07/23 1500 08/21/23 1500 09/04/23 1400       Response to Exercise   Blood Pressure (Admit) 130/68 108/58 100/52 108/60    Blood Pressure (Exercise) 180/70 150/60 150/60 132/60    Blood Pressure (Exit) 140/64 102/50 140/60 122/60    Heart Rate (Admit) 67 bpm 65 bpm 59 bpm 60 bpm    Heart Rate (Exercise) 101 bpm 84 bpm 88 bpm 78 bpm    Heart Rate (Exit) 80 bpm 71 bpm 72 bpm 65 bpm    Oxygen Saturation (Admit) 96 % 94 % 97 % 94 %    Oxygen Saturation (Exercise) 95 % 93 % 96 % 94 %    Oxygen Saturation (Exit) 95 % 94 % 97 % 95 %    Rating of Perceived Exertion (Exercise) 12 11 11 9     Perceived Dyspnea (Exercise) 2 1 2 1     Duration Continue with 30 min of aerobic exercise without signs/symptoms of physical distress. Continue with 30 min of aerobic exercise without signs/symptoms of physical distress. Continue with 30 min of aerobic exercise without signs/symptoms of  physical distress. Continue with 30 min of aerobic exercise without signs/symptoms of physical distress.    Intensity THRR unchanged THRR unchanged THRR unchanged THRR unchanged      Progression   Progression Continue to progress workloads to maintain intensity without signs/symptoms of physical distress. Continue to progress workloads to maintain intensity without signs/symptoms of physical distress. Continue to progress workloads to maintain intensity without signs/symptoms of physical distress. Continue to progress workloads to maintain intensity without signs/symptoms of physical distress.      Resistance Training   Training Prescription  Yes Yes Yes Yes    Weight blue bands blue bands blue bands blue bands    Reps 10-15 10-15 10-15 10-15    Time 10 Minutes 10 Minutes 10 Minutes 10 Minutes      Bike   Level 3 3 3 4     Minutes 15 15 15 15     METs 3 3.4 3.1 3.2      Track   Laps 7 12 13 12     Minutes 10 15 15 15     METs 2.6 2.85 3 --             Exercise Comments:   Exercise Comments     Row Name 07/24/23 1513           Exercise Comments Pt completed his first day of group exercise. He attempted to walk the treadmill however he felt exerted and dizzy after a few minutes. Therefore walked the track for 10 min, METs 2.6, tolerated well. Will continue on track. He then exercised on the scifit bike on level 3, METs 3, for 15 min. Tolerated well. Performed warm up and cool down standing without limitations. Discussed METs.                Exercise Goals and Review:   Exercise Goals     Row Name 07/18/23 1321             Exercise Goals   Increase Physical Activity Yes       Intervention Provide advice, education, support and counseling about physical activity/exercise needs.;Develop an individualized exercise prescription for aerobic and resistive training based on initial evaluation findings, risk stratification, comorbidities and participant's personal goals.       Expected Outcomes Short Term: Attend rehab on a regular basis to increase amount of physical activity.;Long Term: Exercising regularly at least 3-5 days a week.;Long Term: Add in home exercise to make exercise part of routine and to increase amount of physical activity.       Increase Strength and Stamina Yes       Intervention Provide advice, education, support and counseling about physical activity/exercise needs.;Develop an individualized exercise prescription for aerobic and resistive training based on initial evaluation findings, risk stratification, comorbidities and participant's personal goals.       Expected Outcomes Short Term:  Increase workloads from initial exercise prescription for resistance, speed, and METs.;Short Term: Perform resistance training exercises routinely during rehab and add in resistance training at home;Long Term: Improve cardiorespiratory fitness, muscular endurance and strength as measured by increased METs and functional capacity ( )       Able to understand and use rate of perceived exertion (RPE) scale Yes       Intervention Provide education and explanation on how to use RPE scale       Expected Outcomes Short Term: Able to use RPE daily in rehab to express subjective intensity level;Long Term:  Able to use RPE to guide  intensity level when exercising independently       Able to understand and use Dyspnea scale Yes       Intervention Provide education and explanation on how to use Dyspnea scale       Expected Outcomes Short Term: Able to use Dyspnea scale daily in rehab to express subjective sense of shortness of breath during exertion;Long Term: Able to use Dyspnea scale to guide intensity level when exercising independently       Knowledge and understanding of Target Heart Rate Range (THRR) Yes       Intervention Provide education and explanation of THRR including how the numbers were predicted and where they are located for reference       Expected Outcomes Short Term: Able to state/look up THRR;Short Term: Able to use daily as guideline for intensity in rehab;Long Term: Able to use THRR to govern intensity when exercising independently       Understanding of Exercise Prescription Yes       Intervention Provide education, explanation, and written materials on patient's individual exercise prescription       Expected Outcomes Short Term: Able to explain program exercise prescription;Long Term: Able to explain home exercise prescription to exercise independently                Exercise Goals Re-Evaluation :  Exercise Goals Re-Evaluation     Row Name 08/03/23 0739 08/31/23 1403            Exercise Goal Re-Evaluation   Exercise Goals Review Increase Physical Activity;Able to understand and use Dyspnea scale;Understanding of Exercise Prescription;Increase Strength and Stamina;Knowledge and understanding of Target Heart Rate Range (THRR);Able to understand and use rate of perceived exertion (RPE) scale Increase Physical Activity;Able to understand and use Dyspnea scale;Understanding of Exercise Prescription;Increase Strength and Stamina;Knowledge and understanding of Target Heart Rate Range (THRR);Able to understand and use rate of perceived exertion (RPE) scale      Comments Pt has completed 2 1/2 exercise sessions. He needed to leave the last class early due to significant SOB. He missed the last session for a MD appt. Pt is walking the track for 15 min, METs 3 at the most. He then is exercising on the scifit bike for 15 min, level 3, METs 3.3. He is motivated. Performs warm up and cool down without limitations, black bands. Will progress as tolerated. Pt has completed 10 exercise sessions, missing 2 for appointments.  Pt is walking the track for 15 min, METs 3.15. He then is exercising on the scifit bike for 15 min, level 4, METs 3.4. He is motivated. Will discuss moving to the treadmill although he felt insecure the first attempt. Performs warm up and cool down without limitations, black bands. Will progress as tolerated.      Expected Outcomes Through exercise at rehab and at home, patient will increase physical capacity and ADL's will be easier to perform. The patient will also feel comfortable establishing a home exercise program. Through exercise at rehab and at home, patient will increase physical capacity and ADL's will be easier to perform. The patient will also feel comfortable establishing a home exercise program.               Discharge Exercise Prescription (Final Exercise Prescription Changes):  Exercise Prescription Changes - 09/04/23 1400       Response to Exercise    Blood Pressure (Admit) 108/60    Blood Pressure (Exercise) 132/60    Blood Pressure (Exit) 122/60  Heart Rate (Admit) 60 bpm    Heart Rate (Exercise) 78 bpm    Heart Rate (Exit) 65 bpm    Oxygen Saturation (Admit) 94 %    Oxygen Saturation (Exercise) 94 %    Oxygen Saturation (Exit) 95 %    Rating of Perceived Exertion (Exercise) 9    Perceived Dyspnea (Exercise) 1    Duration Continue with 30 min of aerobic exercise without signs/symptoms of physical distress.    Intensity THRR unchanged      Progression   Progression Continue to progress workloads to maintain intensity without signs/symptoms of physical distress.      Resistance Training   Training Prescription Yes    Weight blue bands    Reps 10-15    Time 10 Minutes      Bike   Level 4    Minutes 15    METs 3.2      Track   Laps 12    Minutes 15             Nutrition:  Target Goals: Understanding of nutrition guidelines, daily intake of sodium 1500mg , cholesterol 200mg , calories 30% from fat and 7% or less from saturated fats, daily to have 5 or more servings of fruits and vegetables.  Biometrics:  Pre Biometrics - 07/18/23 1423       Pre Biometrics   Grip Strength 29 kg              Nutrition Therapy Plan and Nutrition Goals:  Nutrition Therapy & Goals - 08/28/23 1358       Nutrition Therapy   Diet General Healthy Diet    Drug/Food Interactions Statins/Certain Fruits      Personal Nutrition Goals   Nutrition Goal Patient to improve diet quality by using the plate method as a guide for meal planning to include lean protein/plant protein, fruits, vegetables, whole grains, nonfat dairy as part of a well-balanced diet.   goal in progress.   Personal Goal #2 Patient to identify strategies for weight loss of 0.5-2.0# per week.   goal not met.   Comments Goals in progress. Marquiz has medical history of HTN, OSA, COPD. Bladyn reports motivation to lose weight; we have discussed multiple strategies for  creating calorie deficit including calorie density, the plate method as aguide for meal planning, decreasing snacking, etc. Patient has not lost weight but maintained weight since starting with our program. Patient will benefit from participation in pulmonary rehab for nutrition, exercise, and lifestyle modification.      Intervention Plan   Intervention Prescribe, educate and counsel regarding individualized specific dietary modifications aiming towards targeted core components such as weight, hypertension, lipid management, diabetes, heart failure and other comorbidities.;Nutrition handout(s) given to patient.    Expected Outcomes Short Term Goal: Understand basic principles of dietary content, such as calories, fat, sodium, cholesterol and nutrients.;Long Term Goal: Adherence to prescribed nutrition plan.             Nutrition Assessments:  MEDIFICTS Score Key: >=70 Need to make dietary changes  40-70 Heart Healthy Diet <= 40 Therapeutic Level Cholesterol Diet   Picture Your Plate Scores: <45 Unhealthy dietary pattern with much room for improvement. 41-50 Dietary pattern unlikely to meet recommendations for good health and room for improvement. 51-60 More healthful dietary pattern, with some room for improvement.  >60 Healthy dietary pattern, although there may be some specific behaviors that could be improved.    Nutrition Goals Re-Evaluation:  Nutrition Goals Re-Evaluation  Row Name 07/24/23 1513 08/28/23 1358           Goals   Current Weight 187 lb 13.3 oz (85.2 kg) 188 lb 0.8 oz (85.3 kg)      Comment labs WNL no new labs; most recent labs show  labs WNL      Expected Outcome Rahsean has medical history of HTN, OSA, COPD. Laquan reports motivation to lose weight; will continue to discuss strategies for creating calorie deficit including calorie density, the plate method as aguide for meal planning, etc. Patient will benefit from participation in pulmonary rehab for nutrition,  exercise, and lifestyle modification. Goals in progress. Evelyn has medical history of HTN, OSA, COPD. Durell reports motivation to lose weight; we have discussed multiple strategies for creating calorie deficit including calorie density, the plate method as aguide for meal planning, decreasing snacking, etc. Patient has not lost weight but maintained weight since starting with our program. Patient will benefit from participation in pulmonary rehab for nutrition, exercise, and lifestyle modification.               Nutrition Goals Discharge (Final Nutrition Goals Re-Evaluation):  Nutrition Goals Re-Evaluation - 08/28/23 1358       Goals   Current Weight 188 lb 0.8 oz (85.3 kg)    Comment no new labs; most recent labs show  labs WNL    Expected Outcome Goals in progress. Moustapha has medical history of HTN, OSA, COPD. Dominic reports motivation to lose weight; we have discussed multiple strategies for creating calorie deficit including calorie density, the plate method as aguide for meal planning, decreasing snacking, etc. Patient has not lost weight but maintained weight since starting with our program. Patient will benefit from participation in pulmonary rehab for nutrition, exercise, and lifestyle modification.             Psychosocial: Target Goals: Acknowledge presence or absence of significant depression and/or stress, maximize coping skills, provide positive support system. Participant is able to verbalize types and ability to use techniques and skills needed for reducing stress and depression.  Initial Review & Psychosocial Screening:  Initial Psych Review & Screening - 07/18/23 1323       Initial Review   Current issues with Current Depression;History of Depression;Current Anxiety/Panic;Current Psychotropic Meds;Current Sleep Concerns      Family Dynamics   Good Support System? Yes    Comments supportive wife      Barriers   Psychosocial barriers to participate in program The patient  should benefit from training in stress management and relaxation.;Psychosocial barriers identified (see note)      Screening Interventions   Interventions Encouraged to exercise;Provide feedback about the scores to participant;To provide support and resources with identified psychosocial needs    Expected Outcomes Short Term goal: Utilizing psychosocial counselor, staff and physician to assist with identification of specific Stressors or current issues interfering with healing process. Setting desired goal for each stressor or current issue identified.;Long Term Goal: Stressors or current issues are controlled or eliminated.;Short Term goal: Identification and review with participant of any Quality of Life or Depression concerns found by scoring the questionnaire.;Long Term goal: The participant improves quality of Life and PHQ9 Scores as seen by post scores and/or verbalization of changes             Quality of Life Scores:  Scores of 19 and below usually indicate a poorer quality of life in these areas.  A difference of  2-3 points is a clinically meaningful  difference.  A difference of 2-3 points in the total score of the Quality of Life Index has been associated with significant improvement in overall quality of life, self-image, physical symptoms, and general health in studies assessing change in quality of life.  PHQ-9: Review Flowsheet       07/18/2023 11/19/2017  Depression screen PHQ 2/9  Decreased Interest 2 3 3   Down, Depressed, Hopeless 0 3 3  PHQ - 2 Score 2 6 6   Altered sleeping 1 3 3   Tired, decreased energy 1 3 3   Change in appetite 0 0 0  Feeling bad or failure about yourself  0 3 3  Trouble concentrating 0 3 3  Moving slowly or fidgety/restless 0 3 3  Suicidal thoughts 0 0 0  PHQ-9 Score 4 21 21   Difficult doing work/chores Not difficult at all Extremely dIfficult Very difficult    Details       Multiple values from one day are sorted in reverse-chronological  order        Interpretation of Total Score  Total Score Depression Severity:  1-4 = Minimal depression, 5-9 = Mild depression, 10-14 = Moderate depression, 15-19 = Moderately severe depression, 20-27 = Severe depression   Psychosocial Evaluation and Intervention:  Psychosocial Evaluation - 07/18/23 1325       Psychosocial Evaluation & Interventions   Interventions Encouraged to exercise with the program and follow exercise prescription    Comments Pt is a veteran and has a history of depression, anxiety and PTSD. In the past, pt has been hospitalized for mental health and received ECT treatments in the past. He regularly has therapy appointments and sees his psychiatrist for med management. He states that his mental health is stable and denies any needs or resources at this time.    Expected Outcomes For Fenix to have decreased symptoms of stress, anxiety, and depression. [facility].    Continue Psychosocial Services  Follow up required by staff             Psychosocial Re-Evaluation:  Psychosocial Re-Evaluation     Row Name 08/01/23 0943 08/31/23 1205           Psychosocial Re-Evaluation   Current issues with Current Depression;History of Depression;Current Anxiety/Panic;Current Psychotropic Meds;Current Sleep Concerns;Current Stress Concerns History of Depression;Current Psychotropic Meds      Comments Pt has an extensive mental health history. He is under the care of the [place] and sees a therapist and psychiatrist regularly. He is compliant with taking his psychotropic meds. [place] denies any additional needs or resources currently. Pt has an extensive mental health history. He is under the care of the [place] and sees a therapist and psychiatrist regularly. He is compliant with taking his psychotropic meds. Davarion denies any additional needs or resources currently.      Expected Outcomes For Christoher to have decreased symptoms of stress, anxiety, and  depression. [facility] and [facility]. For Darrin to have decreased symptoms of stress, anxiety, and depression. [facility] and be [facility].      Interventions Encouraged to attend Pulmonary Rehabilitation for the exercise;Relaxation education;Stress management education Encouraged to attend Pulmonary Rehabilitation for the exercise      Continue Psychosocial Services  Follow up required by staff No Follow up required               Psychosocial Discharge (Final Psychosocial Re-Evaluation):  Psychosocial  Re-Evaluation - 08/31/23 1205       Psychosocial Re-Evaluation   Current issues with History of Depression;Current Psychotropic Meds    Comments Pt has an extensive mental health history. He is under the care of the [place] and sees a therapist and psychiatrist regularly. He is compliant with taking his psychotropic meds. Swayde denies any additional needs or resources currently.    Expected Outcomes For [place] to have decreased symptoms of stress, anxiety, and depression. [facility] and [facility].    Interventions Encouraged to attend Pulmonary Rehabilitation for the exercise    Continue Psychosocial Services  No Follow up required             Education: Education Goals: Education classes will be provided on a weekly basis, covering required topics. Participant will state understanding/return demonstration of topics presented.  Learning Barriers/Preferences:  Learning Barriers/Preferences - 07/18/23 1325       Learning Barriers/Preferences   Learning Barriers Sight    Learning Preferences Group Instruction;Verbal Instruction;Written Material             Education Topics: Know Your Numbers Group instruction that is supported by a PowerPoint presentation. Instructor discusses importance of knowing and understanding resting, exercise, and post-exercise oxygen saturation, heart rate, and blood pressure.  Oxygen saturation, heart rate, blood pressure, rating of perceived exertion, and dyspnea are reviewed along with a normal range for these values.  Flowsheet Row PULMONARY REHAB CHRONIC OBSTRUCTIVE PULMONARY DISEASE from 07/26/2023 in [facility]  Date 07/26/23  Educator EP  Instruction Review Code 1- Verbalizes Understanding       Exercise for the Pulmonary Patient Group instruction that is supported by a PowerPoint presentation. Instructor discusses benefits of exercise, core components of exercise, frequency, duration, and intensity of an exercise routine, importance of utilizing pulse oximetry during exercise, safety while exercising, and options of places to exercise outside of rehab.    MET Level  Group instruction provided by PowerPoint, verbal discussion, and written material to support subject matter. Instructor reviews what METs are and how to increase METs.    Pulmonary Medications Verbally interactive group education provided by instructor with focus on inhaled medications and proper administration.   Anatomy and Physiology of the Respiratory System Group instruction provided by PowerPoint, verbal discussion, and written material to support subject matter. Instructor reviews respiratory cycle and anatomical components of the respiratory system and their functions. Instructor also reviews differences in obstructive and restrictive respiratory diseases with examples of each.    Oxygen Safety Group instruction provided by PowerPoint, verbal discussion, and written material to support subject matter. There is an overview of "What is Oxygen" and "Why do we need it".  Instructor also reviews how to create a safe environment for oxygen use, the importance of using oxygen as prescribed, and the risks of noncompliance. There is a brief discussion on traveling with oxygen and resources the patient may utilize.   Oxygen Use Group  instruction provided by PowerPoint, verbal discussion, and written material to discuss how supplemental oxygen is prescribed and different types of oxygen supply systems. Resources for more information are provided.  Flowsheet Row PULMONARY REHAB CHRONIC OBSTRUCTIVE PULMONARY DISEASE from 08/09/2023 in [facility]  Date 08/09/23  Educator RT  Instruction Review Code 1- Verbalizes Understanding       Breathing Techniques Group instruction that is supported by demonstration and informational  handouts. Instructor discusses the benefits of pursed lip and diaphragmatic breathing and detailed demonstration on how to perform both.     Risk Factor Reduction Group instruction that is supported by a PowerPoint presentation. Instructor discusses the definition of a risk factor, different risk factors for pulmonary disease, and how the heart and lungs work together.   Pulmonary Diseases Group instruction provided by PowerPoint, verbal discussion, and written material to support subject matter. Instructor gives an overview of the different type of pulmonary diseases. There is also a discussion on risk factors and symptoms as well as ways to manage the diseases.   Stress and Energy Conservation Group instruction provided by PowerPoint, verbal discussion, and written material to support subject matter. Instructor gives an overview of stress and the impact it can have on the body. Instructor also reviews ways to reduce stress. There is also a discussion on energy conservation and ways to conserve energy throughout the day. Flowsheet Row PULMONARY REHAB CHRONIC OBSTRUCTIVE PULMONARY DISEASE from 08/23/2023 in [facility], [facility]  Date 08/23/23  Educator RN  Instruction Review Code 1- Verbalizes Understanding       Warning Signs and Symptoms Group instruction provided by PowerPoint, verbal  discussion, and written material to support subject matter. Instructor reviews warning signs and symptoms of stroke, heart attack, cold and flu. Instructor also reviews ways to prevent the spread of infection.   Other Education Group or individual verbal, written, or video instructions that support the educational goals of the pulmonary rehab program.    Knowledge Questionnaire Score:  Knowledge Questionnaire Score - 07/18/23 1429       Knowledge Questionnaire Score   Pre Score 16/18             Core Components/Risk Factors/Patient Goals at Admission:  Personal Goals and Risk Factors at Admission - 07/18/23 1328       Core Components/Risk Factors/Patient Goals on Admission    Weight Management Weight Loss;Obesity    Improve shortness of breath with ADL's Yes    Intervention Provide education, individualized exercise plan and daily activity instruction to help decrease symptoms of SOB with activities of daily living.    Expected Outcomes Short Term: Improve cardiorespiratory fitness to achieve a reduction of symptoms when performing ADLs;Long Term: Be able to perform more ADLs without symptoms or delay the onset of symptoms             Core Components/Risk Factors/Patient Goals Review:   Goals and Risk Factor Review     Row Name 08/01/23 0946 08/01/23 0949 08/31/23 1205         Core Components/Risk Factors/Patient Goals Review   Personal Goals Review -- Weight Management/Obesity;Improve shortness of breath with ADL's;Develop more efficient breathing techniques such as purse lipped breathing and diaphragmatic breathing and practicing self-pacing with activity.;Increase knowledge of respiratory medications and ability to use respiratory devices properly. Weight Management/Obesity;Improve shortness of breath with ADL's;Develop more efficient breathing techniques such as purse lipped breathing and diaphragmatic breathing and practicing self-pacing with activity.     Review --  Core components/risk factors/patient goals monthly re-evaluate are as follows:   Demir has completed 3 sessions so far. Goal progressing on weight loss. He has been working with our dietician and she has educated him on calorie deficit and the plate method. Goal progressing on improving his shortness of breath with ADLs. Keyun is currently exercising on the up right bike and walking the track. He is slowly increasing  his lap count, METs and workload. His shortness of breath scale while exercising have been WNL. Goal met for increasing his knowledge of respiratory medications and the ability to use respiratory devices properly. Our respiratory therapist has worked with him and reviewed medication names, usage, directions, and side effects. Yaman understands without assistance and all questions answered. Goal in progress for developing more efficient breathing techniques such as purse lipped breathing and diaphragmatic breathing and practicing self-pacing with activity. We have begun teaching Ildefonso how to use and incorporate breathing techniques. Johan can self-pace while walking the track. Luca will continue to benefit from PR for nutrition, education, exercise, and lifestyle modification. Core components/risk factors/patient goals monthly re-evaluate are as follows: Goal progressing on weight loss. Edmond has not lost weight since starting the program, but Avrohom has been working with our dietician and has been educated on calorie deficit and the plate method. Goal progressing on improving his shortness of breath with ADLs. Viyan is currently exercising on the up right bike and walking the track. He is slowly increasing his lap count, METs and workload. His shortness of breath scale while exercising has been WNL. Goal met for developing more efficient breathing techniques such as purse lipped breathing and diaphragmatic breathing and practicing self-pacing with activity. Demarlo can initiate PLB independently and can pace himself while  walking the track. He is practicing diaphragmatic breathing at home. Zared will continue to benefit from PR for nutrition, education, exercise, and lifestyle modification.     Expected Outcomes -- For Siraj to lose weight, develop more efficient breathing techniques such as purse lipped breathing and diaphragmatic breathing; and practice self-pacing with activity, and improve his shortness of breath with ADLs. Pt will show progress toward meeting expected goals and outcomes.              Core Components/Risk Factors/Patient Goals at Discharge (Final Review):   Goals and Risk Factor Review - 08/31/23 1205       Core Components/Risk Factors/Patient Goals Review   Personal Goals Review Weight Management/Obesity;Improve shortness of breath with ADL's;Develop more efficient breathing techniques such as purse lipped breathing and diaphragmatic breathing and practicing self-pacing with activity.    Review Core components/risk factors/patient goals monthly re-evaluate are as follows: Goal progressing on weight loss. Bartolomeo has not lost weight since starting the program, but Mukhtar has been working with our dietician and has been educated on calorie deficit and the plate method. Goal progressing on improving his shortness of breath with ADLs. Kaizer is currently exercising on the up right bike and walking the track. He is slowly increasing his lap count, METs and workload. His shortness of breath scale while exercising has been WNL. Goal met for developing more efficient breathing techniques such as purse lipped breathing and diaphragmatic breathing and practicing self-pacing with activity. Jaree can initiate PLB independently and can pace himself while walking the track. He is practicing diaphragmatic breathing at home. Steffon will continue to benefit from PR for nutrition, education, exercise, and lifestyle modification.    Expected Outcomes Pt will show progress toward meeting expected goals and outcomes.              ITP Comments: Pt is making expected progress toward Pulmonary Rehab goals after completing 11 session(s). Recommend continued exercise, life style modification, education, and utilization of breathing techniques to increase stamina and strength, while also decreasing shortness of breath with exertion.    Comments: Dr. Genetta Kenning is Medical Director for Pulmonary Rehab at [facility].

## 2023-09-06 ENCOUNTER — Encounter (HOSPITAL_COMMUNITY)
Admission: RE | Admit: 2023-09-06 | Discharge: 2023-09-06 | Disposition: A | Discharge status: Home / Self Care | Source: Ambulatory Visit | Attending: Pulmonary Disease | Admitting: Pulmonary Disease

## 2023-09-06 DIAGNOSIS — J449 Chronic obstructive pulmonary disease, unspecified: Secondary | ICD-10-CM | POA: Diagnosis not present

## 2023-09-06 NOTE — Progress Notes (Signed)
 Daily Session Note  Patient Details  Name: Matthew Patrick MRN: [id number] Date of Birth: 09-10-1945 Referring Provider:   Gattis Kass Pulmonary Rehab Walk Test from 07/18/2023 in [facility] V[facility], [facility]  Referring Provider Briones       Encounter Date: 09/06/2023  Check In:  Session Check In - 09/06/23 1508       Check-In   Supervising physician immediately available to respond to emergencies [facility] MD immediately available    Physician(s) Palmer Bobo, NP    Location [facility]    Staff Present Sueellen Emery BS, ACSM-CEP, Exercise Physiologist;Casey Carmen Chol, RN, BSN;Johnny Porter, MS, Exercise Physiologist    Virtual Visit No    Medication changes reported     No    Fall or balance concerns reported    No    Tobacco Cessation No Change    Warm-up and Cool-down Performed as group-led instruction    Resistance Training Performed Yes    VAD Patient? No    PAD/SET Patient? No      Pain Assessment   Currently in Pain? No/denies    Multiple Pain Sites No             Capillary Blood Glucose: No results found for this or any previous visit (from the past 24 hours).    Social History   Tobacco Use  Smoking Status Former   Current packs/day: 0.00   Average packs/day: 1 pack/day for 5.0 years (5.0 ttl pk-yrs)   Types: Cigarettes   Start date: 04/24/1962   Quit date: 04/25/1967   Years since quitting: 56.4  Smokeless Tobacco Never    Goals Met:  Independence with exercise equipment Exercise tolerated well No report of concerns or symptoms today Strength training completed today  Goals Unmet:  Not Applicable  Comments: Service time is from 1316 to 1440    Dr. Genetta Kenning is Medical Director for Pulmonary Rehab at [facility].

## 2023-09-11 ENCOUNTER — Telehealth (HOSPITAL_COMMUNITY): Payer: Self-pay | Admitting: *Deleted

## 2023-09-11 ENCOUNTER — Encounter (HOSPITAL_COMMUNITY)
Admission: RE | Admit: 2023-09-11 | Discharge: 2023-09-11 | Disposition: A | Discharge status: Home / Self Care | Source: Ambulatory Visit | Attending: Pulmonary Disease | Admitting: Pulmonary Disease

## 2023-09-11 DIAGNOSIS — J449 Chronic obstructive pulmonary disease, unspecified: Secondary | ICD-10-CM

## 2023-09-11 NOTE — Progress Notes (Signed)
 Daily Session Note  Patient Details  Name: Matthew Patrick MRN: [id number] Date of Birth: [birth year] Referring Provider:   Gattis Kass Pulmonary Rehab Walk Test from 07/18/2023 in [facility] for Heart, Vascular, & Lung Health  Referring Provider Briones       Encounter Date: 09/11/2023  Check In:  Session Check In - 09/11/23 1408       Check-In   Supervising physician immediately available to respond to emergencies [facility] MD immediately available    Physician(s) Palmer Bobo, NP    Location [facility]    Staff Present Matthew Patrick BS, ACSM-CEP, Exercise Physiologist;Matthew Patrick, Matthew Lawyer, RN, Shasta Deist, MS, ACSM-CEP, Exercise Physiologist;Samantha Belarus, RD, LDN    Virtual Visit No    Medication changes reported     No    Fall or balance concerns reported    No    Tobacco Cessation No Change    Warm-up and Cool-down Performed as group-led instruction    Resistance Training Performed Yes    VAD Patient? No    PAD/SET Patient? No      Pain Assessment   Currently in Pain? No/denies    Multiple Pain Sites No             Capillary Blood Glucose: No results found for this or any previous visit (from the past 24 hours).    Social History   Tobacco Use  Smoking Status Former   Current packs/day: 0.00   Average packs/day: 1 pack/day for 5.0 years (5.0 ttl pk-yrs)   Types: Cigarettes   Start date: 04/24/1962   Quit date: 04/25/1967   Years since quitting: 56.4  Smokeless Tobacco Never    Goals Met:  Proper associated with RPD/PD & O2 Sat Independence with exercise equipment Exercise tolerated well No report of concerns or symptoms today Strength training completed today  Goals Unmet:  Not Applicable  Comments: Service time is from 1315 to 1440.    Dr. Genetta Kenning is Medical Director for Pulmonary Rehab at [facility].

## 2023-09-11 NOTE — Telephone Encounter (Signed)
 LVM regarding PR elevator not functioning today and that he will need to take stairs or cancel his appt and make up later.  German Koller BS, ACSM-CEP 09/11/2023 11:22 AM

## 2023-09-13 ENCOUNTER — Telehealth (HOSPITAL_COMMUNITY): Payer: Self-pay

## 2023-09-13 ENCOUNTER — Encounter (HOSPITAL_COMMUNITY)
Admission: RE | Admit: 2023-09-13 | Discharge: 2023-09-13 | Disposition: A | Discharge status: Home / Self Care | Source: Ambulatory Visit | Attending: Pulmonary Disease

## 2023-09-13 DIAGNOSIS — J449 Chronic obstructive pulmonary disease, unspecified: Secondary | ICD-10-CM

## 2023-09-13 NOTE — Telephone Encounter (Signed)
 Called pt to check on him since he missed PR class. He stated he had a drs appt. He will return to class on 09/18/23.

## 2023-09-18 ENCOUNTER — Encounter (HOSPITAL_COMMUNITY)
Admission: RE | Admit: 2023-09-18 | Discharge: 2023-09-18 | Disposition: A | Discharge status: Home / Self Care | Source: Ambulatory Visit | Attending: Pulmonary Disease | Admitting: Pulmonary Disease

## 2023-09-18 VITALS — Wt 188.7 lb

## 2023-09-18 DIAGNOSIS — J449 Chronic obstructive pulmonary disease, unspecified: Secondary | ICD-10-CM | POA: Diagnosis not present

## 2023-09-18 DIAGNOSIS — M545 Low back pain, unspecified: Secondary | ICD-10-CM

## 2023-09-18 NOTE — Progress Notes (Signed)
 Daily Session Note  Patient Details  Name: Matthew Patrick MRN: [id number] Date of Birth: 08-16-1945 Referring Provider:   Gattis Kass Pulmonary Rehab Walk Test from 07/18/2023 in [facility]  Referring Provider Briones       Encounter Date: 09/18/2023  Check In:  Session Check In - 09/18/23 1331       Check-In   Supervising physician immediately available to respond to emergencies [facility] MD immediately available    Physician(s) Charles Connor, NP    Location [facility]    Staff Present Sueellen Emery BS, ACSM-CEP, Exercise Physiologist;Mary Arlester Ladd, RN, Shasta Deist, MS, ACSM-CEP, Exercise Physiologist;Samantha Belarus, RD, LDN;Johnny Alexia Angelucci, MS, Exercise Physiologist    Virtual Visit No    Medication changes reported     No    Fall or balance concerns reported    No    Tobacco Cessation No Change    Warm-up and Cool-down Performed as group-led instruction    Resistance Training Performed Yes    VAD Patient? No    PAD/SET Patient? No      Pain Assessment   Currently in Pain? No/denies    Multiple Pain Sites No             Capillary Blood Glucose: No results found for this or any previous visit (from the past 24 hours).   Exercise Prescription Changes - 09/18/23 1500       Response to Exercise   Blood Pressure (Admit) 124/56    Blood Pressure (Exercise) 148/60    Blood Pressure (Exit) 132/58    Heart Rate (Admit) 64 bpm    Heart Rate (Exercise) 93 bpm    Heart Rate (Exit) 77 bpm    Oxygen Saturation (Admit) 95 %    Oxygen Saturation (Exercise) 96 %    Oxygen Saturation (Exit) 94 %    Rating of Perceived Exertion (Exercise) 11    Perceived Dyspnea (Exercise) 1    Duration Continue with 30 min of aerobic exercise without signs/symptoms of physical distress.    Intensity THRR unchanged      Progression   Progression Continue to progress workloads to maintain intensity without signs/symptoms of  physical distress.      Resistance Training   Training Prescription Yes    Weight blue bands    Reps 10-15    Time 10 Minutes      Treadmill   MPH 1.8    Grade 1    Minutes 15    METs 2.63      Bike   Level 4    Minutes 15    METs 3.2             Social History   Tobacco Use  Smoking Status Former   Current packs/day: 0.00   Average packs/day: 1 pack/day for 5.0 years (5.0 ttl pk-yrs)   Types: Cigarettes   Start date: 04/24/1962   Quit date: 04/25/1967   Years since quitting: 56.4  Smokeless Tobacco Never    Goals Met:  Exercise tolerated well No report of concerns or symptoms today Strength training completed today  Goals Unmet:  Not Applicable  Comments: Service time is from 1310 to 1442.    Dr. Genetta Kenning is Medical Director for Pulmonary Rehab at [facility].

## 2023-09-20 ENCOUNTER — Encounter (HOSPITAL_COMMUNITY)
Admission: RE | Admit: 2023-09-20 | Discharge: 2023-09-20 | Disposition: A | Discharge status: Home / Self Care | Source: Ambulatory Visit | Attending: Pulmonary Disease | Admitting: Pulmonary Disease

## 2023-09-20 DIAGNOSIS — J449 Chronic obstructive pulmonary disease, unspecified: Secondary | ICD-10-CM | POA: Diagnosis not present

## 2023-09-20 NOTE — Progress Notes (Signed)
 Daily Session Note  Patient Details  Name: Matthew Patrick MRN: [id number] Date of Birth: 10-Dec-1945 Referring Provider:   Gattis Kass Pulmonary Rehab Walk Test from 07/18/2023 in [facility]  Referring Provider Briones       Encounter Date: 09/20/2023  Check In:  Session Check In - 09/20/23 1428       Check-In   Supervising physician immediately available to respond to emergencies [facility] MD immediately available    Physician(s) Charles Connor, NP    [facility]    Staff Present Sueellen Emery BS, ACSM-CEP, Exercise Physiologist;Mary Arlester Ladd, RN, Shasta Deist, MS, ACSM-CEP, Exercise Physiologist;Casey Felipe Horton, RT    Virtual Visit No    Medication changes reported     No    Fall or balance concerns reported    No    Tobacco Cessation No Change    Warm-up and Cool-down Performed as group-led instruction    Resistance Training Performed Yes    VAD Patient? No    PAD/SET Patient? No      Pain Assessment   Currently in Pain? No/denies    Multiple Pain Sites No             Capillary Blood Glucose: No results found for this or any previous visit (from the past 24 hours).    Social History   Tobacco Use  Smoking Status Former   Current packs/day: 0.00   Average packs/day: 1 pack/day for 5.0 years (5.0 ttl pk-yrs)   Types: Cigarettes   Start date: 04/24/1962   Quit date: 04/25/1967   Years since quitting: 56.4  Smokeless Tobacco Never    Goals Met:  Proper associated with RPD/PD & O2 Sat Exercise tolerated well No report of concerns or symptoms today Strength training completed today  Goals Unmet:  Not Applicable  Comments: Service time is from 1317 to 1447.    Dr. Genetta Kenning is Medical Director for Pulmonary Rehab at [facility].

## 2023-09-25 ENCOUNTER — Encounter (HOSPITAL_COMMUNITY)

## 2023-09-27 ENCOUNTER — Encounter (HOSPITAL_COMMUNITY)
Admission: RE | Admit: 2023-09-27 | Discharge: 2023-09-27 | Disposition: A | Discharge status: Home / Self Care | Source: Ambulatory Visit | Attending: Pulmonary Disease | Admitting: Pulmonary Disease

## 2023-09-27 DIAGNOSIS — J449 Chronic obstructive pulmonary disease, unspecified: Secondary | ICD-10-CM | POA: Insufficient documentation

## 2023-09-27 NOTE — Progress Notes (Signed)
 Daily Session Note  Patient Details  Name: Matthew Patrick MRN: [id number] Date of Birth: 1946-03-30 Referring Provider:   Gattis Kass Pulmonary Rehab Walk Test from 07/18/2023 in [facility]  Referring Provider Briones       Encounter Date: 09/27/2023  Check In:  Session Check In - 09/27/23 1427       Check-In   Supervising physician immediately available to respond to emergencies [facility] MD immediately available    Physician(s) Palmer Bobo, NP    [facility]    Staff Present Sueellen Emery BS, ACSM-CEP, Exercise Physiologist;Kaylee Nolon Baxter, MS, ACSM-CEP, Exercise Physiologist;Raelynn Corron Arlice Lai, MS, ACSM-CEP, CCRP, Exercise Physiologist    Virtual Visit No    Medication changes reported     No    Fall or balance concerns reported    No    Tobacco Cessation No Change    Warm-up and Cool-down Performed as group-led instruction    Resistance Training Performed Yes    VAD Patient? No    PAD/SET Patient? No      Pain Assessment   Currently in Pain? No/denies    Multiple Pain Sites No             Capillary Blood Glucose: No results found for this or any previous visit (from the past 24 hours).    Social History   Tobacco Use  Smoking Status Former   Current packs/day: 0.00   Average packs/day: 1 pack/day for 5.0 years (5.0 ttl pk-yrs)   Types: Cigarettes   Start date: 04/24/1962   Quit date: 04/25/1967   Years since quitting: 56.4  Smokeless Tobacco Never    Goals Met:  Proper associated with RPD/PD & O2 Sat [place] with exercise equipment Exercise tolerated well No report of concerns or symptoms today Strength training completed today  Goals Unmet:  Not Applicable  Comments: Service time is from 1310 to 1445.    Dr. Genetta Kenning is Medical Director for Pulmonary Rehab at [facility].

## 2023-10-02 ENCOUNTER — Encounter (HOSPITAL_COMMUNITY)
Admission: RE | Admit: 2023-10-02 | Discharge: 2023-10-02 | Disposition: A | Discharge status: Home / Self Care | Source: Ambulatory Visit | Attending: Pulmonary Disease

## 2023-10-02 VITALS — Wt 188.3 lb

## 2023-10-02 DIAGNOSIS — J449 Chronic obstructive pulmonary disease, unspecified: Secondary | ICD-10-CM

## 2023-10-02 NOTE — Progress Notes (Signed)
 Home Exercise Prescription I have reviewed a Home Exercise Prescription with Matthew Patrick. He is currently only walking his dog 15 min daily at a slow pace. Discussed choosing to walk at an increased pace (maybe without his dog) for 15 min x2 or 30 min, 1-2 days a week. He also has access to [facility] and can go there. He is interested in the Pulmonary Wellness program and I will refer him. The patient stated that their goals were to lose 20 lbs. He has been discussing with the RD.  We reviewed exercise guidelines, target heart rate during exercise, RPE Scale, weather conditions, endpoints for exercise, warmup and cool down. The patient is encouraged to come to me with any questions. I will continue to follow up with the patient to assist them with progression and safety.  Spent 15 min discussing home exercise plan and goals.  Robertha Staples [place], [place], ACSM-CEP 10/02/2023 3:19 PM

## 2023-10-02 NOTE — Progress Notes (Signed)
 Daily Session Note  Patient Details  Name: Matthew Patrick MRN: [id number] Date of Birth: 21-Jan-1946 Referring Provider:   Gattis Kass Pulmonary Rehab Walk Test from 07/18/2023 in [facility]  Referring Provider Briones       Encounter Date: 10/02/2023  Check In:  Session Check In - 10/02/23 1435       Check-In   Supervising physician immediately available to respond to emergencies [facility] MD immediately available    Physician(s) Palmer Bobo, NP    [facility]    Staff Present Sueellen Emery BS, ACSM-CEP, Exercise Physiologist;Katalia Choma Nolon Baxter, MS, ACSM-CEP, Exercise Physiologist;Casey Carmen Chol, RN, BSN;Johnny Porter, MS, Exercise Physiologist    Virtual Visit No    Medication changes reported     No    Fall or balance concerns reported    No    Tobacco Cessation No Change    Warm-up and Cool-down Performed as group-led instruction    Resistance Training Performed Yes    VAD Patient? No    PAD/SET Patient? No      Pain Assessment   Currently in Pain? No/denies    Multiple Pain Sites No             Capillary Blood Glucose: No results found for this or any previous visit (from the past 24 hours).   Exercise Prescription Changes - 10/02/23 1400       Response to Exercise   Blood Pressure (Admit) 98/58    Blood Pressure (Exercise) 154/58    Blood Pressure (Exit) 108/72    Heart Rate (Admit) 57 bpm    Heart Rate (Exercise) 92 bpm    Heart Rate (Exit) 83 bpm    Oxygen Saturation (Admit) 94 %    Oxygen Saturation (Exercise) 92 %    Oxygen Saturation (Exit) 94 %    Rating of Perceived Exertion (Exercise) 11    Perceived Dyspnea (Exercise) 1    Duration Continue with 30 min of aerobic exercise without signs/symptoms of physical distress.    Intensity THRR unchanged      Progression   Progression Continue to progress workloads to maintain intensity without signs/symptoms of  physical distress.      Resistance Training   Training Prescription Yes    Weight blue bands    Reps 10-15    Time 10 Minutes      Treadmill   MPH 2    Grade 1    Minutes 15    METs 2.6      Bike   Level 4.5    Minutes 15    METs 3.4             Social History   Tobacco Use  Smoking Status Former   Current packs/day: 0.00   Average packs/day: 1 pack/day for 5.0 years (5.0 ttl pk-yrs)   Types: Cigarettes   Start date: 04/24/1962   Quit date: 04/25/1967   Years since quitting: 56.4  Smokeless Tobacco Never    Goals Met:  Proper associated with RPD/PD & O2 Sat Exercise tolerated well No report of concerns or symptoms today Strength training completed today  Goals Unmet:  Not Applicable  Comments: Service time is from 1320 to 1447.    Dr. Genetta Kenning is Medical Director for Pulmonary Rehab at [facility].

## 2023-10-03 NOTE — Progress Notes (Signed)
 Pulmonary Individual Treatment Plan  Patient Details  Name: Matthew Patrick MRN: [id number] Date of Birth: [birth year] Referring Provider:   Gattis Kass Pulmonary Rehab Walk Test from 07/18/2023 in [facility]  Referring Provider Briones       Initial Encounter Date:  Flowsheet Row Pulmonary Rehab Walk Test from 07/18/2023 in [facility]  Date 07/18/23       Visit Diagnosis: Stage 2 moderate COPD by GOLD classification (HCC)  Patient's Home Medications on Admission:   Current Outpatient Medications:    Acetaminophen  (TYLENOL  PO), Take 2 tablets by mouth daily as needed (pain)., Disp: , Rfl:    Albuterol  Sulfate (PROAIR  RESPICLICK) 108 (90 Base) MCG/ACT AEPB, Inhale into the lungs., Disp: , Rfl:    alprostadil (EDEX) 20 MCG injection, by Intracavernosal route., Disp: , Rfl:    amLODipine (NORVASC) 5 MG tablet, Take 5 mg by mouth every morning., Disp: , Rfl:    aspirin  81 MG tablet, Take 81 mg by mouth daily., Disp: , Rfl:    azelastine (ASTELIN) 0.1 % nasal spray, Place 2 sprays into the nose 2 (two) times daily., Disp: , Rfl:    busPIRone (BUSPAR) 15 MG tablet, Take 15 mg by mouth 3 (three) times daily., Disp: , Rfl:    escitalopram (LEXAPRO) 10 MG tablet, Take 10 mg by mouth daily., Disp: , Rfl:    fluticasone  furoate-vilanterol (BREO ELLIPTA ) 100-25 MCG/INH AEPB, Inhale 1 puff into the lungs daily., Disp: 1 each, Rfl: 5   galantamine (RAZADYNE ER) 8 MG 24 hr capsule, Take 8 mg by mouth daily with breakfast., Disp: , Rfl:    ibuprofen  (ADVIL ) 600 MG tablet, Take 600 mg by mouth every 8 (eight) hours as needed for moderate pain (pain score 4-6)., Disp: , Rfl:    latanoprost (XALATAN) 0.005 % ophthalmic solution, Place 1 drop into both eyes at bedtime., Disp: , Rfl:    magnesium  oxide (MAG-OX) 400 (240 Mg) MG tablet, Take 420 mg by mouth 2 (two) times daily. Take for leg  cramping, Disp: , Rfl:    meclizine  (ANTIVERT ) 25 MG tablet, Take 12.5 mg by mouth 3 (three) times daily as needed for dizziness., Disp: , Rfl:    memantine (NAMENDA) 10 MG tablet, Take 20 mg by mouth 2 (two) times daily., Disp: , Rfl:    Misc Natural Products (PROSTATE HEALTH) CAPS, Take 1 capsule by mouth daily., Disp: , Rfl:    mometasone (ASMANEX) 220 MCG/ACT inhaler, Inhale 1 puff into the lungs daily., Disp: , Rfl:    Multiple Vitamins-Minerals (EYE HEALTH PO), Take 1 tablet by mouth daily., Disp: , Rfl:    pramipexole (MIRAPEX) 0.125 MG tablet, Take 0.125 mg by mouth 4 (four) times daily. Aaron Aas125 mg three times daily and at bedtime, Disp: , Rfl:    ropinirole (REQUIP) 5 MG tablet, Take 1 tablet by mouth 3 (three) times daily. 5 mg twice daily and 10 mg at bedtime, Disp: , Rfl:    rosuvastatin  (CRESTOR ) 10 MG tablet, Take 10 mg by mouth daily., Disp: , Rfl:    tamsulosin (FLOMAX) 0.4 MG CAPS capsule, Take 0.8 mg by mouth at bedtime. , Disp: , Rfl:    temazepam (RESTORIL) 7.5 MG capsule, Take 7.5 mg by mouth at bedtime as needed for sleep., Disp: , Rfl:    TIOTROPIUM BROMIDE -OLODATEROL IN, Inhale 2.5 mcg into the lungs daily. 2 puffs/daily, Disp: , Rfl:  traZODone (DESYREL) 50 MG tablet, Take 50 mg by mouth at bedtime., Disp: , Rfl:    valproic acid (DEPAKENE) 250 MG capsule, Take 750 mg by mouth at bedtime., Disp: , Rfl:    vardenafil (LEVITRA) 20 MG tablet, Take 20 mg by mouth daily as needed for erectile dysfunction., Disp: , Rfl:    vitamin B-12 (CYANOCOBALAMIN) 500 MCG tablet, Take 500 mcg by mouth 2 (two) times daily. (Patient not taking: Reported on 07/18/2023), Disp: , Rfl:   Past Medical History: Past Medical History:  Diagnosis Date   Abnormal findings on diagnostic imaging of lung    Accidental exposure to phenoxyacid derivative herbicide    Actinic keratosis    Allergic rhinitis    Anxiety    Asthma    Benign prostatic hyperplasia    Bipolar affective disorder, mixed,  severe, with psychotic behavior (HCC)    Cognitive impairment    Depression    Drug induced akathisia    Dysuria    Exposure to potentially hazardous substance    Functional diarrhea    Hyperlipemia    Hypertension    Insomnia    Lumbago 06/27/2016   Major depressive disorder    Parkinson disease (HCC)    Restless leg syndrome    Sleep apnea    has c-pap    Tremor due to disorder of central nervous system    Vascular dementia without behavioral disturbance (HCC)     Tobacco Use: Social History   Tobacco Use  Smoking Status Former   Current packs/day: 0.00   Average packs/day: 1 pack/day for 5.0 years (5.0 ttl pk-yrs)   Types: Cigarettes   Start date: 04/24/1962   Quit date: 04/25/1967   Years since quitting: 56.4  Smokeless Tobacco Never    Labs: Review Flowsheet       Latest Ref Rng & Units 03/26/2011 09/17/2015 04/10/2016 01/11/2017  Labs for ITP Cardiac and Pulmonary Rehab  Cholestrol 0 - 200 mg/dL 161  096  045  - -  LDL (calc) 0 - 99 mg/dL 43  46  87  - -  HDL-C >40 mg/dL 45  44  58  - -  Trlycerides <150 mg/dL 409  811  914  - -  Hemoglobin A1c 4.0 - 6.0 % 5.7  5.7  - -  TCO2 22 - 32 mmol/L - - 28  27     Details       Multiple values from one day are sorted in reverse-chronological order         Capillary Blood Glucose: Lab Results  Component Value Date   GLUCAP 89 06/07/2021   GLUCAP 79 04/29/2018   GLUCAP 90 01/11/2017     Pulmonary Assessment Scores:  Pulmonary Assessment Scores     Row Name 07/18/23 1323 07/18/23 1342       ADL UCSD   ADL Phase -- Entry    SOB Score total -- 38      CAT Score   CAT Score -- 19      mMRC Score   mMRC Score -- 1            UCSD: Self-administered rating of dyspnea associated with activities of daily living (ADLs) 6-point scale (0 = not at all to 5 = maximal or unable to do because of breathlessness)  Scoring Scores range from 0 to 120.  Minimally important difference is 5  units  CAT: CAT can identify the health impairment of COPD patients and is  better correlated with disease progression.  CAT has a scoring range of zero to 40. The CAT score is classified into four groups of low (less than 10), medium (10 - 20), high (21-30) and very high (31-40) based on the impact level of disease on health status. A CAT score over 10 suggests significant symptoms.  A worsening CAT score could be explained by an exacerbation, poor medication adherence, poor inhaler technique, or progression of COPD or comorbid conditions.  CAT MCID is 2 points  mMRC: mMRC (Modified Medical Research Council) Dyspnea Scale is used to assess the degree of baseline functional disability in patients of respiratory disease due to dyspnea. No minimal important difference is established. A decrease in score of 1 point or greater is considered a positive change.   Pulmonary Function Assessment:  Pulmonary Function Assessment - 07/18/23 1342       Breath   Bilateral Breath Sounds Clear    Shortness of Breath Yes;Limiting activity;Fear of Shortness of Breath             Exercise Target Goals: Exercise Program Goal: Individual exercise prescription set using results from initial 6 min walk test and THRR while considering  patient's activity barriers and safety.   Exercise Prescription Goal: Initial exercise prescription builds to 30-45 minutes a day of aerobic activity, 2-3 days per week.  Home exercise guidelines will be given to patient during program as part of exercise prescription that the participant will acknowledge.  Activity Barriers & Risk Stratification:  Activity Barriers & Cardiac Risk Stratification - 07/18/23 1328       Activity Barriers & Cardiac Risk Stratification   Activity Barriers Muscular Weakness;Shortness of Breath;Deconditioning             6 Minute Walk:  6 Minute Walk     Row Name 07/18/23 1416         6 Minute Walk   Phase Initial     Distance  1510 feet     Walk Time 6 minutes     # of Rest Breaks 0     MPH 2.86     METS 3.43     RPE 13     Perceived Dyspnea  2     VO2 Peak 12.01     Symptoms No     Resting HR 75 bpm     Resting BP 122/60     Resting Oxygen Saturation  95 %     Exercise Oxygen Saturation  during 6 min walk 95 %     Max Ex. HR 2.66 bpm     Max Ex. BP 186/70     2 Minute Post BP 150/70       Interval HR   1 Minute HR 97     2 Minute HR 103     3 Minute HR 103     4 Minute HR 104     5 Minute HR 108     6 Minute HR 110     2 Minute Post HR 93     Interval Heart Rate? Yes       Interval Oxygen   Interval Oxygen? Yes     Baseline Oxygen Saturation % 95 %     1 Minute Oxygen Saturation % 97 %     1 Minute Liters of Oxygen 0 L     2 Minute Oxygen Saturation % 95 %     2 Minute Liters of Oxygen 0 L  3 Minute Oxygen Saturation % 96 %     3 Minute Liters of Oxygen 0 L     4 Minute Oxygen Saturation % 96 %     4 Minute Liters of Oxygen 0 L     5 Minute Oxygen Saturation % 96 %     5 Minute Liters of Oxygen 0 L     6 Minute Oxygen Saturation % 96 %     6 Minute Liters of Oxygen 0 L     2 Minute Post Oxygen Saturation % 97 %     2 Minute Post Liters of Oxygen 0 L              Oxygen Initial Assessment:  Oxygen Initial Assessment - 07/18/23 1321       Home Oxygen   Home Oxygen Device None    Sleep Oxygen Prescription CPAP    Home Exercise Oxygen Prescription None    Home Resting Oxygen Prescription None      Initial 6 min Walk   Oxygen Used None      Program Oxygen Prescription   Program Oxygen Prescription None      Intervention   Short Term Goals To learn and understand importance of maintaining oxygen saturations>88%;To learn and understand importance of monitoring SPO2 with pulse oximeter and demonstrate accurate use of the pulse oximeter.;To learn and demonstrate proper pursed lip breathing techniques or other breathing techniques.     Long  Term Goals Exhibits proper  breathing techniques, such as pursed lip breathing or other method taught during program session;Verbalizes importance of monitoring SPO2 with pulse oximeter and return demonstration;Compliance with respiratory medication;Maintenance of O2 saturations>88%             Oxygen Re-Evaluation:  Oxygen Re-Evaluation     Row Name 08/03/23 0741 08/31/23 1403 09/24/23 0745         Program Oxygen Prescription   Program Oxygen Prescription None None None       Home Oxygen   Home Oxygen Device None None None     Sleep Oxygen Prescription CPAP CPAP CPAP     Home Exercise Oxygen Prescription None None None     Home Resting Oxygen Prescription None None None       Goals/Expected Outcomes   Short Term Goals To learn and understand importance of maintaining oxygen saturations>88%;To learn and understand importance of monitoring SPO2 with pulse oximeter and demonstrate accurate use of the pulse oximeter.;To learn and demonstrate proper pursed lip breathing techniques or other breathing techniques.  To learn and understand importance of maintaining oxygen saturations>88%;To learn and understand importance of monitoring SPO2 with pulse oximeter and demonstrate accurate use of the pulse oximeter.;To learn and demonstrate proper pursed lip breathing techniques or other breathing techniques.  To learn and understand importance of maintaining oxygen saturations>88%;To learn and understand importance of monitoring SPO2 with pulse oximeter and demonstrate accurate use of the pulse oximeter.;To learn and demonstrate proper pursed lip breathing techniques or other breathing techniques.      Long  Term Goals Exhibits proper breathing techniques, such as pursed lip breathing or other method taught during program session;Verbalizes importance of monitoring SPO2 with pulse oximeter and return demonstration;Compliance with respiratory medication;Maintenance of O2 saturations>88% Exhibits proper breathing techniques, such  as pursed lip breathing or other method taught during program session;Verbalizes importance of monitoring SPO2 with pulse oximeter and return demonstration;Compliance with respiratory medication;Maintenance of O2 saturations>88% Exhibits proper breathing techniques, such as pursed lip breathing or other  method taught during program session;Verbalizes importance of monitoring SPO2 with pulse oximeter and return demonstration;Compliance with respiratory medication;Maintenance of O2 saturations>88%     Goals/Expected Outcomes Compliance and understanding of oxygen saturation monitoring and breathing techniques to decrease shortness of breath. Compliance and understanding of oxygen saturation monitoring and breathing techniques to decrease shortness of breath. Compliance and understanding of oxygen saturation monitoring and breathing techniques to decrease shortness of breath.              Oxygen Discharge (Final Oxygen Re-Evaluation):  Oxygen Re-Evaluation - 09/24/23 0745       Program Oxygen Prescription   Program Oxygen Prescription None      Home Oxygen   Home Oxygen Device None    Sleep Oxygen Prescription CPAP    Home Exercise Oxygen Prescription None    Home Resting Oxygen Prescription None      Goals/Expected Outcomes   Short Term Goals To learn and understand importance of maintaining oxygen saturations>88%;To learn and understand importance of monitoring SPO2 with pulse oximeter and demonstrate accurate use of the pulse oximeter.;To learn and demonstrate proper pursed lip breathing techniques or other breathing techniques.     Long  Term Goals Exhibits proper breathing techniques, such as pursed lip breathing or other method taught during program session;Verbalizes importance of monitoring SPO2 with pulse oximeter and return demonstration;Compliance with respiratory medication;Maintenance of O2 saturations>88%    Goals/Expected Outcomes Compliance and understanding of oxygen saturation  monitoring and breathing techniques to decrease shortness of breath.             Initial Exercise Prescription:  Initial Exercise Prescription - 07/18/23 1400       Date of Initial Exercise RX and Referring Provider   Date 07/18/23    Referring Provider Briones    Expected Discharge Date 10/11/23      Treadmill   MPH 2    Grade 0    Minutes 15    METs 2.5      Bike   Level 3    Minutes 15    METs 3      Prescription Details   Frequency (times per week) 2    Duration Progress to 30 minutes of continuous aerobic without signs/symptoms of physical distress      Intensity   THRR 40-80% of Max Heartrate 57-114    Ratings of Perceived Exertion 11-13    Perceived Dyspnea 0-4      Progression   Progression Continue to progress workloads to maintain intensity without signs/symptoms of physical distress.      Resistance Training   Training Prescription Yes    Weight --   black bands   Reps 10-15             Perform Capillary Blood Glucose checks as needed.  Exercise Prescription Changes:   Exercise Prescription Changes     Row Name 07/24/23 1500 08/07/23 1500 08/21/23 1500 09/04/23 1400 09/18/23 1500     Response to Exercise   Blood Pressure (Admit) 130/68 108/58 100/52 108/60 124/56   Blood Pressure (Exercise) 180/70 150/60 150/60 132/60 148/60   Blood Pressure (Exit) 140/64 102/50 140/60 122/60 132/58   Heart Rate (Admit) 67 bpm 65 bpm 59 bpm 60 bpm 64 bpm   Heart Rate (Exercise) 101 bpm 84 bpm 88 bpm 78 bpm 93 bpm   Heart Rate (Exit) 80 bpm 71 bpm 72 bpm 65 bpm 77 bpm   Oxygen Saturation (Admit) 96 % 94 % 97 % 94 %  95 %   Oxygen Saturation (Exercise) 95 % 93 % 96 % 94 % 96 %   Oxygen Saturation (Exit) 95 % 94 % 97 % 95 % 94 %   Rating of Perceived Exertion (Exercise) 12 11 11 9 11    Perceived Dyspnea (Exercise) 2 1 2 1 1    Duration Continue with 30 min of aerobic exercise without signs/symptoms of physical distress. Continue with 30 min of aerobic  exercise without signs/symptoms of physical distress. Continue with 30 min of aerobic exercise without signs/symptoms of physical distress. Continue with 30 min of aerobic exercise without signs/symptoms of physical distress. Continue with 30 min of aerobic exercise without signs/symptoms of physical distress.   Intensity THRR unchanged THRR unchanged THRR unchanged THRR unchanged THRR unchanged     Progression   Progression Continue to progress workloads to maintain intensity without signs/symptoms of physical distress. Continue to progress workloads to maintain intensity without signs/symptoms of physical distress. Continue to progress workloads to maintain intensity without signs/symptoms of physical distress. Continue to progress workloads to maintain intensity without signs/symptoms of physical distress. Continue to progress workloads to maintain intensity without signs/symptoms of physical distress.     Resistance Training   Training Prescription Yes Yes Yes Yes Yes   Weight blue bands blue bands blue bands blue bands blue bands   Reps 10-15 10-15 10-15 10-15 10-15   Time 10 Minutes 10 Minutes 10 Minutes 10 Minutes 10 Minutes     Treadmill   MPH -- -- -- -- 1.8   Grade -- -- -- -- 1   Minutes -- -- -- -- 15   METs -- -- -- -- 2.63     Bike   Level 3 3 3 4 4    Minutes 15 15 15 15 15    METs 3 3.4 3.1 3.2 3.2     Track   Laps 7 12 13 12  --   Minutes 10 15 15 15  --   METs 2.6 2.85 3 -- --    Row Name 10/02/23 1400             Response to Exercise   Blood Pressure (Admit) 98/58       Blood Pressure (Exercise) 154/58       Blood Pressure (Exit) 108/72       Heart Rate (Admit) 57 bpm       Heart Rate (Exercise) 92 bpm       Heart Rate (Exit) 83 bpm       Oxygen Saturation (Admit) 94 %       Oxygen Saturation (Exercise) 92 %       Oxygen Saturation (Exit) 94 %       Rating of Perceived Exertion (Exercise) 11       Perceived Dyspnea (Exercise) 1       Duration Continue with  30 min of aerobic exercise without signs/symptoms of physical distress.       Intensity THRR unchanged         Progression   Progression Continue to progress workloads to maintain intensity without signs/symptoms of physical distress.         Resistance Training   Training Prescription Yes       Weight blue bands       Reps 10-15       Time 10 Minutes         Treadmill   MPH 2       Grade 1  Minutes 15       METs 2.6         Bike   Level 4.5       Minutes 15       METs 3.4         Home Exercise Plan   Plans to continue exercise at Home (comment)  walking or gym       Frequency Add 2 additional days to program exercise sessions.       Initial Home Exercises Provided 10/02/23                Exercise Comments:   Exercise Comments     Row Name 07/24/23 1513 10/02/23 1516         Exercise Comments Pt completed his first day of group exercise. He attempted to walk the treadmill however he felt exerted and dizzy after a few minutes. Therefore walked the track for 10 min, METs 2.6, tolerated well. Will continue on track. He then exercised on the scifit bike on level 3, METs 3, for 15 min. Tolerated well. Performed warm up and cool down standing without limitations. Discussed METs. Discussed with pt home exercise plan. He is currently only walking his dog 15 min daily at a slow pace. Discussed choosing to walk at an increased pace (maybe without his dog) for 15 min x2 or 30 min, 1-2 days a week. He also has access to [facility] and can go there. He is interested in the Pulmonary Wellness program and I will refer him.               Exercise Goals and Review:   Exercise Goals     Row Name 07/18/23 1321             Exercise Goals   Increase Physical Activity Yes       Intervention Provide advice, education, support and counseling about physical activity/exercise needs.;Develop an individualized exercise prescription for aerobic and resistive training based on  initial evaluation findings, risk stratification, comorbidities and participant's personal goals.       Expected Outcomes Short Term: Attend rehab on a regular basis to increase amount of physical activity.;Long Term: Exercising regularly at least 3-5 days a week.;Long Term: Add in home exercise to make exercise part of routine and to increase amount of physical activity.       Increase Strength and Stamina Yes       Intervention Provide advice, education, support and counseling about physical activity/exercise needs.;Develop an individualized exercise prescription for aerobic and resistive training based on initial evaluation findings, risk stratification, comorbidities and participant's personal goals.       Expected Outcomes Short Term: Increase workloads from initial exercise prescription for resistance, speed, and METs.;Short Term: Perform resistance training exercises routinely during rehab and add in resistance training at home;Long Term: Improve cardiorespiratory fitness, muscular endurance and strength as measured by increased METs and functional capacity ( )       Able to understand and use rate of perceived exertion (RPE) scale Yes       Intervention Provide education and explanation on how to use RPE scale       Expected Outcomes Short Term: Able to use RPE daily in rehab to express subjective intensity level;Long Term:  Able to use RPE to guide intensity level when exercising independently       Able to understand and use Dyspnea scale Yes       Intervention Provide education and explanation on how  to use Dyspnea scale       Expected Outcomes Short Term: Able to use Dyspnea scale daily in rehab to express subjective sense of shortness of breath during exertion;Long Term: Able to use Dyspnea scale to guide intensity level when exercising independently       Knowledge and understanding of Target Heart Rate Range (THRR) Yes       Intervention Provide education and explanation of THRR  including how the numbers were predicted and where they are located for reference       Expected Outcomes Short Term: Able to state/look up THRR;Short Term: Able to use daily as guideline for intensity in rehab;Long Term: Able to use THRR to govern intensity when exercising independently       Understanding of Exercise Prescription Yes       Intervention Provide education, explanation, and written materials on patient's individual exercise prescription       Expected Outcomes Short Term: Able to explain program exercise prescription;Long Term: Able to explain home exercise prescription to exercise independently                Exercise Goals Re-Evaluation :  Exercise Goals Re-Evaluation     Row Name 08/03/23 0739 08/31/23 1403 09/24/23 0745         Exercise Goal Re-Evaluation   Exercise Goals Review Increase Physical Activity;Able to understand and use Dyspnea scale;Understanding of Exercise Prescription;Increase Strength and Stamina;Knowledge and understanding of Target Heart Rate Range (THRR);Able to understand and use rate of perceived exertion (RPE) scale Increase Physical Activity;Able to understand and use Dyspnea scale;Understanding of Exercise Prescription;Increase Strength and Stamina;Knowledge and understanding of Target Heart Rate Range (THRR);Able to understand and use rate of perceived exertion (RPE) scale Increase Physical Activity;Able to understand and use Dyspnea scale;Understanding of Exercise Prescription;Increase Strength and Stamina;Knowledge and understanding of Target Heart Rate Range (THRR);Able to understand and use rate of perceived exertion (RPE) scale     Comments Pt has completed 2 1/2 exercise sessions. He needed to leave the last class early due to significant SOB. He missed the last session for a MD appt. Pt is walking the track for 15 min, METs 3 at the most. He then is exercising on the scifit bike for 15 min, level 3, METs 3.3. He is motivated. Performs warm up  and cool down without limitations, black bands. Will progress as tolerated. Pt has completed 10 exercise sessions, missing 2 for appointments.  Pt is walking the track for 15 min, METs 3.15. He then is exercising on the scifit bike for 15 min, level 4, METs 3.4. He is motivated. Will discuss moving to the treadmill although he felt insecure the first attempt. Performs warm up and cool down without limitations, black bands. Will progress as tolerated. Pt has completed 15 exercise sessions.  Pt is now walking on the treadmill for 15 min, METs 2.2. His METs have decreased on the treadmill as he has worked on getting accustomed to it. Will increase incline next session. He then is exercising on the scifit bike for 15 min, level 4, METs 3.2. Will increase level next session.He is motivated.  Performs warm up and cool down without limitations, black bands. Will progress as tolerated.     Expected Outcomes Through exercise at rehab and at home, patient will increase physical capacity and ADL's will be easier to perform. The patient will also feel comfortable establishing a home exercise program. Through exercise at rehab and at home, patient will increase physical capacity  and ADL's will be easier to perform. The patient will also feel comfortable establishing a home exercise program. Through exercise at rehab and at home, patient will increase physical capacity and ADL's will be easier to perform. The patient will also feel comfortable establishing a home exercise program.              Discharge Exercise Prescription (Final Exercise Prescription Changes):  Exercise Prescription Changes - 10/02/23 1400       Response to Exercise   Blood Pressure (Admit) 98/58    Blood Pressure (Exercise) 154/58    Blood Pressure (Exit) 108/72    Heart Rate (Admit) 57 bpm    Heart Rate (Exercise) 92 bpm    Heart Rate (Exit) 83 bpm    Oxygen Saturation (Admit) 94 %    Oxygen Saturation (Exercise) 92 %    Oxygen  Saturation (Exit) 94 %    Rating of Perceived Exertion (Exercise) 11    Perceived Dyspnea (Exercise) 1    Duration Continue with 30 min of aerobic exercise without signs/symptoms of physical distress.    Intensity THRR unchanged      Progression   Progression Continue to progress workloads to maintain intensity without signs/symptoms of physical distress.      Resistance Training   Training Prescription Yes    Weight blue bands    Reps 10-15    Time 10 Minutes      Treadmill   MPH 2    Grade 1    Minutes 15    METs 2.6      Bike   Level 4.5    Minutes 15    METs 3.4      Home Exercise Plan   Plans to continue exercise at Home (comment)   walking or gym   Frequency Add 2 additional days to program exercise sessions.    Initial Home Exercises Provided 10/02/23             Nutrition:  Target Goals: Understanding of nutrition guidelines, daily intake of sodium 1500mg , cholesterol 200mg , calories 30% from fat and 7% or less from saturated fats, daily to have 5 or more servings of fruits and vegetables.  Biometrics:  Pre Biometrics - 07/18/23 1423       Pre Biometrics   Grip Strength 29 kg              Nutrition Therapy Plan and Nutrition Goals:  Nutrition Therapy & Goals - 10/02/23 1436       Nutrition Therapy   Diet General Healthy Diet    Drug/Food Interactions Statins/Certain Fruits      Personal Nutrition Goals   Nutrition Goal Patient to improve diet quality by using the plate method as a guide for meal planning to include lean protein/plant protein, fruits, vegetables, whole grains, nonfat dairy as part of a well-balanced diet.   goal in progress.   Personal Goal #2 Patient to identify strategies for weight loss of 0.5-2.0# per week.   goal not met.   Comments Goals in progress. Whitt has medical history of HTN, OSA, COPD. Stanely reports motivation to lose weight; we have discussed multiple strategies for creating calorie deficit including calorie  density, the plate method as aguide for meal planning, decreasing snacking, etc. Patient has not lost weight but maintained weight since starting with our program. Patient will benefit from participation in pulmonary rehab for nutrition, exercise, and lifestyle modification.      Intervention Plan   Intervention Prescribe, educate  and counsel regarding individualized specific dietary modifications aiming towards targeted core components such as weight, hypertension, lipid management, diabetes, heart failure and other comorbidities.;Nutrition handout(s) given to patient.    Expected Outcomes Short Term Goal: Understand basic principles of dietary content, such as calories, fat, sodium, cholesterol and nutrients.;Long Term Goal: Adherence to prescribed nutrition plan.             Nutrition Assessments:  MEDIFICTS Score Key: >=70 Need to make dietary changes  40-70 Heart Healthy Diet <= 40 Therapeutic Level Cholesterol Diet   Picture Your Plate Scores: <30 Unhealthy dietary pattern with much room for improvement. 41-50 Dietary pattern unlikely to meet recommendations for good health and room for improvement. 51-60 More healthful dietary pattern, with some room for improvement.  >60 Healthy dietary pattern, although there may be some specific behaviors that could be improved.    Nutrition Goals Re-Evaluation:  Nutrition Goals Re-Evaluation     Row Name 07/24/23 1513 08/28/23 1358 10/02/23 1436         Goals   Current Weight 187 lb 13.3 oz (85.2 kg) 188 lb 0.8 oz (85.3 kg) 188 lb 4.4 oz (85.4 kg)     Comment labs WNL no new labs; most recent labs show  labs WNL no new labs; most recent labs show labs WNL     Expected Outcome Finnegan has medical history of HTN, OSA, COPD. Pio reports motivation to lose weight; will continue to discuss strategies for creating calorie deficit including calorie density, the plate method as aguide for meal planning, etc. Patient will benefit from participation  in pulmonary rehab for nutrition, exercise, and lifestyle modification. Goals in progress. Baruch has medical history of HTN, OSA, COPD. Hadrian reports motivation to lose weight; we have discussed multiple strategies for creating calorie deficit including calorie density, the plate method as aguide for meal planning, decreasing snacking, etc. Patient has not lost weight but maintained weight since starting with our program. Patient will benefit from participation in pulmonary rehab for nutrition, exercise, and lifestyle modification. Goals in progress. Kyaire has medical history of HTN, OSA, COPD. Montay reports motivation to lose weight; we have discussed multiple strategies for creating calorie deficit including calorie density, the plate method as aguide for meal planning, decreasing snacking, etc. Patient has not lost weight but maintained weight since starting with our program. Patient will benefit from participation in pulmonary rehab for nutrition, exercise, and lifestyle modification.              Nutrition Goals Discharge (Final Nutrition Goals Re-Evaluation):  Nutrition Goals Re-Evaluation - 10/02/23 1436       Goals   Current Weight 188 lb 4.4 oz (85.4 kg)    Comment no new labs; most recent labs show labs WNL    Expected Outcome Goals in progress. Jax has medical history of HTN, OSA, COPD. Nate reports motivation to lose weight; we have discussed multiple strategies for creating calorie deficit including calorie density, the plate method as aguide for meal planning, decreasing snacking, etc. Patient has not lost weight but maintained weight since starting with our program. Patient will benefit from participation in pulmonary rehab for nutrition, exercise, and lifestyle modification.             Psychosocial: Target Goals: Acknowledge presence or absence of significant depression and/or stress, maximize coping skills, provide positive support system. Participant is able to verbalize types and  ability to use techniques and skills needed for reducing stress and depression.  Initial Review &  Psychosocial Screening:  Initial Psych Review & Screening - 07/18/23 1323       Initial Review   Current issues with Current Depression;History of Depression;Current Anxiety/Panic;Current Psychotropic Meds;Current Sleep Concerns      Family Dynamics   Good Support System? Yes    Comments supportive wife      Barriers   Psychosocial barriers to participate in program The patient should benefit from training in stress management and relaxation.;Psychosocial barriers identified (see note)      Screening Interventions   Interventions Encouraged to exercise;Provide feedback about the scores to participant;To provide support and resources with identified psychosocial needs    Expected Outcomes Short Term goal: Utilizing psychosocial counselor, staff and physician to assist with identification of specific Stressors or current issues interfering with healing process. Setting desired goal for each stressor or current issue identified.;Long Term Goal: Stressors or current issues are controlled or eliminated.;Short Term goal: Identification and review with participant of any Quality of Life or Depression concerns found by scoring the questionnaire.;Long Term goal: The participant improves quality of Life and PHQ9 Scores as seen by post scores and/or verbalization of changes             Quality of Life Scores:  Scores of 19 and below usually indicate a poorer quality of life in these areas.  A difference of  2-3 points is a clinically meaningful difference.  A difference of 2-3 points in the total score of the Quality of Life Index has been associated with significant improvement in overall quality of life, self-image, physical symptoms, and general health in studies assessing change in quality of life.  PHQ-9: Review Flowsheet       07/18/2023 11/19/2017  Depression screen PHQ 2/9  Decreased  Interest 2 3 3   Down, Depressed, Hopeless 0 3 3  PHQ - 2 Score 2 6 6   Altered sleeping 1 3 3   Tired, decreased energy 1 3 3   Change in appetite 0 0 0  Feeling bad or failure about yourself  0 3 3  Trouble concentrating 0 3 3  Moving slowly or fidgety/restless 0 3 3  Suicidal thoughts 0 0 0  PHQ-9 Score 4 21 21   Difficult doing work/chores Not difficult at all Extremely dIfficult Very difficult    Details       Multiple values from one day are sorted in reverse-chronological order        Interpretation of Total Score  Total Score Depression Severity:  1-4 = Minimal depression, 5-9 = Mild depression, 10-14 = Moderate depression, 15-19 = Moderately severe depression, 20-27 = Severe depression   Psychosocial Evaluation and Intervention:  Psychosocial Evaluation - 07/18/23 1325       Psychosocial Evaluation & Interventions   Interventions Encouraged to exercise with the program and follow exercise prescription    Comments Pt is a veteran and has a history of depression, anxiety and PTSD. In the past, pt has been hospitalized for mental health and received ECT treatments in the past. He regularly has therapy appointments and sees his psychiatrist for med management. He states that his mental health is stable and denies any needs or resources at this time.    Expected Outcomes For Chrishun to have decreased symptoms of stress, anxiety, and depression. [facility].    Continue Psychosocial Services  Follow up required by staff             Psychosocial Re-Evaluation:  Psychosocial Re-Evaluation  Row Name 08/01/23 1610 08/31/23 1205 10/01/23 1512         Psychosocial Re-Evaluation   Current issues with Current Depression;History of Depression;Current Anxiety/Panic;Current Psychotropic Meds;Current Sleep Concerns;Current Stress Concerns History of Depression;Current Psychotropic Meds History of Depression;Current Psychotropic Meds      Comments Pt has an extensive mental health history. He is under the care of the [place] and sees a therapist and psychiatrist regularly. He is compliant with taking his psychotropic meds. Toddy denies any additional needs or resources currently. Pt has an extensive mental health history. He is under the care of the [place] and sees a therapist and psychiatrist regularly. He is compliant with taking his psychotropic meds. Karthikeya denies any additional needs or resources currently. Psychosocial monthly re-evaluation is as follows: No changes since last assessment. Bernhard is still under the care of the [place] and sees a therapist and psychiatrist regularly. He is compliant with taking his psychotropic meds. Torrance denies any additional needs or resources.     Expected Outcomes For Kwane to have decreased symptoms of stress, anxiety, and depression. [facility] and be [facility]. For Jaiyon to have decreased symptoms of stress, anxiety, and depression. [facility] and be [facility]. For Saben to have decreased symptoms of stress, anxiety, and depression. [facility] and be [facility].     Interventions Encouraged to attend Pulmonary Rehabilitation for the exercise;Relaxation education;Stress management education Encouraged to attend Pulmonary Rehabilitation for the exercise Encouraged to attend Pulmonary Rehabilitation for the exercise     Continue Psychosocial Services  Follow up required by staff No Follow up required No Follow up required              Psychosocial Discharge (Final Psychosocial Re-Evaluation):  Psychosocial Re-Evaluation - 10/01/23 1512       Psychosocial Re-Evaluation   Current issues with History of Depression;Current Psychotropic Meds    Comments Psychosocial monthly re-evaluation is as follows: No changes since last assessment. Timur is still under the care of the [place] and sees a therapist and psychiatrist regularly. He is compliant with taking his  psychotropic meds. Jerimyah denies any additional needs or resources.    Expected Outcomes For Wayde to have decreased symptoms of stress, anxiety, and depression. [facility] and be [facility].    Interventions Encouraged to attend Pulmonary Rehabilitation for the exercise    Continue Psychosocial Services  No Follow up required             Education: Education Goals: Education classes will be provided on a weekly basis, covering required topics. Participant will state understanding/return demonstration of topics presented.  Learning Barriers/Preferences:  Learning Barriers/Preferences - 07/18/23 1325       Learning Barriers/Preferences   Learning Barriers Sight    Learning Preferences Group Instruction;Verbal Instruction;Written Material             Education Topics: Know Your Numbers Group instruction that is supported by a PowerPoint presentation. Instructor discusses importance of knowing and understanding resting, exercise, and post-exercise oxygen saturation, heart rate, and blood pressure. Oxygen saturation, heart rate, blood pressure, rating of perceived exertion, and dyspnea are reviewed along with a normal range for these values.  Flowsheet Row PULMONARY REHAB CHRONIC OBSTRUCTIVE PULMONARY DISEASE from 07/26/2023 in [facility]  Date 07/26/23  Educator EP  Instruction Review Code 1- Verbalizes Understanding  Exercise for the Pulmonary Patient Group instruction that is supported by a PowerPoint presentation. Instructor discusses benefits of exercise, core components of exercise, frequency, duration, and intensity of an exercise routine, importance of utilizing pulse oximetry during exercise, safety while exercising, and options of places to exercise outside of rehab.    MET Level  Group instruction provided by PowerPoint, verbal discussion, and written material to support subject  matter. Instructor reviews what METs are and how to increase METs.  Flowsheet Row PULMONARY REHAB CHRONIC OBSTRUCTIVE PULMONARY DISEASE from 09/13/2023 in [facility], [facility]  Date 09/13/23  Educator EP  Instruction Review Code 1- Verbalizes Understanding       Pulmonary Medications Verbally interactive group education provided by instructor with focus on inhaled medications and proper administration.   Anatomy and Physiology of the Respiratory System Group instruction provided by PowerPoint, verbal discussion, and written material to support subject matter. Instructor reviews respiratory cycle and anatomical components of the respiratory system and their functions. Instructor also reviews differences in obstructive and restrictive respiratory diseases with examples of each.  Flowsheet Row PULMONARY REHAB CHRONIC OBSTRUCTIVE PULMONARY DISEASE from 09/27/2023 in [facility]  Date 09/27/23  Educator RT  Instruction Review Code 1- Verbalizes Understanding       Oxygen Safety Group instruction provided by PowerPoint, verbal discussion, and written material to support subject matter. There is an overview of "What is Oxygen" and "Why do we need it".  Instructor also reviews how to create a safe environment for oxygen use, the importance of using oxygen as prescribed, and the risks of noncompliance. There is a brief discussion on traveling with oxygen and resources the patient may utilize.   Oxygen Use Group instruction provided by PowerPoint, verbal discussion, and written material to discuss how supplemental oxygen is prescribed and different types of oxygen supply systems. Resources for more information are provided.  Flowsheet Row PULMONARY REHAB CHRONIC OBSTRUCTIVE PULMONARY DISEASE from 08/09/2023 in [facility] for Heart, Vascular, & Lung Health  Date  08/09/23  Educator RT  Instruction Review Code 1- Verbalizes Understanding       Breathing Techniques Group instruction that is supported by demonstration and informational handouts. Instructor discusses the benefits of pursed lip and diaphragmatic breathing and detailed demonstration on how to perform both.     Risk Factor Reduction Group instruction that is supported by a PowerPoint presentation. Instructor discusses the definition of a risk factor, different risk factors for pulmonary disease, and how the heart and lungs work together. Flowsheet Row PULMONARY REHAB CHRONIC OBSTRUCTIVE PULMONARY DISEASE from 09/06/2023 in [facility], [facility]ascular, [facility] Lung [facility]  Date 09/06/23  Educator EP  Instruction Review Code 1- Verbalizes Understanding       Pulmonary Diseases Group instruction provided by PowerPoint, verbal discussion, and written material to support subject matter. Instructor gives an overview of the different type of pulmonary diseases. There is also a discussion on risk factors and symptoms as well as ways to manage the diseases.   Stress and Energy Conservation Group instruction provided by PowerPoint, verbal discussion, and written material to support subject matter. Instructor gives an overview of stress and the impact it can have on the body. Instructor also reviews ways to reduce stress. There is also a discussion on energy conservation and ways to conserve energy throughout the day. Flowsheet Row PULMONARY REHAB CHRONIC OBSTRUCTIVE PULMONARY DISEASE  from 08/23/2023 in [facility] for Heart, Vascular, & Lung Health  Date 08/23/23  Educator RN  Instruction Review Code 1- Verbalizes Understanding       Warning Signs and Symptoms Group instruction provided by PowerPoint, verbal discussion, and written material to support subject matter. Instructor reviews warning signs and symptoms of stroke, heart attack, cold and  flu. Instructor also reviews ways to prevent the spread of infection.   Other Education Group or individual verbal, written, or video instructions that support the educational goals of the pulmonary rehab program.    Knowledge Questionnaire Score:  Knowledge Questionnaire Score - 07/18/23 1429       Knowledge Questionnaire Score   Pre Score 16/18             Core Components/Risk Factors/Patient Goals at Admission:  Personal Goals and Risk Factors at Admission - 07/18/23 1328       Core Components/Risk Factors/Patient Goals on Admission    Weight Management Weight Loss;Obesity    Improve shortness of breath with ADL's Yes    Intervention Provide education, individualized exercise plan and daily activity instruction to help decrease symptoms of SOB with activities of daily living.    Expected Outcomes Short Term: Improve cardiorespiratory fitness to achieve a reduction of symptoms when performing ADLs;Long Term: Be able to perform more ADLs without symptoms or delay the onset of symptoms             Core Components/Risk Factors/Patient Goals Review:   Goals and Risk Factor Review     Row Name 08/01/23 0946 08/01/23 0949 08/31/23 1205 10/01/23 1513       Core Components/Risk Factors/Patient Goals Review   Personal Goals Review -- Weight Management/Obesity;Improve shortness of breath with ADL's;Develop more efficient breathing techniques such as purse lipped breathing and diaphragmatic breathing and practicing self-pacing with activity.;Increase knowledge of respiratory medications and ability to use respiratory devices properly. Weight Management/Obesity;Improve shortness of breath with ADL's;Develop more efficient breathing techniques such as purse lipped breathing and diaphragmatic breathing and practicing self-pacing with activity. Weight Management/Obesity;Improve shortness of breath with ADL's    Review -- Core components/risk factors/patient goals monthly re-evaluate  are as follows:   Siddharth has completed 3 sessions so far. Goal progressing on weight loss. He has been working with our dietician and she has educated him on calorie deficit and the plate method. Goal progressing on improving his shortness of breath with ADLs. Tawan is currently exercising on the up right bike and walking the track. He is slowly increasing his lap count, METs and workload. His shortness of breath scale while exercising have been WNL. Goal met for increasing his knowledge of respiratory medications and the ability to use respiratory devices properly. Our respiratory therapist has worked with him and reviewed medication names, usage, directions, and side effects. Drexel understands without assistance and all questions answered. Goal in progress for developing more efficient breathing techniques such as purse lipped breathing and diaphragmatic breathing and practicing self-pacing with activity. We have begun teaching Evangelos how to use and incorporate breathing techniques. Denzel can self-pace while walking the track. Mahmoud will continue to benefit from PR for nutrition, education, exercise, and lifestyle modification. Core components/risk factors/patient goals monthly re-evaluate are as follows: Goal progressing on weight loss. Keng has not lost weight since starting the program, but Quindarrius has been working with our dietician and has been educated on calorie deficit and the plate method. Goal progressing on improving his shortness of breath with ADLs.  Roddie is currently exercising on the up right bike and walking the track. He is slowly increasing his lap count, METs and workload. His shortness of breath scale while exercising has been WNL. Goal met for developing more efficient breathing techniques such as purse lipped breathing and diaphragmatic breathing and practicing self-pacing with activity. Jep can initiate PLB independently and can pace himself while walking the track. He is practicing diaphragmatic breathing at  home. Verlyn will continue to benefit from PR for nutrition, education, exercise, and lifestyle modification. Core components/risk factors/patient goals monthly re-evaluate are as follows: Goal progressing on weight loss. Oswaldo has not lost weight since starting the program, but Kelvis has been working with our dietician and has been educated on calorie deficit and the plate method. Goal progressing on improving his shortness of breath with ADLs. Osiris is currently exercising on the up right bike and walking the track. He is slowly increasing his lap count, METs and workload. His shortness of breath scale while exercising has been WNL. Zorian will continue to benefit from PR for nutrition, education, exercise, and lifestyle modification.    Expected Outcomes -- For Summit to lose weight, develop more efficient breathing techniques such as purse lipped breathing and diaphragmatic breathing; and practice self-pacing with activity, and improve his shortness of breath with ADLs. Pt will show progress toward meeting expected goals and outcomes. Pt will show progress toward meeting expected goals and outcomes.             Core Components/Risk Factors/Patient Goals at Discharge (Final Review):   Goals and Risk Factor Review - 10/01/23 1513       Core Components/Risk Factors/Patient Goals Review   Personal Goals Review Weight Management/Obesity;Improve shortness of breath with ADL's    Review Core components/risk factors/patient goals monthly re-evaluate are as follows: Goal progressing on weight loss. Tee has not lost weight since starting the program, but Mamoru has been working with our dietician and has been educated on calorie deficit and the plate method. Goal progressing on improving his shortness of breath with ADLs. Yotam is currently exercising on the up right bike and walking the track. He is slowly increasing his lap count, METs and workload. His shortness of breath scale while exercising has been WNL. Nissan will  continue to benefit from PR for nutrition, education, exercise, and lifestyle modification.    Expected Outcomes Pt will show progress toward meeting expected goals and outcomes.             ITP Comments: Pt is making expected progress toward Pulmonary Rehab goals after completing 17 session(s). Recommend continued exercise, life style modification, education, and utilization of breathing techniques to increase stamina and strength, while also decreasing shortness of breath with exertion.    Comments: Dr. Genetta Kenning is Medical Director for Pulmonary Rehab at [facility].

## 2023-10-04 ENCOUNTER — Encounter (HOSPITAL_COMMUNITY)
Admission: RE | Admit: 2023-10-04 | Discharge: 2023-10-04 | Disposition: A | Discharge status: Home / Self Care | Source: Ambulatory Visit | Attending: Pulmonary Disease | Admitting: Pulmonary Disease

## 2023-10-04 DIAGNOSIS — J449 Chronic obstructive pulmonary disease, unspecified: Secondary | ICD-10-CM | POA: Diagnosis not present

## 2023-10-04 NOTE — Progress Notes (Signed)
 Daily Session Note  Patient Details  Name: Matthew Patrick MRN: [id number] Date of Birth: 13-Aug-1945 Referring Provider:   Gattis Kass Pulmonary Rehab Walk Test from 07/18/2023 in [facility]  Referring Provider Briones    Encounter Date: 10/04/2023  Check In:  Session Check In - 10/04/23 1353       Check-In   Supervising physician immediately available to respond to emergencies [facility] MD immediately available    Physician(s) Theresia Flasher, NP    Location [facility]    Staff Present Sueellen Emery BS, ACSM-CEP, Exercise Physiologist;Kaylee Nolon Baxter, MS, ACSM-CEP, Exercise Physiologist;Trella Thurmond Carmen Chol, RN, BSN    Virtual Visit No    Medication changes reported     No    Fall or balance concerns reported    No    Tobacco Cessation No Change    Warm-up and Cool-down Performed as group-led instruction    Resistance Training Performed Yes    VAD Patient? No    PAD/SET Patient? No      Pain Assessment   Currently in Pain? No/denies    Multiple Pain Sites No          Capillary Blood Glucose: No results found for this or any previous visit (from the past 24 hours).    Social History   Tobacco Use  Smoking Status Former   Current packs/day: 0.00   Average packs/day: 1 pack/day for 5.0 years (5.0 ttl pk-yrs)   Types: Cigarettes   Start date: 04/24/1962   Quit date: 04/25/1967   Years since quitting: 56.4  Smokeless Tobacco Never    Goals Met:  Proper associated with RPD/PD & O2 Sat Independence with exercise equipment Exercise tolerated well No report of concerns or symptoms today Strength training completed today  Goals Unmet:  Not Applicable  Comments: Service time is from 1314 to 1428.    Dr. Genetta Kenning is Medical Director for Pulmonary Rehab at [facility].

## 2023-10-09 ENCOUNTER — Encounter (HOSPITAL_COMMUNITY)
Admission: RE | Admit: 2023-10-09 | Discharge: 2023-10-09 | Disposition: A | Discharge status: Home / Self Care | Source: Ambulatory Visit | Attending: Pulmonary Disease | Admitting: Pulmonary Disease

## 2023-10-09 DIAGNOSIS — J449 Chronic obstructive pulmonary disease, unspecified: Secondary | ICD-10-CM

## 2023-10-09 NOTE — Progress Notes (Signed)
 Daily Session Note  Patient Details  Name: Matthew Patrick MRN: [id number] Date of Birth: [birth year] Referring Provider:   Gattis Kass Pulmonary Rehab Walk Test from 07/18/2023 in [facility]  Referring Provider Briones    Encounter Date: 10/09/2023  Check In:  Session Check In - 10/09/23 1353       Check-In   Supervising physician immediately available to respond to emergencies [facility] MD immediately available    Physician(s) Morey Ar, NP    Location [facility]    Staff Present Sueellen Emery BS, ACSM-CEP, Exercise Physiologist;Kaylee Nolon Baxter, MS, ACSM-CEP, Exercise Physiologist;Casey Carmen Chol, RN, BSN    Virtual Visit No    Medication changes reported     No    Fall or balance concerns reported    No    Tobacco Cessation No Change    Warm-up and Cool-down Performed as group-led instruction    Resistance Training Performed Yes    VAD Patient? No    PAD/SET Patient? No      Pain Assessment   Currently in Pain? No/denies    Multiple Pain Sites No          Capillary Blood Glucose: No results found for this or any previous visit (from the past 24 hours).    Social History   Tobacco Use  Smoking Status Former   Current packs/day: 0.00   Average packs/day: 1 pack/day for 5.0 years (5.0 ttl pk-yrs)   Types: Cigarettes   Start date: 04/24/1962   Quit date: 04/25/1967   Years since quitting: 56.4  Smokeless Tobacco Never    Goals Met:  Independence with exercise equipment Exercise tolerated well No report of concerns or symptoms today Strength training completed today  Goals Unmet:  Not Applicable  Comments: Service time is from 1317 to 1435.    Dr. Genetta Kenning is Medical Director for Pulmonary Rehab at [facility].

## 2023-10-11 ENCOUNTER — Encounter (HOSPITAL_COMMUNITY)
Admission: RE | Admit: 2023-10-11 | Discharge: 2023-10-11 | Disposition: A | Discharge status: Home / Self Care | Source: Ambulatory Visit | Attending: Pulmonary Disease | Admitting: Pulmonary Disease

## 2023-10-11 DIAGNOSIS — J449 Chronic obstructive pulmonary disease, unspecified: Secondary | ICD-10-CM

## 2023-10-11 NOTE — Progress Notes (Signed)
 Discharge Progress Report  Patient Details  Name: Matthew Patrick MRN: [id number] Date of Birth: 1945-12-28 Referring Provider:   Gattis Kass Pulmonary Rehab Walk Test from 07/18/2023 in [facility]  Referring Provider Briones     Number of Visits: 20  Reason for Discharge:  Patient reached a stable level of exercise. Patient independent in their exercise. Patient has met program and personal goals.  Smoking History:  Social History   Tobacco Use  Smoking Status Former   Current packs/day: 0.00   Average packs/day: 1 pack/day for 5.0 years (5.0 ttl pk-yrs)   Types: Cigarettes   Start date: 04/24/1962   Quit date: 04/25/1967   Years since quitting: 56.5  Smokeless Tobacco Never    Diagnosis:  Stage 2 moderate COPD by GOLD classification (HCC)  ADL [facility]:  Pulmonary Assessment Scores     Row Name 07/18/23 1323 07/18/23 1342 10/09/23 1501     ADL [facility]   ADL Phase -- Entry Exit   SOB Score total -- 38 --     CAT Score   CAT Score -- 19 --     mMRC Score   mMRC Score -- 1 0      Initial Exercise Prescription:  Initial Exercise Prescription - 07/18/23 1400       Date of Initial Exercise RX and Referring Provider   Date 07/18/23    Referring Provider Briones    Expected Discharge Date 10/11/23      Treadmill   MPH 2    Grade 0    Minutes 15    METs 2.5      Bike   Level 3    Minutes 15    METs 3      Prescription Details   Frequency (times per week) 2    Duration Progress to 30 minutes of continuous aerobic without signs/symptoms of physical distress      Intensity   THRR 40-80% of Max Heartrate 57-114    Ratings of Perceived Exertion 11-13    Perceived Dyspnea 0-4      Progression   Progression Continue to progress workloads to maintain intensity without signs/symptoms of physical distress.      Resistance Training   Training Prescription Yes    Weight --   black bands   Reps 10-15           Discharge Exercise Prescription (Final Exercise Prescription Changes):  Exercise Prescription Changes - 10/02/23 1400       Response to Exercise   Blood Pressure (Admit) 98/58    Blood Pressure (Exercise) 154/58    Blood Pressure (Exit) 108/72    Heart Rate (Admit) 57 bpm    Heart Rate (Exercise) 92 bpm    Heart Rate (Exit) 83 bpm    Oxygen Saturation (Admit) 94 %    Oxygen Saturation (Exercise) 92 %    Oxygen Saturation (Exit) 94 %    Rating of Perceived Exertion (Exercise) 11    Perceived Dyspnea (Exercise) 1    Duration Continue with 30 min of aerobic exercise without signs/symptoms of physical distress.    Intensity THRR unchanged      Progression   Progression Continue to progress workloads to maintain intensity without signs/symptoms of physical distress.      Resistance Training   Training Prescription Yes    Weight blue bands    Reps 10-15    Time 10 Minutes      Treadmill  MPH 2    Grade 1    Minutes 15    METs 2.6      Bike   Level 4.5    Minutes 15    METs 3.4      Home Exercise Plan   Plans to continue exercise at Home (comment)   walking or gym   Frequency Add 2 additional days to program exercise sessions.    Initial Home Exercises Provided 10/02/23          Functional Capacity:  6 Minute Walk     Row Name 07/18/23 1416 10/09/23 1451       6 Minute Walk   Phase Initial Discharge    Distance 1510 feet 1740 feet    Distance % Change -- 15.23 %    Distance Feet Change -- 230 ft    Walk Time 6 minutes 6 minutes    # of Rest Breaks 0 0    MPH 2.86 3.3    METS 3.43 4.03    RPE 13 11    Perceived Dyspnea  2 2    VO2 Peak 12.01 14.11    Symptoms No No    Resting HR 75 bpm 70 bpm    Resting BP 122/60 118/58    Resting Oxygen Saturation  95 % 95 %    Exercise Oxygen Saturation  during 6 min walk 95 % 91 %    Max Ex. HR 2.66 bpm 113 bpm    Max Ex. BP 186/70 206/70    2 Minute Post BP 150/70 166/66      Interval HR   1 Minute HR  97 86    2 Minute HR 103 102    3 Minute HR 103 112    4 Minute HR 104 112    5 Minute HR 108 113    6 Minute HR 110 113    2 Minute Post HR 93 84    Interval Heart Rate? Yes --      Interval Oxygen   Interval Oxygen? Yes --    Baseline Oxygen Saturation % 95 % 995 %    1 Minute Oxygen Saturation % 97 % 92 %    1 Minute Liters of Oxygen 0 L 0 L    2 Minute Oxygen Saturation % 95 % 97 %    2 Minute Liters of Oxygen 0 L 0 L    3 Minute Oxygen Saturation % 96 % 93 %    3 Minute Liters of Oxygen 0 L 0 L    4 Minute Oxygen Saturation % 96 % 94 %    4 Minute Liters of Oxygen 0 L 0 L    5 Minute Oxygen Saturation % 96 % 91 %    5 Minute Liters of Oxygen 0 L 0 L    6 Minute Oxygen Saturation % 96 % 94 %    6 Minute Liters of Oxygen 0 L 0 L    2 Minute Post Oxygen Saturation % 97 % 99 %    2 Minute Post Liters of Oxygen 0 L 0 L       Psychological, QOL, Others - Outcomes: PHQ 2/9:    07/18/2023    2:30 PM 11/19/2017    3:57 PM 11/19/2017    2:18 PM  Depression screen PHQ 2/9  Decreased Interest 2 3 3   Down, Depressed, Hopeless 0 3 3  PHQ - 2 Score 2 6 6   Altered sleeping 1 3  3  Tired, decreased energy 1 3 3   Change in appetite 0 0 0  Feeling bad or failure about yourself  0 3 3  Trouble concentrating 0 3 3  Moving slowly or fidgety/restless 0 3 3  Suicidal thoughts 0 0 0  PHQ-9 Score 4 21 21   Difficult doing work/chores Not difficult at all Extremely dIfficult Very difficult    Quality of Life:   Personal Goals: Goals established at orientation with interventions provided to work toward goal.  Personal Goals and Risk Factors at Admission - 07/18/23 1328       Core Components/Risk Factors/Patient Goals on Admission    Weight Management Weight Loss;Obesity    Improve shortness of breath with ADL's Yes    Intervention Provide education, individualized exercise plan and daily activity instruction to help decrease symptoms of SOB with activities of daily living.     Expected Outcomes Short Term: Improve cardiorespiratory fitness to achieve a reduction of symptoms when performing ADLs;Long Term: Be able to perform more ADLs without symptoms or delay the onset of symptoms           Personal Goals Discharge:  Goals and Risk Factor Review     Row Name 08/01/23 0946 08/01/23 0949 08/31/23 1205 10/01/23 1513 10/11/23 1604     Core Components/Risk Factors/Patient Goals Review   Personal Goals Review -- Weight Management/Obesity;Improve shortness of breath with ADL's;Develop more efficient breathing techniques such as purse lipped breathing and diaphragmatic breathing and practicing self-pacing with activity.;Increase knowledge of respiratory medications and ability to use respiratory devices properly. Weight Management/Obesity;Improve shortness of breath with ADL's;Develop more efficient breathing techniques such as purse lipped breathing and diaphragmatic breathing and practicing self-pacing with activity. Weight Management/Obesity;Improve shortness of breath with ADL's Weight Management/Obesity;Improve shortness of breath with ADL's   Review -- Core components/risk factors/patient goals monthly re-evaluate are as follows:   Matthew Patrick has completed 3 sessions so far. Goal progressing on weight loss. He has been working with our dietician and she has educated him on calorie deficit and the plate method. Goal progressing on improving his shortness of breath with ADLs. Matthew Patrick is currently exercising on the up right bike and walking the track. He is slowly increasing his lap count, METs and workload. His shortness of breath scale while exercising have been WNL. Goal met for increasing his knowledge of respiratory medications and the ability to use respiratory devices properly. Our respiratory therapist has worked with him and reviewed medication names, usage, directions, and side effects. Matthew Patrick understands without assistance and all questions answered. Goal in progress for developing  more efficient breathing techniques such as purse lipped breathing and diaphragmatic breathing and practicing self-pacing with activity. We have begun teaching Yancey how to use and incorporate breathing techniques. Matthew Patrick can self-pace while walking the track. Matthew Patrick will continue to benefit from PR for nutrition, education, exercise, and lifestyle modification. Core components/risk factors/patient goals monthly re-evaluate are as follows: Goal progressing on weight loss. Zvi has not lost weight since starting the program, but Breken has been working with our dietician and has been educated on calorie deficit and the plate method. Goal progressing on improving his shortness of breath with ADLs. Mateo is currently exercising on the up right bike and walking the track. He is slowly increasing his lap count, METs and workload. His shortness of breath scale while exercising has been WNL. Goal met for developing more efficient breathing techniques such as purse lipped breathing and diaphragmatic breathing and practicing self-pacing with activity. Matthew Patrick  can initiate PLB independently and can pace himself while walking the track. He is practicing diaphragmatic breathing at home. Matthew Patrick will continue to benefit from PR for nutrition, education, exercise, and lifestyle modification. Core components/risk factors/patient goals monthly re-evaluate are as follows: Goal progressing on weight loss. Matthew Patrick has not lost weight since starting the program, but Matthew Patrick has been working with our dietician and has been educated on calorie deficit and the plate method. Goal progressing on improving his shortness of breath with ADLs. Matthew Patrick is currently exercising on the up right bike and walking the track. He is slowly increasing his lap count, METs and workload. His shortness of breath scale while exercising has been WNL. Matthew Patrick will continue to benefit from PR for nutrition, education, exercise, and lifestyle modification. Matthew Patrick graduated from the program 10/11/23  completing 20 sessions. Core components/risk factors/patient goals discharge re-evaluate are as follows: Goal not met on weight loss. Matthew Patrick has not lost weight during his time in the program. Matthew Patrick worked with our dietician and has been educated on calorie deficit and the plate method. He stated he would keep working on weight loss after graduation. Goal met on improving his shortness of breath with ADLs. Matthew Patrick exercised on the up right bike and walked the track. He was able to increase his lap count, METs and workload. His shortness of breath scale while exercising has been WNL and he was able to maintain his oxygen saturation on room air. His MMRC decreased from a 1 to a 0.   Expected Outcomes -- For Matthew Patrick to lose weight, develop more efficient breathing techniques such as purse lipped breathing and diaphragmatic breathing; and practice self-pacing with activity, and improve his shortness of breath with ADLs. Pt will show progress toward meeting expected goals and outcomes. Pt will show progress toward meeting expected goals and outcomes. Pt will show progress toward meeting expected goals and outcomes post Pulm Rehab      Exercise Goals and Review:  Exercise Goals     Row Name 07/18/23 1321             Exercise Goals   Increase Physical Activity Yes       Intervention Provide advice, education, support and counseling about physical activity/exercise needs.;Develop an individualized exercise prescription for aerobic and resistive training based on initial evaluation findings, risk stratification, comorbidities and participant's personal goals.       Expected Outcomes Short Term: Attend rehab on a regular basis to increase amount of physical activity.;Long Term: Exercising regularly at least 3-5 days a week.;Long Term: Add in home exercise to make exercise part of routine and to increase amount of physical activity.       Increase Strength and Stamina Yes       Intervention Provide advice, education,  support and counseling about physical activity/exercise needs.;Develop an individualized exercise prescription for aerobic and resistive training based on initial evaluation findings, risk stratification, comorbidities and participant's personal goals.       Expected Outcomes Short Term: Increase workloads from initial exercise prescription for resistance, speed, and METs.;Short Term: Perform resistance training exercises routinely during rehab and add in resistance training at home;Long Term: Improve cardiorespiratory fitness, muscular endurance and strength as measured by increased METs and functional capacity ( )       Able to understand and use rate of perceived exertion (RPE) scale Yes       Intervention Provide education and explanation on how to use RPE scale       Expected Outcomes  Short Term: Able to use RPE daily in rehab to express subjective intensity level;Long Term:  Able to use RPE to guide intensity level when exercising independently       Able to understand and use Dyspnea scale Yes       Intervention Provide education and explanation on how to use Dyspnea scale       Expected Outcomes Short Term: Able to use Dyspnea scale daily in rehab to express subjective sense of shortness of breath during exertion;Long Term: Able to use Dyspnea scale to guide intensity level when exercising independently       Knowledge and understanding of Target Heart Rate Range (THRR) Yes       Intervention Provide education and explanation of THRR including how the numbers were predicted and where they are located for reference       Expected Outcomes Short Term: Able to state/look up THRR;Short Term: Able to use daily as guideline for intensity in rehab;Long Term: Able to use THRR to govern intensity when exercising independently       Understanding of Exercise Prescription Yes       Intervention Provide education, explanation, and written materials on patient's individual exercise prescription        Expected Outcomes Short Term: Able to explain program exercise prescription;Long Term: Able to explain home exercise prescription to exercise independently          Exercise Goals Re-Evaluation:  Exercise Goals Re-Evaluation     Row Name 08/03/23 0739 08/31/23 1403 09/24/23 0745 10/11/23 1540       Exercise Goal Re-Evaluation   Exercise Goals Review Increase Physical Activity;Able to understand and use Dyspnea scale;Understanding of Exercise Prescription;Increase Strength and Stamina;Knowledge and understanding of Target Heart Rate Range (THRR);Able to understand and use rate of perceived exertion (RPE) scale Increase Physical Activity;Able to understand and use Dyspnea scale;Understanding of Exercise Prescription;Increase Strength and Stamina;Knowledge and understanding of Target Heart Rate Range (THRR);Able to understand and use rate of perceived exertion (RPE) scale Increase Physical Activity;Able to understand and use Dyspnea scale;Understanding of Exercise Prescription;Increase Strength and Stamina;Knowledge and understanding of Target Heart Rate Range (THRR);Able to understand and use rate of perceived exertion (RPE) scale Increase Physical Activity;Able to understand and use Dyspnea scale;Understanding of Exercise Prescription;Increase Strength and Stamina;Knowledge and understanding of Target Heart Rate Range (THRR);Able to understand and use rate of perceived exertion (RPE) scale    Comments Pt has completed 2 1/2 exercise sessions. He needed to leave the last class early due to significant SOB. He missed the last session for a MD appt. Pt is walking the track for 15 min, METs 3 at the most. He then is exercising on the scifit bike for 15 min, level 3, METs 3.3. He is motivated. Performs warm up and cool down without limitations, black bands. Will progress as tolerated. Pt has completed 10 exercise sessions, missing 2 for appointments.  Pt is walking the track for 15 min, METs 3.15. He then is  exercising on the scifit bike for 15 min, level 4, METs 3.4. He is motivated. Will discuss moving to the treadmill although he felt insecure the first attempt. Performs warm up and cool down without limitations, black bands. Will progress as tolerated. Pt has completed 15 exercise sessions.  Pt is now walking on the treadmill for 15 min, METs 2.2. His METs have decreased on the treadmill as he has worked on getting accustomed to it. Will increase incline next session. He then is exercising on the  scifit bike for 15 min, level 4, METs 3.2. Will increase level next session.He is motivated.  Performs warm up and cool down without limitations, black bands. Will progress as tolerated. Pt completed 20 exercise sessions. He was successful in the program. He increased his 6 min walk test by 15 %, 230 ft. He increased his METs from 2.85 to 3.1 with walking. He was thankful for program and plans to attend [facility]    Expected Outcomes Through exercise at rehab and at home, patient will increase physical capacity and ADL's will be easier to perform. The patient will also feel comfortable establishing a home exercise program. Through exercise at rehab and at home, patient will increase physical capacity and ADL's will be easier to perform. The patient will also feel comfortable establishing a home exercise program. Through exercise at rehab and at home, patient will increase physical capacity and ADL's will be easier to perform. The patient will also feel comfortable establishing a home exercise program. Through exercise at rehab and at home, patient will increase physical capacity and ADL's will be easier to perform. The patient will also feel comfortable establishing a home exercise program.       Nutrition & Weight - Outcomes:  Pre Biometrics - 07/18/23 1423       Pre Biometrics   Grip Strength 29 kg          Post Biometrics - 10/09/23 1501        Post  Biometrics   Grip Strength 30 kg           Nutrition:  Nutrition Therapy & Goals - 10/02/23 1436       Nutrition Therapy   Diet General Healthy Diet    Drug/Food Interactions Statins/Certain Fruits      Personal Nutrition Goals   Nutrition Goal Patient to improve diet quality by using the plate method as a guide for meal planning to include lean protein/plant protein, fruits, vegetables, whole grains, nonfat dairy as part of a well-balanced diet.   goal in progress.   Personal Goal #2 Patient to identify strategies for weight loss of 0.5-2.0# per week.   goal not met.   Comments Goals in progress. Matthew Patrick has medical history of HTN, OSA, COPD. Matthew Patrick reports motivation to lose weight; we have discussed multiple strategies for creating calorie deficit including calorie density, the plate method as aguide for meal planning, decreasing snacking, etc. Patient has not lost weight but maintained weight since starting with our program. Patient will benefit from participation in pulmonary rehab for nutrition, exercise, and lifestyle modification.      Intervention Plan   Intervention Prescribe, educate and counsel regarding individualized specific dietary modifications aiming towards targeted core components such as weight, hypertension, lipid management, diabetes, heart failure and other comorbidities.;Nutrition handout(s) given to patient.    Expected Outcomes Short Term Goal: Understand basic principles of dietary content, such as calories, fat, sodium, cholesterol and nutrients.;Long Term Goal: Adherence to prescribed nutrition plan.          Nutrition Discharge:   Education Questionnaire Score:  Knowledge Questionnaire Score - 07/18/23 1429       Knowledge Questionnaire Score   Pre Score 16/18         Matthew Patrick graduated from the program on 10/11/23 completing 20 sessions. Psychosocial discharge re-evaluation is as follows: No changes since last assessment. Matthew Patrick is still under the care of the [place] and sees a therapist and psychiatrist  regularly. He is compliant with taking his  psychotropic meds. Matthew Patrick denies any additional needs or resources at the time of program completion   Core components/risk factors/patient goals discharge re-evaluate are as follows: Goal not met on weight loss. Matthew Patrick has not lost weight during his time in the program. Matthew Patrick worked with our dietician and has been educated on calorie deficit and the plate method. He stated he would keep working on weight loss after graduation. Goal met on improving his shortness of breath with ADLs. Matthew Patrick exercised on the up right bike and walked the track. He was able to increase his lap count, METs and workload. His shortness of breath scale while exercising has been WNL and he was able to maintain his oxygen saturation on room air. His MMRC decreased from a 1 to a 0.   Goals reviewed with patient; copy given to patient.

## 2023-10-11 NOTE — Progress Notes (Signed)
 Daily Session Note  Patient Details  Name: Matthew Patrick MRN: [id number] Date of Birth: 08/20/1945 Referring Provider:   Gattis Kass Pulmonary Rehab Walk Test from 07/18/2023 in [facility]  Referring Provider Briones    Encounter Date: 10/11/2023  Check In:  Session Check In - 10/11/23 1327       Check-In   Supervising physician immediately available to respond to emergencies [facility] MD immediately available    Physician(s) Lawana Pray, NP    Location [facility]    Staff Present Sueellen Emery BS, ACSM-CEP, Exercise Physiologist;Rebekka Lobello Nolon Baxter, MS, ACSM-CEP, Exercise Physiologist;Casey Carmen Chol, RN, BSN    Virtual Visit No    Medication changes reported     No    Fall or balance concerns reported    No    Tobacco Cessation No Change    Warm-up and Cool-down Performed as group-led instruction    Resistance Training Performed Yes    VAD Patient? No    PAD/SET Patient? No      Pain Assessment   Currently in Pain? No/denies    Multiple Pain Sites No          Capillary Blood Glucose: No results found for this or any previous visit (from the past 24 hours).    Social History   Tobacco Use  Smoking Status Former   Current packs/day: 0.00   Average packs/day: 1 pack/day for 5.0 years (5.0 ttl pk-yrs)   Types: Cigarettes   Start date: 04/24/1962   Quit date: 04/25/1967   Years since quitting: 56.5  Smokeless Tobacco Never    Goals Met:  Proper associated with RPD/PD & O2 Sat Independence with exercise equipment Exercise tolerated well No report of concerns or symptoms today Strength training completed today  Goals Unmet:  Not Applicable  Comments: Service time is from 1256 to 1441.    Dr. Genetta Kenning is Medical Director for Pulmonary Rehab at [facility]

## 2023-12-06 ENCOUNTER — Encounter (HOSPITAL_BASED_OUTPATIENT_CLINIC_OR_DEPARTMENT_OTHER): Payer: Self-pay | Admitting: Internal Medicine

## 2023-12-06 DIAGNOSIS — G4733 Obstructive sleep apnea (adult) (pediatric): Secondary | ICD-10-CM

## 2024-01-30 ENCOUNTER — Ambulatory Visit (HOSPITAL_BASED_OUTPATIENT_CLINIC_OR_DEPARTMENT_OTHER): Admitting: Internal Medicine

## 2024-01-30 DIAGNOSIS — G4733 Obstructive sleep apnea (adult) (pediatric): Secondary | ICD-10-CM | POA: Insufficient documentation

## 2024-02-03 DIAGNOSIS — G4733 Obstructive sleep apnea (adult) (pediatric): Secondary | ICD-10-CM

## 2024-02-03 NOTE — Procedures (Addendum)
  Indications for Polysomnography The patient is a 78 year old Male who is 5' 10 and weighs 184.0 lbs. His BMI equals 26.6.  A full night titration treatment study was performed. [facility]  No MedicationsNo Data. Polysomnogram Data A full night polysomnogram recorded the standard physiologic parameters including EEG, EOG, EMG, EKG, nasal and oral airflow.  Respiratory parameters of chest and abdominal movements were recorded with Respiratory Inductance Plethysmography belts.   Oxygen saturation was recorded by pulse oximetry.  Sleep Architecture The total recording time of the polysomnogram was 435.3 minutes.  The total sleep time was 308.0 minutes.  The patient spent 15.9% of total sleep time in Stage N1, 65.9% in Stage N2, 4.2% in Stages N3, and 14.0% in REM.  Sleep latency was 18.2 minutes.   REM latency was 215.0 minutes.  Sleep Efficiency was 70.8%.  Wake after Sleep Onset time was 109.5 minutes.  Titration Summary The patient was titrated at pressures ranging from 5/2* cm/H20 with supplemental oxygen at - up to 10/2* cm/H20 with supplemental oxygen at -.  The last pressure used in the study was 10/2* cm/H20 with supplemental oxygen at -.  Respiratory Events The polysomnogram revealed a presence of - obstructive, - central, and - mixed apneas resulting in an Apnea index of - events per hour.  There were 7 hypopneas (GreaterEqual to3% desaturation and/or arousal) resulting in an Apnea\Hypopnea Index (AHI  GreaterEqual to3% desaturation and/or arousal) of 1.4 events per hour.  There were - hypopneas (GreaterEqual to4% desaturation) resulting in an Apnea\Hypopnea Index (AHI GreaterEqual to4% desaturation) of - events per hour.  There were 41 Respiratory  Effort Related Arousals resulting in a RERA index of 8.0 events per hour. The Respiratory Disturbance Index is 9.4 events per hour.  The snore index was - events per hour.  Mean oxygen saturation was 90.5%.  The lowest oxygen  saturation during sleep was 86.0%.  Time spent LessEqual to88% oxygen saturation was  minutes ().  Limb Activity There were 66 limb movements recorded.  Of this total, 66 were classified as PLMs.  Of the PLMs, 31 were associated with arousals.  The Limb Movement index was 12.9 per hour while the PLM index was 12.9 per hour.  Cardiac Summary The average pulse rate was 58.3 bpm.  The minimum pulse rate was 51.0 bpm while the maximum pulse rate was 79.0 bpm.  Cardiac rhythm was normal- PVCs.  Comments: CPAP titration to 10 cwp, EPR 2 with residual AHI(4%) 0/hr, desaturation to 88%, mean 90.3%.  Diagnosis: Obstructive sleep apnea  Recommendations: Suggest autopap 5-15 cwp or fixed CPAP 10 cwp, EPR 2. Patient wore a medium ResMed AirFit F-20 full face mask with heated humidity.   This study was personally reviewed and electronically signed by: Dr. Reggy Salt Accredited Board Certified in Sleep Medicine Date/Time: 02/03/24   12:17

## 2024-02-03 NOTE — Procedures (Signed)
 [facility] [address] [place], [place] [address] Tel: [phone]   Fax: [phone]  Titration Interpretation  Patient Name:  Matthew Patrick, Matthew Patrick Study Date:  01/30/2024 Referring Physician:  JERELD BOOS [phone]) %%startinterp%% Indications for Polysomnography The patient is a 78 year old Male who is 5' 10 and weighs 184.0 lbs. His BMI equals 26.6.  A full night [facility]ration treatment [facility] was performed. [facility]  No Medications  No Data.   Polysomnogram Data A full night polysomnogram recorded the standard physiologic parameters including EEG, EOG, EMG, EKG, nasal and oral airflow.  Respiratory parameters of chest and abdominal movements were recorded with Respiratory Inductance Plethysmography belts.  Oxygen saturation was recorded by pulse oximetry.   Sleep Architecture The total recording time of the polysomnogram was 435.3 minutes.  The total sleep time was 308.0 minutes.  The patient spent 15.9% of total sleep time in Stage N1, 65.9% in Stage N2, 4.2% in Stages N3, and 14.0% in REM.  Sleep latency was 18.2 minutes.  REM latency was 215.0 minutes.  Sleep Efficiency was 70.8%.  Wake after Sleep Onset time was 109.5 minutes.  Titration Summary The patient was titrated at pressures ranging from 5/2* cm/H20 with supplemental oxygen at - up to 10/2* cm/H20 with supplemental oxygen at -.  The last pressure used in the study was 10/2* cm/H20 with supplemental oxygen at -.  Respiratory Events The polysomnogram revealed a presence of - obstructive, - central, and - mixed apneas resulting in an Apnea index of - events per hour.  There were 7 hypopneas (>=3% desaturation and/or arousal) resulting in an Apnea\Hypopnea Index (AHI >=3% desaturation and/or arousal) of 1.4 events per hour.  There were - hypopneas (>=4% desaturation) resulting in an Apnea\Hypopnea Index (AHI >=4% desaturation) of - events per hour.  There were 41 Respiratory  Effort Related Arousals resulting in a RERA index of 8.0 events per hour. The Respiratory Disturbance Index is 9.4 events per hour.  The snore index was - events per hour.  Mean oxygen saturation was 90.5%.  The lowest oxygen saturation during sleep was 86.0%.  Time spent <=88% oxygen saturation was 8.8 minutes (2.0%).  Limb Activity There were 66 limb movements recorded.  Of this total, 66 were classified as PLMs.  Of the PLMs, 31 were associated with arousals.  The Limb Movement index was 12.9 per hour while the PLM index was 12.9 per hour.  Cardiac Summary The average pulse rate was 58.3 bpm.  The minimum pulse rate was 51.0 bpm while the maximum pulse rate was 79.0 bpm.  Cardiac rhythm was normal- PVCs.  Comments: CPAP titration to 10 cwp, EPR 2 with residual AHI(4%) 0/hr, desaturation to 88%, mean 90.3%.  Diagnosis: Obstructive sleep apnea  Recommendations: Suggest autopap 5-15 cwp or fixed CPAP 10 cwp, EPR 2. Patient wore a medium ResMed AirFit F-20 full face mask with heated humidity.   This study was personally reviewed and electronically signed by: Dr. Reggy Salt Accredited Board Certified in Sleep Medicine Date/Time: 02/03/24   12:17    %%endinterp%%  Titration Report  Patient Name: Matthew, Patrick Study Date: 01/30/2024  Date of Birth: 1945/12/26 Study Type: CPAP Titration  Age: 58 year MRN #: [id number]  Sex: Male Interpreting Physician: SALT REGGY, [id number]  Height: 5' 10 Referring Physician: JERELD BOOS [phone])  Weight: 184.0 lbs Recording Tech: Dewane Hacker CRT RPSGT RST  BMI: 26.6 Scoring Tech: Dewane Hacker CRT RPSGT RST  ESS: 2 Neck Size: 15.5  Mask Type RESMED AIRFIT F20 FFM Final Pressure: 10cmH2O  Mask Size: MEDIUM Supplemental O2: N/A   Study Overview  Lights Off: 09:47:48 PM  Count Index  Lights On: 05:03:07 AM Awakenings: 40 7.8  Time in Bed: 435.3 min. Arousals: 160 31.2  Total Sleep Time: 308.0 min. AHI (>=3% Desat and/or Ar.): 7 1.4   Sleep  Efficiency: 70.8% AHI (>=4% Desat): - -   Sleep Latency: 18.2 min. Limb Movements: 66 12.9  Wake After Sleep Onset: 109.5 min. Snore: - -  REM Latency from Sleep Onset: 215.0 min. Desaturations: 12 2.3     Minimum SpO2 TST: 86.0%    Sleep Architecture  % of Time in Bed Stages Time (mins) % Sleep Time  Wake 128.0   Stage N1 49.0 15.9%  Stage N2 203.0 65.9%  Stage N3 13.0 4.2%  REM 43.0 14.0%   Arousal Summary   NREM REM Sleep Index  Respiratory Arousals 46 - 46 9.0  PLM Arousals 31 - 31 6.0  Isolated Limb Movement Arousals - - - -  Snore Arousals - - - -  Spontaneous Arousals 78 5 83 16.2  Total 155 5 160 31.2   Limb Movement Summary   Count Index  Isolated Limb Movements - -  Periodic Limb Movements (PLMs) 66 12.9  Total Limb Movements 66 12.9    Respiratory Summary   By Sleep Stage By Body Position Total   NREM REM Supine Non-Supine   Time (min) 265.0 43.0 72.5 235.5 308.0         Obstructive Apnea - - - - -  Mixed Apnea - - - - -  Central Apnea - - - - -  Total Apneas - - - - -  Total Apnea Index - - - - -         Hypopneas (>=3% Desat and/or Ar.) 7 - 5 2 7   AHI (>=3% Desat and/or Ar.) 1.6 - 4.1 0.5 1.4         Hypopneas (>=4% Desat) - - - - -  AHI (>=4% Desat) - - - - -          RERAs 41 - 15 26 41  RERA Index 9.3 - 12.4 6.6 8.0         RDI 10.9 - 16.6 7.1 9.4     Respiratory Event Durations   Apnea Hypopnea   NREM REM NREM REM  Average (seconds) - - 16.7 -  Maximum (seconds) - - 26.6 -    Oxygen Saturation Summary   Wake NREM REM TST TIB  Average SpO2 91.3% 90.0% 90.9% 90.1% 90.5%  Minimum SpO2 87.0% 86.0% 89.0% 86.0% 86.0%  Maximum SpO2 96.0% 95.0% 93.0% 95.0% 96.0%   Oxygen Saturation Distribution  Range (%) Time in range (min) Time in range (%)  90.0 - 100.0 199.6 46.0%  80.0 - 90.0 234.0 54.0%  70.0 - 80.0 - -  60.0 - 70.0 - -  50.0 - 60.0 - -  0.0 - 50.0 - -  Time Spent <=88% SpO2  Range (%) Time in range (min) Time in range  (%)  0.0 - 88.0 8.8 2.0%      Count Index  Desaturations 12 2.3    Cardiac Summary   Wake NREM REM Sleep Total  Average Pulse Rate (BPM) 60.0 57.7 57.5 57.6 58.3  Minimum Pulse Rate (BPM) 51.0 51.0 53.0 51.0 51.0  Maximum Pulse Rate (BPM) 79.0 73.0 63.0 73.0 79.0   Pulse Rate Distribution:  Range (  bpm) Time in range (min) Time in range (%)  0.0 - 40.0 - -  40.0 - 60.0 345.7 79.7%  60.0 - 80.0 80.8 18.6%  80.0 - 100.0 - -  100.0 - 120.0 - -  120.0 - 140.0 - -  140.0 - 200.0 - -   Titration Summary  PAP Device PAP Level O2 Level Time (min) Wake (min) NREM (min) REM (min) Supine TST (min) Sleep Eff% OA# CA# MA# Hyp# (>=3%) AHI (>=3%) Hyp# (>=4%) AHI (>=%4) RERA RDI SpO2 <=88% (min) Min SpO2 Mean SpO2 Ar. Index  CPAP EPR 5/2 - 68.5 49.5 19.0 0.0  2.5 27.7% - - - - - -  - 28  88.4  0.0 89.0 90.2 94.7  CPAP EPR 7/2 - 56.0 26.0 30.0 0.0  13.0 53.6% - - - 5 10.0 -  - 12  34.0  5.4 86.0 89.3 102.0  CPAP EPR 9/2 - 17.0 0.0 17.0 0.0  17.0 100.0% - - - - - -  - -  -  0.5 88.0 89.1 7.1  CPAP EPR 10/2 - 294.5 52.5 199.0 43.0  40.0 82.2% - - - 2 0.5 -  - 1  0.7  1.1 88.0 90.3 19.1    Hypnograms                           Technologist Comments  THE 66-YEAR-OLD MALE PATIENT PRESENTED TO THE SLEEP DISORDER CENTER FOR A CPAP TITRATION STUDY WITH A CHIEF COMPLAINT OF OSA. THE PATIENT WAS FITTED WITH A MEDIUM RESMED AIRFIT F20 FULL FACE MASK FOR PRE-CPAP TRIAL, WHICH WAS TOLERATED WELL. NO BEDTIME MEDICATIONS WERE SELF ADMINISTERED. THE CPAP TITRATION WAS EXPLAINED, THE LEAD PLACEMENT WAS INITIATED, THEN THE STUDY WAS BEGUN. THE CPAP TRIAL WAS INITIATED USING THE ABOVE MASK TYPE ON A PRESSURE OF 5cmH2O WITH AN EPR OF 2 AND HEATED HUMIDIFICATION. THE CPAP PRESSURE WAS INCREASED FOR SNORING, RESPIRATORY EVENTS AND RERAs UNTIL OPTIMAL PRESSURE WAS ACHIEVED. THE PATIENT STATED HE PRIMARILY SLEEPS ON HIS RIGHT SIDE. FREQUENT PLMs - PLMAs WERE NOTED. SUPPLEMENTAL OXYGEN  WAS NOT WARRANTED DURING THE STUDY. NO RESTROOM VISITS WERE MADE.  QUESTIONABLE CARDIAC ARRHYTHMIAS WERE OBSERVED. PLEASE SEE ATTACHED SHEETS.  NO OBVIOUS PARASOMNIAs WERE OBSERVED. THE PATIENT TOLERATED THE CPAP TRIAL WELL.                           Reggy Salt Diplomate, Biomedical engineer of Sleep Medicine  ELECTRONICALLY SIGNED ON:  02/03/2024, 12:09 PM [facility] PH: [phone]   FX: [phone] ACCREDITED BY THE AMERICAN ACADEMY OF SLEEP MEDICINE

## 2024-02-29 ENCOUNTER — Telehealth (HOSPITAL_COMMUNITY): Payer: Self-pay

## 2024-02-29 NOTE — Telephone Encounter (Signed)
 Received a [place] auth from the [place] for pt to participate in the pulmonary rehab. Left message for pt to call back.

## 2024-03-18 ENCOUNTER — Telehealth (HOSPITAL_COMMUNITY): Payer: Self-pay

## 2024-03-18 NOTE — Telephone Encounter (Signed)
 Patient's wife left message asking about [facility] referral and if they can have it sent to [facility]. Attempted to call patient back- no answer, left message.

## 2024-03-18 NOTE — Telephone Encounter (Signed)
 Wife called to see if husband could do Pulmonary Rehab at [facility].  Advised that Pulmonary rehab was not offered there.  Patient stated that he would be interested in doing Rehab here.

## 2024-03-28 ENCOUNTER — Telehealth (HOSPITAL_COMMUNITY): Payer: Self-pay

## 2024-03-28 NOTE — Telephone Encounter (Signed)
 Attempted to schedule pulmonary rehab- no answer, left message. Sent MyChart message.

## 2024-04-02 NOTE — ED Notes (Signed)
 Patient ambulated with pulse oximeter. Sats maintained between 92%-95%. Patient denies SOB.

## 2024-04-04 ENCOUNTER — Telehealth (HOSPITAL_COMMUNITY): Payer: Self-pay

## 2024-04-04 NOTE — Telephone Encounter (Signed)
 Called patient's wife to schedule pulmonary rehab. She states they want to wait until January to schedule.

## 2024-04-15 ENCOUNTER — Telehealth (HOSPITAL_COMMUNITY): Payer: Self-pay

## 2024-04-15 ENCOUNTER — Encounter (HOSPITAL_COMMUNITY): Payer: Self-pay

## 2024-04-15 NOTE — Telephone Encounter (Signed)
 Called patient to see if he was interested in participating in the Pulmonary Rehab Program. Patient will come in for orientation on 04/30/2024 and will attend the 1:15 pm exercise class.  Sent package via My Chart.

## 2024-04-15 NOTE — Telephone Encounter (Signed)
 Correction to previous note:  Called patient to see if he was interested in participating in the Pulmonary Rehab Program. Patient will come in for orientation on 05/02/2024 and will attend the 1:15 pm exercise class.

## 2024-04-30 ENCOUNTER — Encounter (HOSPITAL_COMMUNITY)

## 2024-05-01 ENCOUNTER — Telehealth (HOSPITAL_COMMUNITY): Payer: Self-pay

## 2024-05-01 NOTE — Telephone Encounter (Signed)
 Called to confirm appt. Pt confirmed appt. Instructed pt on proper footwear. Gave directions along with department number.

## 2024-05-02 ENCOUNTER — Encounter (HOSPITAL_COMMUNITY): Payer: Self-pay

## 2024-05-02 ENCOUNTER — Encounter (HOSPITAL_COMMUNITY)
Admission: RE | Admit: 2024-05-02 | Discharge: 2024-05-02 | Disposition: A | Discharge status: Home / Self Care | Source: Ambulatory Visit | Attending: Pulmonary Disease | Admitting: Pulmonary Disease

## 2024-05-02 VITALS — BP 132/56 | HR 74 | Wt 195.1 lb

## 2024-05-02 DIAGNOSIS — J449 Chronic obstructive pulmonary disease, unspecified: Secondary | ICD-10-CM | POA: Insufficient documentation

## 2024-05-02 NOTE — Progress Notes (Signed)
 Matthew Patrick 79 y.o. male Pulmonary Rehab Orientation Note This patient who was referred to Pulmonary Rehab by Dr. Tor with the diagnosis of COPD 2 arrived today in Cardiac and Pulmonary Rehab. He arrived ambulatory with normal gait. He does not carry portable oxygen. Adult pediatric specialist is the provider for their DME. Per patient, Matthew Patrick uses oxygen never. Color good, skin warm and dry. Patient is oriented to time and place. Patient's medical history, psychosocial health, and medications reviewed. Psychosocial assessment reveals patient lives with spouse. Matthew Patrick is currently retired. Patient hobbies include making stained glass. Patient reports his stress level is moderate. Areas of stress/anxiety include health. Patient does not exhibit signs of depression. PHQ2/9 score 0/2. Matthew Patrick shows good  coping skills with positive outlook on life. Offered emotional support and reassurance. Will continue to monitor. Physical assessment performed by Nurse pick: Matthew Levin RN. Please see their orientation physical assessment note. Matthew Patrick reports he  does take medications as prescribed. Patient states he  follows a regular  diet. The patient would like to lose weight.. Patient's weight will be monitored closely. Demonstration and practice of PLB using pulse oximeter. Matthew Patrick able to return demonstration satisfactorily. Safety and hand hygiene in the exercise area reviewed with patient. Matthew Patrick voices understanding of the information reviewed. Department expectations discussed with patient and achievable goals were set. The patient shows enthusiasm about attending the program and we look forward to working with Matthew. Matthew Patrick completed a 6 min walk test today and is scheduled to begin exercise on 05/08/24.   [id number] Matthew Patrick, BSRT

## 2024-05-02 NOTE — Progress Notes (Signed)
 Pulmonary Individual Treatment Plan  Patient Details  Name: Matthew Patrick MRN: [id number] Date of Birth: [birth year] Referring Provider:   Flowsheet Row PULMONARY REHAB COPD ORIENTATION from 05/02/2024 in [facility]  Referring Provider Dr. Tor    Initial Encounter Date:  Flowsheet Row PULMONARY REHAB COPD ORIENTATION from 05/02/2024 in [facility]  Date 05/02/24    Visit Diagnosis: Stage 2 moderate COPD by GOLD classification (HCC)  Patient's Home Medications on Admission:  Current Medications[1]  Past Medical History: Past Medical History:  Diagnosis Date   Abnormal findings on diagnostic imaging of lung    Accidental exposure to phenoxyacid derivative herbicide    Actinic keratosis    Allergic rhinitis    Anxiety    Asthma    Benign prostatic hyperplasia    Bipolar affective disorder, mixed, severe, with psychotic behavior (HCC)    Cognitive impairment    Depression    Drug induced akathisia    Dysuria    Exposure to potentially hazardous substance    Functional diarrhea    Hyperlipemia    Hypertension    Insomnia    Lumbago 06/27/2016   Major depressive disorder    Parkinson disease (HCC)    Restless leg syndrome    Sleep apnea    has c-pap    Tremor due to disorder of central nervous system    Vascular dementia without behavioral disturbance (HCC)     Tobacco Use: Tobacco Use History[2]  Labs: Review Flowsheet       Latest Ref Rng & Units 03/26/2011 09/17/2015 04/10/2016 01/11/2017  Labs for ITP Cardiac and Pulmonary Rehab  Cholestrol 0 - 200 mg/dL 875  873  826  - -  LDL (calc) 0 - 99 mg/dL 43  46  87  - -  HDL-C >40 mg/dL 45  44  58  - -  Trlycerides <150 mg/dL 818  821  859  - -  Hemoglobin A1c 4.0 - 6.0 % 5.7  5.7  - -  TCO2 22 - 32 mmol/L - - 28  27     Details       Multiple values from one day are sorted in reverse-chronological  order         Capillary Blood Glucose: Lab Results  Component Value Date   GLUCAP 89 06/07/2021   GLUCAP 79 04/29/2018   GLUCAP 90 01/11/2017     Pulmonary Assessment Scores:  Pulmonary Assessment Scores     Row Name 05/02/24 1033         ADL UCSD   ADL Phase Entry     SOB Score total 8       CAT Score   CAT Score 8       mMRC Score   mMRC Score 1       UCSD: Self-administered rating of dyspnea associated with activities of daily living (ADLs) 6-point scale (0 = not at all to 5 = maximal or unable to do because of breathlessness)  Scoring Scores range from 0 to 120.  Minimally important difference is 5 units  CAT: CAT can identify the health impairment of COPD patients and is better correlated with disease progression.  CAT has a scoring range of zero to 40. The CAT score is classified into four groups of low (less than 10), medium (10 - 20), high (21-30) and very high (31-40) based on the impact level  of disease on health status. A CAT score over 10 suggests significant symptoms.  A worsening CAT score could be explained by an exacerbation, poor medication adherence, poor inhaler technique, or progression of COPD or comorbid conditions.  CAT MCID is 2 points  mMRC: mMRC (Modified Medical Research Council) Dyspnea Scale is used to assess the degree of baseline functional disability in patients of respiratory disease due to dyspnea. No minimal important difference is established. A decrease in score of 1 point or greater is considered a positive change.   Pulmonary Function Assessment:  Pulmonary Function Assessment - 05/02/24 1039       Breath   Bilateral Breath Sounds Clear    Shortness of Breath Yes;Limiting activity          Exercise Target Goals: Exercise Program Goal: Individual exercise prescription set using results from initial 6 min walk test and THRR while considering  patients activity barriers and safety.   Exercise Prescription  Goal: Initial exercise prescription builds to 30-45 minutes a day of aerobic activity, 2-3 days per week.  Home exercise guidelines will be given to patient during program as part of exercise prescription that the participant will acknowledge.  Activity Barriers & Risk Stratification:  Activity Barriers & Cardiac Risk Stratification - 05/02/24 1039       Activity Barriers & Cardiac Risk Stratification   Activity Barriers Muscular Weakness;Shortness of Breath;Deconditioning    Cardiac Risk Stratification Moderate          6 Minute Walk:  6 Minute Walk     Row Name 05/02/24 1117         6 Minute Walk   Phase Initial     Distance 1294 feet     Walk Time 6 minutes     # of Rest Breaks 0     MPH 2.45     METS 2.72     RPE 6     Perceived Dyspnea  0     VO2 Peak 9.52     Symptoms No     Resting HR 65 bpm     Resting BP 132/58     Resting Oxygen Saturation  94 %     Exercise Oxygen Saturation  during 6 min walk 91 %     Max Ex. HR 99 bpm     Max Ex. BP 166/60     2 Minute Post BP 138/60       Interval HR   1 Minute HR 86     2 Minute HR 95     3 Minute HR 99     4 Minute HR 92     5 Minute HR 91     6 Minute HR 90     2 Minute Post HR 71     Interval Heart Rate? Yes       Interval Oxygen   Interval Oxygen? Yes     Baseline Oxygen Saturation % 94 %     1 Minute Oxygen Saturation % 94 %     1 Minute Liters of Oxygen 0 L     2 Minute Oxygen Saturation % 92 %     2 Minute Liters of Oxygen 0 L     3 Minute Oxygen Saturation % 91 %     3 Minute Liters of Oxygen 0 L     4 Minute Oxygen Saturation % 93 %     4 Minute Liters of Oxygen 0 L     5  Minute Oxygen Saturation % 93 %     5 Minute Liters of Oxygen 0 L     6 Minute Oxygen Saturation % 92 %     6 Minute Liters of Oxygen 0 L     2 Minute Post Oxygen Saturation % 98 %     2 Minute Post Liters of Oxygen 0 L        Oxygen Initial Assessment:  Oxygen Initial Assessment - 05/02/24 1039       Home Oxygen    Home Oxygen Device None    Sleep Oxygen Prescription CPAP    Home Exercise Oxygen Prescription None    Home Resting Oxygen Prescription None      Initial 6 min Walk   Oxygen Used None      Program Oxygen Prescription   Program Oxygen Prescription None      Intervention   Short Term Goals To learn and understand importance of maintaining oxygen saturations>88%;To learn and demonstrate proper use of respiratory medications;To learn and understand importance of monitoring SPO2 with pulse oximeter and demonstrate accurate use of the pulse oximeter.;To learn and demonstrate proper pursed lip breathing techniques or other breathing techniques.     Long  Term Goals Maintenance of O2 saturations>88%;Compliance with respiratory medication;Verbalizes importance of monitoring SPO2 with pulse oximeter and return demonstration;Exhibits proper breathing techniques, such as pursed lip breathing or other method taught during program session;Demonstrates proper use of MDIs          Oxygen Re-Evaluation:   Oxygen Discharge (Final Oxygen Re-Evaluation):   Initial Exercise Prescription:  Initial Exercise Prescription - 05/02/24 1100       Date of Initial Exercise RX and Referring Provider   Date 05/02/24    Referring Provider Dr. Tor    Expected Discharge Date 07/24/24      Treadmill   MPH 1.9    Grade 0    Minutes 15    METs 2.45      Bike   Level 2    Minutes 15    METs 3      Prescription Details   Frequency (times per week) 2    Duration Progress to 30 minutes of continuous aerobic without signs/symptoms of physical distress      Intensity   THRR 40-80% of Max Heartrate 57-114    Ratings of Perceived Exertion 11-13    Perceived Dyspnea 0-4      Progression   Progression Continue to progress workloads to maintain intensity without signs/symptoms of physical distress.      Resistance Training   Training Prescription Yes    Weight blue bands    Reps 10-15           Perform Capillary Blood Glucose checks as needed.  Exercise Prescription Changes:   Exercise Comments:   Exercise Goals and Review:   Exercise Goals     Row Name 05/02/24 1031             Exercise Goals   Increase Physical Activity Yes       Intervention Provide advice, education, support and counseling about physical activity/exercise needs.       Expected Outcomes Short Term: Attend rehab on a regular basis to increase amount of physical activity.;Long Term: Exercising regularly at least 3-5 days a week.;Long Term: Add in home exercise to make exercise part of routine and to increase amount of physical activity.       Increase Strength and Stamina Yes  Intervention Provide advice, education, support and counseling about physical activity/exercise needs.;Develop an individualized exercise prescription for aerobic and resistive training based on initial evaluation findings, risk stratification, comorbidities and participant's personal goals.       Expected Outcomes Short Term: Increase workloads from initial exercise prescription for resistance, speed, and METs.;Short Term: Perform resistance training exercises routinely during rehab and add in resistance training at home;Long Term: Improve cardiorespiratory fitness, muscular endurance and strength as measured by increased METs and functional capacity ( )       Able to understand and use rate of perceived exertion (RPE) scale Yes       Intervention Provide education and explanation on how to use RPE scale       Expected Outcomes Short Term: Able to use RPE daily in rehab to express subjective intensity level;Long Term:  Able to use RPE to guide intensity level when exercising independently       Able to understand and use Dyspnea scale Yes       Intervention Provide education and explanation on how to use Dyspnea scale       Expected Outcomes Short Term: Able to use Dyspnea scale daily in rehab to express subjective sense  of shortness of breath during exertion;Long Term: Able to use Dyspnea scale to guide intensity level when exercising independently       Knowledge and understanding of Target Heart Rate Range (THRR) Yes       Intervention Provide education and explanation of THRR including how the numbers were predicted and where they are located for reference       Expected Outcomes Short Term: Able to state/look up THRR;Long Term: Able to use THRR to govern intensity when exercising independently;Short Term: Able to use daily as guideline for intensity in rehab       Understanding of Exercise Prescription Yes       Intervention Provide education, explanation, and written materials on patient's individual exercise prescription       Expected Outcomes Short Term: Able to explain program exercise prescription;Long Term: Able to explain home exercise prescription to exercise independently          Exercise Goals Re-Evaluation :   Discharge Exercise Prescription (Final Exercise Prescription Changes):   Nutrition:  Target Goals: Understanding of nutrition guidelines, daily intake of sodium 1500mg , cholesterol 200mg , calories 30% from fat and 7% or less from saturated fats, daily to have 5 or more servings of fruits and vegetables.  Biometrics:  Pre Biometrics - 05/02/24 1130       Pre Biometrics   Grip Strength 18 kg           Nutrition Therapy Plan and Nutrition Goals:   Nutrition Assessments:  MEDIFICTS Score Key: >=70 Need to make dietary changes  40-70 Heart Healthy Diet <= 40 Therapeutic Level Cholesterol Diet   Picture Your Plate Scores: <59 Unhealthy dietary pattern with much room for improvement. 41-50 Dietary pattern unlikely to meet recommendations for good health and room for improvement. 51-60 More healthful dietary pattern, with some room for improvement.  >60 Healthy dietary pattern, although there may be some specific behaviors that could be improved.    Nutrition Goals  Re-Evaluation:   Nutrition Goals Discharge (Final Nutrition Goals Re-Evaluation):   Psychosocial: Target Goals: Acknowledge presence or absence of significant depression and/or stress, maximize coping skills, provide positive support system. Participant is able to verbalize types and ability to use techniques and skills needed for reducing stress and depression.  Initial Review &  Psychosocial Screening:  Initial Psych Review & Screening - 05/02/24 1036       Initial Review   Current issues with History of Depression;Current Psychotropic Meds;Current Stress Concerns    Source of Stress Concerns Chronic Illness    Comments Pt states his breathing is the only thing that brings him stress      Family Dynamics   Good Support System? Yes    Comments supportive wife      Barriers   Psychosocial barriers to participate in program The patient should benefit from training in stress management and relaxation.;Psychosocial barriers identified (see note)      Screening Interventions   Interventions Encouraged to exercise;Provide feedback about the scores to participant;To provide support and resources with identified psychosocial needs    Expected Outcomes Short Term goal: Utilizing psychosocial counselor, staff and physician to assist with identification of specific Stressors or current issues interfering with healing process. Setting desired goal for each stressor or current issue identified.;Long Term Goal: Stressors or current issues are controlled or eliminated.;Short Term goal: Identification and review with participant of any Quality of Life or Depression concerns found by scoring the questionnaire.;Long Term goal: The participant improves quality of Life and PHQ9 Scores as seen by post scores and/or verbalization of changes          Quality of Life Scores:  Scores of 19 and below usually indicate a poorer quality of life in these areas.  A difference of  2-3 points is a clinically  meaningful difference.  A difference of 2-3 points in the total score of the Quality of Life Index has been associated with significant improvement in overall quality of life, self-image, physical symptoms, and general health in studies assessing change in quality of life.  PHQ-9: Review Flowsheet       05/02/2024 07/18/2023 11/19/2017  Depression screen PHQ 2/9  Decreased Interest 0 2 3 3   Down, Depressed, Hopeless 0 0 3 3  PHQ - 2 Score 0 2 6 6   Altered sleeping 1 1 3 3   Tired, decreased energy 1 1 3 3   Change in appetite 0 0 0 0  Feeling bad or failure about yourself  0 0 3 3  Trouble concentrating 0 0 3 3  Moving slowly or fidgety/restless 0 0 3 3  Suicidal thoughts 0 0 0 0  PHQ-9 Score 2 4  21  21    Difficult doing work/chores Not difficult at all Not difficult at all Extremely dIfficult Very difficult    Details       Data saved with a previous flowsheet row definition   Multiple values from one day are sorted in reverse-chronological order        Interpretation of Total Score  Total Score Depression Severity:  1-4 = Minimal depression, 5-9 = Mild depression, 10-14 = Moderate depression, 15-19 = Moderately severe depression, 20-27 = Severe depression   Psychosocial Evaluation and Intervention:  Psychosocial Evaluation - 05/02/24 1127       Psychosocial Evaluation & Interventions   Interventions Encouraged to exercise with the program and follow exercise prescription;Relaxation education;Stress management education    Comments Anibal states he feels his mental health is stable at this time. He does endorse feeling stressed about his difficulty with breathing. He is compliant with taking his psychotropic meds. He has good support from his wife.    Expected Outcomes For Alann's mental health to continue to be stable throughout the program and have less stress    Continue  Psychosocial Services  Follow up required by staff          Psychosocial Re-Evaluation:   Psychosocial  Discharge (Final Psychosocial Re-Evaluation):   Education: Education Goals: Education classes will be provided on a weekly basis, covering required topics. Participant will state understanding/return demonstration of topics presented.  Learning Barriers/Preferences:  Learning Barriers/Preferences - 05/02/24 1037       Learning Barriers/Preferences   Learning Barriers Sight    Learning Preferences Group Instruction;Verbal Instruction;Written Material          Education Topics: Know Your Numbers Group instruction that is supported by a PowerPoint presentation. Instructor discusses importance of knowing and understanding resting, exercise, and post-exercise oxygen saturation, heart rate, and blood pressure. Oxygen saturation, heart rate, blood pressure, rating of perceived exertion, and dyspnea are reviewed along with a normal range for these values.  Flowsheet Row PULMONARY REHAB CHRONIC OBSTRUCTIVE PULMONARY DISEASE from 07/26/2023 in [facility]  Date 07/26/23  Educator EP  Instruction Review Code 1- Verbalizes Understanding    Exercise for the Pulmonary Patient Group instruction that is supported by a PowerPoint presentation. Instructor discusses benefits of exercise, core components of exercise, frequency, duration, and intensity of an exercise routine, importance of utilizing pulse oximetry during exercise, safety while exercising, and options of places to exercise outside of rehab.  Flowsheet Row PULMONARY REHAB CHRONIC OBSTRUCTIVE PULMONARY DISEASE from 10/11/2023 in [facility], [facility]  Date 10/11/23  Educator EP  Instruction Review Code 1- Verbalizes Understanding    MET Level  Group instruction provided by PowerPoint, verbal discussion, and written material to support subject matter. Instructor reviews what METs are and how to increase METs.  Flowsheet Row PULMONARY  REHAB CHRONIC OBSTRUCTIVE PULMONARY DISEASE from 09/13/2023 in [facility], [facility] Lung [facility]  Date 09/13/23  Educator EP  Instruction Review Code 1- Verbalizes Understanding    Pulmonary Medications Verbally interactive group education provided by instructor with focus on inhaled medications and proper administration. Flowsheet Row PULMONARY REHAB CHRONIC OBSTRUCTIVE PULMONARY DISEASE from 10/04/2023 in [facility]  Date 10/04/23  Educator RT  Instruction Review Code 1- Verbalizes Understanding    Anatomy and Physiology of the Respiratory System Group instruction provided by PowerPoint, verbal discussion, and written material to support subject matter. Instructor reviews respiratory cycle and anatomical components of the respiratory system and their functions. Instructor also reviews differences in obstructive and restrictive respiratory diseases with examples of each.  Flowsheet Row PULMONARY REHAB CHRONIC OBSTRUCTIVE PULMONARY DISEASE from 09/27/2023 in [facility], [facility] Health  Date 09/27/23  Educator RT  Instruction Review Code 1- Verbalizes Understanding    Oxygen Safety Group instruction provided by PowerPoint, verbal discussion, and written material to support subject matter. There is an overview of What is Oxygen and Why do we need it.  Instructor also reviews how to create a safe environment for oxygen use, the importance of using oxygen as prescribed, and the risks of noncompliance. There is a brief discussion on traveling with oxygen and resources the patient may utilize.   Oxygen Use Group instruction provided by PowerPoint, verbal discussion, and written material to discuss how supplemental oxygen is prescribed and different types of oxygen supply systems. Resources for more information are provided.  Flowsheet Row PULMONARY REHAB  CHRONIC OBSTRUCTIVE PULMONARY DISEASE from 08/09/2023  in [facility] for [facility], Vascular, & Lung Health  Date 08/09/23  Educator RT  Instruction Review Code 1- Verbalizes Understanding    Breathing Techniques Group instruction that is supported by demonstration and informational handouts. Instructor discusses the benefits of pursed lip and diaphragmatic breathing and detailed demonstration on how to perform both.     Risk Factor Reduction Group instruction that is supported by a PowerPoint presentation. Instructor discusses the definition of a risk factor, different risk factors for pulmonary disease, and how the heart and lungs work together. Flowsheet Row PULMONARY REHAB CHRONIC OBSTRUCTIVE PULMONARY DISEASE from 09/06/2023 in [facility]  Date 09/06/23  Educator EP  Instruction Review Code 1- Verbalizes Understanding    Pulmonary Diseases Group instruction provided by PowerPoint, verbal discussion, and written material to support subject matter. Instructor gives an overview of the different type of pulmonary diseases. There is also a discussion on risk factors and symptoms as well as ways to manage the diseases.   Stress and Energy Conservation Group instruction provided by PowerPoint, verbal discussion, and written material to support subject matter. Instructor gives an overview of stress and the impact it can have on the body. Instructor also reviews ways to reduce stress. There is also a discussion on energy conservation and ways to conserve energy throughout the day. Flowsheet Row PULMONARY REHAB CHRONIC OBSTRUCTIVE PULMONARY DISEASE from 08/23/2023 in [facility] for Heart, Vascular, [facility] Lung Health  Date 08/23/23  Educator RN  Instruction Review Code 1- Verbalizes Understanding    Warning Signs and Symptoms Group instruction provided by PowerPoint, verbal discussion, and  written material to support subject matter. Instructor reviews warning signs and symptoms of stroke, heart attack, cold and flu. Instructor also reviews ways to prevent the spread of infection.   Other Education Group or individual verbal, written, or video instructions that support the educational goals of the pulmonary rehab program.    Knowledge Questionnaire Score:  Knowledge Questionnaire Score - 05/02/24 1032       Knowledge Questionnaire Score   Pre Score 15/18          Core Components/Risk Factors/Patient Goals at Admission:  Personal Goals and Risk Factors at Admission - 05/02/24 1038       Core Components/Risk Factors/Patient Goals on Admission    Weight Management Weight Loss;Yes    Intervention Weight Management: Develop a combined nutrition and exercise program designed to reach desired caloric intake, while maintaining appropriate intake of nutrient and fiber, sodium and fats, and appropriate energy expenditure required for the weight goal.;Weight Management: Provide education and appropriate resources to help participant work on and attain dietary goals.;Weight Management/Obesity: Establish reasonable short term and long term weight goals.;Obesity: Provide education and appropriate resources to help participant work on and attain dietary goals.    Admit Weight 195 lb 1.7 oz (88.5 kg)    Expected Outcomes Short Term: Continue to assess and modify interventions until short term weight is achieved;Long Term: Adherence to nutrition and physical activity/exercise program aimed toward attainment of established weight goal;Weight Loss: Understanding of general recommendations for a balanced deficit meal plan, which promotes 1-2 lb weight loss per week and includes a negative energy balance of [phone] kcal/d;Understanding recommendations for meals to include 15-35% energy as protein, 25-35% energy from fat, 35-60% energy from carbohydrates, less than 200mg  of dietary cholesterol,  20-35 gm of total fiber daily;Understanding of distribution of calorie intake throughout the day  with the consumption of 4-5 meals/snacks    Improve shortness of breath with ADL's Yes    Intervention Provide education, individualized exercise plan and daily activity instruction to help decrease symptoms of SOB with activities of daily living.    Expected Outcomes Short Term: Improve cardiorespiratory fitness to achieve a reduction of symptoms when performing ADLs;Long Term: Be able to perform more ADLs without symptoms or delay the onset of symptoms          Core Components/Risk Factors/Patient Goals Review:    Core Components/Risk Factors/Patient Goals at Discharge (Final Review):    ITP Comments: Dr. Slater Staff is Medical Director for Pulmonary Rehab at [facility].         [1]  Current Outpatient Medications:    Acetaminophen  (TYLENOL  PO), Take 2 tablets by mouth daily as needed (pain)., Disp: , Rfl:    Albuterol  Sulfate (PROAIR  RESPICLICK) 108 (90 Base) MCG/ACT AEPB, Inhale into the lungs., Disp: , Rfl:    amLODipine (NORVASC) 5 MG tablet, Take 5 mg by mouth every morning., Disp: , Rfl:    aspirin  81 MG tablet, Take 81 mg by mouth daily., Disp: , Rfl:    azelastine (ASTELIN) 0.1 % nasal spray, Place 2 sprays into the nose 2 (two) times daily., Disp: , Rfl:    busPIRone (BUSPAR) 15 MG tablet, Take 15 mg by mouth 3 (three) times daily., Disp: , Rfl:    escitalopram (LEXAPRO) 10 MG tablet, Take 10 mg by mouth daily., Disp: , Rfl:    fluticasone  furoate-vilanterol (BREO ELLIPTA ) 100-25 MCG/INH AEPB, Inhale 1 puff into the lungs daily., Disp: 1 each, Rfl: 5   galantamine (RAZADYNE ER) 8 MG 24 hr capsule, Take 8 mg by mouth daily with breakfast., Disp: , Rfl:    ibuprofen  (ADVIL ) 600 MG tablet, Take 600 mg by mouth every 8 (eight) hours as needed for moderate pain (pain score 4-6)., Disp: , Rfl:    latanoprost (XALATAN) 0.005 % ophthalmic solution, Place 1 drop into both  eyes at bedtime., Disp: , Rfl:    meclizine  (ANTIVERT ) 25 MG tablet, Take 12.5 mg by mouth 3 (three) times daily as needed for dizziness., Disp: , Rfl:    memantine (NAMENDA) 10 MG tablet, Take 20 mg by mouth 2 (two) times daily., Disp: , Rfl:    mometasone (ASMANEX) 220 MCG/ACT inhaler, Inhale 1 puff into the lungs daily., Disp: , Rfl:    pramipexole (MIRAPEX) 0.125 MG tablet, Take 0.125 mg by mouth 4 (four) times daily. SABRA125 mg three times daily and at bedtime, Disp: , Rfl:    ropinirole (REQUIP) 5 MG tablet, Take 1 tablet by mouth 3 (three) times daily. 5 mg twice daily and 10 mg at bedtime, Disp: , Rfl:    rosuvastatin  (CRESTOR ) 10 MG tablet, Take 10 mg by mouth daily., Disp: , Rfl:    tamsulosin (FLOMAX) 0.4 MG CAPS capsule, Take 0.8 mg by mouth at bedtime. , Disp: , Rfl:    temazepam (RESTORIL) 7.5 MG capsule, Take 7.5 mg by mouth at bedtime as needed for sleep., Disp: , Rfl:    TIOTROPIUM BROMIDE -OLODATEROL IN, Inhale 2.5 mcg into the lungs daily. 2 puffs/daily, Disp: , Rfl:    vitamin B-12 (CYANOCOBALAMIN) 500 MCG tablet, Take 500 mcg by mouth 2 (two) times daily., Disp: , Rfl:    alprostadil (EDEX) 20 MCG injection, by Intracavernosal route., Disp: , Rfl:    magnesium  oxide (MAG-OX) 400 (240 Mg) MG tablet, Take 420 mg by mouth 2 (  two) times daily. Take for leg cramping, Disp: , Rfl:    Misc Natural Products (PROSTATE HEALTH) CAPS, Take 1 capsule by mouth daily., Disp: , Rfl:    Multiple Vitamins-Minerals (EYE HEALTH PO), Take 1 tablet by mouth daily., Disp: , Rfl:    traZODone (DESYREL) 50 MG tablet, Take 50 mg by mouth at bedtime., Disp: , Rfl:    valproic acid (DEPAKENE) 250 MG capsule, Take 750 mg by mouth at bedtime., Disp: , Rfl:    vardenafil (LEVITRA) 20 MG tablet, Take 20 mg by mouth daily as needed for erectile dysfunction., Disp: , Rfl:  [2]  Social History Tobacco Use  Smoking Status Former   Current packs/day: 0.00   Average packs/day: 1 pack/day for 5.0 years (5.0 ttl  pk-yrs)   Types: Cigarettes   Start date: 04/24/1962   Quit date: 04/25/1967   Years since quitting: 57.0  Smokeless Tobacco Never

## 2024-05-08 ENCOUNTER — Encounter (HOSPITAL_COMMUNITY)
Admission: RE | Admit: 2024-05-08 | Discharge: 2024-05-08 | Disposition: A | Source: Ambulatory Visit | Attending: Internal Medicine

## 2024-05-08 DIAGNOSIS — J449 Chronic obstructive pulmonary disease, unspecified: Secondary | ICD-10-CM

## 2024-05-08 NOTE — Progress Notes (Signed)
 Daily Session Note  Patient Details  Name: Matthew Patrick MRN: [id number] Date of Birth: [birth year] Referring Provider:   Flowsheet Row PULMONARY REHAB COPD ORIENTATION from 05/02/2024 in [facility]  Referring Provider Dr. Tor    Encounter Date: 05/08/2024  Check In:  Session Check In - 05/08/24 1332       Check-In   Supervising physician immediately available to respond to emergencies [facility] MD immediately available    Physician(s) Lum Louis, NP    Location [facility]    Staff Present Ronal Levin, RN, BSN;Casey Claudene, Neita Moats, MS, ACSM-CEP, Exercise Physiologist;Randi Midge BS, ACSM-CEP, Exercise Physiologist    Virtual Visit No    Medication changes reported     No    Fall or balance concerns reported    No    Tobacco Cessation No Change    Warm-up and Cool-down Performed as group-led instruction    Resistance Training Performed Yes    VAD Patient? No    PAD/SET Patient? No      Pain Assessment   Currently in Pain? No/denies    Multiple Pain Sites No          Capillary Blood Glucose: No results found for this or any previous visit (from the past 24 hours).    Tobacco Use History[1]  Goals Met:  Exercise tolerated well Queuing for purse lip breathing No report of concerns or symptoms today Strength training completed today  Goals Unmet:  Not Applicable  Comments: Service time is from 1307 to 1440    Dr. Slater Staff is Medical Director for Pulmonary Rehab at [facility].     [1]  Social History Tobacco Use  Smoking Status Former   Current packs/day: 0.00   Average packs/day: 1 pack/day for 5.0 years (5.0 ttl pk-yrs)   Types: Cigarettes   Start date: 04/24/1962   Quit date: 04/25/1967   Years since quitting: 57.0  Smokeless Tobacco Never

## 2024-05-13 ENCOUNTER — Encounter (HOSPITAL_COMMUNITY)
Admission: RE | Admit: 2024-05-13 | Discharge: 2024-05-13 | Disposition: A | Source: Ambulatory Visit | Attending: Internal Medicine

## 2024-05-13 VITALS — Wt 198.9 lb

## 2024-05-13 DIAGNOSIS — J449 Chronic obstructive pulmonary disease, unspecified: Secondary | ICD-10-CM

## 2024-05-13 NOTE — Progress Notes (Signed)
 Daily Session Note  Patient Details  Name: Matthew Patrick MRN: [id number] Date of Birth: 1946/03/05 Referring Provider:   Flowsheet Row PULMONARY REHAB COPD ORIENTATION from 05/02/2024 in [facility]  Referring Provider Dr. Tor    Encounter Date: 05/13/2024  Check In:  Session Check In - 05/13/24 1320       Check-In   Supervising physician immediately available to respond to emergencies [facility] MD immediately available    Physician(s) Lum Louis, NP    Location [facility]    Staff Present Ronal Levin, RN, BSN;Casey Claudene, Neita Moats, MS, ACSM-CEP, Exercise Physiologist;Randi Midge BS, ACSM-CEP, Exercise Physiologist    Virtual Visit No    Medication changes reported     No    Fall or balance concerns reported    No    Tobacco Cessation No Change    Warm-up and Cool-down Performed as group-led instruction    Resistance Training Performed Yes    VAD Patient? No    PAD/SET Patient? No      Pain Assessment   Currently in Pain? No/denies    Multiple Pain Sites No          Capillary Blood Glucose: No results found for this or any previous visit (from the past 24 hours).   Exercise Prescription Changes - 05/13/24 1400       Response to Exercise   Blood Pressure (Admit) 128/64    Blood Pressure (Exercise) 136/62    Blood Pressure (Exit) 130/60    Heart Rate (Admit) 68 bpm    Heart Rate (Exercise) 104 bpm    Heart Rate (Exit) 83 bpm    Oxygen Saturation (Admit) 95 %    Oxygen Saturation (Exercise) 92 %    Oxygen Saturation (Exit) 94 %    Rating of Perceived Exertion (Exercise) 11    Perceived Dyspnea (Exercise) 2    Duration Continue with 30 min of aerobic exercise without signs/symptoms of physical distress.    Intensity THRR unchanged      Progression   Progression Continue to progress workloads to maintain intensity without signs/symptoms of physical distress.      Resistance  Training   Weight blue bands    Reps 10-15    Time 10 Minutes      Treadmill   MPH 1.9    Grade 1    Minutes 15    METs 2.5      Bike   Level 2    Minutes 15    METs 2.9          Tobacco Use History[1]  Goals Met:  Proper associated with RPD/PD & O2 Sat Independence with exercise equipment Exercise tolerated well No report of concerns or symptoms today Strength training completed today  Goals Unmet:  Not Applicable  Comments: Service time is from 1309 to 1433.    Dr. Slater Staff is Medical Director for Pulmonary Rehab at [facility].     [1]  Social History Tobacco Use  Smoking Status Former   Current packs/day: 0.00   Average packs/day: 1 pack/day for 5.0 years (5.0 ttl pk-yrs)   Types: Cigarettes   Start date: 04/24/1962   Quit date: 04/25/1967   Years since quitting: 57.0  Smokeless Tobacco Never

## 2024-05-14 NOTE — Progress Notes (Signed)
 Pulmonary Individual Treatment Plan  Patient Details  Name: Matthew Patrick MRN: [id number] Date of Birth: [birth year] Referring Provider:   Flowsheet Row PULMONARY REHAB COPD ORIENTATION from 05/02/2024 in [facility]  Referring Provider Dr. Tor    Initial Encounter Date:  Flowsheet Row PULMONARY REHAB COPD ORIENTATION from 05/02/2024 in [facility], Vascular, & Lung [facility]  Date 05/02/24    Visit Diagnosis: Stage 2 moderate COPD by GOLD classification (HCC)  Patient's Home Medications on Admission:  Current Medications[1]  Past Medical History: Past Medical History:  Diagnosis Date   Abnormal findings on diagnostic imaging of lung    Accidental exposure to phenoxyacid derivative herbicide    Actinic keratosis    Allergic rhinitis    Anxiety    Asthma    Benign prostatic hyperplasia    Bipolar affective disorder, mixed, severe, with psychotic behavior (HCC)    Cognitive impairment    Depression    Drug induced akathisia    Dysuria    Exposure to potentially hazardous substance    Functional diarrhea    Hyperlipemia    Hypertension    Insomnia    Lumbago 06/27/2016   Major depressive disorder    Parkinson disease (HCC)    Restless leg syndrome    Sleep apnea    has c-pap    Tremor due to disorder of central nervous system    Vascular dementia without behavioral disturbance (HCC)     Tobacco Use: Tobacco Use History[2]  Labs: Review Flowsheet       Latest Ref Rng & Units 03/26/2011 09/17/2015 04/10/2016 01/11/2017  Labs for ITP Cardiac and Pulmonary Rehab  Cholestrol 0 - 200 mg/dL 875  873  826  - -  LDL (calc) 0 - 99 mg/dL 43  46  87  - -  HDL-C >40 mg/dL 45  44  58  - -  Trlycerides <150 mg/dL 818  821  859  - -  Hemoglobin A1c 4.0 - 6.0 % 5.7  5.7  - -  TCO2 22 - 32 mmol/L - - 28  27     Details       Multiple values from one day are sorted in reverse-chronological  order         Capillary Blood Glucose: Lab Results  Component Value Date   GLUCAP 89 06/07/2021   GLUCAP 79 04/29/2018   GLUCAP 90 01/11/2017     Pulmonary Assessment Scores:  Pulmonary Assessment Scores     Row Name 05/02/24 1033         ADL UCSD   ADL Phase Entry     SOB Score total 8       CAT Score   CAT Score 8       mMRC Score   mMRC Score 1       UCSD: Self-administered rating of dyspnea associated with activities of daily living (ADLs) 6-point scale (0 = not at all to 5 = maximal or unable to do because of breathlessness)  Scoring Scores range from 0 to 120.  Minimally important difference is 5 units  CAT: CAT can identify the health impairment of COPD patients and is better correlated with disease progression.  CAT has a scoring range of zero to 40. The CAT score is classified into four groups of low (less than 10), medium (10 - 20), high (21-30) and very high (31-40) based on the impact level  of disease on health status. A CAT score over 10 suggests significant symptoms.  A worsening CAT score could be explained by an exacerbation, poor medication adherence, poor inhaler technique, or progression of COPD or comorbid conditions.  CAT MCID is 2 points  mMRC: mMRC (Modified Medical Research Council) Dyspnea Scale is used to assess the degree of baseline functional disability in patients of respiratory disease due to dyspnea. No minimal important difference is established. A decrease in score of 1 point or greater is considered a positive change.   Pulmonary Function Assessment:  Pulmonary Function Assessment - 05/02/24 1039       Breath   Bilateral Breath Sounds Clear    Shortness of Breath Yes;Limiting activity          Exercise Target Goals: Exercise Program Goal: Individual exercise prescription set using results from initial 6 min walk test and THRR while considering  patients activity barriers and safety.   Exercise Prescription  Goal: Initial exercise prescription builds to 30-45 minutes a day of aerobic activity, 2-3 days per week.  Home exercise guidelines will be given to patient during program as part of exercise prescription that the participant will acknowledge.  Activity Barriers & Risk Stratification:  Activity Barriers & Cardiac Risk Stratification - 05/02/24 1039       Activity Barriers & Cardiac Risk Stratification   Activity Barriers Muscular Weakness;Shortness of Breath;Deconditioning    Cardiac Risk Stratification Moderate          6 Minute Walk:  6 Minute Walk     Row Name 05/02/24 1117         6 Minute Walk   Phase Initial     Distance 1294 feet     Walk Time 6 minutes     # of Rest Breaks 0     MPH 2.45     METS 2.72     RPE 6     Perceived Dyspnea  0     VO2 Peak 9.52     Symptoms No     Resting HR 65 bpm     Resting BP 132/58     Resting Oxygen Saturation  94 %     Exercise Oxygen Saturation  during 6 min walk 91 %     Max Ex. HR 99 bpm     Max Ex. BP 166/60     2 Minute Post BP 138/60       Interval HR   1 Minute HR 86     2 Minute HR 95     3 Minute HR 99     4 Minute HR 92     5 Minute HR 91     6 Minute HR 90     2 Minute Post HR 71     Interval Heart Rate? Yes       Interval Oxygen   Interval Oxygen? Yes     Baseline Oxygen Saturation % 94 %     1 Minute Oxygen Saturation % 94 %     1 Minute Liters of Oxygen 0 L     2 Minute Oxygen Saturation % 92 %     2 Minute Liters of Oxygen 0 L     3 Minute Oxygen Saturation % 91 %     3 Minute Liters of Oxygen 0 L     4 Minute Oxygen Saturation % 93 %     4 Minute Liters of Oxygen 0 L     5  Minute Oxygen Saturation % 93 %     5 Minute Liters of Oxygen 0 L     6 Minute Oxygen Saturation % 92 %     6 Minute Liters of Oxygen 0 L     2 Minute Post Oxygen Saturation % 98 %     2 Minute Post Liters of Oxygen 0 L        Oxygen Initial Assessment:  Oxygen Initial Assessment - 05/02/24 1039       Home Oxygen    Home Oxygen Device None    Sleep Oxygen Prescription CPAP    Home Exercise Oxygen Prescription None    Home Resting Oxygen Prescription None      Initial 6 min Walk   Oxygen Used None      Program Oxygen Prescription   Program Oxygen Prescription None      Intervention   Short Term Goals To learn and understand importance of maintaining oxygen saturations>88%;To learn and demonstrate proper use of respiratory medications;To learn and understand importance of monitoring SPO2 with pulse oximeter and demonstrate accurate use of the pulse oximeter.;To learn and demonstrate proper pursed lip breathing techniques or other breathing techniques.     Long  Term Goals Maintenance of O2 saturations>88%;Compliance with respiratory medication;Verbalizes importance of monitoring SPO2 with pulse oximeter and return demonstration;Exhibits proper breathing techniques, such as pursed lip breathing or other method taught during program session;Demonstrates proper use of MDIs          Oxygen Re-Evaluation:  Oxygen Re-Evaluation     Row Name 05/05/24 1331             Program Oxygen Prescription   Program Oxygen Prescription None         Home Oxygen   Home Oxygen Device None       Sleep Oxygen Prescription CPAP       Home Exercise Oxygen Prescription None       Home Resting Oxygen Prescription None         Goals/Expected Outcomes   Short Term Goals To learn and understand importance of maintaining oxygen saturations>88%;To learn and demonstrate proper use of respiratory medications;To learn and understand importance of monitoring SPO2 with pulse oximeter and demonstrate accurate use of the pulse oximeter.;To learn and demonstrate proper pursed lip breathing techniques or other breathing techniques.        Long  Term Goals Maintenance of O2 saturations>88%;Compliance with respiratory medication;Verbalizes importance of monitoring SPO2 with pulse oximeter and return demonstration;Exhibits proper  breathing techniques, such as pursed lip breathing or other method taught during program session;Demonstrates proper use of MDIs       Goals/Expected Outcomes Compliance and understanding of oxygen saturation monitoring and breathing techniques to decrease shortness of breath.          Oxygen Discharge (Final Oxygen Re-Evaluation):  Oxygen Re-Evaluation - 05/05/24 1331       Program Oxygen Prescription   Program Oxygen Prescription None      Home Oxygen   Home Oxygen Device None    Sleep Oxygen Prescription CPAP    Home Exercise Oxygen Prescription None    Home Resting Oxygen Prescription None      Goals/Expected Outcomes   Short Term Goals To learn and understand importance of maintaining oxygen saturations>88%;To learn and demonstrate proper use of respiratory medications;To learn and understand importance of monitoring SPO2 with pulse oximeter and demonstrate accurate use of the pulse oximeter.;To learn and demonstrate proper pursed lip breathing techniques or other  breathing techniques.     Long  Term Goals Maintenance of O2 saturations>88%;Compliance with respiratory medication;Verbalizes importance of monitoring SPO2 with pulse oximeter and return demonstration;Exhibits proper breathing techniques, such as pursed lip breathing or other method taught during program session;Demonstrates proper use of MDIs    Goals/Expected Outcomes Compliance and understanding of oxygen saturation monitoring and breathing techniques to decrease shortness of breath.          Initial Exercise Prescription:  Initial Exercise Prescription - 05/02/24 1100       Date of Initial Exercise RX and Referring Provider   Date 05/02/24    Referring Provider Dr. Tor    Expected Discharge Date 07/24/24      Treadmill   MPH 1.9    Grade 0    Minutes 15    METs 2.45      Bike   Level 2    Minutes 15    METs 3      Prescription Details   Frequency (times per week) 2    Duration Progress to 30  minutes of continuous aerobic without signs/symptoms of physical distress      Intensity   THRR 40-80% of Max Heartrate 57-114    Ratings of Perceived Exertion 11-13    Perceived Dyspnea 0-4      Progression   Progression Continue to progress workloads to maintain intensity without signs/symptoms of physical distress.      Resistance Training   Training Prescription Yes    Weight blue bands    Reps 10-15          Perform Capillary Blood Glucose checks as needed.  Exercise Prescription Changes:   Exercise Prescription Changes     Row Name 05/13/24 1400             Response to Exercise   Blood Pressure (Admit) 128/64       Blood Pressure (Exercise) 136/62       Blood Pressure (Exit) 130/60       Heart Rate (Admit) 68 bpm       Heart Rate (Exercise) 104 bpm       Heart Rate (Exit) 83 bpm       Oxygen Saturation (Admit) 95 %       Oxygen Saturation (Exercise) 92 %       Oxygen Saturation (Exit) 94 %       Rating of Perceived Exertion (Exercise) 11       Perceived Dyspnea (Exercise) 2       Duration Continue with 30 min of aerobic exercise without signs/symptoms of physical distress.       Intensity THRR unchanged         Progression   Progression Continue to progress workloads to maintain intensity without signs/symptoms of physical distress.         Resistance Training   Weight blue bands       Reps 10-15       Time 10 Minutes         Treadmill   MPH 1.9       Grade 1       Minutes 15       METs 2.5         Bike   Level 2       Minutes 15       METs 2.9          Exercise Comments:   Exercise Goals and Review:   Exercise Goals  Row Name 05/02/24 1031             Exercise Goals   Increase Physical Activity Yes       Intervention Provide advice, education, support and counseling about physical activity/exercise needs.       Expected Outcomes Short Term: Attend rehab on a regular basis to increase amount of physical activity.;Long Term:  Exercising regularly at least 3-5 days a week.;Long Term: Add in home exercise to make exercise part of routine and to increase amount of physical activity.       Increase Strength and Stamina Yes       Intervention Provide advice, education, support and counseling about physical activity/exercise needs.;Develop an individualized exercise prescription for aerobic and resistive training based on initial evaluation findings, risk stratification, comorbidities and participant's personal goals.       Expected Outcomes Short Term: Increase workloads from initial exercise prescription for resistance, speed, and METs.;Short Term: Perform resistance training exercises routinely during rehab and add in resistance training at home;Long Term: Improve cardiorespiratory fitness, muscular endurance and strength as measured by increased METs and functional capacity ( )       Able to understand and use rate of perceived exertion (RPE) scale Yes       Intervention Provide education and explanation on how to use RPE scale       Expected Outcomes Short Term: Able to use RPE daily in rehab to express subjective intensity level;Long Term:  Able to use RPE to guide intensity level when exercising independently       Able to understand and use Dyspnea scale Yes       Intervention Provide education and explanation on how to use Dyspnea scale       Expected Outcomes Short Term: Able to use Dyspnea scale daily in rehab to express subjective sense of shortness of breath during exertion;Long Term: Able to use Dyspnea scale to guide intensity level when exercising independently       Knowledge and understanding of Target Heart Rate Range (THRR) Yes       Intervention Provide education and explanation of THRR including how the numbers were predicted and where they are located for reference       Expected Outcomes Short Term: Able to state/look up THRR;Long Term: Able to use THRR to govern intensity when exercising  independently;Short Term: Able to use daily as guideline for intensity in rehab       Understanding of Exercise Prescription Yes       Intervention Provide education, explanation, and written materials on patient's individual exercise prescription       Expected Outcomes Short Term: Able to explain program exercise prescription;Long Term: Able to explain home exercise prescription to exercise independently          Exercise Goals Re-Evaluation :  Exercise Goals Re-Evaluation     Row Name 05/05/24 1331             Exercise Goal Re-Evaluation   Exercise Goals Review Increase Physical Activity;Able to understand and use Dyspnea scale;Understanding of Exercise Prescription;Increase Strength and Stamina;Knowledge and understanding of Target Heart Rate Range (THRR);Able to understand and use rate of perceived exertion (RPE) scale       Comments Pt to begin exercise 1/15. Will progress as tolerated.       Expected Outcomes Through exercise at rehab and at home, patient will increase physical capacity and ADL's will be easier to perform. The patient will also feel comfortable establishing a home  exercise program.          Discharge Exercise Prescription (Final Exercise Prescription Changes):  Exercise Prescription Changes - 05/13/24 1400       Response to Exercise   Blood Pressure (Admit) 128/64    Blood Pressure (Exercise) 136/62    Blood Pressure (Exit) 130/60    Heart Rate (Admit) 68 bpm    Heart Rate (Exercise) 104 bpm    Heart Rate (Exit) 83 bpm    Oxygen Saturation (Admit) 95 %    Oxygen Saturation (Exercise) 92 %    Oxygen Saturation (Exit) 94 %    Rating of Perceived Exertion (Exercise) 11    Perceived Dyspnea (Exercise) 2    Duration Continue with 30 min of aerobic exercise without signs/symptoms of physical distress.    Intensity THRR unchanged      Progression   Progression Continue to progress workloads to maintain intensity without signs/symptoms of physical distress.       Resistance Training   Weight blue bands    Reps 10-15    Time 10 Minutes      Treadmill   MPH 1.9    Grade 1    Minutes 15    METs 2.5      Bike   Level 2    Minutes 15    METs 2.9          Nutrition:  Target Goals: Understanding of nutrition guidelines, daily intake of sodium 1500mg , cholesterol 200mg , calories 30% from fat and 7% or less from saturated fats, daily to have 5 or more servings of fruits and vegetables.  Biometrics:  Pre Biometrics - 05/02/24 1130       Pre Biometrics   Grip Strength 18 kg           Nutrition Therapy Plan and Nutrition Goals:  Nutrition Therapy & Goals - 05/08/24 1428       Nutrition Therapy   Diet General Healthy Diet      Personal Nutrition Goals   Nutrition Goal Patient to improve diet quality by using the plate method as a guide for meal planning to include lean protein/plant protein, fruits, vegetables, whole grains, nonfat dairy as part of a well-balanced diet.    Personal Goal #2 Patient to identify strategies for weight loss of 0.5-2.0# per week.    Comments Patient with medical history significant for HTN, hyperlipidemia, OSA, COPD. Pt verbalizes desire to lose ~ 20 lbs; questions effectiveness of fasting. Wt gain of 11.1 lb (6%) noted over past 3 months. Pt reports restricting bread, potatoes and sodas in effort to lose weight. RD discussed importance of adequate calories to help maintain muscle mass. Discussed creating appropriate calorie deficit by filling majority of plate with plant-based foods; reviewed portion plate. Also encouraged pt to increase protein intake to ~ 25 g per meal to help maintain muscle mass, manage hunger. RD provided health protein suggestions.      Intervention Plan   Intervention Prescribe, educate and counsel regarding individualized specific dietary modifications aiming towards targeted core components such as weight, hypertension, lipid management, diabetes, heart failure and other  comorbidities.;Nutrition handout(s) given to patient.   Handouts: My Plate Planner, Smart Tips to Power Up with Breakfast, Smart Tips for Successful Meals, Weight Loss Considerations   Expected Outcomes Short Term Goal: Understand basic principles of dietary content, such as calories, fat, sodium, cholesterol and nutrients.;Long Term Goal: Adherence to prescribed nutrition plan.          Nutrition Assessments:  MEDIFICTS Score Key: >=70 Need to make dietary changes  40-70 Heart Healthy Diet <= 40 Therapeutic Level Cholesterol Diet  Flowsheet Row PULMONARY REHAB CHRONIC OBSTRUCTIVE PULMONARY DISEASE from 05/08/2024 in [facility]  Picture Your Plate Total Score on Admission 69   Picture Your Plate Scores: <59 Unhealthy dietary pattern with much room for improvement. 41-50 Dietary pattern unlikely to meet recommendations for good health and room for improvement. 51-60 More healthful dietary pattern, with some room for improvement.  >60 Healthy dietary pattern, although there may be some specific behaviors that could be improved.    Nutrition Goals Re-Evaluation:   Nutrition Goals Discharge (Final Nutrition Goals Re-Evaluation):   Psychosocial: Target Goals: Acknowledge presence or absence of significant depression and/or stress, maximize coping skills, provide positive support system. Participant is able to verbalize types and ability to use techniques and skills needed for reducing stress and depression.  Initial Review & Psychosocial Screening:  Initial Psych Review & Screening - 05/02/24 1036       Initial Review   Current issues with History of Depression;Current Psychotropic Meds;Current Stress Concerns    Source of Stress Concerns Chronic Illness    Comments Pt states his breathing is the only thing that brings him stress      Family Dynamics   Good Support System? Yes    Comments supportive wife      Barriers    Psychosocial barriers to participate in program The patient should benefit from training in stress management and relaxation.;Psychosocial barriers identified (see note)      Screening Interventions   Interventions Encouraged to exercise;Provide feedback about the scores to participant;To provide support and resources with identified psychosocial needs    Expected Outcomes Short Term goal: Utilizing psychosocial counselor, staff and physician to assist with identification of specific Stressors or current issues interfering with healing process. Setting desired goal for each stressor or current issue identified.;Long Term Goal: Stressors or current issues are controlled or eliminated.;Short Term goal: Identification and review with participant of any Quality of Life or Depression concerns found by scoring the questionnaire.;Long Term goal: The participant improves quality of Life and PHQ9 Scores as seen by post scores and/or verbalization of changes          Quality of Life Scores:  Scores of 19 and below usually indicate a poorer quality of life in these areas.  A difference of  2-3 points is a clinically meaningful difference.  A difference of 2-3 points in the total score of the Quality of Life Index has been associated with significant improvement in overall quality of life, self-image, physical symptoms, and general health in studies assessing change in quality of life.  PHQ-9: Review Flowsheet       05/02/2024 07/18/2023 11/19/2017  Depression screen PHQ 2/9  Decreased Interest 0 2 3 3   Down, Depressed, Hopeless 0 0 3 3  PHQ - 2 Score 0 2 6 6   Altered sleeping 1 1 3 3   Tired, decreased energy 1 1 3 3   Change in appetite 0 0 0 0  Feeling bad or failure about yourself  0 0 3 3  Trouble concentrating 0 0 3 3  Moving slowly or fidgety/restless 0 0 3 3  Suicidal thoughts 0 0 0 0  PHQ-9 Score 2 4  21  21    Difficult doing work/chores Not difficult at all Not difficult at all Extremely  dIfficult Very difficult    Details  Data saved with a previous flowsheet row definition   Multiple values from one day are sorted in reverse-chronological order        Interpretation of Total Score  Total Score Depression Severity:  1-4 = Minimal depression, 5-9 = Mild depression, 10-14 = Moderate depression, 15-19 = Moderately severe depression, 20-27 = Severe depression   Psychosocial Evaluation and Intervention:  Psychosocial Evaluation - 05/02/24 1127       Psychosocial Evaluation & Interventions   Interventions Encouraged to exercise with the program and follow exercise prescription;Relaxation education;Stress management education    Comments Ulysee states he feels his mental health is stable at this time. He does endorse feeling stressed about his difficulty with breathing. He is compliant with taking his psychotropic meds. He has good support from his wife.    Expected Outcomes For Chigozie's mental health to continue to be stable throughout the program and have less stress    Continue Psychosocial Services  Follow up required by staff          Psychosocial Re-Evaluation:  Psychosocial Re-Evaluation     Row Name 05/05/24 0929             Psychosocial Re-Evaluation   Current issues with History of Depression;Current Psychotropic Meds;Current Stress Concerns       Comments 30 day psy/soc re-eval: No changes since orientation. Kalid is scheduled to start the program this Thursday.       Expected Outcomes For [place] to have decreased symptoms of stress related to his breathing. For his anxiety and depression to stay well managed. [facility] and [facility] to [facility] his [facility]s.       Interventions Encouraged to attend Pulmonary Rehabilitation for the exercise       Continue Psychosocial Services  Follow up required by staff          Psychosocial Discharge (Final Psychosocial Re-Evaluation):  Psychosocial Re-Evaluation - 05/05/24 0929        Psychosocial Re-Evaluation   Current issues with History of Depression;Current Psychotropic Meds;Current Stress Concerns    Comments 30 day psy/soc re-eval: No changes since orientation. Caelin is scheduled to start the program this Thursday.    Expected Outcomes For [place] to have decreased symptoms of stress related to his breathing. For his anxiety and depression to stay well managed. [facility] and continue to take his psychotropic meds.    Interventions Encouraged to attend Pulmonary Rehabilitation for the exercise    Continue Psychosocial Services  Follow up required by staff          Education: Education Goals: Education classes will be provided on a weekly basis, covering required topics. Participant will state understanding/return demonstration of topics presented.  Learning Barriers/Preferences:  Learning Barriers/Preferences - 05/02/24 1037       Learning Barriers/Preferences   Learning Barriers Sight    Learning Preferences Group Instruction;Verbal Instruction;Written Material          Education Topics: Know [employer] Group instruction that is supported by a PowerPoint presentation. Instructor discusses importance of knowing and understanding resting, exercise, and post-exercise oxygen saturation, heart rate, and blood pressure. Oxygen saturation, heart rate, blood pressure, rating of perceived exertion, and dyspnea are reviewed along with a normal range for these values.  Flowsheet Row PULMONARY REHAB CHRONIC OBSTRUCTIVE PULMONARY DISEASE from 07/26/2023 in [facility]  Date 07/26/23  Educator EP  Instruction Review Code 1- Verbalizes  Understanding    Exercise for the Pulmonary Patient Group instruction that is supported by a PowerPoint presentation. Instructor discusses benefits of exercise, core components of exercise, frequency, duration, and intensity of an exercise routine, importance of  utilizing pulse oximetry during exercise, safety while exercising, and options of places to exercise outside of rehab.  Flowsheet Row PULMONARY REHAB CHRONIC OBSTRUCTIVE PULMONARY DISEASE from 10/11/2023 in [facility], V[facility], [facility]  Date 10/11/23  Educator EP  Instruction Review Code 1- Verbalizes Understanding    MET Level  Group instruction provided by PowerPoint, verbal discussion, and written material to support subject matter. Instructor reviews what METs are and how to increase METs.  Flowsheet Row PULMONARY REHAB CHRONIC OBSTRUCTIVE PULMONARY DISEASE from 09/13/2023 in [facility] for Heart, Vascular, & Lung [facility]  Date 09/13/23  Educator EP  Instruction Review Code 1- Verbalizes Understanding    Pulmonary Medications Verbally interactive group education provided by instructor with focus on inhaled medications and proper administration. Flowsheet Row PULMONARY REHAB CHRONIC OBSTRUCTIVE PULMONARY DISEASE from 10/04/2023 in [facility], Vascular, & [facility]  Date 10/04/23  Educator RT  Instruction Review Code 1- Verbalizes Understanding    Anatomy and Physiology of the Respiratory System Group instruction provided by PowerPoint, verbal discussion, and written material to support subject matter. Instructor reviews respiratory cycle and anatomical components of the respiratory system and their functions. Instructor also reviews differences in obstructive and restrictive respiratory diseases with examples of each.  Flowsheet Row PULMONARY REHAB CHRONIC OBSTRUCTIVE PULMONARY DISEASE from 09/27/2023 in [facility] for [facility], Vascular, & Lung [facility]  Date 09/27/23  Educator RT  Instruction Review Code 1- Verbalizes Understanding    Oxygen Safety Group instruction provided by PowerPoint, verbal discussion, and written material to support subject matter. There is  an overview of What is Oxygen and Why do we need it.  Instructor also reviews how to create a safe environment for oxygen use, the importance of using oxygen as prescribed, and the risks of noncompliance. There is a brief discussion on traveling with oxygen and resources the patient may utilize.   Oxygen Use Group instruction provided by PowerPoint, verbal discussion, and written material to discuss how supplemental oxygen is prescribed and different types of oxygen supply systems. Resources for more information are provided.  Flowsheet Row PULMONARY REHAB CHRONIC OBSTRUCTIVE PULMONARY DISEASE from 05/08/2024 in [facility], Vascular, & [facility]  Date 05/08/24  Educator RT  Instruction Review Code 1- Verbalizes Understanding    Breathing Techniques Group instruction that is supported by demonstration and informational handouts. Instructor discusses the benefits of pursed lip and diaphragmatic breathing and detailed demonstration on how to perform both.     Risk Factor Reduction Group instruction that is supported by a PowerPoint presentation. Instructor discusses the definition of a risk factor, different risk factors for pulmonary disease, and how the heart and lungs work together. Flowsheet Row PULMONARY REHAB CHRONIC OBSTRUCTIVE PULMONARY DISEASE from 09/06/2023 in [facility] for [facility], Vascular, & Lung [facility]  Date 09/06/23  Educator EP  Instruction Review Code 1- Verbalizes Understanding    Pulmonary Diseases Group instruction provided by PowerPoint, verbal discussion, and written material to support subject matter. Instructor gives an overview of the different type of pulmonary diseases. There is also a discussion on risk factors and symptoms as well as ways to manage the diseases.  Stress and Energy Conservation Group instruction provided by PowerPoint, verbal discussion, and written material to support subject  matter. Instructor gives an overview of stress and the impact it can have on the body. Instructor also reviews ways to reduce stress. There is also a discussion on energy conservation and ways to conserve energy throughout the day. Flowsheet Row PULMONARY REHAB CHRONIC OBSTRUCTIVE PULMONARY DISEASE from 08/23/2023 in [facility] for [facility], Vascular, & Lung [facility]  Date 08/23/23  Educator RN  Instruction Review Code 1- Verbalizes Understanding    Warning Signs and Symptoms Group instruction provided by PowerPoint, verbal discussion, and written material to support subject matter. Instructor reviews warning signs and symptoms of stroke, heart attack, cold and flu. Instructor also reviews ways to prevent the spread of infection.   Other Education Group or individual verbal, written, or video instructions that support the educational goals of the pulmonary rehab program.    Knowledge Questionnaire Score:  Knowledge Questionnaire Score - 05/02/24 1032       Knowledge Questionnaire Score   Pre Score 15/18          Core Components/Risk Factors/Patient Goals at Admission:  Personal Goals and Risk Factors at Admission - 05/02/24 1038       Core Components/Risk Factors/Patient Goals on Admission    Weight Management Weight Loss;Yes    Intervention Weight Management: Develop a combined nutrition and exercise program designed to reach desired caloric intake, while maintaining appropriate intake of nutrient and fiber, sodium and fats, and appropriate energy expenditure required for the weight goal.;Weight Management: Provide education and appropriate resources to help participant work on and attain dietary goals.;Weight Management/Obesity: Establish reasonable short term and long term weight goals.;Obesity: Provide education and appropriate resources to help participant work on and attain dietary goals.    Admit Weight 195 lb 1.7 oz (88.5 kg)    Expected Outcomes Short  Term: Continue to assess and modify interventions until short term weight is achieved;Long Term: Adherence to nutrition and physical activity/exercise program aimed toward attainment of established weight goal;Weight Loss: Understanding of general recommendations for a balanced deficit meal plan, which promotes 1-2 lb weight loss per week and includes a negative energy balance of [phone] kcal/d;Understanding recommendations for meals to include 15-35% energy as protein, 25-35% energy from fat, 35-60% energy from carbohydrates, less than 200mg  of dietary cholesterol, 20-35 gm of total fiber daily;Understanding of distribution of calorie intake throughout the day with the consumption of 4-5 meals/snacks    Improve shortness of breath with ADL's Yes    Intervention Provide education, individualized exercise plan and daily activity instruction to help decrease symptoms of SOB with activities of daily living.    Expected Outcomes Short Term: Improve cardiorespiratory fitness to achieve a reduction of symptoms when performing ADLs;Long Term: Be able to perform more ADLs without symptoms or delay the onset of symptoms          Core Components/Risk Factors/Patient Goals Review:   Goals and Risk Factor Review     Row Name 05/05/24 0932             Core Components/Risk Factors/Patient Goals Review   Personal Goals Review Weight Management/Obesity;Improve shortness of breath with ADL's;Develop more efficient breathing techniques such as purse lipped breathing and diaphragmatic breathing and practicing self-pacing with activity.       Review Monthly core components/risk factors/patient goals 30 day re-evaluate: Unable to assess goals yet. Kara is scheduled to start the program this Thursday.  Expected Outcomes Pt will show progress toward meeting expected goals and outcomes          Core Components/Risk Factors/Patient Goals at Discharge (Final Review):   Goals and Risk Factor Review - 05/05/24  0932       Core Components/Risk Factors/Patient Goals Review   Personal Goals Review Weight Management/Obesity;Improve shortness of breath with ADL's;Develop more efficient breathing techniques such as purse lipped breathing and diaphragmatic breathing and practicing self-pacing with activity.    Review Monthly core components/risk factors/patient goals 30 day re-evaluate: Unable to assess goals yet. Branden is scheduled to start the program this Thursday.    Expected Outcomes Pt will show progress toward meeting expected goals and outcomes          ITP Comments:  Pt is making expected progress toward Pulmonary Rehab goals after completing 2 session(s). Recommend continued exercise, life style modification, education, and utilization of breathing techniques to increase stamina and strength, while also decreasing shortness of breath with exertion.  Dr. Slater Staff is Medical Director for Pulmonary Rehab at [facility].   Comments:      [1]  Current Outpatient Medications:    Acetaminophen  (TYLENOL  PO), Take 2 tablets by mouth daily as needed (pain)., Disp: , Rfl:    Albuterol  Sulfate (PROAIR  RESPICLICK) 108 (90 Base) MCG/ACT AEPB, Inhale into the lungs., Disp: , Rfl:    alprostadil (EDEX) 20 MCG injection, by Intracavernosal route., Disp: , Rfl:    amLODipine (NORVASC) 5 MG tablet, Take 5 mg by mouth every morning., Disp: , Rfl:    aspirin  81 MG tablet, Take 81 mg by mouth daily., Disp: , Rfl:    azelastine (ASTELIN) 0.1 % nasal spray, Place 2 sprays into the nose 2 (two) times daily., Disp: , Rfl:    busPIRone (BUSPAR) 15 MG tablet, Take 15 mg by mouth 3 (three) times daily., Disp: , Rfl:    escitalopram (LEXAPRO) 10 MG tablet, Take 10 mg by mouth daily., Disp: , Rfl:    fluticasone  furoate-vilanterol (BREO ELLIPTA ) 100-25 MCG/INH AEPB, Inhale 1 puff into the lungs daily., Disp: 1 each, Rfl: 5   galantamine (RAZADYNE ER) 8 MG 24 hr capsule, Take 8 mg by mouth daily with  breakfast., Disp: , Rfl:    ibuprofen  (ADVIL ) 600 MG tablet, Take 600 mg by mouth every 8 (eight) hours as needed for moderate pain (pain score 4-6)., Disp: , Rfl:    latanoprost (XALATAN) 0.005 % ophthalmic solution, Place 1 drop into both eyes at bedtime., Disp: , Rfl:    magnesium  oxide (MAG-OX) 400 (240 Mg) MG tablet, Take 420 mg by mouth 2 (two) times daily. Take for leg cramping, Disp: , Rfl:    meclizine  (ANTIVERT ) 25 MG tablet, Take 12.5 mg by mouth 3 (three) times daily as needed for dizziness., Disp: , Rfl:    memantine (NAMENDA) 10 MG tablet, Take 20 mg by mouth 2 (two) times daily., Disp: , Rfl:    Misc Natural Products (PROSTATE HEALTH) CAPS, Take 1 capsule by mouth daily., Disp: , Rfl:    mometasone (ASMANEX) 220 MCG/ACT inhaler, Inhale 1 puff into the lungs daily., Disp: , Rfl:    Multiple Vitamins-Minerals (EYE HEALTH PO), Take 1 tablet by mouth daily., Disp: , Rfl:    pramipexole (MIRAPEX) 0.125 MG tablet, Take 0.125 mg by mouth 4 (four) times daily. SABRA125 mg three times daily and at bedtime, Disp: , Rfl:    ropinirole (REQUIP) 5 MG tablet, Take 1 tablet by mouth 3 (  three) times daily. 5 mg twice daily and 10 mg at bedtime, Disp: , Rfl:    rosuvastatin  (CRESTOR ) 10 MG tablet, Take 10 mg by mouth daily., Disp: , Rfl:    tamsulosin (FLOMAX) 0.4 MG CAPS capsule, Take 0.8 mg by mouth at bedtime. , Disp: , Rfl:    temazepam (RESTORIL) 7.5 MG capsule, Take 7.5 mg by mouth at bedtime as needed for sleep., Disp: , Rfl:    TIOTROPIUM BROMIDE -OLODATEROL IN, Inhale 2.5 mcg into the lungs daily. 2 puffs/daily, Disp: , Rfl:    traZODone (DESYREL) 50 MG tablet, Take 50 mg by mouth at bedtime., Disp: , Rfl:    valproic acid (DEPAKENE) 250 MG capsule, Take 750 mg by mouth at bedtime., Disp: , Rfl:    vardenafil (LEVITRA) 20 MG tablet, Take 20 mg by mouth daily as needed for erectile dysfunction., Disp: , Rfl:    vitamin B-12 (CYANOCOBALAMIN) 500 MCG tablet, Take 500 mcg by mouth 2 (two) times  daily., Disp: , Rfl:  [2]  Social History Tobacco Use  Smoking Status Former   Current packs/day: 0.00   Average packs/day: 1 pack/day for 5.0 years (5.0 ttl pk-yrs)   Types: Cigarettes   Start date: 04/24/1962   Quit date: 04/25/1967   Years since quitting: 57.0  Smokeless Tobacco Never

## 2024-05-15 ENCOUNTER — Encounter (HOSPITAL_COMMUNITY)

## 2024-05-20 ENCOUNTER — Encounter (HOSPITAL_COMMUNITY)

## 2024-05-22 ENCOUNTER — Telehealth (HOSPITAL_COMMUNITY): Payer: Self-pay

## 2024-05-22 ENCOUNTER — Encounter (HOSPITAL_COMMUNITY)

## 2024-05-22 ENCOUNTER — Telehealth (HOSPITAL_COMMUNITY): Payer: Self-pay | Admitting: *Deleted

## 2024-05-22 NOTE — Telephone Encounter (Signed)
 Pt's wife called reporting Orlyn is sick with the flu. He had already visited the emergency room. She has seen his SpO2 89 RA. Encouraged her to continue to monitor him and call his MD if he is feeling poorly. He is on an antiviral.  Aliene Aris BS, ACSM-CEP 05/22/2024 12:35 PM

## 2024-05-22 NOTE — Telephone Encounter (Signed)
 Called patient to let him know that elevator is out of Order.  Advised to use the top level of parking deck that will put them on 2nd level and use stairway to 3rd floor.

## 2024-05-27 ENCOUNTER — Encounter (HOSPITAL_COMMUNITY)

## 2024-05-27 ENCOUNTER — Telehealth (HOSPITAL_COMMUNITY): Payer: Self-pay | Admitting: *Deleted

## 2024-05-29 ENCOUNTER — Encounter (HOSPITAL_COMMUNITY)

## 2024-06-03 ENCOUNTER — Encounter (HOSPITAL_COMMUNITY)

## 2024-06-05 ENCOUNTER — Encounter (HOSPITAL_COMMUNITY)

## 2024-06-10 ENCOUNTER — Encounter (HOSPITAL_COMMUNITY)

## 2024-06-12 ENCOUNTER — Encounter (HOSPITAL_COMMUNITY)

## 2024-06-17 ENCOUNTER — Encounter (HOSPITAL_COMMUNITY)

## 2024-06-19 ENCOUNTER — Encounter (HOSPITAL_COMMUNITY)

## 2024-06-24 ENCOUNTER — Encounter (HOSPITAL_COMMUNITY)

## 2024-06-26 ENCOUNTER — Encounter (HOSPITAL_COMMUNITY)

## 2024-07-01 ENCOUNTER — Encounter (HOSPITAL_COMMUNITY)

## 2024-07-03 ENCOUNTER — Encounter (HOSPITAL_COMMUNITY)

## 2024-07-08 ENCOUNTER — Encounter (HOSPITAL_COMMUNITY)

## 2024-07-10 ENCOUNTER — Encounter (HOSPITAL_COMMUNITY)

## 2024-07-15 ENCOUNTER — Encounter (HOSPITAL_COMMUNITY)

## 2024-07-17 ENCOUNTER — Encounter (HOSPITAL_COMMUNITY)

## 2024-07-22 ENCOUNTER — Encounter (HOSPITAL_COMMUNITY)

## 2024-07-24 ENCOUNTER — Encounter (HOSPITAL_COMMUNITY)
